# Patient Record
Sex: Female | Born: 1945 | Race: White | Hispanic: Yes | Smoking: Never smoker
Health system: Southern US, Community
[De-identification: ages and names within clinical notes are randomized; demographics above are authoritative.]

## PROBLEM LIST (undated history)

## (undated) DIAGNOSIS — M25559 Pain in unspecified hip: Secondary | ICD-10-CM

## (undated) DIAGNOSIS — G4730 Sleep apnea, unspecified: Secondary | ICD-10-CM

## (undated) DIAGNOSIS — Z972 Presence of dental prosthetic device (complete) (partial): Secondary | ICD-10-CM

## (undated) DIAGNOSIS — Z8489 Family history of other specified conditions: Secondary | ICD-10-CM

## (undated) DIAGNOSIS — G8929 Other chronic pain: Secondary | ICD-10-CM

## (undated) DIAGNOSIS — R002 Palpitations: Secondary | ICD-10-CM

## (undated) DIAGNOSIS — Z973 Presence of spectacles and contact lenses: Secondary | ICD-10-CM

## (undated) DIAGNOSIS — J45909 Unspecified asthma, uncomplicated: Secondary | ICD-10-CM

## (undated) DIAGNOSIS — I480 Paroxysmal atrial fibrillation: Secondary | ICD-10-CM

## (undated) DIAGNOSIS — E785 Hyperlipidemia, unspecified: Secondary | ICD-10-CM

## (undated) DIAGNOSIS — G629 Polyneuropathy, unspecified: Secondary | ICD-10-CM

## (undated) DIAGNOSIS — F32A Depression, unspecified: Secondary | ICD-10-CM

## (undated) DIAGNOSIS — G510 Bell's palsy: Secondary | ICD-10-CM

## (undated) DIAGNOSIS — F419 Anxiety disorder, unspecified: Secondary | ICD-10-CM

## (undated) DIAGNOSIS — M1990 Unspecified osteoarthritis, unspecified site: Secondary | ICD-10-CM

## (undated) DIAGNOSIS — I10 Essential (primary) hypertension: Secondary | ICD-10-CM

## (undated) DIAGNOSIS — T7840XA Allergy, unspecified, initial encounter: Secondary | ICD-10-CM

## (undated) DIAGNOSIS — M549 Dorsalgia, unspecified: Secondary | ICD-10-CM

## (undated) DIAGNOSIS — H269 Unspecified cataract: Secondary | ICD-10-CM

## (undated) HISTORY — DX: Palpitations: R00.2

## (undated) HISTORY — DX: Unspecified asthma, uncomplicated: J45.909

## (undated) HISTORY — DX: Other chronic pain: G89.29

## (undated) HISTORY — DX: Anxiety disorder, unspecified: F41.9

## (undated) HISTORY — PX: TOOTH EXTRACTION: SUR596

## (undated) HISTORY — DX: Essential (primary) hypertension: I10

## (undated) HISTORY — DX: Pain in unspecified hip: M25.559

## (undated) HISTORY — DX: Depression, unspecified: F32.A

## (undated) HISTORY — PX: JOINT REPLACEMENT: SHX530

## (undated) HISTORY — DX: Unspecified cataract: H26.9

## (undated) HISTORY — DX: Allergy, unspecified, initial encounter: T78.40XA

## (undated) HISTORY — DX: Unspecified osteoarthritis, unspecified site: M19.90

## (undated) HISTORY — PX: CHOLECYSTECTOMY: SHX55

## (undated) HISTORY — DX: Dorsalgia, unspecified: M54.9

## (undated) HISTORY — DX: Sleep apnea, unspecified: G47.30

## (undated) HISTORY — DX: Bell's palsy: G51.0

---

## 2008-08-12 ENCOUNTER — Emergency Department: Payer: Self-pay | Admitting: Emergency Medicine

## 2010-04-18 ENCOUNTER — Encounter: Payer: Self-pay | Admitting: Family Medicine

## 2010-04-18 ENCOUNTER — Ambulatory Visit: Payer: BC Managed Care – PPO | Admitting: Family Medicine

## 2010-04-18 DIAGNOSIS — I10 Essential (primary) hypertension: Secondary | ICD-10-CM

## 2010-04-18 DIAGNOSIS — M1990 Unspecified osteoarthritis, unspecified site: Secondary | ICD-10-CM | POA: Insufficient documentation

## 2010-04-18 DIAGNOSIS — M25569 Pain in unspecified knee: Secondary | ICD-10-CM | POA: Insufficient documentation

## 2010-04-18 DIAGNOSIS — M25669 Stiffness of unspecified knee, not elsewhere classified: Secondary | ICD-10-CM

## 2010-04-19 ENCOUNTER — Encounter: Payer: Self-pay | Admitting: Family Medicine

## 2010-04-19 ENCOUNTER — Ambulatory Visit: Payer: BC Managed Care – PPO | Admitting: Family Medicine

## 2010-04-19 ENCOUNTER — Encounter (INDEPENDENT_AMBULATORY_CARE_PROVIDER_SITE_OTHER): Payer: Self-pay | Admitting: *Deleted

## 2010-04-19 DIAGNOSIS — I10 Essential (primary) hypertension: Secondary | ICD-10-CM

## 2010-04-19 DIAGNOSIS — M1990 Unspecified osteoarthritis, unspecified site: Secondary | ICD-10-CM

## 2010-04-21 ENCOUNTER — Encounter: Payer: Self-pay | Admitting: Family Medicine

## 2010-04-21 LAB — CONVERTED CEMR LAB
ALT: 16 U/L
AST: 24 U/L
Albumin: 4.2 g/dL
Alkaline Phosphatase: 80 U/L
BUN: 23 mg/dL
Basophils Absolute: 0 K/uL
Basophils Relative: 1 %
CO2: 29 meq/L
Calcium: 9.6 mg/dL
Chloride: 100 meq/L
Creatinine, Ser: 0.75 mg/dL
Eosinophils Absolute: 0.2 K/uL
Eosinophils Relative: 4 %
Glucose, Bld: 90 mg/dL
HCT: 39.1 %
Hemoglobin: 13.1 g/dL
Lymphocytes Relative: 36 %
Lymphs Abs: 1.6 K/uL
MCHC: 33.5 g/dL
MCV: 88.5 fL
Monocytes Absolute: 0.4 K/uL
Monocytes Relative: 9 %
Neutro Abs: 2.3 K/uL
Neutrophils Relative %: 51 %
Platelets: 189 K/uL
Potassium: 4.9 meq/L
RBC: 4.42 M/uL
RDW: 13.2 %
Sodium: 137 meq/L
Total Bilirubin: 0.9 mg/dL
Total Protein: 7.1 g/dL
Vit D, 25-Hydroxy: 17 ng/mL — ABNORMAL LOW
WBC: 4.4 10*3/microliter

## 2010-04-22 ENCOUNTER — Encounter: Payer: Self-pay | Admitting: Family Medicine

## 2010-04-23 NOTE — Assessment & Plan Note (Signed)
Summary: FOLLOW UP HTN / BLOOD WORK   Vital Signs:  Patient profile:   65 year old female Height:      62 inches Weight:      166 pounds BMI:     30.47 Pulse rate:   64 / minute BP sitting:   136 / 82  (right arm) Cuff size:   regular  Vitals Entered By: Haze Boyden, CMA (April 19, 2010 11:50 AM) History of Present Illness History from: patient Reason for visit: follow up of multiple problems Chief Complaint: Follow Up for BP and to get blood work History of Present Illness: The patient presented today because I had asked her to come back in today to have her blood pressure rechecked and to have her blood work done.  The patient says that she has been taking her BP meds and she says that she has been tolerating them well.  No side effects so far. She would like to have her flu vaccine and she will need to get her TDAP vaccine as well.  She says that she is having lower back pain and neck pain.  She is otherwise not having any other symptoms.  No CP, no SOB, no weakness in extremities, no rash.      Current Medications (verified): 1)  Lisinopril-Hydrochlorothiazide 10-12.5 Mg Tabs (Lisinopril-Hydrochlorothiazide) .... Take 1 By Mouth Daily For Blood Pressure 2)  Daily Multiple Vitamins  Tabs (Multiple Vitamin) .... Take One Tablet By Mouth Once Daily 3)  Vitamin C 500 Mg Tabs (Ascorbic Acid) .... Take One Tablet By Mouth Once Daily  Allergies (verified): 1)  ! * Fish  Past History:  Family History: Last updated: 04/18/2010 Hypertension present in both parents and diabetes mellitus. Her father is deceased. She has a brother dad is deceased from diabetes mellitus and a sister also deceased from diabetes mellitus.  Social History: Last updated: 04/18/2010 The patient reports that she does not use tobacco, alcohol or recreational drugs. She reports that she cares for her daughter and granddaughter in [place].   Past Medical History: Reviewed history from  04/18/2010 and no changes required. Unremarkable per patient  Past Surgical History: Reviewed history from 04/18/2010 and no changes required. Denies surgical history  Family History: Reviewed history from 04/18/2010 and no changes required. Hypertension present in both parents and diabetes mellitus. Her father is deceased. She has a brother dad is deceased from diabetes mellitus and a sister also deceased from diabetes mellitus.  Social History: Reviewed history from 04/18/2010 and no changes required. The patient reports that she does not use tobacco, alcohol or recreational drugs. She reports that she cares for her daughter and granddaughter in [place].   Review of Systems General:  Denies chills, fatigue, fever, loss of appetite, malaise, sleep disorder, sweats, weakness, and weight loss. Eyes:  Denies blurring, discharge, double vision, eye irritation, eye pain, halos, itching, light sensitivity, red eye, vision loss-1 eye, and vision loss-both eyes. ENT:  Denies decreased hearing, difficulty swallowing, ear discharge, earache, hoarseness, nasal congestion, nosebleeds, postnasal drainage, ringing in ears, sinus pressure, and sore throat. CV:  Denies bluish discoloration of lips or nails, chest pain or discomfort, difficulty breathing at night, difficulty breathing while lying down, fainting, fatigue, leg cramps with exertion, lightheadness, near fainting, palpitations, shortness of breath with exertion, swelling of feet, swelling of hands, and weight gain. Resp:  Denies chest discomfort, chest pain with inspiration, cough, coughing up blood, excessive snoring, hypersomnolence, morning headaches, pleuritic, shortness of  breath, sputum productive, and wheezing. GI:  Denies abdominal pain, bloody stools, change in bowel habits, constipation, dark tarry stools, diarrhea, excessive appetite, gas, hemorrhoids, indigestion, loss of appetite, nausea, vomiting, vomiting blood, and  yellowish skin color. GU:  Denies abnormal vaginal bleeding, decreased libido, discharge, dysuria, genital sores, hematuria, incontinence, nocturia, urinary frequency, and urinary hesitancy. MS:  Complains of low back pain and stiffness; denies joint pain, joint redness, joint swelling, loss of strength, mid back pain, muscle aches, muscle , cramps, muscle weakness, and thoracic pain. Neuro:  Denies brief paralysis, difficulty with concentration, disturbances in coordination, falling down, headaches, inability to speak, memory loss, numbness, poor balance, seizures, sensation of room spinning, tingling, tremors, visual disturbances, and weakness. Psych:  Denies alternate hallucination ( auditory/visual), anxiety, depression, easily angered, easily tearful, irritability, mental problems, panic attacks, sense of great danger, suicidal thoughts/plans, thoughts of violence, unusual visions or sounds, and thoughts /plans of harming others.  Physical Exam  General:  Well-developed,well-nourished,in no acute distress; alert,appropriate and cooperative throughout examination Head:  Normocephalic and atraumatic without obvious abnormalities. No apparent alopecia or balding. Eyes:  No corneal or conjunctival inflammation noted. EOMI. Perrla. Funduscopic exam benign, without hemorrhages, exudates or papilledema. Vision grossly normal. Nose:  External nasal examination shows no deformity or inflammation. Nasal mucosa are pink and moist without lesions or exudates. Mouth:  Oral mucosa and oropharynx without lesions or exudates.  Teeth in good repair. Neck:  No deformities, masses, or tenderness noted.  Thoracic Kyphosis Lungs:  Normal respiratory effort, chest expands symmetrically. Lungs are clear to auscultation, no crackles or wheezes. Heart:  Grade 1/6 systolic ejection murmur.  normal rate, regular rhythm, no gallop, no rub, no JVD, and Grade   /6 systolic ejection murmur.   Abdomen:  Bowel sounds  positive,abdomen soft and non-tender without masses, organomegaly or hernias noted. Msk:  Thoracic Kyphosis Pulses:  R and L carotid,radial,femoral,dorsalis pedis and posterior tibial pulses are full and equal bilaterally Extremities:  No clubbing, cyanosis, edema, or deformity noted with normal full range of motion of all joints.   Neurologic:  No cranial nerve deficits noted. Station and gait are normal. Plantar reflexes are down-going bilaterally. DTRs are symmetrical throughout. Sensory, motor and coordinative functions appear intact. Psych:  Cognition and judgment appear intact. Alert and cooperative with normal attention span and concentration. No apparent delusions, illusions, hallucinations   Impression & Recommendations:  Problem # 1:  UNSPECIFIED ESSENTIAL HYPERTENSION (ICD-401.9)  Her updated medication list for this problem includes:    Lisinopril-hydrochlorothiazide 10-12.5 Mg Tabs (Lisinopril-hydrochlorothiazide) .Marland Kitchen... Take 1 by mouth daily for blood pressure  BP is improving on medications.  The patient will get labs drawn today including BMP, GFR (est), CBC, Lipid Panel, UA DIP  Problem # 2:  Health Maintenance Will arrange for bone density study Will arrange for cardiology referral TdAP and FLU vaccine given today. Will follow up on the results.    Rodney Langton, MD, CDE, FAAFP   Complete Medication List: 1)  Lisinopril-hydrochlorothiazide 10-12.5 Mg Tabs (Lisinopril-hydrochlorothiazide) .... Take 1 by mouth daily for blood pressure 2)  Daily Multiple Vitamins Tabs (Multiple vitamin) .... Take one tablet by mouth once daily 3)  Vitamin C 500 Mg Tabs (Ascorbic acid) .... Take one tablet by mouth once daily  Other Orders: Tdap => 50yrs IM (04540) Admin 1st Vaccine (98119)  Patient Instructions: 1)  Check your Blood Pressure regularly. If it is above: 140/90 you should make an appointment. 2)  Return in 2 months for a  recheck.  3)  The patient was informed that there  is no on-call provider or services available at this clinic during off-hours (when the clinic is closed).  If the patient developed a problem or concern that required immediate attention, the patient was advised to go the the nearest available urgent care or emergency department for medical care.  The patient verbalized understanding.    4)  The risks, benefits and possible side effects were clearly explained and discussed with the patient.  The patient verbalized clear understanding.  The patient was given instructions to return if symptoms don't improve, worsen or new changes develop.  If it is not during clinic hours and the patient cannot get back to this clinic then the patient was told to seek medical care at an available urgent care or emergency department.  The patient verbalized understanding.   5)  Return or go to the ER if no improvement or symptoms getting worse.     Orders Added: 1)  Tdap => 26yrs IM [90715] 2)  Admin 1st Vaccine [90471]   Immunizations Administered:  Tetanus Vaccine:    Vaccine Type: Tdap    Site: right deltoid    Mfr: Sanofi Pasteur    Dose: 0.5 ml    Route: IM    Given by: Haze Boyden, CMA    Exp. Date: 06/14/2011    Lot #: [id number]  Influenza Vaccine:    Vaccine Type: FLULAVAL    Site: left deltoid    Mfr: GlaxoSmithKline    Dose: 0.5 ml    Route: IM    Given by: Haze Boyden, CMA    Exp. Date: 09/13/2010    Lot #: [id number]   Immunizations Administered:  Tetanus Vaccine:    Vaccine Type: Tdap    Site: right deltoid    Mfr: Sanofi Pasteur    Dose: 0.5 ml    Route: IM    Given by: Haze Boyden, CMA    Exp. Date: 06/14/2011    Lot #: [id number]  Influenza Vaccine:    Vaccine Type: FLULAVAL    Site: left deltoid    Mfr: GlaxoSmithKline    Dose: 0.5 ml    Route: IM    Given by: Haze Boyden, CMA    Exp. Date: 09/13/2010    Lot #: [id number]  Flu Vaccine Consent Questions:    Do you have a history of severe allergic reactions  to this vaccine? no    Any prior history of allergic reactions to egg and/or gelatin? no    Do you have a sensitivity to the preservative Thimersol? no    Do you have a past history of Guillan-Barre Syndrome? no    Do you currently have an acute febrile illness? no    Have you ever had a severe reaction to latex? no    Vaccine information given and explained to patient? yes    Are you currently pregnant? no   See Scanned Results of UA DIP. Rodney Langton, MD, CDE, [employer]

## 2010-04-23 NOTE — Assessment & Plan Note (Signed)
Summary: HIGH BLOOD PRESSURE/EVM   Vital Signs:  Patient profile:   65 year old female Weight:      166 pounds O2 Sat:      98 % on Room air Temp:     98.7 degrees F oral Pulse rate:   67 / minute Pulse rhythm:   regular Resp:     16 per minute BP sitting:   176 / 84  O2 Flow:  Room air   History of Present Illness: The patient presented today because she was told earlier today that her blood pressure was too high for her to have a scheduled dental procedure done.  She was told that her initial BP was 191/86.  She reported that she was told that she needed to see a physician to be evaluated. The patient says that she had no symptoms of HBP.  She denies headache, chest pain, shortness of breath, swelling in the extremities and fatigue. The patient reports that she has been feeling fairly well with the exception of arthritis in the left knee. She reports that she felt like she had some tingling in a left first finger but no other symptoms. The patient reports that she has not seen a physician in over 7 years. She reports that she has been caring for her daughter and granddaughter. The patient reports that she does not see doctors regularly. The patient says she just does not like taking medications.  The Patient Reports That She Has Not Had Any Blood Work Done in Many Years. This  Past History:  Past Medical History: Unremarkable per patient  Past Surgical History: Denies surgical history  Family History: Hypertension present in both parents and diabetes mellitus. Her father is deceased. She has a brother dad is deceased from diabetes mellitus and a sister also deceased from diabetes mellitus.  Social History: The patient reports that she does not use tobacco, alcohol or recreational drugs. She reports that she cares for her daughter and granddaughter in [place].   Review of Systems General:  Denies chills, fatigue, fever, loss of appetite, malaise, sleep disorder,  sweats, weakness, and weight loss. Eyes:  Denies blurring, discharge, double vision, eye irritation, eye pain, halos, itching, light sensitivity, red eye, vision loss-1 eye, and vision loss-both eyes. ENT:  Denies decreased hearing, difficulty swallowing, ear discharge, earache, hoarseness, nasal congestion, nosebleeds, postnasal drainage, ringing in ears, sinus pressure, and sore throat. CV:  Denies bluish discoloration of lips or nails, chest pain or discomfort, difficulty breathing at night, difficulty breathing while lying down, fainting, fatigue, leg cramps with exertion, lightheadness, near fainting, palpitations, shortness of breath with exertion, swelling of feet, swelling of hands, and weight gain. Resp:  Denies chest discomfort, chest pain with inspiration, cough, coughing up blood, excessive snoring, hypersomnolence, morning headaches, pleuritic, shortness of breath, sputum productive, and wheezing. GI:  Denies abdominal pain, bloody stools, change in bowel habits, constipation, dark tarry stools, diarrhea, excessive appetite, gas, hemorrhoids, indigestion, loss of appetite, nausea, vomiting, vomiting blood, and yellowish skin color. GU:  Denies abnormal vaginal bleeding, decreased libido, discharge, dysuria, genital sores, hematuria, incontinence, nocturia, urinary frequency, and urinary hesitancy. MS:  Complains of joint pain, muscle weakness, and stiffness; denies joint redness, joint swelling, loss of strength, low back pain, mid back pain, muscle aches, muscle , cramps, and thoracic pain; arthritis. Neuro:  Complains of numbness and tingling; denies brief paralysis, difficulty with concentration, disturbances in coordination, falling down, headaches, inability to speak, memory loss, poor balance, seizures, sensation  of room spinning, tremors, visual disturbances, and weakness. Psych:  Denies alternate hallucination ( auditory/visual), anxiety, depression, easily angered, easily tearful,  irritability, mental problems, panic attacks, sense of great danger, suicidal thoughts/plans, thoughts of violence, unusual visions or sounds, and thoughts /plans of harming others. Allergy:  Denies hives or rash, itching eyes, persistent infections, seasonal allergies, and sneezing.  Physical Exam  General:  Well-developed,well-nourished,in no acute distress; alert,appropriate and cooperative throughout examination Head:  Normocephalic and atraumatic without obvious abnormalities. No apparent alopecia or balding. Eyes:  No corneal or conjunctival inflammation noted. EOMI. Perrla. Funduscopic exam benign, without hemorrhages, exudates or papilledema. Vision grossly normal. Ears:  External ear exam shows no significant lesions or deformities.  Otoscopic examination reveals clear canals, tympanic membranes are intact bilaterally without bulging, retraction, inflammation or discharge. Hearing is grossly normal bilaterally. Nose:  External nasal examination shows no deformity or inflammation. Nasal mucosa are pink and moist without lesions or exudates. Mouth:  Oral mucosa and oropharynx without lesions or exudates.  Teeth in good repair. Neck:  No deformities, masses, or tenderness noted. Lungs:  Normal respiratory effort, chest expands symmetrically. Lungs are clear to auscultation, no crackles or wheezes. Heart:  Grade 1/6 systolic ejection murmur.  normal rate, regular rhythm, no gallop, no rub, no JVD, and Grade   /6 systolic ejection murmur.   Abdomen:  Bowel sounds positive,abdomen soft and non-tender without masses, organomegaly or hernias noted. Msk:  No deformity or scoliosis noted of thoracic or lumbar spine.  joint swelling and enlarged MCP joints.  crepitus in the left knee and small effusion noted. Pulses:  R and L carotid,radial,femoral,dorsalis pedis and posterior tibial pulses are full and equal bilaterally Extremities:  No clubbing, cyanosis, edema, or deformity noted with normal full  range of motion of all joints.   Neurologic:  No cranial nerve deficits noted. Station and gait are normal. Plantar reflexes are down-going bilaterally. DTRs are symmetrical throughout. Sensory, motor and coordinative functions appear intact. Psych:  Cognition and judgment appear intact. Alert and cooperative with normal attention span and concentration. No apparent delusions, illusions, hallucinations   Impression & Recommendations:  Problem # 1:  UNSPECIFIED ESSENTIAL HYPERTENSION (ICD-401.9)  The patient received clonidine 0.1 mg by mouth in the office today.  The patient was told that she is going to have to try taking a blood pressure medication on a regular basis. The patient will also need to have some blood work done to evaluate her kidney function and also to evaluate her liver function tests. The patient verbalized understanding. In addition, I told the patient that I would like to try a blood pressure medication called Zestoretic at a low dose and over time but will increase the dose as needed to control the blood pressure as tolerated. The patient verbalized understanding. The patient was given a prescription for Zestoretic 10/12.5 mg with instructions to take one p.o. daily for blood pressure. I asked the patient to take one at the same time every day. Also, I recommended that the patient watch out for signs of intolerance including swollen lips and cough and other intolerances. The patient verbalized understanding. I asked the patient to come into the office tomorrow to have her blood work drawn we will have a phlebotomist in the office tomorrow and the patient verbalized understanding. At that time we will order a metabolic profile urinalysis dip and vitamin D and CBC and followup on those results.  Her updated medication list for this problem includes:    Lisinopril-hydrochlorothiazide  10-12.5 Mg Tabs (Lisinopril-hydrochlorothiazide) .Marland Kitchen... Take 1 by mouth daily for blood  pressure  Problem # 2:  KNEE PAIN, LEFT (ICD-719.46) Recommended that the patient take tylenol OTC and glucosamine chondroitin.  Complete Medication List: 1)  Lisinopril-hydrochlorothiazide 10-12.5 Mg Tabs (Lisinopril-hydrochlorothiazide) .... Take 1 by mouth daily for blood pressure  Patient Instructions: 1)  Limit your Sodium (Salt) to less than 2 grams a day(slightly less than 1/2 a teaspoon) to prevent fluid retention, swelling, or worsening of symptoms. 2)  It is important that you exercise regularly at least 20 minutes 5 times a week. If you develop chest pain, have severe difficulty breathing, or feel very tired , stop exercising immediately and seek medical attention. 3)  Check your Blood Pressure regularly. If it is above:140/90 you should make an appointment. 4)  Please Return Tomorrow to have your blood work drawn we are Open from 11 AM to 5 PM. 5)  The risks, benefits and possible side effects were clearly explained and discussed with the patient.  The patient verbalized clear understanding.  The patient was given instructions to return if symptoms don't improve, worsen or new changes develop.  If it is not during clinic hours and the patient cannot get back to this clinic then the patient was told to seek medical care at an available urgent care or emergency department.  The patient verbalized understanding.   6)  Return or go to the ER if no improvement or symptoms getting worse.   7)  The patient was informed that there is no on-call provider or services available at this clinic during off-hours (when the clinic is closed).  If the patient developed a problem or concern that required immediate attention, the patient was advised to go the the nearest available urgent care or emergency department for medical care.  The patient verbalized understanding.    Prescriptions: LISINOPRIL-HYDROCHLOROTHIAZIDE 10-12.5 MG TABS (LISINOPRIL-HYDROCHLOROTHIAZIDE) take 1 by mouth daily for blood  pressure  #30 x 2   Entered and Authorized by:   Standley Dakins MD   Signed by:   Standley Dakins MD on 04/18/2010   Method used:   Handwritten   RxID:   [id number]    Medication Administration  Medication # 1:    Medication: Clonidine 0.1mg  tab    Diagnosis: UNSPECIFIED ESSENTIAL HYPERTENSION (ICD-401.9)    Dose: 1 tablet    Route: po

## 2010-04-23 NOTE — Letter (Signed)
Summary: Generic Letter  [facility]  [address]   [place], [place] [address]   Phone: [phone]  Fax: [phone]    04/19/2010  Gina Frazier [address] [place],, [place]  [address]  [place]  To whom it may Concern,     [name] was seen at our clinic on 04/18/10 with elevated blood pressures.  She was started on blood pressure medications and seen in follow up on 04/19/10.  The patient's blood pressure on 04/19/10 was 132/69.  The patient had blood work done in the office today (04/19/10) and will be referred to a cardiologist for further evaluation and treatment.                Sincerely,   Haze Boyden, CMA  Rodney Langton, MD

## 2010-05-01 ENCOUNTER — Ambulatory Visit (INDEPENDENT_AMBULATORY_CARE_PROVIDER_SITE_OTHER): Payer: BC Managed Care – PPO | Admitting: Cardiovascular Disease

## 2010-05-01 ENCOUNTER — Encounter: Payer: Self-pay | Admitting: Cardiovascular Disease

## 2010-05-01 DIAGNOSIS — I10 Essential (primary) hypertension: Secondary | ICD-10-CM

## 2010-05-01 DIAGNOSIS — R0602 Shortness of breath: Secondary | ICD-10-CM | POA: Insufficient documentation

## 2010-05-01 NOTE — Letter (Signed)
Summary: Generic Letter  [facility]  [address]   [place], [place] [address]   Phone: [phone]  Fax: [phone]    04/22/2010  Ceniya RODRIQUEZ [address] [place],, [place]  [address]  [place]  Dear Ms. RODRIQUEZ, This letter is to inform you of your up coming appt. with [facility] [place] is on Feb. 16th @ 2:45pm. Also we have inclosed a letter for you to take to [facility] on the same day if you wish, To have a Bone Density screening done. You can go into [facility] anytime between 4:40-5:30 M-F for this screening exam. If you have a problem with the Appts? Please call [facility] at [phone].           Sincerely,   Levonne Spiller EMT-P [facility]

## 2010-05-01 NOTE — Letter (Signed)
Summary: Generic Letter  [facility]  [address]   [place], [place] [address]   Phone: [phone]  Fax: [phone]    04/22/2010  Gina Frazier [address] [place],, [place]  [address]  [place]  Dear [name],           Sincerely,   Levonne Spiller EMT-P

## 2010-05-01 NOTE — Letter (Signed)
Summary: [facility] order  [facility] order   Imported By: Dorna Leitz 04/22/2010 18:51:58  _____________________________________________________________________  External Attachment:    Type:   Image     Comment:   External Document

## 2010-05-01 NOTE — Letter (Signed)
Summary: Generic Letter  [facility]  [address]   [place], [place] [address]   Phone: [phone]  Fax: [phone]    04/22/2010  Gina Frazier [address] [place],, [place]  [address]  [place]  Dear [name], You lab results came back with normal results, Except for your vitamin D was low. Dr. Laural Benes wants you to take a Vitamin D Rx and then take Vitamin D over the counter, 1000 International Units daily. The Rx will be at you drug store to be picked up by you. If you have any Questions, Feel free to give [place] a call at the clinic.          Sincerely,   Levonne Spiller EMT-P

## 2010-05-01 NOTE — Letter (Signed)
Summary: Dental letter  Dental letter   Imported By: Dorna Leitz 04/21/2010 16:53:26  _____________________________________________________________________  External Attachment:    Type:   Image     Comment:   External Document

## 2010-05-07 NOTE — Assessment & Plan Note (Signed)
Summary: Elevated BP and Dizziness/AMD   Visit Type:  Initial Consult Primary Provider:  Dr. Laural Benes- [facility].  CC:  c/o high blood pressure. denies chest pain and palpitations and SOB.Marland Kitchen  History of Present Illness: Ms. Gina Frazier is a very pleasant 65 year old woman with history of hypertension, recently seen by Dr. Laural Benes for hypertension at the urgent care. She appears to establish care.  She reports that she was having a dental cleaning and prior to any intervention, was noted to have a blood pressure of 190 systolic. The cleaning was stopped. she was started on lisinopril HCT 10/12.5 mg daily. In followup her blood pressure was significantly improved.  She has been monitoring her blood pressure at home and reports systolic pressures in the 130s over 70s. She denies any chest pain, shortness of breath or edema.  She reports her mother had diabetes, hypertension, hyperlipidemia  EKG shows normal sinus rhythm with rate of 69 beats a minute, no significant ST or T wave changes  Current Medications (verified): 1)  Lisinopril-Hydrochlorothiazide 10-12.5 Mg Tabs (Lisinopril-Hydrochlorothiazide) .... Take 1 By Mouth Daily For Blood Pressure 2)  Daily Multiple Vitamins  Tabs (Multiple Vitamin) .... Take One Tablet By Mouth Once Daily 3)  Vitamin C 500 Mg Tabs (Ascorbic Acid) .... Take One Tablet By Mouth Once Daily  Allergies (verified): 1)  ! * Fish  Past History:  Past Medical History: Last updated: 04/18/2010 Unremarkable per patient  Past Surgical History: Last updated: 04/18/2010 Denies surgical history  Family History: Last updated: 04/18/2010 Hypertension present in both parents and diabetes mellitus. Her father is deceased. She has a brother dad is deceased from diabetes mellitus and a sister also deceased from diabetes mellitus.  Social History: Last updated: 04/18/2010 The patient reports that she does not use tobacco, alcohol or recreational  drugs. She reports that she cares for her daughter and granddaughter in [place].   Review of Systems  The patient denies fever, weight loss, weight gain, vision loss, decreased hearing, hoarseness, chest pain, syncope, dyspnea on exertion, peripheral edema, prolonged cough, abdominal pain, incontinence, muscle weakness, depression, and enlarged lymph nodes.    Vital Signs:  Patient profile:   65 year old female Height:      62 inches Weight:      161 pounds BMI:     29.55 Pulse rate:   69 / minute BP sitting:   155 / 78  (left arm) Cuff size:   regular  Vitals Entered By: Lysbeth Galas CMA (May 01, 2010 2:54 PM)   Physical Exam  General:  Well developed, well nourished, in no acute distress. Head:  normocephalic and atraumatic Neck:  Neck supple, no JVD. No masses, thyromegaly or abnormal cervical nodes. Lungs:  Clear bilaterally to auscultation and percussion. Heart:  Non-displaced PMI, chest non-tender; regular rate and rhythm, S1, S2 without murmurs, rubs or gallops. Carotid upstroke normal, no bruit. Normal abdominal aortic size, no bruits. Pedals normal pulses. No edema, no varicosities. Abdomen:  Bowel sounds positive; abdomen soft and non-tender without masses Msk:  Back normal, normal gait. Muscle strength and tone normal. Pulses:  pulses normal in all 4 extremities Extremities:  No clubbing or cyanosis. Neurologic:  Alert and oriented x 3. Skin:  Intact without lesions or rashes. Psych:  Normal affect.   Impression & Recommendations:  Problem # 1:  UNSPECIFIED ESSENTIAL HYPERTENSION (ICD-401.9) blood pressure today is relatively well-controlled with repeat blood pressure measured at 135/75. We have made no further medication  changes and have encouraged her to start exercising, watching her diet more.  As her blood pressure is currently well controlled on a single medication, we will not proceed with any further testing such as renal  ultrasound.  Her updated medication list for this problem includes:    Lisinopril-hydrochlorothiazide 10-12.5 Mg Tabs (Lisinopril-hydrochlorothiazide) .Marland Kitchen... Take 1 by mouth daily for blood pressure  Problem # 2:  SHORTNESS OF BREATH (ICD-786.05) Today she denies any significant symptoms of chest pain or shortness of breath. No further workup needed. Essentially benign exam with no murmurs on auscultation.  Her updated medication list for this problem includes:    Lisinopril-hydrochlorothiazide 10-12.5 Mg Tabs (Lisinopril-hydrochlorothiazide) .Marland Kitchen... Take 1 by mouth daily for blood pressure  Orders: EKG w/ Interpretation (93000)  Problem # 3:  PREVENTIVE HEALTH CARE (ICD-V70.0) We did mention that on her next lab draw with Dr. Laural Benes, she should have a cholesterol panel for routine screening. She reports her mother had hyperlipidemia.  Her vitamin D is low and she is currently on supplements. Per her report, bone scan has been ordered.

## 2010-06-03 ENCOUNTER — Encounter: Payer: Self-pay | Admitting: Family Medicine

## 2010-06-12 NOTE — Medication Information (Signed)
Summary: Radiologist report  Radiologist report   Imported By: Eugenio Hoes 06/06/2010 13:46:39  _____________________________________________________________________  External Attachment:    Type:   Image     Comment:   External Document

## 2010-07-21 ENCOUNTER — Encounter: Payer: Self-pay | Admitting: Internal Medicine

## 2010-07-21 ENCOUNTER — Ambulatory Visit (INDEPENDENT_AMBULATORY_CARE_PROVIDER_SITE_OTHER): Payer: Medicare Other | Admitting: Internal Medicine

## 2010-07-21 DIAGNOSIS — G8929 Other chronic pain: Secondary | ICD-10-CM

## 2010-07-21 DIAGNOSIS — Z124 Encounter for screening for malignant neoplasm of cervix: Secondary | ICD-10-CM

## 2010-07-21 DIAGNOSIS — Z0000 Encounter for general adult medical examination without abnormal findings: Secondary | ICD-10-CM

## 2010-07-21 DIAGNOSIS — M549 Dorsalgia, unspecified: Secondary | ICD-10-CM

## 2010-07-21 DIAGNOSIS — Z1211 Encounter for screening for malignant neoplasm of colon: Secondary | ICD-10-CM

## 2010-07-21 DIAGNOSIS — Z23 Encounter for immunization: Secondary | ICD-10-CM

## 2010-07-21 DIAGNOSIS — Z79899 Other long term (current) drug therapy: Secondary | ICD-10-CM

## 2010-07-21 DIAGNOSIS — E559 Vitamin D deficiency, unspecified: Secondary | ICD-10-CM

## 2010-07-21 DIAGNOSIS — I10 Essential (primary) hypertension: Secondary | ICD-10-CM

## 2010-07-21 LAB — CBC WITH DIFFERENTIAL/PLATELET
Basophils Absolute: 0 10*3/uL (ref 0.0–0.1)
Eosinophils Relative: 2.8 % (ref 0.0–5.0)
MCV: 91.8 fl (ref 78.0–100.0)
Monocytes Absolute: 0.4 10*3/uL (ref 0.1–1.0)
Monocytes Relative: 7.1 % (ref 3.0–12.0)
Neutrophils Relative %: 62.1 % (ref 43.0–77.0)
Platelets: 195 10*3/uL (ref 150.0–400.0)
RDW: 14.2 % (ref 11.5–14.6)
WBC: 5.9 10*3/uL (ref 4.5–10.5)

## 2010-07-21 LAB — BASIC METABOLIC PANEL
Calcium: 9 mg/dL (ref 8.4–10.5)
GFR: 104 mL/min (ref >60.00)
Glucose, Bld: 84 mg/dL (ref 70–99)
Potassium: 4.5 mEq/L (ref 3.5–5.1)
Sodium: 139 mEq/L (ref 135–145)

## 2010-07-21 LAB — LIPID PANEL
HDL: 62.4 mg/dL (ref >39.00)
Total CHOL/HDL Ratio: 3
Triglycerides: 65 mg/dL (ref 0.0–149.0)
VLDL: 13 mg/dL (ref 0.0–40.0)

## 2010-07-21 MED ORDER — LISINOPRIL-HYDROCHLOROTHIAZIDE 10-12.5 MG PO TABS: 1.0000 | ORAL_TABLET | Freq: Every day | ORAL | Status: DC

## 2010-07-21 MED ORDER — PNEUMOCOCCAL VAC POLYVALENT 25 MCG/0.5ML IJ INJ: 0.5000 mL | INJECTION | Freq: Once | INTRAMUSCULAR | Status: DC

## 2010-07-22 LAB — VITAMIN D 25 HYDROXY (VIT D DEFICIENCY, FRACTURES): Vit D, 25-Hydroxy: 43 ng/mL (ref 30–89)

## 2010-07-25 ENCOUNTER — Telehealth: Payer: Self-pay

## 2010-07-25 ENCOUNTER — Ambulatory Visit (INDEPENDENT_AMBULATORY_CARE_PROVIDER_SITE_OTHER)
Admission: RE | Admit: 2010-07-25 | Discharge: 2010-07-25 | Disposition: A | Discharge status: Home / Self Care | Payer: BC Managed Care – PPO | Source: Ambulatory Visit | Attending: Internal Medicine | Admitting: Internal Medicine

## 2010-07-25 DIAGNOSIS — M549 Dorsalgia, unspecified: Secondary | ICD-10-CM

## 2010-07-25 DIAGNOSIS — G8929 Other chronic pain: Secondary | ICD-10-CM

## 2010-07-25 NOTE — Telephone Encounter (Signed)
Pt aware. Pt's daughter to call back to make 5 month follow up appt

## 2010-07-25 NOTE — Telephone Encounter (Signed)
Pt aware. Pt's daughter to call for 5 month follow up appt

## 2010-07-25 NOTE — Telephone Encounter (Signed)
Message copied by Kyung Rudd on Fri Jul 25, 2010  4:21 PM ------      Message from: Letitia Libra, Maisie Fus      Created: Fri Jul 25, 2010  8:57 AM       Labs nl except minimally anemic. Sugar, chol and vit d nl. Can recheck hgb at future appt

## 2010-07-25 NOTE — Progress Notes (Signed)
Pt aware.

## 2010-07-25 NOTE — Telephone Encounter (Signed)
Left message for pt to call back  °

## 2010-07-25 NOTE — Telephone Encounter (Signed)
Message copied by Kyung Rudd on Fri Jul 25, 2010  3:10 PM ------      Message from: Letitia Libra, Maisie Fus      Created: Fri Jul 25, 2010  8:57 AM       Labs nl except minimally anemic. Sugar, chol and vit d nl. Can recheck hgb at future appt

## 2010-08-03 NOTE — Assessment & Plan Note (Signed)
Obtain x-ray of lumbar sacral spine

## 2010-08-03 NOTE — Assessment & Plan Note (Signed)
Mildly suboptimal but mildly anxious about the visit. Asymptomatic. Continue current regimen. Monitor blood pressure as an outpatient and followup in clinic as scheduled. Obtain CBC Chem-7 TSH and fasting lipid profile

## 2010-08-03 NOTE — Progress Notes (Signed)
  Subjective:    Patient ID: Gina Frazier, female    DOB: 03-08-1946, 65 y.o.   MRN: [id number]  HPI patient presents to clinic to establish primary medical care. Has chronic back pain lumbar area without radicular leg pain numbness tingling or weakness. No injury or trauma.pain is worsened with lifting or change in posture and position. States has history of murmur and underwent echocardiogram approximately 2012. Has not undergone screening colonoscopy and has not had recent mammogram. Tetanus up-to-date 2012. History of vitamin D deficiency status post prescription for vitamin D replacement. No history of bony fracture and believes underwent normal bone density in February 2012. Has not undergone recent cervical cancer screening and is interested in obtaining gynecologist. Does have history of hypertension and blood pressure is mildly elevated. No other complaints  Reviewed past medical history, past surgical history, medications, allergies, social history and family history    Review of Systems  Constitutional: Negative for fever, chills and unexpected weight change.  HENT: Negative for congestion, rhinorrhea and neck pain.   Eyes: Negative for pain and redness.  Respiratory: Negative for cough, shortness of breath and wheezing.   Cardiovascular: Negative for chest pain.  Gastrointestinal: Negative for constipation, blood in stool and abdominal distention.  Musculoskeletal: Positive for back pain. Negative for gait problem.  All other systems reviewed and are negative.       Objective:   Physical Exam    Physical Exam  Vitals reviewed. Constitutional:  appears well-developed and well-nourished. No distress.  HENT:  Head: Normocephalic and atraumatic.  Right Ear: Tympanic membrane, external ear and ear canal normal.  Left Ear: Tympanic membrane, external ear and ear canal normal.  Nose: Nose normal.  Mouth/Throat: Oropharynx is clear and moist. No oropharyngeal exudate.  Eyes:  Conjunctivae and EOM are normal. Pupils are equal, round, and reactive to light. Right eye exhibits no discharge. Left eye exhibits no discharge. No scleral icterus.  Neck: Neck supple. No thyromegaly present.  Cardiovascular: Normal rate, regular rhythm and normal heart sounds.  Exam reveals no gallop and no friction rub.   No murmur heard. Pulmonary/Chest: Effort normal and breath sounds normal. No respiratory distress.  has no wheezes.  has no rales.  Lymphadenopathy:   no cervical adenopathy.  Neurological:  is alert.  Skin: Skin is warm and dry.  not diaphoretic.  Psychiatric: normal mood and affect.  Musculoskeletal: No midline lumbar sacral tenderness or bony abnormality. No obvious paraspinal muscle spasm. Able to weight-bear and ambulate without difficulty.    Assessment & Plan:

## 2010-08-03 NOTE — Assessment & Plan Note (Signed)
Status post replacement therapy. Repeat vitamin D level

## 2010-10-01 ENCOUNTER — Encounter: Payer: Self-pay | Admitting: Gastroenterology

## 2010-10-07 ENCOUNTER — Encounter: Payer: Self-pay | Admitting: Cardiovascular Disease

## 2010-12-30 ENCOUNTER — Encounter: Payer: Self-pay | Admitting: Cardiovascular Disease

## 2010-12-30 ENCOUNTER — Ambulatory Visit (INDEPENDENT_AMBULATORY_CARE_PROVIDER_SITE_OTHER): Payer: BC Managed Care – PPO | Admitting: Cardiovascular Disease

## 2010-12-30 ENCOUNTER — Emergency Department: Payer: Self-pay | Admitting: Emergency Medicine

## 2010-12-30 VITALS — BP 150/80 | HR 66 | Resp 16 | Ht 63.0 in | Wt 153.0 lb

## 2010-12-30 DIAGNOSIS — I10 Essential (primary) hypertension: Secondary | ICD-10-CM

## 2010-12-30 DIAGNOSIS — F410 Panic disorder [episodic paroxysmal anxiety] without agoraphobia: Secondary | ICD-10-CM

## 2010-12-30 DIAGNOSIS — Z0000 Encounter for general adult medical examination without abnormal findings: Secondary | ICD-10-CM

## 2010-12-30 DIAGNOSIS — R9431 Abnormal electrocardiogram [ECG] [EKG]: Secondary | ICD-10-CM

## 2010-12-30 DIAGNOSIS — F419 Anxiety disorder, unspecified: Secondary | ICD-10-CM | POA: Insufficient documentation

## 2010-12-30 DIAGNOSIS — Z01810 Encounter for preprocedural cardiovascular examination: Secondary | ICD-10-CM | POA: Insufficient documentation

## 2010-12-30 MED ORDER — NITROGLYCERIN 0.4 MG SL SUBL: 0.4000 mg | SUBLINGUAL_TABLET | SUBLINGUAL | Status: DC | PRN

## 2010-12-30 NOTE — Patient Instructions (Signed)
You are doing well. We have called in nitroglycerin for high blood pressure  Take nitro under the tongue for blood pressure greater than 170 Please call us if you have new issues that need to be addressed before your next appt.  The office will contact you for a follow up Appt. In 6 months

## 2010-12-30 NOTE — Progress Notes (Signed)
Patient ID: Gina Frazier, female    DOB: [birth year], 65 y.o.   MRN: [id number]  HPI Comments: Gina Frazier is a very pleasant 65 year old woman with history of hypertension,  Last seen in February 2012 4 and episode of severe hypertension while at the dentist.    She reports that she was having a dental cleaning and prior to any intervention, was noted to have a blood pressure of 190 systolic. The cleaning was stopped.  she was started on lisinopril HCT 10/12.5 mg daily. In followup her blood pressure was significantly improved.  She reports that today she was at the dentist again about to have teeth pulled. She had a numbing placed on her gums, IV Novocain. She had her blood pressure checked and it was noted to be very elevated with systolic pressure in the 180 range.  She went to the bathroom and felt lightheaded. She was brought back and lay down. She was taken to the emergency room by her family. In the emergency room, she was very anxious, with shaking, had significant hypertension.  She had EKG done. She was started on nitroglycerin. Our office was called and I suggested she come over to the office for evaluation. In the office, she was calm, blood pressure had improved. In talking to the events, she was very nervous about having her teeth pulled, even nervous the day before. She reports that the dentist told her that she would have to do it under anesthesia next time.    She has been monitoring her blood pressure at home and reports systolic pressures in the 120s over 70s. She denies any chest pain, shortness of breath or edema.   EKG shows normal sinus rhythm with rate of 66 beats a minute, no significant ST or T wave changes Unchanged from previous EKGs. Unchanged from EKG performed in the ER    Outpatient Encounter Prescriptions as of 12/30/2010  Medication Sig Dispense Refill  . Ascorbic Acid (VITAMIN C) 500 MG tablet Take 500 mg by mouth daily.        . Calcium  Carbonate-Vitamin D (CALCIUM 600 + D PO) Take by mouth daily.        Marland Kitchen glucosamine-chondroitin 500-400 MG tablet Take 1 tablet by mouth 2 (two) times daily.        Marland Kitchen lisinopril-hydrochlorothiazide (PRINZIDE,ZESTORETIC) 10-12.5 MG per tablet Take 1 tablet by mouth daily.  30 tablet  6   Facility-Administered Encounter Medications as of 12/30/2010  Medication Dose Route Frequency Provider Last Rate Last Dose  . pneumococcal 23 valent vaccine (PNU-IMMUNE) injection 0.5 mL  0.5 mL Intramuscular Once [employer].         Review of Systems  Constitutional: Negative.   HENT: Negative.   Eyes: Negative.   Respiratory: Negative.   Cardiovascular: Negative.   Gastrointestinal: Negative.   Musculoskeletal: Negative.   Skin: Negative.   Neurological: Positive for dizziness.  Hematological: Negative.   Psychiatric/Behavioral: The patient is nervous/anxious.   All other systems reviewed and are negative.    BP 150/80  Pulse 66  Resp 16  Ht 5\' 3"  (1.6 m)  Wt 153 lb (69.4 kg)  BMI 27.10 kg/m2  Physical Exam  Nursing note and vitals reviewed. Constitutional: She is oriented to person, place, and time. She appears well-developed and well-nourished.  HENT:  Head: Normocephalic.  Nose: Nose normal.  Mouth/Throat: Oropharynx is clear and moist.  Eyes: Conjunctivae are normal. Pupils are equal, round, and reactive to light.  Neck: Normal  range of motion. Neck supple. No JVD present.  Cardiovascular: Normal rate, regular rhythm, S1 normal, S2 normal, normal heart sounds and intact distal pulses.  Exam reveals no gallop and no friction rub.   No murmur heard. Pulmonary/Chest: Effort normal and breath sounds normal. No respiratory distress. She has no wheezes. She has no rales. She exhibits no tenderness.  Abdominal: Soft. Bowel sounds are normal. She exhibits no distension. There is no tenderness.  Musculoskeletal: Normal range of motion. She exhibits no edema and no tenderness.    Lymphadenopathy:    She has no cervical adenopathy.  Neurological: She is alert and oriented to person, place, and time. Coordination normal.  Skin: Skin is warm and dry. No rash noted. No erythema.  Psychiatric: She has a normal mood and affect. Her behavior is normal. Judgment and thought content normal.         Assessment and Plan

## 2010-12-30 NOTE — Assessment & Plan Note (Signed)
Her cholesterol is relatively well controlled. She is not on any cholesterol medication. Her mother is in her 42s. Father was a smoker who had premature coronary disease. Gina Frazier is not a smoker.

## 2010-12-30 NOTE — Assessment & Plan Note (Signed)
In general, blood pressure has been well controlled on her current medication regimen. The severe hypertension today is likely secondary to anxiety in the setting of teeth extraction.

## 2010-12-30 NOTE — Assessment & Plan Note (Signed)
I am concerned that this was a anxiety attack or panic attack. She had clear manifestations of anxiety hypertension, restlessness and shaking. She seemed to calm down on her own in the emergency room. I do not think that any benzodiazepines were given.   We have given her nitroglycerin to take p.r.n. For severe hypertension. I suggested that she contact Dr. Rodena Medin for a small prescription for a benzodiazepine such as Ativan or Xanax that she could take prior to her dental procedure. It would certainly be safe enough for her to retry her dental procedure and teeth extraction with a benzodiazepine and nitroglycerin p.r.n. For hypertension. No further workup is needed from a cardiac perspective.

## 2011-01-01 ENCOUNTER — Encounter: Payer: Self-pay | Admitting: Cardiovascular Disease

## 2011-01-05 ENCOUNTER — Encounter: Payer: Self-pay | Admitting: Internal Medicine

## 2011-01-05 ENCOUNTER — Ambulatory Visit (INDEPENDENT_AMBULATORY_CARE_PROVIDER_SITE_OTHER): Payer: BC Managed Care – PPO | Admitting: Internal Medicine

## 2011-01-05 VITALS — BP 110/72 | Temp 98.5°F | Wt 156.0 lb

## 2011-01-05 DIAGNOSIS — Z0000 Encounter for general adult medical examination without abnormal findings: Secondary | ICD-10-CM

## 2011-01-05 DIAGNOSIS — M549 Dorsalgia, unspecified: Secondary | ICD-10-CM

## 2011-01-05 DIAGNOSIS — G8929 Other chronic pain: Secondary | ICD-10-CM

## 2011-01-05 DIAGNOSIS — F410 Panic disorder [episodic paroxysmal anxiety] without agoraphobia: Secondary | ICD-10-CM

## 2011-01-05 DIAGNOSIS — Z23 Encounter for immunization: Secondary | ICD-10-CM

## 2011-01-05 DIAGNOSIS — M1990 Unspecified osteoarthritis, unspecified site: Secondary | ICD-10-CM

## 2011-01-05 DIAGNOSIS — I10 Essential (primary) hypertension: Secondary | ICD-10-CM

## 2011-01-05 MED ORDER — TRAMADOL HCL 50 MG PO TABS
50.0000 mg | ORAL_TABLET | Freq: Four times a day (QID) | ORAL | Status: DC | PRN
Start: 2011-01-05 — End: 2011-12-23

## 2011-01-05 MED ORDER — MELOXICAM 7.5 MG PO TABS: 7.5000 mg | ORAL_TABLET | Freq: Every day | ORAL | Status: DC

## 2011-01-05 NOTE — Progress Notes (Signed)
  Subjective:    Patient ID: Gina Frazier, female    DOB: 1945-06-23, 65 y.o.   MRN: [id number]  HPI  64 year old patient   He went to her dentist 5 days ago and noted to have a blood pressure reading of 185/100. She was subsequently referred to the ED and then seen by cardiology. She had no chest pain. She is planning to return to a different dentist who will use sedation. She is treated hypertension and blood pressure today is nicely controlled. 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 This has not been helped much by Advil but she did take her daughter's tramadol with nice benefit. X-rays were taken in the spring that revealed mild osteoarthritic changes    Review of Systems  Constitutional: Negative.   HENT: Negative for hearing loss, congestion, sore throat, rhinorrhea, dental problem, sinus pressure and tinnitus.   Eyes: Negative for pain, discharge and visual disturbance.  Respiratory: Negative for cough and shortness of breath.   Cardiovascular: Negative for chest pain, palpitations and leg swelling.  Gastrointestinal: Negative for nausea, vomiting, abdominal pain, diarrhea, constipation, blood in stool and abdominal distention.  Genitourinary: Negative for dysuria, urgency, frequency, hematuria, flank pain, vaginal bleeding, vaginal discharge, difficulty urinating, vaginal pain and pelvic pain.  Musculoskeletal: Positive for back pain. Negative for joint swelling, arthralgias and gait problem.  Skin: Negative for rash.  Neurological: Negative for dizziness, syncope, speech difficulty, weakness, numbness and headaches.  Hematological: Negative for adenopathy.  Psychiatric/Behavioral: Negative for behavioral problems, dysphoric mood and agitation. The patient is not nervous/anxious.        Objective:   Physical Exam  Constitutional: She is oriented to person, place, and time. She appears well-developed and well-nourished.       The pressure 110/70    HENT:  Head: Normocephalic.  Right Ear: External ear normal.  Left Ear: External ear normal.  Mouth/Throat: Oropharynx is clear and moist.  Eyes: Conjunctivae and EOM are normal. Pupils are equal, round, and reactive to light.  Neck: Normal range of motion. Neck supple. No thyromegaly present.  Cardiovascular: Normal rate, regular rhythm, normal heart sounds and intact distal pulses.   Pulmonary/Chest: Effort normal and breath sounds normal.  Abdominal: Soft. Bowel sounds are normal. She exhibits no mass. There is no tenderness.  Musculoskeletal: Normal range of motion.  Lymphadenopathy:    She has no cervical adenopathy.  Neurological: She is alert and oriented to person, place, and time.  Skin: Skin is warm and dry. No rash noted.  Psychiatric: She has a normal mood and affect. Her behavior is normal.          Assessment & Plan:    Hypertension well controlled Chronic low back pain. Osteoarthritis. Will treat with Tylenol when necessary we'll give her a trial of Mobic Anxiety. We'll hold off on anxiolytics since she will be going to a different dentist with the conscious sedation

## 2011-01-05 NOTE — Patient Instructions (Signed)
Limit your sodium (Salt) intake    It is important that you exercise regularly, at least 20 minutes 3 to 4 times per week.  If you develop chest pain or shortness of breath seek  medical attention.  Most patients with low back pain will improve with time over the next two to 6 weeks.  Keep active but avoid any activities that cause pain.  Apply moist heat to the low back area several times daily.  Please check your blood pressure on a regular basis.  If it is consistently greater than 150/90, please make an office appointment.  Return in 3 months for follow-up

## 2011-03-11 ENCOUNTER — Other Ambulatory Visit: Payer: Self-pay

## 2011-03-11 MED ORDER — LISINOPRIL-HYDROCHLOROTHIAZIDE 10-12.5 MG PO TABS: 1.0000 | ORAL_TABLET | Freq: Every day | ORAL | Status: DC

## 2011-09-21 ENCOUNTER — Encounter: Payer: Self-pay | Admitting: Internal Medicine

## 2011-09-21 ENCOUNTER — Ambulatory Visit (INDEPENDENT_AMBULATORY_CARE_PROVIDER_SITE_OTHER): Payer: BC Managed Care – PPO | Admitting: Internal Medicine

## 2011-09-21 VITALS — BP 128/78 | HR 70 | Temp 98.0°F | Wt 158.0 lb

## 2011-09-21 DIAGNOSIS — I10 Essential (primary) hypertension: Secondary | ICD-10-CM

## 2011-09-21 DIAGNOSIS — M549 Dorsalgia, unspecified: Secondary | ICD-10-CM

## 2011-09-21 DIAGNOSIS — G8929 Other chronic pain: Secondary | ICD-10-CM

## 2011-09-21 DIAGNOSIS — M1990 Unspecified osteoarthritis, unspecified site: Secondary | ICD-10-CM

## 2011-09-21 MED ORDER — LISINOPRIL 10 MG PO TABS: 10.0000 mg | ORAL_TABLET | Freq: Every day | ORAL | Status: DC

## 2011-09-21 NOTE — Progress Notes (Signed)
  Subjective:    Patient ID: Gina Frazier, female    DOB: September 12, 1945, 66 y.o.   MRN: [id number]  HPI 66 year old patient who has a history of treated hypertension. Medical regimen includes combination lisinopril hydrochlorothiazide. She presents with a chief complaint of dizziness. This seems to be precipitated by bending over and then standing upright. She has been compliant with her medication. No frank syncope.    Review of Systems  Constitutional: Negative.   HENT: Negative for hearing loss, congestion, sore throat, rhinorrhea, dental problem, sinus pressure and tinnitus.   Eyes: Negative for pain, discharge and visual disturbance.  Respiratory: Negative for cough and shortness of breath.   Cardiovascular: Negative for chest pain, palpitations and leg swelling.  Gastrointestinal: Negative for nausea, vomiting, abdominal pain, diarrhea, constipation, blood in stool and abdominal distention.  Genitourinary: Negative for dysuria, urgency, frequency, hematuria, flank pain, vaginal bleeding, vaginal discharge, difficulty urinating, vaginal pain and pelvic pain.  Musculoskeletal: Negative for joint swelling, arthralgias and gait problem.  Skin: Negative for rash.  Neurological: Positive for light-headedness. Negative for dizziness, syncope, speech difficulty, weakness, numbness and headaches.  Hematological: Negative for adenopathy.  Psychiatric/Behavioral: Negative for behavioral problems, dysphoric mood and agitation. The patient is not nervous/anxious.        Objective:   Physical Exam  Constitutional: She is oriented to person, place, and time. She appears well-developed and well-nourished.       BP  120/72  No orthostatic changes  HENT:  Head: Normocephalic.  Right Ear: External ear normal.  Left Ear: External ear normal.  Mouth/Throat: Oropharynx is clear and moist.  Eyes: Conjunctivae and EOM are normal. Pupils are equal, round, and reactive to light.  Neck: Normal range of  motion. Neck supple. No thyromegaly present.  Cardiovascular: Normal rate, regular rhythm, normal heart sounds and intact distal pulses.   Pulmonary/Chest: Effort normal and breath sounds normal.  Abdominal: Soft. Bowel sounds are normal. She exhibits no mass. There is no tenderness.  Musculoskeletal: Normal range of motion.  Lymphadenopathy:    She has no cervical adenopathy.  Neurological: She is alert and oriented to person, place, and time.  Skin: Skin is warm and dry. No rash noted.  Psychiatric: She has a normal mood and affect. Her behavior is normal.          Assessment & Plan:   HTN- possible mild orthostatic symptoms; will change to lisinopril only and d/c diuretic therapy

## 2011-09-21 NOTE — Patient Instructions (Addendum)
Limit your sodium (Salt) intake  Please check your blood pressure on a regular basis.  If it is consistently greater than 150/90, please make an office appointment.  Return in 3 months for follow-up  

## 2011-12-16 ENCOUNTER — Other Ambulatory Visit: Payer: Medicare Other

## 2011-12-23 ENCOUNTER — Ambulatory Visit (INDEPENDENT_AMBULATORY_CARE_PROVIDER_SITE_OTHER): Payer: BC Managed Care – PPO | Admitting: Internal Medicine

## 2011-12-23 ENCOUNTER — Encounter: Payer: Self-pay | Admitting: Internal Medicine

## 2011-12-23 VITALS — BP 128/80 | HR 70 | Temp 98.0°F | Resp 16 | Ht 62.0 in | Wt 164.0 lb

## 2011-12-23 DIAGNOSIS — I10 Essential (primary) hypertension: Secondary | ICD-10-CM

## 2011-12-23 DIAGNOSIS — Z23 Encounter for immunization: Secondary | ICD-10-CM

## 2011-12-23 DIAGNOSIS — M1990 Unspecified osteoarthritis, unspecified site: Secondary | ICD-10-CM

## 2011-12-23 DIAGNOSIS — Z0000 Encounter for general adult medical examination without abnormal findings: Secondary | ICD-10-CM

## 2011-12-23 LAB — COMPREHENSIVE METABOLIC PANEL
ALT: 20 U/L (ref 0–35)
AST: 24 U/L (ref 0–37)
Albumin: 3.7 g/dL (ref 3.5–5.2)
CO2: 31 mEq/L (ref 19–32)
Calcium: 9.2 mg/dL (ref 8.4–10.5)
Chloride: 103 mEq/L (ref 96–112)
Creatinine, Ser: 0.7 mg/dL (ref 0.4–1.2)
GFR: 92.93 mL/min (ref >60.00)
Potassium: 5.2 mEq/L — ABNORMAL HIGH (ref 3.5–5.1)
Sodium: 139 mEq/L (ref 135–145)
Total Protein: 7.2 g/dL (ref 6.0–8.3)

## 2011-12-23 LAB — CBC WITH DIFFERENTIAL/PLATELET
Basophils Absolute: 0 10*3/uL (ref 0.0–0.1)
Eosinophils Absolute: 0.2 10*3/uL (ref 0.0–0.7)
Lymphocytes Relative: 28 % (ref 12.0–46.0)
MCHC: 32.3 g/dL (ref 30.0–36.0)
Monocytes Relative: 8 % (ref 3.0–12.0)
Neutrophils Relative %: 59.6 % (ref 43.0–77.0)
Platelets: 220 10*3/uL (ref 150.0–400.0)
RDW: 13.8 % (ref 11.5–14.6)

## 2011-12-23 LAB — LIPID PANEL
Total CHOL/HDL Ratio: 2
Triglycerides: 63 mg/dL (ref 0.0–149.0)

## 2011-12-23 MED ORDER — MELOXICAM 7.5 MG PO TABS: 7.5000 mg | ORAL_TABLET | Freq: Every day | ORAL | Status: DC

## 2011-12-23 MED ORDER — LISINOPRIL 10 MG PO TABS: 10.0000 mg | ORAL_TABLET | Freq: Every day | ORAL | Status: DC

## 2011-12-23 MED ORDER — TRAMADOL HCL 50 MG PO TABS: 50.0000 mg | ORAL_TABLET | Freq: Four times a day (QID) | ORAL | Status: DC | PRN

## 2011-12-23 NOTE — Patient Instructions (Addendum)
Limit your sodium (Salt) intake    It is important that you exercise regularly, at least 20 minutes 3 to 4 times per week.  If you develop chest pain or shortness of breath seek  medical attention.  Take a calcium supplement, plus [phone] units of vitamin D  Gynecology followup as discussed  Schedule your colonoscopy to help detect colon cancer.  Return in one year for follow-up  Please check your blood pressure on a regular basis.  If it is consistently greater than 150/90, please make an office appointment.

## 2011-12-23 NOTE — Progress Notes (Signed)
Subjective:    Patient ID: Gina Frazier, female    DOB: [birth year], 66 y.o.   MRN: [id number]  HPI  66 year old patient who is seen today for a health maintenance exam. Rectal problems include treated hypertension. This has done quite well on treatment. She has a history of osteoarthritis and intermittent back pain. This has been fairly stable. She remains quite active and continues to work. Past medical history otherwise fairly unremarkable she has had a remote laparoscopic cholecystectomy. She is a gravida 3 para 2 abortus 1 Family history father died young at age 58 probable cardiac disease mother lives independently and 53 with a history of diabetes. 3 brothers 3 sisters one niece died of diabetic and cardiac complications. Social history lives with the daughter and granddaughter continues to work lifelong nonsmoker  Past Medical History  Diagnosis Date  . Hypertension     History   Social History  . Marital Status: Single    Spouse Name: N/A    Number of Children: N/A  . Years of Education: N/A   Occupational History  . Not on file.   Social History Main Topics  . Smoking status: Never Smoker   . Smokeless tobacco: Not on file  . Alcohol Use: No  . Drug Use: No  . Sexually Active:    Other Topics Concern  . Not on file   Social History Narrative  . No narrative on file    Past Surgical History  Procedure Date  . Cholecystectomy     Family History  Problem Relation Age of Onset  . Heart disease Mother   . Hypertension Mother   . Mental illness Mother   . Diabetes Mother   . Heart disease Father   . Hypertension Sister   . Depression Sister   . Diabetes Sister   . Heart disease Brother   . Hypertension Brother   . Arthritis Daughter   . GER disease Daughter   . Hypothyroidism Daughter     Allergies  Allergen Reactions  . Fish Allergy     REACTION: vomitting, swelling    Current Outpatient Prescriptions on File Prior to Visit  Medication Sig  Dispense Refill  . Ascorbic Acid (VITAMIN C) 500 MG tablet Take 500 mg by mouth daily.        . Calcium Carbonate-Vitamin D (CALCIUM 600 + D PO) Take by mouth daily.        Marland Kitchen glucosamine-chondroitin 500-400 MG tablet Take 1 tablet by mouth 2 (two) times daily.        Marland Kitchen lisinopril (PRINIVIL,ZESTRIL) 10 MG tablet Take 1 tablet (10 mg total) by mouth daily.  90 tablet  3  . meloxicam (MOBIC) 7.5 MG tablet Take 1 tablet (7.5 mg total) by mouth daily.  30 tablet  2  . nitroGLYCERIN (NITROSTAT) 0.4 MG SL tablet Place 1 tablet (0.4 mg total) under the tongue every 5 (five) minutes as needed for chest pain.  25 tablet  11  . traMADol (ULTRAM) 50 MG tablet Take 1 tablet (50 mg total) by mouth every 6 (six) hours as needed for pain.  50 tablet  2    BP 128/80  Pulse 70  Temp 98 F (36.7 C) (Oral)  Resp 16  Ht 5\' 2"  (1.575 m)  Wt 164 lb (74.39 kg)  BMI 30.00 kg/m2       Review of Systems  Constitutional: Negative for fever, appetite change, fatigue and unexpected weight change.  HENT: Negative for hearing loss, ear  pain, nosebleeds, congestion, sore throat, mouth sores, trouble swallowing, neck stiffness, dental problem, voice change, sinus pressure and tinnitus.   Eyes: Negative for photophobia, pain, redness and visual disturbance.  Respiratory: Negative for cough, chest tightness and shortness of breath.   Cardiovascular: Negative for chest pain, palpitations and leg swelling.  Gastrointestinal: Negative for nausea, vomiting, abdominal pain, diarrhea, constipation, blood in stool, abdominal distention and rectal pain.  Genitourinary: Negative for dysuria, urgency, frequency, hematuria, flank pain, vaginal bleeding, vaginal discharge, difficulty urinating, genital sores, vaginal pain, menstrual problem and pelvic pain.  Musculoskeletal: Positive for back pain and arthralgias.  Skin: Negative for rash.  Neurological: Negative for dizziness, syncope, speech difficulty, weakness,  light-headedness, numbness and headaches.  Hematological: Negative for adenopathy. Does not bruise/bleed easily.  Psychiatric/Behavioral: Negative for suicidal ideas, behavioral problems, self-injury, dysphoric mood and agitation. The patient is not nervous/anxious.        Objective:   Physical Exam  Constitutional: She is oriented to person, place, and time. She appears well-developed and well-nourished.  HENT:  Head: Normocephalic and atraumatic.  Right Ear: External ear normal.  Left Ear: External ear normal.  Mouth/Throat: Oropharynx is clear and moist.       Upper dentures in place  Eyes: Conjunctivae normal and EOM are normal.  Neck: Normal range of motion. Neck supple. No JVD present. No thyromegaly present.       Left carotid upstroke diminished  Cardiovascular: Normal rate, regular rhythm, normal heart sounds and intact distal pulses.   No murmur heard.      Dorsalis pedis pulses full posterior tibial pulses faint  Pulmonary/Chest: Effort normal and breath sounds normal. She has no wheezes. She has no rales.  Abdominal: Soft. Bowel sounds are normal. She exhibits no distension and no mass. There is no tenderness. There is no rebound and no guarding.  Genitourinary:       Declines exam. States that she will be seeing her daughter's gynecologist  Musculoskeletal: Normal range of motion. She exhibits no edema and no tenderness.  Neurological: She is alert and oriented to person, place, and time. She has normal reflexes. No cranial nerve deficit. She exhibits normal muscle tone. Coordination normal.  Skin: Skin is warm and dry. No rash noted.  Psychiatric: She has a normal mood and affect. Her behavior is normal.          Assessment & Plan:   Preventive health examination Hypertension well controlled Osteoarthritis stable  Screening colonoscopy will be set up Medications refilled Laboratory update reviewed

## 2012-05-27 ENCOUNTER — Encounter: Payer: Self-pay | Admitting: Internal Medicine

## 2012-05-27 ENCOUNTER — Ambulatory Visit (INDEPENDENT_AMBULATORY_CARE_PROVIDER_SITE_OTHER): Payer: BC Managed Care – PPO | Admitting: Internal Medicine

## 2012-05-27 VITALS — BP 140/80 | HR 68 | Temp 98.0°F | Resp 18 | Wt 164.0 lb

## 2012-05-27 DIAGNOSIS — M1990 Unspecified osteoarthritis, unspecified site: Secondary | ICD-10-CM

## 2012-05-27 DIAGNOSIS — I10 Essential (primary) hypertension: Secondary | ICD-10-CM

## 2012-05-27 DIAGNOSIS — M25569 Pain in unspecified knee: Secondary | ICD-10-CM

## 2012-05-27 MED ORDER — LISINOPRIL 10 MG PO TABS: 10.0000 mg | ORAL_TABLET | Freq: Every day | ORAL | Status: DC

## 2012-05-27 MED ORDER — TRAMADOL HCL 50 MG PO TABS: 50.0000 mg | ORAL_TABLET | Freq: Four times a day (QID) | ORAL | Status: AC | PRN

## 2012-05-27 NOTE — Patient Instructions (Signed)
Call or return to clinic prn if these symptoms worsen or fail to improve as anticipated.  Call for orthopedic referral if unimproved  Continue ibuprofen 600 mg 3 times dailyBursitis Bursitis is a swelling and soreness (inflammation) of a fluid-filled sac (bursa) that overlies and protects a joint. It can be caused by injury, overuse of the joint, arthritis or infection. The joints most likely to be affected are the elbows, shoulders, hips and knees. HOME CARE INSTRUCTIONS   Apply ice to the affected area for 15 to 20 minutes each hour while awake for 2 days. Put the ice in a plastic bag and place a towel between the bag of ice and your skin.  Rest the injured joint as much as possible, but continue to put the joint through a full range of motion, 4 times per day. (The shoulder joint especially becomes rapidly "frozen" if not used.) When the pain lessens, begin normal slow movements and usual activities.  Only take over-the-counter or prescription medicines for pain, discomfort or fever as directed by your caregiver.  Your caregiver may recommend draining the bursa and injecting medicine into the bursa. This may help the healing process.  Follow all instructions for follow-up with your caregiver. This includes any orthopedic referrals, physical therapy and rehabilitation. Any delay in obtaining necessary care could result in a delay or failure of the bursitis to heal and chronic pain. SEEK IMMEDIATE MEDICAL CARE IF:   Your pain increases even during treatment.  You develop an oral temperature above 102 F (38.9 C) and have heat and inflammation over the involved bursa. MAKE SURE YOU:   Understand these instructions.  Will watch your condition.  Will get help right away if you are not doing well or get worse. Document Released: 02/28/2000 Document Revised: 05/25/2011 Document Reviewed: 02/01/2009 [facility] Patient Information 2013 [place], [place].

## 2012-05-27 NOTE — Progress Notes (Signed)
Subjective:    Patient ID: Gina Frazier, female    DOB: Oct 08, 1945, 67 y.o.   MRN: [id number]  HPI  67 year old patient who has a history of treated hypertension and osteoarthritis. She has had some chronic low back pain and also a history of left knee pain in the past. The past week and half she has had right knee pain and swelling. 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  Knee pain and swelling worsens throughout the day. She's has been taking ibuprofen 600 mg twice daily  Past Medical History  Diagnosis Date  . Hypertension     History   Social History  . Marital Status: Single    Spouse Name: N/A    Number of Children: N/A  . Years of Education: N/A   Occupational History  . Not on file.   Social History Main Topics  . Smoking status: Never Smoker   . Smokeless tobacco: Not on file  . Alcohol Use: No  . Drug Use: No  . Sexually Active:    Other Topics Concern  . Not on file   Social History Narrative  . No narrative on file    Past Surgical History  Procedure Laterality Date  . Cholecystectomy      Family History  Problem Relation Age of Onset  . Heart disease Mother   . Hypertension Mother   . Mental illness Mother   . Diabetes Mother   . Heart disease Father   . Hypertension Sister   . Depression Sister   . Diabetes Sister   . Heart disease Brother   . Hypertension Brother   . Arthritis Daughter   . GER disease Daughter   . Hypothyroidism Daughter     Allergies  Allergen Reactions  . Fish Allergy     REACTION: vomitting, swelling    Current Outpatient Prescriptions on File Prior to Visit  Medication Sig Dispense Refill  . Ascorbic Acid (VITAMIN C) 500 MG tablet Take 500 mg by mouth daily.        . Calcium Carbonate-Vitamin D (CALCIUM 600 + D PO) Take by mouth daily.        Marland Kitchen glucosamine-chondroitin 500-400 MG tablet Take 1 tablet by mouth 2 (two) times daily.        Marland Kitchen lisinopril  (PRINIVIL,ZESTRIL) 10 MG tablet Take 1 tablet (10 mg total) by mouth daily.  90 tablet  3  . nitroGLYCERIN (NITROSTAT) 0.4 MG SL tablet Place 1 tablet (0.4 mg total) under the tongue every 5 (five) minutes as needed for chest pain.  25 tablet  11  . traMADol (ULTRAM) 50 MG tablet Take 1 tablet (50 mg total) by mouth every 6 (six) hours as needed for pain.  50 tablet  2   No current facility-administered medications on file prior to visit.    BP 140/80  Pulse 68  Temp(Src) 98 F (36.7 C) (Oral)  Resp 18  Wt 164 lb (74.39 kg)  BMI 29.99 kg/m2  SpO2 95%       Review of Systems  Constitutional: Negative.   HENT: Negative for hearing loss, congestion, sore throat, rhinorrhea, dental problem, sinus pressure and tinnitus.   Eyes: Negative for pain, discharge and visual disturbance.  Respiratory: Negative for cough and shortness of breath.   Cardiovascular: Negative for chest pain, palpitations and leg swelling.  Gastrointestinal: Negative for nausea, vomiting, abdominal pain, diarrhea, constipation, blood in stool and abdominal distention.  Genitourinary: Negative for dysuria, urgency, frequency, hematuria, flank pain, vaginal bleeding, vaginal discharge, difficulty urinating, vaginal pain and pelvic pain.  Musculoskeletal: Positive for back pain, joint swelling and arthralgias. Negative for gait problem.  Skin: Negative for rash.  Neurological: Negative for dizziness, syncope, speech difficulty, weakness, numbness and headaches.  Hematological: Negative for adenopathy.  Psychiatric/Behavioral: Negative for behavioral problems, dysphoric mood and agitation. The patient is not nervous/anxious.        Objective:   Physical Exam  Constitutional: She appears well-developed and well-nourished. No distress.  140/80  Musculoskeletal:  Right knee is slightly warm to touch but no significant effusion noted at this early morning appointment          Assessment & Plan:   Right knee  pain. Possible bursitis. Patient has a history of worsening effusion. May have significant osteoarthritis or meniscal tear. The patient will attempt to minimize her activities. She'll use ibuprofen 600 mg 3 times a day with meals. Proper foot care discussed. If problems continue has been asked to notify the office for orthopedic referral Hypertension stable

## 2012-06-23 ENCOUNTER — Encounter: Payer: Self-pay | Admitting: Family Medicine

## 2012-06-23 ENCOUNTER — Ambulatory Visit (INDEPENDENT_AMBULATORY_CARE_PROVIDER_SITE_OTHER): Payer: BC Managed Care – PPO | Admitting: Family Medicine

## 2012-06-23 VITALS — BP 110/70 | Temp 98.0°F | Wt 165.0 lb

## 2012-06-23 DIAGNOSIS — M25561 Pain in right knee: Secondary | ICD-10-CM

## 2012-06-23 DIAGNOSIS — M25569 Pain in unspecified knee: Secondary | ICD-10-CM

## 2012-06-23 NOTE — Progress Notes (Signed)
Chief Complaint  Patient presents with  . swollen right knee    paniful    HPI:  Knee Pain: -per review of PCP notes, seen for this recently with concern for OA or meniscal tear -ibuprofen and proper foot care advised and was to call if not improving for orthopedic referral -Saw PCP 1 month ago for this, but not getting better despite good shoes and ibuprofen -reports pain and swelling in R knee -worse with lots of activity, rest makes it better -denies clicking or giving away, fevers, chills, malaise  ROS: See pertinent positives and negatives per HPI.  Past Medical History  Diagnosis Date  . Hypertension     Family History  Problem Relation Age of Onset  . Heart disease Mother   . Hypertension Mother   . Mental illness Mother   . Diabetes Mother   . Heart disease Father   . Hypertension Sister   . Depression Sister   . Diabetes Sister   . Heart disease Brother   . Hypertension Brother   . Arthritis Daughter   . GER disease Daughter   . Hypothyroidism Daughter     History   Social History  . Marital Status: Single    Spouse Name: N/A    Number of Children: N/A  . Years of Education: N/A   Social History Main Topics  . Smoking status: Never Smoker   . Smokeless tobacco: None  . Alcohol Use: No  . Drug Use: No  . Sexually Active:    Other Topics Concern  . None   Social History Narrative  . None    Current outpatient prescriptions:Ascorbic Acid (VITAMIN C) 500 MG tablet, Take 500 mg by mouth daily.  , Disp: , Rfl: ;  Calcium Carbonate-Vitamin D (CALCIUM 600 + D PO), Take by mouth daily.  , Disp: , Rfl: ;  glucosamine-chondroitin 500-400 MG tablet, Take 1 tablet by mouth 2 (two) times daily.  , Disp: , Rfl: ;  lisinopril (PRINIVIL,ZESTRIL) 10 MG tablet, Take 1 tablet (10 mg total) by mouth daily., Disp: 90 tablet, Rfl: 3 traMADol (ULTRAM) 50 MG tablet, Take 1 tablet (50 mg total) by mouth every 6 (six) hours as needed for pain., Disp: 50 tablet, Rfl: 2;   nitroGLYCERIN (NITROSTAT) 0.4 MG SL tablet, Place 1 tablet (0.4 mg total) under the tongue every 5 (five) minutes as needed for chest pain., Disp: 25 tablet, Rfl: 11  EXAM:  Filed Vitals:   06/23/12 0800  BP: 110/70  Temp: 98 F (36.7 C)    Body mass index is 30.17 kg/(m^2).  GENERAL: vitals reviewed and listed above, alert, oriented, appears well hydrated and in no acute distress  HEENT: atraumatic, conjunttiva clear, no obvious abnormalities on inspection of external nose and ears  NECK: no obvious masses on inspection  MS: moves all extremities without noticeable abnormality -on inspection, ? small effusion L knee, no erythema or warmth -no patellar crepitus -minimal TTP diffusely along joint line -neg lachman, neg val/var stress, neg ant/post drawer, tenderness medial jt line with mcmurry, no TTP at petellar tendon or pes anserine  PSYCH: pleasant and cooperative, no obvious depression or anxiety  ASSESSMENT AND PLAN:  Discussed the following assessment and plan:  Knee pain, right - Plan: Ambulatory referral to Orthopedic Surgery  -per PCP recs placed referral to ortho - pt had misunderstood and thought this referral was placed last visit -in the meantime advised ice after activity, cont treatment per last visit -Patient advised to return  or notify a doctor immediately if symptoms worsen or persist or new concerns arise.  There are no Patient Instructions on file for this visit.   Colin Benton R.

## 2013-05-22 ENCOUNTER — Other Ambulatory Visit: Payer: Self-pay | Admitting: Internal Medicine

## 2013-08-14 ENCOUNTER — Other Ambulatory Visit: Payer: Self-pay | Admitting: Internal Medicine

## 2013-08-18 ENCOUNTER — Telehealth: Payer: Self-pay | Admitting: Internal Medicine

## 2013-08-18 NOTE — Telephone Encounter (Signed)
error 

## 2013-08-21 ENCOUNTER — Telehealth: Payer: Self-pay | Admitting: Internal Medicine

## 2013-08-21 NOTE — Telephone Encounter (Signed)
Please advise 

## 2013-08-21 NOTE — Telephone Encounter (Signed)
Called Gina Frazier back and told her Dr. Raliegh Ip said it was okay to have a mix drink while on vacation. Gina Frazier verbalized understanding.

## 2013-08-21 NOTE — Telephone Encounter (Signed)
ok 

## 2013-08-21 NOTE — Telephone Encounter (Signed)
Pt's daughter is calling wanting to know if its ok for the pt to have a mix drink while on lisinopril (PRINIVIL,ZESTRIL) 10 MG tablet. Pt will be going on vacation for her birthday. Daughter wants to check first.

## 2013-09-26 ENCOUNTER — Ambulatory Visit (INDEPENDENT_AMBULATORY_CARE_PROVIDER_SITE_OTHER): Payer: BC Managed Care – PPO | Admitting: Internal Medicine

## 2013-09-26 ENCOUNTER — Encounter: Payer: Self-pay | Admitting: Internal Medicine

## 2013-09-26 VITALS — BP 110/74 | HR 70 | Temp 98.2°F | Resp 18 | Ht 61.5 in | Wt 157.0 lb

## 2013-09-26 DIAGNOSIS — R259 Unspecified abnormal involuntary movements: Secondary | ICD-10-CM

## 2013-09-26 DIAGNOSIS — E559 Vitamin D deficiency, unspecified: Secondary | ICD-10-CM

## 2013-09-26 DIAGNOSIS — R251 Tremor, unspecified: Secondary | ICD-10-CM

## 2013-09-26 DIAGNOSIS — M549 Dorsalgia, unspecified: Secondary | ICD-10-CM

## 2013-09-26 DIAGNOSIS — Z0000 Encounter for general adult medical examination without abnormal findings: Secondary | ICD-10-CM

## 2013-09-26 DIAGNOSIS — F410 Panic disorder [episodic paroxysmal anxiety] without agoraphobia: Secondary | ICD-10-CM

## 2013-09-26 DIAGNOSIS — M1990 Unspecified osteoarthritis, unspecified site: Secondary | ICD-10-CM

## 2013-09-26 DIAGNOSIS — I10 Essential (primary) hypertension: Secondary | ICD-10-CM

## 2013-09-26 DIAGNOSIS — G8929 Other chronic pain: Secondary | ICD-10-CM

## 2013-09-26 LAB — COMPREHENSIVE METABOLIC PANEL
ALBUMIN: 4 g/dL (ref 3.5–5.2)
ALK PHOS: 79 U/L (ref 39–117)
ALT: 15 U/L (ref 0–35)
AST: 23 U/L (ref 0–37)
BUN: 22 mg/dL (ref 6–23)
CALCIUM: 9.5 mg/dL (ref 8.4–10.5)
CO2: 29 mEq/L (ref 19–32)
Chloride: 101 mEq/L (ref 96–112)
Creatinine, Ser: 0.7 mg/dL (ref 0.4–1.2)
GFR: 94.07 mL/min (ref >60.00)
GLUCOSE: 86 mg/dL (ref 70–99)
POTASSIUM: 5.4 meq/L — AB (ref 3.5–5.1)
Sodium: 137 mEq/L (ref 135–145)
Total Bilirubin: 1.1 mg/dL (ref 0.2–1.2)
Total Protein: 7.5 g/dL (ref 6.0–8.3)

## 2013-09-26 LAB — CBC WITH DIFFERENTIAL/PLATELET
BASOS PCT: 0.3 % (ref 0.0–3.0)
Basophils Absolute: 0 10*3/uL (ref 0.0–0.1)
EOS PCT: 3.4 % (ref 0.0–5.0)
Eosinophils Absolute: 0.2 10*3/uL (ref 0.0–0.7)
HCT: 40 % (ref 36.0–46.0)
Hemoglobin: 13.2 g/dL (ref 12.0–15.0)
LYMPHS PCT: 28.4 % (ref 12.0–46.0)
Lymphs Abs: 1.7 10*3/uL (ref 0.7–4.0)
MCHC: 32.9 g/dL (ref 30.0–36.0)
MCV: 91.2 fl (ref 78.0–100.0)
MONOS PCT: 9.3 % (ref 3.0–12.0)
Monocytes Absolute: 0.6 10*3/uL (ref 0.1–1.0)
NEUTROS PCT: 58.6 % (ref 43.0–77.0)
Neutro Abs: 3.5 10*3/uL (ref 1.4–7.7)
Platelets: 216 10*3/uL (ref 150.0–400.0)
RBC: 4.39 Mil/uL (ref 3.87–5.11)
RDW: 14 % (ref 11.5–15.5)
WBC: 6 10*3/uL (ref 4.0–10.5)

## 2013-09-26 LAB — LIPID PANEL
CHOLESTEROL: 201 mg/dL — AB (ref 0–200)
HDL: 85.1 mg/dL (ref >39.00)
LDL CALC: 102 mg/dL — AB (ref 0–99)
NonHDL: 115.9
TRIGLYCERIDES: 69 mg/dL (ref 0.0–149.0)
Total CHOL/HDL Ratio: 2
VLDL: 13.8 mg/dL (ref 0.0–40.0)

## 2013-09-26 LAB — TSH: TSH: 1.81 u[IU]/mL (ref 0.35–4.50)

## 2013-09-26 MED ORDER — PROPRANOLOL HCL ER 80 MG PO CP24: 80.0000 mg | ORAL_CAPSULE | Freq: Every day | ORAL | Status: DC

## 2013-09-26 NOTE — Patient Instructions (Addendum)
Schedule your colonoscopy to help detect colon cancer.  Schedule your mammogram.  Limit your sodium (Salt) intake    It is important that you exercise regularly, at least 20 minutes 3 to 4 times per week.  If you develop chest pain or shortness of breath seek  medical attention.  Take a calcium supplement, plus [phone] units of vitamin DHealth Maintenance, Female A healthy lifestyle and preventative care can promote health and wellness.  Maintain regular health, dental, and eye exams.  Eat a healthy diet. Foods like vegetables, fruits, whole grains, low-fat dairy products, and lean protein foods contain the nutrients you need without too many calories. Decrease your intake of foods high in solid fats, added sugars, and salt. Get information about a proper diet from your caregiver, if necessary.  Regular physical exercise is one of the most important things you can do for your health. Most adults should get at least 150 minutes of moderate-intensity exercise (any activity that increases your heart rate and causes you to sweat) each week. In addition, most adults need muscle-strengthening exercises on 2 or more days a week.   Maintain a healthy weight. The body mass index (BMI) is a screening tool to identify possible weight problems. It provides an estimate of body fat based on height and weight. Your caregiver can help determine your BMI, and can help you achieve or maintain a healthy weight. For adults 20 years and older:  A BMI below 18.5 is considered underweight.  A BMI of 18.5 to 24.9 is normal.  A BMI of 25 to 29.9 is considered overweight.  A BMI of 30 and above is considered obese.  Maintain normal blood lipids and cholesterol by exercising and minimizing your intake of saturated fat. Eat a balanced diet with plenty of fruits and vegetables. Blood tests for lipids and cholesterol should begin at age 69 and be repeated every 5 years. If your lipid or cholesterol levels are high,  you are over 50, or you are a high risk for heart disease, you may need your cholesterol levels checked more frequently.Ongoing high lipid and cholesterol levels should be treated with medicines if diet and exercise are not effective.  If you smoke, find out from your caregiver how to quit. If you do not use tobacco, do not start.  Lung cancer screening is recommended for adults aged 69-80 years who are at high risk for developing lung cancer because of a history of smoking. Yearly low-dose computed tomography (CT) is recommended for people who have at least a 30-pack-year history of smoking and are a current smoker or have quit within the past 15 years. A pack year of smoking is smoking an average of 1 pack of cigarettes a day for 1 year (for example: 1 pack a day for 30 years or 2 packs a day for 15 years). Yearly screening should continue until the smoker has stopped smoking for at least 15 years. Yearly screening should also be stopped for people who develop a health problem that would prevent them from having lung cancer treatment.  If you are pregnant, do not drink alcohol. If you are breastfeeding, be very cautious about drinking alcohol. If you are not pregnant and choose to drink alcohol, do not exceed 1 drink per day. One drink is considered to be 12 ounces (355 mL) of beer, 5 ounces (148 mL) of wine, or 1.5 ounces (44 mL) of liquor.  Avoid use of street drugs. Do not share needles with anyone. Ask for  help if you need support or instructions about stopping the use of drugs.  High blood pressure causes heart disease and increases the risk of stroke. Blood pressure should be checked at least every 1 to 2 years. Ongoing high blood pressure should be treated with medicines, if weight loss and exercise are not effective.  If you are 94 to 70 years old, ask your caregiver if you should take aspirin to prevent strokes.  Diabetes screening involves taking a blood sample to check your fasting blood  sugar level. This should be done once every 3 years, after age 56, if you are within normal weight and without risk factors for diabetes. Testing should be considered at a younger age or be carried out more frequently if you are overweight and have at least 1 risk factor for diabetes.  Breast cancer screening is essential preventative care for women. You should practice "breast self-awareness." This means understanding the normal appearance and feel of your breasts and may include breast self-examination. Any changes detected, no matter how small, should be reported to a caregiver. Women in their 40s and 30s should have a clinical breast exam (CBE) by a caregiver as part of a regular health exam every 1 to 3 years. After age 59, women should have a CBE every year. Starting at age 18, women should consider having a mammogram (breast X-ray) every year. Women who have a family history of breast cancer should talk to their caregiver about genetic screening. Women at a high risk of breast cancer should talk to their caregiver about having an MRI and a mammogram every year.  Breast cancer gene (BRCA)-related cancer risk assessment is recommended for women who have family members with BRCA-related cancers. BRCA-related cancers include breast, ovarian, tubal, and peritoneal cancers. Having family members with these cancers may be associated with an increased risk for harmful changes (mutations) in the breast cancer genes BRCA1 and BRCA2. Results of the assessment will determine the need for genetic counseling and BRCA1 and BRCA2 testing.  The Pap test is a screening test for cervical cancer. Women should have a Pap test starting at age 33. Between ages 59 and 29, Pap tests should be repeated every 2 years. Beginning at age 11, you should have a Pap test every 3 years as long as the past 3 Pap tests have been normal. If you had a hysterectomy for a problem that was not cancer or a condition that could lead to cancer,  then you no longer need Pap tests. If you are between ages 67 and 28, and you have had normal Pap tests going back 10 years, you no longer need Pap tests. If you have had past treatment for cervical cancer or a condition that could lead to cancer, you need Pap tests and screening for cancer for at least 20 years after your treatment. If Pap tests have been discontinued, risk factors (such as a new sexual partner) need to be reassessed to determine if screening should be resumed. Some women have medical problems that increase the chance of getting cervical cancer. In these cases, your caregiver may recommend more frequent screening and Pap tests.  The human papillomavirus (HPV) test is an additional test that may be used for cervical cancer screening. The HPV test looks for the virus that can cause the cell changes on the cervix. The cells collected during the Pap test can be tested for HPV. The HPV test could be used to screen women aged 17 years and older,  and should be used in women of any age who have unclear Pap test results. After the age of 61, women should have HPV testing at the same frequency as a Pap test.  Colorectal cancer can be detected and often prevented. Most routine colorectal cancer screening begins at the age of 37 and continues through age 61. However, your caregiver may recommend screening at an earlier age if you have risk factors for colon cancer. On a yearly basis, your caregiver may provide home test kits to check for hidden blood in the stool. Use of a small camera at the end of a tube, to directly examine the colon (sigmoidoscopy or colonoscopy), can detect the earliest forms of colorectal cancer. Talk to your caregiver about this at age 68, when routine screening begins. Direct examination of the colon should be repeated every 5 to 10 years through age 26, unless early forms of pre-cancerous polyps or small growths are found.  Hepatitis C blood testing is recommended for all people  born from [birth year] through 1965 and any individual with known risks for hepatitis C.  Practice safe sex. Use condoms and avoid high-risk sexual practices to reduce the spread of sexually transmitted infections (STIs). Sexually active women aged 64 and younger should be checked for Chlamydia, which is a common sexually transmitted infection. Older women with new or multiple partners should also be tested for Chlamydia. Testing for other STIs is recommended if you are sexually active and at increased risk.  Osteoporosis is a disease in which the bones lose minerals and strength with aging. This can result in serious bone fractures. The risk of osteoporosis can be identified using a bone density scan. Women ages 10 and over and women at risk for fractures or osteoporosis should discuss screening with their caregivers. Ask your caregiver whether you should be taking a calcium supplement or vitamin D to reduce the rate of osteoporosis.  Menopause can be associated with physical symptoms and risks. Hormone replacement therapy is available to decrease symptoms and risks. You should talk to your caregiver about whether hormone replacement therapy is right for you.  Use sunscreen. Apply sunscreen liberally and repeatedly throughout the day. You should seek shade when your shadow is shorter than you. Protect yourself by wearing long sleeves, pants, a wide-brimmed hat, and sunglasses year round, whenever you are outdoors.  Notify your caregiver of new moles or changes in moles, especially if there is a change in shape or color. Also notify your caregiver if a mole is larger than the size of a pencil eraser.  Stay current with your immunizations. Document Released: 09/15/2010 Document Revised: 06/27/2012 Document Reviewed: 02/01/2013 [facility] Patient Information 2015 [place], [place]. This information is not intended to replace advice given to you by your health care provider. Make sure you discuss any questions you  have with your health care provider. Insomnia Insomnia is frequent trouble falling and/or staying asleep. Insomnia can be a long term problem or a short term problem. Both are common. Insomnia can be a short term problem when the wakefulness is related to a certain stress or worry. Long term insomnia is often related to ongoing stress during waking hours and/or poor sleeping habits. Overtime, sleep deprivation itself can make the problem worse. Every little thing feels more severe because you are overtired and your ability to cope is decreased. CAUSES   Stress, anxiety, and depression.  Poor sleeping habits.  Distractions such as TV in the bedroom.  Naps close to bedtime.  Engaging  in emotionally charged conversations before bed.  Technical reading before sleep.  Alcohol and other sedatives. They may make the problem worse. They can hurt normal sleep patterns and normal dream activity.  Stimulants such as caffeine for several hours prior to bedtime.  Pain syndromes and shortness of breath can cause insomnia.  Exercise late at night.  Changing time zones may cause sleeping problems (jet lag). It is sometimes helpful to have someone observe your sleeping patterns. They should look for periods of not breathing during the night (sleep apnea). They should also look to see how long those periods last. If you live alone or observers are uncertain, you can also be observed at a sleep clinic where your sleep patterns will be professionally monitored. Sleep apnea requires a checkup and treatment. Give your caregivers your medical history. Give your caregivers observations your family has made about your sleep.  SYMPTOMS   Not feeling rested in the morning.  Anxiety and restlessness at bedtime.  Difficulty falling and staying asleep. TREATMENT   Your caregiver may prescribe treatment for an underlying medical disorders. Your caregiver can give advice or help if you are using alcohol or other  drugs for self-medication. Treatment of underlying problems will usually eliminate insomnia problems.  Medications can be prescribed for short time use. They are generally not recommended for lengthy use.  Over-the-counter sleep medicines are not recommended for lengthy use. They can be habit forming.  You can promote easier sleeping by making lifestyle changes such as:  Using relaxation techniques that help with breathing and reduce muscle tension.  Exercising earlier in the day.  Changing your diet and the time of your last meal. No night time snacks.  Establish a regular time to go to bed.  Counseling can help with stressful problems and worry.  Soothing music and white noise may be helpful if there are background noises you cannot remove.  Stop tedious detailed work at least one hour before bedtime. HOME CARE INSTRUCTIONS   Keep a diary. Inform your caregiver about your progress. This includes any medication side effects. See your caregiver regularly. Take note of:  Times when you are asleep.  Times when you are awake during the night.  The quality of your sleep.  How you feel the next day. This information will help your caregiver care for you.  Get out of bed if you are still awake after 15 minutes. Read or do some quiet activity. Keep the lights down. Wait until you feel sleepy and go back to bed.  Keep regular sleeping and waking hours. Avoid naps.  Exercise regularly.  Avoid distractions at bedtime. Distractions include watching television or engaging in any intense or detailed activity like attempting to balance the household checkbook.  Develop a bedtime ritual. Keep a familiar routine of bathing, brushing your teeth, climbing into bed at the same time each night, listening to soothing music. Routines increase the success of falling to sleep faster.  Use relaxation techniques. This can be using breathing and muscle tension release routines. It can also include  visualizing peaceful scenes. You can also help control troubling or intruding thoughts by keeping your mind occupied with boring or repetitive thoughts like the old concept of counting sheep. You can make it more creative like imagining planting one beautiful flower after another in your backyard garden.  During your day, work to eliminate stress. When this is not possible use some of the previous suggestions to help reduce the anxiety that accompanies stressful situations.  MAKE SURE YOU:   Understand these instructions.  Will watch your condition.  Will get help right away if you are not doing well or get worse. Document Released: 02/28/2000 Document Revised: 05/25/2011 Document Reviewed: 03/30/2007 [facility] Patient Information 2015 [place], [place]. This information is not intended to replace advice given to you by your health care provider. Make sure you discuss any questions you have with your health care provider.

## 2013-09-26 NOTE — Progress Notes (Signed)
Subjective:    Patient ID: Gina Frazier, female    DOB: 1945/04/11, 68 y.o.   MRN: [id number]  HPI 68 -year-old patient who is seen today for a health maintenance exam.   Medical problems include treated hypertension. This has done quite well on treatment. She has a history of osteoarthritis and intermittent back pain. This has been fairly stable. She remains quite active and continues to work.  Past medical history otherwise fairly unremarkable she has had a remote laparoscopic cholecystectomy. She is a gravida 3 para 2 abortus 1 No screening colonoscopies  Family history father died young at age 48 probable cardiac disease;  mother lives independently  Age  70 with a history of diabetes. 3 brothers 3 sisters one niece died of diabetic and cardiac complications.  And one brother with a history of colon cancer. Social history lives with the daughter and granddaughter continues to work lifelong nonsmoker  Past Medical History  Diagnosis Date  . Hypertension     History   Social History  . Marital Status: Single    Spouse Name: N/A    Number of Children: N/A  . Years of Education: N/A   Occupational History  . Not on file.   Social History Main Topics  . Smoking status: Never Smoker   . Smokeless tobacco: Never Used  . Alcohol Use: No  . Drug Use: No  . Sexual Activity: Not on file   Other Topics Concern  . Not on file   Social History Narrative  . No narrative on file    Past Surgical History  Procedure Laterality Date  . Cholecystectomy      Family History  Problem Relation Age of Onset  . Heart disease Mother   . Hypertension Mother   . Mental illness Mother   . Diabetes Mother   . Heart disease Father   . Hypertension Sister   . Depression Sister   . Diabetes Sister   . Heart disease Brother   . Hypertension Brother   . Arthritis Daughter   . GER disease Daughter   . Hypothyroidism Daughter     Allergies  Allergen Reactions  . Fish Allergy      REACTION: vomitting, swelling    Current Outpatient Prescriptions on File Prior to Visit  Medication Sig Dispense Refill  . Ascorbic Acid (VITAMIN C) 500 MG tablet Take 500 mg by mouth daily.        . Calcium Carbonate-Vitamin D (CALCIUM 600 + D PO) Take by mouth daily.        Marland Kitchen glucosamine-chondroitin 500-400 MG tablet Take 1 tablet by mouth 2 (two) times daily.        Marland Kitchen lisinopril (PRINIVIL,ZESTRIL) 10 MG tablet TAKE ONE TABLET BY MOUTH ONCE DAILY *NEEDS OFFICE VISIT*  90 tablet  1  . nitroGLYCERIN (NITROSTAT) 0.4 MG SL tablet Place 1 tablet (0.4 mg total) under the tongue every 5 (five) minutes as needed for chest pain.  25 tablet  11   No current facility-administered medications on file prior to visit.    BP 110/74  Pulse 70  Temp(Src) 98.2 F (36.8 C) (Oral)  Resp 18  Ht 5' 1.5" (1.562 m)  Wt 157 lb (71.215 kg)  BMI 29.19 kg/m2  SpO2 97%  1. Risk factors, based on past  M,S,F history.  Cardiovascular risk factors include a history of hypertension  2.  Physical activities: No activity restrictions.  Works 3 days weekly at [employer]  3.  Depression/mood: No  history of depression, but does have chronic anxiety.  Complains of some insomnia issues  4.  Hearing: No deficits  5.  ADL's: Independent in all aspects of daily living  6.  Fall risk: Low  7.  Home safety: No problems identified  8.  Height weight, and visual acuity; height and weight stable.  No change in visual acuity  9.  Counseling: The screening colonoscopy as well as mammogram  10. Lab orders based on risk factors: Laboratory update will be reviewed  11. Referral : GI referral for colonoscopy.  Mammogram encouraged  12. Care plan: Preventive health to include mammogram.  Calcium and vitamin D supplements encouraged  13. Cognitive assessment: Alert and oriented with normal affect.  No cognitive dysfunction      Review of Systems  Constitutional: Negative for fever, appetite change, fatigue and  unexpected weight change.  HENT: Negative for congestion, dental problem, ear pain, hearing loss, mouth sores, nosebleeds, sinus pressure, sore throat, tinnitus, trouble swallowing and voice change.   Eyes: Negative for photophobia, pain, redness and visual disturbance.  Respiratory: Negative for cough, chest tightness and shortness of breath.   Cardiovascular: Negative for chest pain, palpitations and leg swelling.  Gastrointestinal: Negative for nausea, vomiting, abdominal pain, diarrhea, constipation, blood in stool, abdominal distention and rectal pain.  Genitourinary: Negative for dysuria, urgency, frequency, hematuria, flank pain, vaginal bleeding, vaginal discharge, difficulty urinating, genital sores, vaginal pain, menstrual problem and pelvic pain.  Musculoskeletal: Positive for arthralgias and back pain. Negative for neck stiffness.  Skin: Negative for rash.  Neurological: Negative for dizziness, syncope, speech difficulty, weakness, light-headedness, numbness and headaches.  Hematological: Negative for adenopathy. Does not bruise/bleed easily.  Psychiatric/Behavioral: Positive for sleep disturbance. Negative for suicidal ideas, behavioral problems, self-injury, dysphoric mood and agitation. The patient is nervous/anxious.        Objective:   Physical Exam  Constitutional: She is oriented to person, place, and time. She appears well-developed and well-nourished.  HENT:  Head: Normocephalic and atraumatic.  Right Ear: External ear normal.  Left Ear: External ear normal.  Mouth/Throat: Oropharynx is clear and moist.  Upper dentures in place  Eyes: Conjunctivae and EOM are normal.  Neck: Normal range of motion. Neck supple. No JVD present. No thyromegaly present.  Left carotid upstroke diminished  Cardiovascular: Normal rate, regular rhythm, normal heart sounds and intact distal pulses.   No murmur heard. Dorsalis pedis pulses full posterior tibial pulses faint  Pulmonary/Chest:  Effort normal and breath sounds normal. She has no wheezes. She has no rales.  Abdominal: Soft. Bowel sounds are normal. She exhibits no distension and no mass. There is no tenderness. There is no rebound and no guarding.  Genitourinary:  Declines exam.  Musculoskeletal: Normal range of motion. She exhibits no edema and no tenderness.  Neurological: She is alert and oriented to person, place, and time. She has normal reflexes. No cranial nerve deficit. She exhibits normal muscle tone. Coordination normal.  Prominent tremor of the hands, as well as a milder head and neck tremor  Skin: Skin is warm and dry. No rash noted.  Psychiatric: She has a normal mood and affect. Her behavior is normal.          Assessment & Plan:   Preventive health examination Hypertension well controlled Osteoarthritis stable  familial tremor.  Will place on low dose beta blocker therapy Family history of colon cancer Insomnia.  Sleep hygiene issues discussed.  We'll give a trial of melatonin   Screening colonoscopy  will be set up Medications refilled Laboratory update reviewed

## 2013-09-26 NOTE — Progress Notes (Signed)
Pre visit review using our clinic review tool, if applicable. No additional management support is needed unless otherwise documented below in the visit note. 

## 2013-09-27 ENCOUNTER — Telehealth: Payer: Self-pay | Admitting: Internal Medicine

## 2013-09-27 NOTE — Telephone Encounter (Signed)
Relevant patient education mailed to patient.  

## 2013-09-28 ENCOUNTER — Telehealth: Payer: Self-pay | Admitting: Internal Medicine

## 2013-09-28 DIAGNOSIS — R9431 Abnormal electrocardiogram [ECG] [EKG]: Secondary | ICD-10-CM

## 2013-09-28 MED ORDER — NITROGLYCERIN 0.4 MG SL SUBL: 0.4000 mg | SUBLINGUAL_TABLET | SUBLINGUAL | Status: DC | PRN

## 2013-09-28 NOTE — Telephone Encounter (Signed)
Pt is needing new rx nitroglycerin (nitrostat) 0.4 mg, pt states dr. Raliegh Ip informed her that he would call the med in for her, however it was not at the pharmacy when she went to pick it up. States he stated she should keep it in her purse at all times. Send to [facility]- [address] in [place].

## 2013-09-28 NOTE — Telephone Encounter (Signed)
Pt's daughter notified Rx sent to pharmacy.

## 2013-11-29 ENCOUNTER — Encounter: Payer: Self-pay | Admitting: Internal Medicine

## 2014-04-28 ENCOUNTER — Observation Stay: Payer: Self-pay | Admitting: Internal Medicine

## 2014-04-28 DIAGNOSIS — I10 Essential (primary) hypertension: Secondary | ICD-10-CM

## 2014-04-28 DIAGNOSIS — I361 Nonrheumatic tricuspid (valve) insufficiency: Secondary | ICD-10-CM

## 2014-04-28 DIAGNOSIS — I4891 Unspecified atrial fibrillation: Secondary | ICD-10-CM

## 2014-04-30 ENCOUNTER — Telehealth: Payer: Self-pay

## 2014-04-30 NOTE — Telephone Encounter (Signed)
Patient contacted regarding discharge from [facility] on 04/29/14.  Patient understands to follow up with Dr. Rockey Situ on 05/01/14 at 2:30 at [facility]. Patient understands discharge instructions? yes Patient understands medications and regiment? yes Patient understands to bring all medications to this visit? yes

## 2014-05-01 ENCOUNTER — Encounter (INDEPENDENT_AMBULATORY_CARE_PROVIDER_SITE_OTHER): Payer: Self-pay

## 2014-05-01 ENCOUNTER — Ambulatory Visit (INDEPENDENT_AMBULATORY_CARE_PROVIDER_SITE_OTHER): Payer: BLUE CROSS/BLUE SHIELD | Admitting: Cardiovascular Disease

## 2014-05-01 VITALS — BP 138/72 | HR 57 | Ht 62.0 in | Wt 162.5 lb

## 2014-05-01 DIAGNOSIS — I159 Secondary hypertension, unspecified: Secondary | ICD-10-CM

## 2014-05-01 DIAGNOSIS — R0683 Snoring: Secondary | ICD-10-CM | POA: Insufficient documentation

## 2014-05-01 DIAGNOSIS — I480 Paroxysmal atrial fibrillation: Secondary | ICD-10-CM

## 2014-05-01 MED ORDER — PROPRANOLOL HCL 20 MG PO TABS: 20.0000 mg | ORAL_TABLET | Freq: Three times a day (TID) | ORAL | Status: DC | PRN

## 2014-05-01 MED ORDER — TRAZODONE HCL 50 MG PO TABS: 50.0000 mg | ORAL_TABLET | Freq: Every day | ORAL | Status: DC

## 2014-05-01 MED ORDER — FLECAINIDE ACETATE 50 MG PO TABS: 50.0000 mg | ORAL_TABLET | Freq: Two times a day (BID) | ORAL | Status: DC

## 2014-05-01 MED ORDER — PROPRANOLOL HCL ER 80 MG PO CP24: 80.0000 mg | ORAL_CAPSULE | Freq: Every day | ORAL | Status: DC

## 2014-05-01 MED ORDER — APIXABAN 5 MG PO TABS: 5.0000 mg | ORAL_TABLET | Freq: Two times a day (BID) | ORAL | Status: DC

## 2014-05-01 NOTE — Progress Notes (Signed)
Patient ID: Gina Frazier, female    DOB: [birth year], 69 y.o.   MRN: [id number]  HPI Comments: Gina Frazier is a 69 year old woman seen several years ago in 2012 for acute hypertension in the dentist chair, recently presenting to the hospital with new onset atrial fibrillation on 04/28/2014, started on Cardizem, Lopressor converting to normal sinus rhythm.. Prior episode 6 months prior. She was started on anticoagulation with eliquis 5 mg by mouth twice a day, also started on flecainide 50 mid grams twice a day, continued on beta blocker.  In follow-up today from the hospital she reports that she is feeling well. She would like to change back to propranolol extended release as this helped her tremor better than metoprolol tartrate. She denies any significant shortness of breath or chest pain. Family who presents with her today reports she has significant snoring at nighttime. This has been a chronic issue  Echocardiogram from the hospital shows ejection fraction 60-65%, otherwise normal study  EKG on today's visit shows normal sinus rhythm with rate 57 bpm, no significant ST or T-wave changes   Allergies  Allergen Reactions  . Fish Allergy     REACTION: vomitting, swelling    Outpatient Encounter Prescriptions as of 05/01/2014  Medication Sig  . acetaminophen (TYLENOL) 500 MG tablet Take 500 mg by mouth every 6 (six) hours as needed.  Marland Kitchen apixaban (ELIQUIS) 5 MG TABS tablet Take 1 tablet (5 mg total) by mouth 2 (two) times daily.  . Calcium Carbonate-Vitamin D (CALCIUM 600 + D PO) Take by mouth daily.    . Cholecalciferol (VITAMIN D) 2000 UNITS tablet Take 4,000 Units by mouth daily.   . flecainide (TAMBOCOR) 50 MG tablet Take 1 tablet (50 mg total) by mouth 2 (two) times daily.  Marland Kitchen glucosamine-chondroitin 500-400 MG tablet Take 1 tablet by mouth 2 (two) times daily.    . nitroGLYCERIN (NITROSTAT) 0.4 MG SL tablet Place 1 tablet (0.4 mg total) under the tongue every 5 (five) minutes as  needed for chest pain.  . Pediatric Multiple Vitamins (CHEWABLE MULTIPLE VITAMINS PO) Take by mouth daily.  . Turmeric Curcumin 500 MG CAPS Take 500 mg by mouth daily.  . [DISCONTINUED] apixaban (ELIQUIS) 5 MG TABS tablet Take 5 mg by mouth 2 (two) times daily.  . [DISCONTINUED] flecainide (TAMBOCOR) 50 MG tablet Take 50 mg by mouth 2 (two) times daily.  . [DISCONTINUED] metoprolol (LOPRESSOR) 50 MG tablet Take 50 mg by mouth 2 (two) times daily.  . propranolol (INDERAL) 20 MG tablet Take 1 tablet (20 mg total) by mouth 3 (three) times daily as needed.  . propranolol ER (INDERAL LA) 80 MG 24 hr capsule Take 1 capsule (80 mg total) by mouth daily.  . traZODone (DESYREL) 50 MG tablet Take 1 tablet (50 mg total) by mouth at bedtime.  . [DISCONTINUED] Ascorbic Acid (VITAMIN C) 500 MG tablet Take 500 mg by mouth daily.    . [DISCONTINUED] propranolol ER (INDERAL LA) 80 MG 24 hr capsule Take 1 capsule (80 mg total) by mouth daily. (Patient not taking: Reported on 05/01/2014)    Past Medical History  Diagnosis Date  . Hypertension   . Palpitations   . Osteoarthritis   . Chronic back pain   . Hip joint pain   . Anxiety     Past Surgical History  Procedure Laterality Date  . Cholecystectomy    . Tooth extraction      Social History  reports that she has never smoked. She has  never used smokeless tobacco. She reports that she does not drink alcohol or use illicit drugs.  Family History family history includes Arthritis in her daughter; Depression in her sister; Diabetes in her mother and sister; GER disease in her daughter; Heart disease in her brother, father, and mother; Hypertension in her brother, mother, and sister; Hypothyroidism in her daughter; Mental illness in her mother.   Review of Systems  Constitutional: Negative.   Respiratory: Negative.   Cardiovascular: Negative.   Gastrointestinal: Negative.   Musculoskeletal: Negative.   Skin: Negative.   Neurological: Negative.    Hematological: Negative.   Psychiatric/Behavioral: Negative.   All other systems reviewed and are negative.  BP 138/72 mmHg  Pulse 57  Ht 5\' 2"  (1.575 m)  Wt 162 lb 8 oz (73.71 kg)  BMI 29.71 kg/m2   Physical Exam  Constitutional: She is oriented to person, place, and time. She appears well-developed and well-nourished.  HENT:  Head: Normocephalic.  Nose: Nose normal.  Mouth/Throat: Oropharynx is clear and moist.  Eyes: Conjunctivae are normal. Pupils are equal, round, and reactive to light.  Neck: Normal range of motion. Neck supple. No JVD present.  Cardiovascular: Normal rate, regular rhythm, S1 normal, S2 normal, normal heart sounds and intact distal pulses.  Exam reveals no gallop and no friction rub.   No murmur heard. Pulmonary/Chest: Effort normal and breath sounds normal. No respiratory distress. She has no wheezes. She has no rales. She exhibits no tenderness.  Abdominal: Soft. Bowel sounds are normal. She exhibits no distension. There is no tenderness.  Musculoskeletal: Normal range of motion. She exhibits no edema or tenderness.  Lymphadenopathy:    She has no cervical adenopathy.  Neurological: She is alert and oriented to person, place, and time. Coordination normal.  Skin: Skin is warm and dry. No rash noted. No erythema.  Psychiatric: She has a normal mood and affect. Her behavior is normal. Judgment and thought content normal.    Assessment and Plan  Nursing note and vitals reviewed.

## 2014-05-01 NOTE — Assessment & Plan Note (Signed)
Recent hospitalization for atrial fibrillation, quickly converting to normal sinus rhythm with medical management. Prior episode 6 months ago per the patient. Uncertain if this is triggered by her heavy snoring. Suggested she talk with her dentist about a mouth guard for her snoring.  Suggested she stay on her propranolol ER 80 mg daily, flecainide 50 mg twice a day and anticoagulation. We have given her propranolol 20 g pills to take as needed for breakthrough atrial fibrillation.

## 2014-05-01 NOTE — Assessment & Plan Note (Signed)
At her request we will change her back to propranolol ER 80 mg daily. Hold the metoprolol tartrate

## 2014-05-01 NOTE — Assessment & Plan Note (Signed)
This is possibly a trigger for her atrial fibrillation. Unclear if she needs a sleep study. Recommended she try a mouth guard through her dentist first to see if this helps. Family denies periods of apnea

## 2014-05-01 NOTE — Patient Instructions (Addendum)
Please take propranolol 80 mg daily Take the eliquis 5 mg twice a day (blood thinner) Take flecainide 50 mg twice a day  For atrial fibrillation epsiode: Take propranolol 20 mg x1 pill Take extra flecainide 1 to 2 pills  Relax/rest  Please call us if you have new issues that need to be addressed before your next appt.  Your physician wants you to follow-up in: 6 months.  You will receive a reminder letter in the mail two months in advance. If you don't receive a letter, please call our office to schedule the follow-up appointment.

## 2014-05-21 ENCOUNTER — Emergency Department: Payer: Self-pay | Admitting: Emergency Medicine

## 2014-06-01 ENCOUNTER — Telehealth: Payer: Self-pay | Admitting: Cardiovascular Disease

## 2014-06-01 NOTE — Telephone Encounter (Signed)
Daughter dropped off Pre op clearance form for Knee Surgery to be scheduled after form is signed and returned.  Please call patient daughter when form is faxed to Surgical office.  Form left in Nurse inbox.

## 2014-06-04 NOTE — Telephone Encounter (Signed)
Received cardiac clearance request for pt to proceed  W/ Left knee arthroscopy. Per [name], PA, pt is cleared to proceed, "hold Eliquis 2 days prior to procedure , restart 24 hrs post procedure or ok by ortho". Faxed to [employer] at [phone].

## 2014-06-12 ENCOUNTER — Ambulatory Visit: Payer: Self-pay | Admitting: Orthopedic Surgery

## 2014-06-19 ENCOUNTER — Ambulatory Visit: Payer: Self-pay | Admitting: Orthopedic Surgery

## 2014-06-19 NOTE — H&P (Signed)
Gina Frazier is an 69 y.o. female.   Chief Complaint: Left knee pain HPI: The patient is a 69 year old female who presents today for follow up of their knee. The patient is being followed for their left knee pain. They are now 6 week(s) out from most recent flare up. Symptoms reported today include: pain, pain after sitting, locking, grinding, instability, difficulty ambulating and difficulty arising from chair. The patient presents today following MRI. The patient has reported improvement of their symptoms with: Cortisone injections.  The patient follows up. She has occasional locking, giving way, and pain in the knee.  Past Medical History  Diagnosis Date  . Hypertension   . Palpitations   . Osteoarthritis   . Chronic back pain   . Hip joint pain   . Anxiety     Past Surgical History  Procedure Laterality Date  . Cholecystectomy    . Tooth extraction      Family History  Problem Relation Age of Onset  . Heart disease Mother   . Hypertension Mother   . Mental illness Mother   . Diabetes Mother   . Heart disease Father   . Hypertension Sister   . Depression Sister   . Diabetes Sister   . Heart disease Brother   . Hypertension Brother   . Arthritis Daughter   . GER disease Daughter   . Hypothyroidism Daughter    Social History:  reports that she has never smoked. She has never used smokeless tobacco. She reports that she does not drink alcohol or use illicit drugs.  Allergies:  Allergies  Allergen Reactions  . Fish Allergy     REACTION: vomitting, swelling     (Not in a hospital admission)  No results found for this or any previous visit (from the past 48 hour(s)). No results found.  Review of Systems  Constitutional: Negative.   HENT: Negative.   Eyes: Negative.   Respiratory: Negative.   Cardiovascular: Negative.   Gastrointestinal: Negative.   Genitourinary: Negative.   Musculoskeletal: Positive for joint pain.  Skin: Negative.   Neurological:  Negative.   Psychiatric/Behavioral: Negative.     There were no vitals taken for this visit. Physical Exam  Constitutional: She is oriented to person, place, and time. She appears well-developed and well-nourished.  HENT:  Head: Normocephalic and atraumatic.  Eyes: Conjunctivae and EOM are normal. Pupils are equal, round, and reactive to light.  Neck: Normal range of motion. Neck supple.  Cardiovascular: Normal rate and regular rhythm.   Respiratory: Effort normal and breath sounds normal.  GI: Soft. Bowel sounds are normal.  Musculoskeletal:  On exam tender along the lateral joint line. Positive McMurray's. Mild effusion.  Neurological: She is alert and oriented to person, place, and time. She has normal reflexes.  Skin: Skin is warm and dry.  Psychiatric: She has a normal mood and affect.    On MRI she has a flap tear anterior horn of the lateral meniscus with fragment displaced superiorly from the anterior root, horizontal tear of the anterior body, chondromalacia lateral tibial plateau.  Assessment/Plan Symptomatic lateral meniscus tear with mechanical symptoms of giving way despite rest, activity modifications, exercises, and injection.  1. Given the mechanical symptoms, presents of displaced tear of the meniscus, we discussed arthroscopy and partial lateral meniscectomy.  I had a long discussion with the patient concerning the risks and benefits of knee arthroscopy including help from the arthroscopic procedure as well as no help from the arthroscopic procedure or  worsening of symptoms. Also discussed infection, DVT, PE, anesthetic complications, etc. Also discussed the possibility of repeat arthroscopic surgery required in the future or total knee replacement. I provided the patient with an illustrated handout and discussed it in detail as well as discussed the postoperative and perioperative courses and return to functional activities including work. Need for postoperative DVT  prophylaxis was discussed as well.  2. Quad strengthening and ice. 3. Aspirin afterwards. 4. She is on Eliquis for atrial fibrillation. She is seen at [facility] She would have to be off her Eliquis, she could take aspirin preoperatively and perioperatively, hopefully that will be satisfactory. She can resume her Eliquis postoperative day one. She needs just to be off of it the day before.  Plan Left knee arthroscopy, partial lateral meniscectomy  Kylon Philbrook M. PA-C for Dr. Tonita Cong 06/19/2014, 8:18 AM

## 2014-06-20 ENCOUNTER — Encounter (HOSPITAL_COMMUNITY): Payer: Self-pay

## 2014-06-20 ENCOUNTER — Encounter (HOSPITAL_COMMUNITY)
Admission: RE | Admit: 2014-06-20 | Discharge: 2014-06-20 | Disposition: A | Discharge status: Home / Self Care | Payer: BLUE CROSS/BLUE SHIELD | Source: Ambulatory Visit | Attending: Specialist | Admitting: Specialist

## 2014-06-20 DIAGNOSIS — Z01812 Encounter for preprocedural laboratory examination: Secondary | ICD-10-CM | POA: Insufficient documentation

## 2014-06-20 HISTORY — DX: Family history of other specified conditions: Z84.89

## 2014-06-20 LAB — APTT: APTT: 35 s (ref 24–37)

## 2014-06-20 LAB — CBC
HCT: 41.1 % (ref 36.0–46.0)
HEMOGLOBIN: 13.3 g/dL (ref 12.0–15.0)
MCH: 29.5 pg (ref 26.0–34.0)
MCHC: 32.4 g/dL (ref 30.0–36.0)
MCV: 91.1 fL (ref 78.0–100.0)
PLATELETS: 244 10*3/uL (ref 150–400)
RBC: 4.51 MIL/uL (ref 3.87–5.11)
RDW: 13.4 % (ref 11.5–15.5)
WBC: 7 10*3/uL (ref 4.0–10.5)

## 2014-06-20 LAB — BASIC METABOLIC PANEL
ANION GAP: 7 (ref 5–15)
BUN: 21 mg/dL (ref 6–23)
CALCIUM: 9.2 mg/dL (ref 8.4–10.5)
CO2: 30 mmol/L (ref 19–32)
CREATININE: 0.75 mg/dL (ref 0.50–1.10)
Chloride: 101 mmol/L (ref 96–112)
GFR, EST NON AFRICAN AMERICAN: 84 mL/min — AB (ref >90)
GLUCOSE: 97 mg/dL (ref 70–99)
POTASSIUM: 4.4 mmol/L (ref 3.5–5.1)
Sodium: 138 mmol/L (ref 135–145)

## 2014-06-20 LAB — PROTIME-INR
INR: 1.08 (ref 0.00–1.49)
Prothrombin Time: 14.1 seconds (ref 11.6–15.2)

## 2014-06-20 NOTE — Patient Instructions (Addendum)
47 Gina Frazier  06/20/2014   Your procedure is scheduled on:     Thursday June 28, 2014   Report to [facility] Main Entrance and follow signs to  [facility] arrive at 8:00 AM.   Call this number if you have problems the morning of surgery [phone] or Presurgical Testing [phone].   Remember:  Do not eat food or drink liquids :After Midnight.  For Living Will and/or Health Care Power Attorney Forms: please provide copy for your medical record, may bring AM of surgery (forms should be already notarized-we do not provide this service).     Take these medicines the morning of surgery with A SIP OF WATER: Flecainide;Propranolol                               You may not have any metal on your body including hair pins and piercings  Do not wear jewelry, make-up, lotions, powders, prefumes or deodorant. No [place]ail [place]   Do not shave body hair  48 hours(2 days) of CHG soap use.              Do not bring valuables to the hospital. [place].  Contacts, dentures or bridgework may not be worn into surgery.  Leave suitcase in the car. After surgery it may be brought to your room.  For patients admitted to the hospital, checkout time is 11:00 AM the day of discharge.   Patients discharged the day of surgery will not be allowed to drive home.  Name and phone number of your driver:Gina Frazier (daughter)   Special Instructions: review fact sheets for Incentive Spirometry. ________________________________________________________________________  [facility] - Preparing for Surgery Before surgery, you can play an important role.  Because skin is not sterile, your skin needs to be as free of germs as possible.  You can reduce the number of germs on your skin by washing with CHG (chlorahexidine gluconate) soap before surgery.  CHG is an antiseptic cleaner which kills germs and bonds with the skin to continue killing germs even after  washing. Please DO NOT use if you have an allergy to CHG or antibacterial soaps.  If your skin becomes reddened/irritated stop using the CHG and inform your nurse when you arrive at Short Stay. Do not shave (including legs and underarms) for at least 48 hours prior to the first CHG shower.  You may shave your face/neck. Please follow these instructions carefully:  1.  Shower with CHG Soap the night before surgery and the  morning of Surgery.  2.  If you choose to wash your hair, wash your hair first as usual with your  normal  shampoo.  3.  After you shampoo, rinse your hair and body thoroughly to remove the  shampoo.                           4.  Use CHG as you would any other liquid soap.  You can apply chg directly  to the skin and wash                       Gently with a scrungie or clean washcloth.  5.  Apply the CHG Soap to your body ONLY FROM THE NECK DOWN.   Do not use on face/ open  Wound or open sores. Avoid contact with eyes, ears mouth and genitals (private parts).                       Wash face,  Genitals (private parts) with your normal soap.             6.  Wash thoroughly, paying special attention to the area where your surgery  will be performed.  7.  Thoroughly rinse your body with warm water from the neck down.  8.  DO NOT shower/wash with your normal soap after using and rinsing off  the CHG Soap.                9.  Pat yourself dry with a clean towel.            10.  Wear clean pajamas.            11.  Place clean sheets on your bed the night of your first shower and do not  sleep with pets. Day of Surgery : Do not apply any lotions/deodorants the morning of surgery.  Please wear clean clothes to the hospital/surgery center.  FAILURE TO FOLLOW THESE INSTRUCTIONS MAY RESULT IN THE CANCELLATION OF YOUR SURGERY PATIENT SIGNATURE_________________________________  NURSE  SIGNATURE__________________________________  ________________________________________________________________________   Adam Phenix  An incentive spirometer is a tool that can help keep your lungs clear and active. This tool measures how well you are filling your lungs with each breath. Taking long deep breaths may help reverse or decrease the chance of developing breathing (pulmonary) problems (especially infection) following:  A long period of time when you are unable to move or be active. BEFORE THE PROCEDURE   If the spirometer includes an indicator to show your best effort, your nurse or respiratory therapist will set it to a desired goal.  If possible, sit up straight or lean slightly forward. Try not to slouch.  Hold the incentive spirometer in an upright position. INSTRUCTIONS FOR USE  1. Sit on the edge of your bed if possible, or sit up as far as you can in bed or on a chair. 2. Hold the incentive spirometer in an upright position. 3. Breathe out normally. 4. Place the mouthpiece in your mouth and seal your lips tightly around it. 5. Breathe in slowly and as deeply as possible, raising the piston or the ball toward the top of the column. 6. Hold your breath for 3-5 seconds or for as long as possible. Allow the piston or ball to fall to the bottom of the column. 7. Remove the mouthpiece from your mouth and breathe out normally. 8. Rest for a few seconds and repeat Steps 1 through 7 at least 10 times every 1-2 hours when you are awake. Take your time and take a few normal breaths between deep breaths. 9. The spirometer may include an indicator to show your best effort. Use the indicator as a goal to work toward during each repetition. 10. After each set of 10 deep breaths, practice coughing to be sure your lungs are clear. If you have an incision (the cut made at the time of surgery), support your incision when coughing by placing a pillow or rolled up towels firmly  against it. Once you are able to get out of bed, walk around indoors and cough well. You may stop using the incentive spirometer when instructed by your caregiver.  RISKS AND COMPLICATIONS  Take your time so you do not  get dizzy or light-headed.  If you are in pain, you may need to take or ask for pain medication before doing incentive spirometry. It is harder to take a deep breath if you are having pain. AFTER USE  Rest and breathe slowly and easily.  It can be helpful to keep track of a log of your progress. Your caregiver can provide you with a simple table to help with this. If you are using the spirometer at home, follow these instructions: [place] IF:   You are having difficultly using the spirometer.  You have trouble using the spirometer as often as instructed.  Your pain medication is not giving enough relief while using the spirometer.  You develop fever of 100.5 F (38.1 C) or higher. SEEK IMMEDIATE MEDICAL CARE IF:   You cough up bloody sputum that had not been present before.  You develop fever of 102 F (38.9 C) or greater.  You develop worsening pain at or near the incision site. MAKE SURE YOU:   Understand these instructions.  Will watch your condition.  Will get help right away if you are not doing well or get worse. Document Released: 07/13/2006 Document Revised: 05/25/2011 Document Reviewed: 09/13/2006 [facility] Patient Information 2014 [place], [place].   ________________________________________________________________________

## 2014-06-20 NOTE — Progress Notes (Signed)
EKG per epic 05/01/2014  Clearance mentioned per telephone note / epic 06/01/2014 LOV note per epic per Dr Rockey Situ 05/01/2014  Mentioned pt having ECHO preformed per hospital / no date but EF noted of 60-65 percent per OV note per Dr Rockey Situ 05/01/2014

## 2014-06-28 ENCOUNTER — Ambulatory Visit (HOSPITAL_COMMUNITY): Payer: BLUE CROSS/BLUE SHIELD | Admitting: Certified Registered Nurse Anesthetist

## 2014-06-28 ENCOUNTER — Encounter (HOSPITAL_COMMUNITY): Payer: Self-pay | Admitting: *Deleted

## 2014-06-28 ENCOUNTER — Encounter (HOSPITAL_COMMUNITY)
Admission: RE | Disposition: A | Discharge status: Home / Self Care | Payer: Self-pay | Source: Ambulatory Visit | Attending: Specialist

## 2014-06-28 ENCOUNTER — Ambulatory Visit (HOSPITAL_COMMUNITY)
Admission: RE | Admit: 2014-06-28 | Discharge: 2014-06-28 | Disposition: A | Discharge status: Home / Self Care | Payer: BLUE CROSS/BLUE SHIELD | Source: Ambulatory Visit | Attending: Specialist | Admitting: Specialist

## 2014-06-28 DIAGNOSIS — Z9049 Acquired absence of other specified parts of digestive tract: Secondary | ICD-10-CM | POA: Insufficient documentation

## 2014-06-28 DIAGNOSIS — F419 Anxiety disorder, unspecified: Secondary | ICD-10-CM | POA: Diagnosis not present

## 2014-06-28 DIAGNOSIS — I10 Essential (primary) hypertension: Secondary | ICD-10-CM | POA: Diagnosis not present

## 2014-06-28 DIAGNOSIS — M2242 Chondromalacia patellae, left knee: Secondary | ICD-10-CM | POA: Diagnosis not present

## 2014-06-28 DIAGNOSIS — Z91013 Allergy to seafood: Secondary | ICD-10-CM | POA: Insufficient documentation

## 2014-06-28 DIAGNOSIS — M23252 Derangement of posterior horn of lateral meniscus due to old tear or injury, left knee: Secondary | ICD-10-CM | POA: Diagnosis not present

## 2014-06-28 DIAGNOSIS — M179 Osteoarthritis of knee, unspecified: Secondary | ICD-10-CM | POA: Diagnosis not present

## 2014-06-28 DIAGNOSIS — I4891 Unspecified atrial fibrillation: Secondary | ICD-10-CM | POA: Diagnosis not present

## 2014-06-28 DIAGNOSIS — E559 Vitamin D deficiency, unspecified: Secondary | ICD-10-CM | POA: Insufficient documentation

## 2014-06-28 DIAGNOSIS — S83282A Other tear of lateral meniscus, current injury, left knee, initial encounter: Secondary | ICD-10-CM

## 2014-06-28 HISTORY — PX: KNEE ARTHROSCOPY: SHX127

## 2014-06-28 SURGERY — ARTHROSCOPY, KNEE
Anesthesia: General | Site: Neck | Laterality: Left

## 2014-06-28 MED ORDER — OXYCODONE HCL 5 MG/5ML PO SOLN
5.0000 mg | Freq: Once | ORAL | Status: DC | PRN
  Filled 2014-06-28: qty 5

## 2014-06-28 MED ORDER — PROMETHAZINE HCL 25 MG/ML IJ SOLN: 6.2500 mg | INTRAMUSCULAR | Status: DC | PRN

## 2014-06-28 MED ORDER — HYDROMORPHONE HCL 1 MG/ML IJ SOLN: 0.2500 mg | INTRAMUSCULAR | Status: DC | PRN

## 2014-06-28 MED ORDER — HYDROCODONE-ACETAMINOPHEN 5-325 MG PO TABS: 1.0000 | ORAL_TABLET | ORAL | Status: DC | PRN

## 2014-06-28 MED ORDER — PROPOFOL 10 MG/ML IV BOLUS
INTRAVENOUS | Status: DC | PRN
  Administered 2014-06-28: 150 mg via INTRAVENOUS

## 2014-06-28 MED ORDER — EPHEDRINE SULFATE 50 MG/ML IJ SOLN
INTRAMUSCULAR | Status: DC | PRN
  Administered 2014-06-28: 5 mg via INTRAVENOUS

## 2014-06-28 MED ORDER — CEFAZOLIN SODIUM-DEXTROSE 2-3 GM-% IV SOLR
INTRAVENOUS | Status: AC
  Filled 2014-06-28: qty 50

## 2014-06-28 MED ORDER — BUPIVACAINE-EPINEPHRINE 0.5% -1:200000 IJ SOLN
INTRAMUSCULAR | Status: DC | PRN
  Administered 2014-06-28: 17 mL

## 2014-06-28 MED ORDER — LACTATED RINGERS IV SOLN
INTRAVENOUS | Status: DC
  Administered 2014-06-28: 1000 mL via INTRAVENOUS

## 2014-06-28 MED ORDER — CEFAZOLIN SODIUM-DEXTROSE 2-3 GM-% IV SOLR
2.0000 g | INTRAVENOUS | Status: AC
  Administered 2014-06-28: 2 g via INTRAVENOUS

## 2014-06-28 MED ORDER — EPINEPHRINE HCL 1 MG/ML IJ SOLN
INTRAMUSCULAR | Status: DC | PRN
  Administered 2014-06-28: 1 mg

## 2014-06-28 MED ORDER — FENTANYL CITRATE 0.05 MG/ML IJ SOLN
INTRAMUSCULAR | Status: AC
  Filled 2014-06-28: qty 2

## 2014-06-28 MED ORDER — MIDAZOLAM HCL 5 MG/5ML IJ SOLN
INTRAMUSCULAR | Status: DC | PRN
  Administered 2014-06-28: 2 mg via INTRAVENOUS

## 2014-06-28 MED ORDER — MIDAZOLAM HCL 2 MG/2ML IJ SOLN
INTRAMUSCULAR | Status: AC
  Filled 2014-06-28: qty 2

## 2014-06-28 MED ORDER — SODIUM CHLORIDE 0.9 % IJ SOLN
INTRAMUSCULAR | Status: AC
  Filled 2014-06-28: qty 10

## 2014-06-28 MED ORDER — ONDANSETRON HCL 4 MG/2ML IJ SOLN
INTRAMUSCULAR | Status: AC
  Filled 2014-06-28: qty 2

## 2014-06-28 MED ORDER — OXYCODONE HCL 5 MG PO TABS: 5.0000 mg | ORAL_TABLET | Freq: Once | ORAL | Status: DC | PRN

## 2014-06-28 MED ORDER — BUPIVACAINE-EPINEPHRINE 0.5% -1:200000 IJ SOLN
INTRAMUSCULAR | Status: AC
  Filled 2014-06-28: qty 1

## 2014-06-28 MED ORDER — PROPOFOL 10 MG/ML IV BOLUS
INTRAVENOUS | Status: AC
  Filled 2014-06-28: qty 20

## 2014-06-28 MED ORDER — FENTANYL CITRATE 0.05 MG/ML IJ SOLN
INTRAMUSCULAR | Status: DC | PRN
  Administered 2014-06-28 (×2): 50 ug via INTRAVENOUS

## 2014-06-28 MED ORDER — EPINEPHRINE HCL 1 MG/ML IJ SOLN
INTRAMUSCULAR | Status: AC
  Filled 2014-06-28: qty 2

## 2014-06-28 MED ORDER — EPHEDRINE SULFATE 50 MG/ML IJ SOLN
INTRAMUSCULAR | Status: AC
  Filled 2014-06-28: qty 1

## 2014-06-28 MED ORDER — LIDOCAINE HCL (CARDIAC) 20 MG/ML IV SOLN
INTRAVENOUS | Status: AC
  Filled 2014-06-28: qty 5

## 2014-06-28 MED ORDER — LIDOCAINE HCL (CARDIAC) 20 MG/ML IV SOLN
INTRAVENOUS | Status: DC | PRN
  Administered 2014-06-28: 100 mg via INTRAVENOUS

## 2014-06-28 SURGICAL SUPPLY — 23 items
BANDAGE ELASTIC 6 VELCRO ST LF (GAUZE/BANDAGES/DRESSINGS) ×3 IMPLANT
BLADE 4.2CUDA (BLADE) IMPLANT
BLADE CUDA SHAVER 3.5 (BLADE) ×3 IMPLANT
BLADE SURG SZ11 CARB STEEL (BLADE) IMPLANT
CLOTH 2% CHLOROHEXIDINE 3PK (PERSONAL CARE ITEMS) ×3 IMPLANT
DRSG EMULSION OIL 3X3 NADH (GAUZE/BANDAGES/DRESSINGS) ×3 IMPLANT
DRSG PAD ABDOMINAL 8X10 ST (GAUZE/BANDAGES/DRESSINGS) IMPLANT
DURAPREP 26ML APPLICATOR (WOUND CARE) ×3 IMPLANT
GAUZE SPONGE 4X4 12PLY STRL (GAUZE/BANDAGES/DRESSINGS) ×3 IMPLANT
GLOVE BIOGEL PI IND STRL 7.5 (GLOVE) ×1 IMPLANT
GLOVE BIOGEL PI INDICATOR 7.5 (GLOVE) ×2
GLOVE SURG SS PI 7.5 STRL IVOR (GLOVE) IMPLANT
GLOVE SURG SS PI 8.0 STRL IVOR (GLOVE) ×6 IMPLANT
GOWN STRL REUS W/TWL XL LVL3 (GOWN DISPOSABLE) ×3 IMPLANT
KIT BASIN OR (CUSTOM PROCEDURE TRAY) ×3 IMPLANT
MANIFOLD NEPTUNE II (INSTRUMENTS) ×3 IMPLANT
PACK ARTHROSCOPY WL (CUSTOM PROCEDURE TRAY) ×3 IMPLANT
PADDING CAST COTTON 6X4 STRL (CAST SUPPLIES) ×3 IMPLANT
SET ARTHROSCOPY TUBING (MISCELLANEOUS) ×2
SET ARTHROSCOPY TUBING LN (MISCELLANEOUS) ×1 IMPLANT
SUT ETHILON 4 0 PS 2 18 (SUTURE) ×3 IMPLANT
WAND 90 DEG TURBOVAC W/CORD (SURGICAL WAND) IMPLANT
WRAP KNEE MAXI GEL POST OP (GAUZE/BANDAGES/DRESSINGS) ×3 IMPLANT

## 2014-06-28 NOTE — Anesthesia Procedure Notes (Signed)
Procedure Name: LMA Insertion Date/Time: 06/28/2014 10:04 AM Performed by: Maxwell Caul Pre-anesthesia Checklist: Patient identified, Emergency Drugs available, Suction available and Patient being monitored Patient Re-evaluated:Patient Re-evaluated prior to inductionOxygen Delivery Method: Circle system utilized Preoxygenation: Pre-oxygenation with 100% oxygen Intubation Type: IV induction LMA: LMA inserted LMA Size: 4.0 Number of attempts: 1 Tube secured with: Tape Dental Injury: Teeth and Oropharynx as per pre-operative assessment

## 2014-06-28 NOTE — Brief Op Note (Signed)
06/28/2014  10:41 AM  PATIENT:  Gina Frazier  69 y.o. female  PRE-OPERATIVE DIAGNOSIS:  LEFT KNEE LATERAL MENISCUS TEAR  POST-OPERATIVE DIAGNOSIS:  EFT KNEE LATERAL MENISCUS TEAR  PROCEDURE:  Procedure(s): ARTHROSCOPY KNEE WITH PARTIALLATERAL MENISECTOMY AND DEBRIDEMENT (Left)  SURGEON:  Surgeon(s) and Role:    * Susa Day, MD - Primary  PHYSICIAN ASSISTANT:   ASSISTANTS: [place]    ANESTHESIA:   general  EBL:     BLOOD ADMINISTERED:none  DRAINS: none   LOCAL MEDICATIONS USED:  MARCAINE     SPECIMEN:  No Specimen  DISPOSITION OF SPECIMEN:  N/A  COUNTS:  YES  TOURNIQUET:  * No tourniquets in log *  DICTATION: .Other Dictation: Dictation Number [id number]  PLAN OF CARE: Discharge to home after PACU  PATIENT DISPOSITION:  PACU - hemodynamically stable.   Delay start of Pharmacological VTE agent (>24hrs) due to surgical blood loss or risk of bleeding: no

## 2014-06-28 NOTE — Transfer of Care (Signed)
Immediate Anesthesia Transfer of Care Note  Patient: Gina Frazier  Procedure(s) Performed: Procedure(s): ARTHROSCOPY KNEE WITH PARTIALLATERAL MENISECTOMY AND DEBRIDEMENT (Left)  Patient Location: PACU  Anesthesia Type:General  Level of Consciousness:  sedated, patient cooperative and responds to stimulation  Airway & Oxygen Therapy:Patient Spontanous Breathing and Patient connected to face mask oxgen  Post-op Assessment:  Report given to PACU RN and Post -op Vital signs reviewed and stable  Post vital signs:  Reviewed and stable  Last Vitals:  Filed Vitals:   06/28/14 0809  BP: 182/74  Pulse: 63  Temp: 36.5 C  Resp: 20    Complications: No apparent anesthesia complications

## 2014-06-28 NOTE — Op Note (Signed)
NAME:  Gina Frazier, Gina Frazier              ACCOUNT NO.:  [id number]  MEDICAL RECORD NO.:  [id number]  LOCATION:  [facility]                         FACILITY:  [facility]  PHYSICIAN:  Susa Day, M.D.    DATE OF BIRTH:  05-Aug-1945  DATE OF PROCEDURE:  06/28/2014 DATE OF DISCHARGE:  06/28/2014                              OPERATIVE REPORT   PREOPERATIVE DIAGNOSIS:  Lateral meniscus tear, degenerative joint disease, left knee.  POSTOPERATIVE DIAGNOSES:  Grade 3 chondromalacia patella, grade 3 chondromalacia lateral tibial plateau, complex lateral meniscus tear.  PROCEDURES PERFORMED: 1. Left knee arthroscopy. 2. Partial lateral meniscectomy and complex tear. 3. Chondroplasty of patella, lateral tibial plateau, and femoral     condyle.  ANESTHESIA:  General.  ASSISTANT:  Cleophas Dunker, PA, was used due to lateral compartment tear and for holding in the figure-of-four position.  HISTORY:  A 69 year old, locking, popping, and giving way.  MRI indicating flap tear lateral meniscus, indicated for knee arthroscopy, partial lateral meniscectomy and debridement.  Risks and benefits discussed including bleeding, infection, damage to neurovascular structure, DVT, PE, anesthetic complications, etc.  TECHNIQUE:  With the patient in supine position, after induction of adequate general anesthesia, 1 g Kefzol, left lower extremity was prepped and draped in usual sterile fashion.  A lateral parapatellar portal was fashioned with a #11 blade.  Ingress cannula atraumatically placed.  Irrigant was utilized to insufflate the joint.  Under direct visualization, medial parapatellar portal was fashioned with a #11 blade after localization with 18-gauge needle, sparing the medial meniscus. Noted medially was essentially some minor grade 2 changes of femoral condyle, tibial plateau, and meniscus.  This was stable to probe palpation without evidence of tear.  ACL was unremarkable.  There was extensive tearing of  the lateral meniscus, extending from posterior to anterior, and tear off the anterior attachment.  I introduced a basket and resected 50% of the lateral meniscus to a stable base, further contoured with a 3.5 Cuda shaver and anteriorly, the fragment was noted as well.  There was some residual attachment anteriorly, however, this was a complex tear, cleaving multiple planes.  There was extensive grade 3 changes in the lateral tibial plateau, light chondroplasty performed here.  Grade 3 changes of the femur, light chondroplasty performed here. The remnant meniscus was stable to probe palpation.  Suprapatellar pouch was examined.  There was normal patellofemoral tracking.  Gutters were unremarkable.  Grade 3 change of patella, light chondroplasty performed here.  I revisited all compartments, no further pathology amenable to arthroscopic intervention.  I, therefore, removed all instrumentation. Portals were closed with 4-0 nylon simple sutures.  Marcaine 0.25% with epinephrine was infiltrated in the joint.  Wound was dressed sterilely. Awoken without difficulty and transported to the recovery room in satisfactory condition.  The patient tolerated the procedure well.  No complications.  Assistant is Cleophas Dunker, [place].  Minimal blood loss.     Susa Day, M.D.     Geralynn Rile  D:  06/28/2014  T:  06/28/2014  Job:  [id number]

## 2014-06-28 NOTE — Interval H&P Note (Signed)
History and Physical Interval Note:  06/28/2014 7:26 AM  Gina Frazier  has presented today for surgery, with the diagnosis of LEFT KNEE LATERAL MENISCUS TEAR  The various methods of treatment have been discussed with the patient and family. After consideration of risks, benefits and other options for treatment, the patient has consented to  Procedure(s): ARTHROSCOPY KNEE WITH LATERAL MENISECTOMY (Left) as a surgical intervention .  The patient's history has been reviewed, patient examined, no change in status, stable for surgery.  I have reviewed the patient's chart and labs.  Questions were answered to the patient's satisfaction.     Porter Nakama C

## 2014-06-28 NOTE — Anesthesia Postprocedure Evaluation (Signed)
  Anesthesia Post-op Note  Patient: Gina Frazier  Procedure(s) Performed: Procedure(s): ARTHROSCOPY KNEE WITH PARTIALLATERAL MENISECTOMY AND DEBRIDEMENT (Left)  Patient Location: PACU  Anesthesia Type:General  Level of Consciousness: awake and alert   Airway and Oxygen Therapy: Patient Spontanous Breathing  Post-op Pain: mild  Post-op Assessment: Post-op Vital signs reviewed  Post-op Vital Signs: stable  Last Vitals:  Filed Vitals:   06/28/14 1145  BP: 137/54  Pulse: 54  Temp: 36.4 C  Resp: 12    Complications: No apparent anesthesia complications

## 2014-06-28 NOTE — Anesthesia Preprocedure Evaluation (Addendum)
Anesthesia Evaluation  Patient identified by MRN, date of birth, ID band Patient awake    Reviewed: Allergy & Precautions, NPO status , Patient's Chart, lab work & pertinent test results  Airway Mallampati: II   Neck ROM: Full  Mouth opening: Limited Mouth Opening  Dental  (+) Missing, Partial Upper,    Pulmonary shortness of breath,  breath sounds clear to auscultation        Cardiovascular hypertension, + dysrhythmias Atrial Fibrillation Rhythm:Irregular Rate:Normal     Neuro/Psych    GI/Hepatic   Endo/Other    Renal/GU      Musculoskeletal  (+) Arthritis -, Chronic back pain    Abdominal   Peds  Hematology   Anesthesia Other Findings   Reproductive/Obstetrics                            Anesthesia Physical Anesthesia Plan  ASA: III  Anesthesia Plan: General   Post-op Pain Management:    Induction: Intravenous  Airway Management Planned: LMA  Additional Equipment:   Intra-op Plan:   Post-operative Plan: Extubation in OR  Informed Consent: I have reviewed the patients History and Physical, chart, labs and discussed the procedure including the risks, benefits and alternatives for the proposed anesthesia with the patient or authorized representative who has indicated his/her understanding and acceptance.   Dental advisory given  Plan Discussed with: CRNA and Surgeon  Anesthesia Plan Comments:         Anesthesia Quick Evaluation

## 2014-06-28 NOTE — Discharge Instructions (Signed)
Procedimiento artroscpico, rodilla (Arthroscopic Procedure, Knee) Una artroscopa podr encontrar el problema que existe en su rodilla. PROCEDIMIENTO La artroscopa es una tcnica quirrgica. Le ayudar al cirujano ortopedista a diagnosticar y tratar la lesin de la rodilla con precisin. El cirujano observa en el interior de la rodilla a travs de un pequeo dispositivo. Este instrumento es similar a un pequeo telescopio (del tamao de un lpiz). La artroscopia es menos extensa que la [place] abierta de rodilla. Puede esperarse una recuperacin ms rpida. Si sigue las indicaciones del profesional que lo asiste podr recuperarse rpida y completamente. Use las muletas, haga reposo, eleve la pierna, colquese hielo y haga los ejercicios que le han indicado. [place] de recuperacin depende de varios factores. Entre estos factores se incluyen el tipo de lesin, la edad del paciente, el estado fsico, las enfermedades que padece y el cumplimiento de las indicaciones. La rodilla es la articulacin entre los huesos largos de la pierna (el fmur y la tibia). Existen [employer] capas de cartlago que cubren los extremos de [place]. Los extremos de los huesos son suaves y Research officer, trade union. Permiten que la rodilla se doble y se mueva suavemente. Dos meniscos ayudan a Research officer, trade union y a Chief Technology Officer rodilla. Los ligamentos cumplen la funcin de [employer]. Sostienen la articulacin de la rodilla. Los meniscos son almohadillas de Database administrator grueso, de forma semilunar que forman un borde en el interior de la articulacin y actan absorbiendo los impactos. Los msculos mueven la articulacin, ayudan a Magazine features editor rodilla y se tensan en la articulacin misma. Debido a esto, la fisioterapia para rehabilitar o reparar una rodilla lesionada requiere la reconstruccin y el fortalecimiento de los msculos. DESPUS DEL PROCEDIMIENTO  Despus del procedimiento lo llevarn a un rea de recuperacin hasta que  hayan desaparecido los [employer]. El profesional [employer] de la prueba con usted.  Utilice los medicamentos de venta libre o de prescripcin para Conservation officer, historic buildings, Health and safety inspector o la [place], segn se lo indique el profesional que lo asiste. SOLICITE ATENCIN MDICA SI:  Aumenta el sangrado (ms all de una pequea mancha) en el lugar de la incisin (herida).  Presenta enrojecimiento, hinchazn o aumento del Management consultant de la incisin (herida).  Aparece pus en la herida.  La temperatura oral se eleva sin motivo por encima de 38,9 C (46 F) o segn le indique el profesional que lo asiste.  Advierte un olor ftido que proviene de la herida o del vendaje.  Siente dolor intenso al Oceanographer movimiento con la rodilla. SOLICITE ATENCIN MDICA DE INMEDIATO SI:  Presenta una erupcin cutnea.  Siente dificultad para respirar.  Tiene algn problema de alergia. Document Released: 03/02/2005 Document Revised: 05/25/2011 [facility] Patient Information 2015 [facility]. This information is not intended to replace advice given to you by your health care provider. Make sure you discuss any questions you have with your health care provider. ARTHROSCOPIC KNEE SURGERY HOME CARE INSTRUCTIONS   PAIN You will be expected to have a moderate amount of pain in the affected knee for approximately two weeks.  However, the first two to four days will be the most severe in terms of the pain you will experience.  Prescriptions have been provided for you to take as needed for the pain.  The pain can be markedly reduced by using the ice/compressive bandage given.  Exchange the ice packs whenever they thaw.  During the night, keep the bandage on because it will still  provide some compression for the swelling.  Also, keep the leg elevated on pillows above your heart, and this will help alleviate the pain and swelling.  MEDICATION Prescriptions have been provided to take as  needed for pain. To prevent blood clots, take Aspirin 325mg  daily with a meal if not on a blood thinner and if no history of stomach ulcers.  ACTIVITY It is preferred that you stay on bedrest for approximately 24 hours.  However, you may go to the bathroom with help.  After this, you can start to be up and about progressively more.  Remember that the swelling may still increase after three to four days if you are up and doing too much.  You may put as much weight on the affected leg as pain will allow.  Use your crutches for comfort and safety.  However, as soon as you are able, you may discard the crutches and go without them.   DRESSING Keep the current dressing as dry as possible.  Two days after your surgery, you may remove the ice/compressive wrap, and surgical dressing.  You may now take a shower, but do not scrub the sounds directly with soap.  Let water rinse over these and gently wipe with your hand.  Reapply band-aids over the puncture wounds and more gauze if needed.  A slight amount of thin drainage can be normal at this time, and do not let it frighten you.  Reapply the ice/compressive wrap.  You may now repeat this every day each time you shower.  SYMPTOMS TO REPORT TO YOUR DOCTOR  -Extreme pain.  -Extreme swelling.  -Temperature above 101 degrees that does not come down with acetaminophen     (Tylenol).  -Any changes in the feeling, color or movement of your toes.  -Extreme redness, heat, swelling or drainage at your incision  EXERCISE It is preferred that you begin to exercise on the day of your surgery.  Straight leg raises and short arc quads should be begun the afternoon or evening of surgery and continued until you come back for your follow-up appointment.   Attached is an instruction sheet on how to perform these two simple exercises.  Do these at least three times per day if not more.  You may bend your knee as much as is comfortable.  The puncture wounds may occasionally be  slightly uncomfortable with bending of the knee.  Do not let this frighten you.  It is important to keep your knee motion, but do not overdo it.  If you have significant pain, simply do not bend the knee as far.   You will be given more exercises to perform at your first return visit.    RETURN APPOINTMENT Please make an appointment to be seen by your doctor in 14 days from your surgery.  Patient Signature:  ________________________________________________________  Nurse's Signature:  ________________________________________________________

## 2014-06-28 NOTE — Progress Notes (Signed)
Patient instructed to call her physician to know when to start taking Eliquis.

## 2014-06-28 NOTE — H&P (View-Only) (Signed)
Gina Frazier is an 69 y.o. female.   Chief Complaint: Left knee pain HPI: The patient is a 69 year old female who presents today for follow up of their knee. The patient is being followed for their left knee pain. They are now 6 week(s) out from most recent flare up. Symptoms reported today include: pain, pain after sitting, locking, grinding, instability, difficulty ambulating and difficulty arising from chair. The patient presents today following MRI. The patient has reported improvement of their symptoms with: Cortisone injections.  The patient follows up. She has occasional locking, giving way, and pain in the knee.  Past Medical History  Diagnosis Date  . Hypertension   . Palpitations   . Osteoarthritis   . Chronic back pain   . Hip joint pain   . Anxiety     Past Surgical History  Procedure Laterality Date  . Cholecystectomy    . Tooth extraction      Family History  Problem Relation Age of Onset  . Heart disease Mother   . Hypertension Mother   . Mental illness Mother   . Diabetes Mother   . Heart disease Father   . Hypertension Sister   . Depression Sister   . Diabetes Sister   . Heart disease Brother   . Hypertension Brother   . Arthritis Daughter   . GER disease Daughter   . Hypothyroidism Daughter    Social History:  reports that she has never smoked. She has never used smokeless tobacco. She reports that she does not drink alcohol or use illicit drugs.  Allergies:  Allergies  Allergen Reactions  . Fish Allergy     REACTION: vomitting, swelling     (Not in a hospital admission)  No results found for this or any previous visit (from the past 48 hour(s)). No results found.  Review of Systems  Constitutional: Negative.   HENT: Negative.   Eyes: Negative.   Respiratory: Negative.   Cardiovascular: Negative.   Gastrointestinal: Negative.   Genitourinary: Negative.   Musculoskeletal: Positive for joint pain.  Skin: Negative.   Neurological:  Negative.   Psychiatric/Behavioral: Negative.     There were no vitals taken for this visit. Physical Exam  Constitutional: She is oriented to person, place, and time. She appears well-developed and well-nourished.  HENT:  Head: Normocephalic and atraumatic.  Eyes: Conjunctivae and EOM are normal. Pupils are equal, round, and reactive to light.  Neck: Normal range of motion. Neck supple.  Cardiovascular: Normal rate and regular rhythm.   Respiratory: Effort normal and breath sounds normal.  GI: Soft. Bowel sounds are normal.  Musculoskeletal:  On exam tender along the lateral joint line. Positive McMurray's. Mild effusion.  Neurological: She is alert and oriented to person, place, and time. She has normal reflexes.  Skin: Skin is warm and dry.  Psychiatric: She has a normal mood and affect.    On MRI she has a flap tear anterior horn of the lateral meniscus with fragment displaced superiorly from the anterior root, horizontal tear of the anterior body, chondromalacia lateral tibial plateau.  Assessment/Plan Symptomatic lateral meniscus tear with mechanical symptoms of giving way despite rest, activity modifications, exercises, and injection.  1. Given the mechanical symptoms, presents of displaced tear of the meniscus, we discussed arthroscopy and partial lateral meniscectomy.  I had a long discussion with the patient concerning the risks and benefits of knee arthroscopy including help from the arthroscopic procedure as well as no help from the arthroscopic procedure or  worsening of symptoms. Also discussed infection, DVT, PE, anesthetic complications, etc. Also discussed the possibility of repeat arthroscopic surgery required in the future or total knee replacement. I provided the patient with an illustrated handout and discussed it in detail as well as discussed the postoperative and perioperative courses and return to functional activities including work. Need for postoperative DVT  prophylaxis was discussed as well.  2. Quad strengthening and ice. 3. Aspirin afterwards. 4. She is on Eliquis for atrial fibrillation. She is seen at [facility]. She would have to be off her Eliquis, she could take aspirin preoperatively and perioperatively, hopefully that will be satisfactory. She can resume her Eliquis postoperative day one. She needs just to be off of it the day before.  Plan Left knee arthroscopy, partial lateral meniscectomy  BISSELL, JACLYN M. PA-C for Dr. Tonita Cong 06/19/2014, 8:18 AM

## 2014-06-29 ENCOUNTER — Telehealth: Payer: Self-pay | Admitting: *Deleted

## 2014-06-29 NOTE — Telephone Encounter (Signed)
Spoke w/ pt's daughter.  Advised her that per pt's cardiac clearance, pt is to resume Eliquis 24 hrs after procedure unless otherwise indicated by ortho. She verbalizes understanding and will call back w/ any questions or concerns.

## 2014-06-29 NOTE — Telephone Encounter (Signed)
Patient's daughter called wanting to know when her mother can resume her Eloquis.  She had knee surgery yesterday. Please call

## 2014-07-02 ENCOUNTER — Encounter (HOSPITAL_COMMUNITY): Payer: Self-pay | Admitting: Specialist

## 2014-07-15 NOTE — H&P (Signed)
PATIENT NAME:  Gina Frazier, Dobosz Shanica MR#:  [id number] DATE OF BIRTH:  Apr 26, 1945  DATE OF ADMISSION:  04/28/2014  REFERRING PHYSICIAN: Valli Glance. Owens Shark, MD   PRIMARY CARE PHYSICIAN: Enid Derry, MD   ADMISSION DIAGNOSIS: New onset atrial fibrillation with rapid ventricular rate.   HISTORY OF PRESENT ILLNESS: This is a 69 year old Hispanic female who presents to the Emergency Department complaining of palpitations. The patient denies any chest pain or shortness of breath, but admits that she felt slightly lightheaded at times while she had palpitations. She admits to feeling her heart beat in her neck, but denies any nausea, vomiting, or diaphoresis. In the Emergency Department, the patient was found to have a heart rate in the 130s, as well as a systolic blood pressure greater than 200. Following a Cardizem bolus and IV metoprolol, the patient's heart rate was controlled; however, telemetry continued to show telemetry. The patient admits that this is actually the second occasion when she has had palpitations but she did not tell anybody the first time, which was a few days ago. Due to sustained atrial fibrillation, the Emergency Department called for admission.   REVIEW OF SYSTEMS:  CONSTITUTIONAL: The patient denies fever or weakness.  EYES: Denies blurred vision or inflammation.  EARS, NOSE AND THROAT: Denies tinnitus or sore throat.  RESPIRATORY: Denies cough or shortness of breath.  CARDIOVASCULAR: Denies chest pain, orthopnea, or paroxysmal nocturnal dyspnea. The patient does admit to palpitations.  GASTROINTESTINAL: Denies nausea, vomiting, diarrhea, or abdominal pain.  GENITOURINARY: Denies dysuria, increased frequency, or hesitancy of urination.   INTEGUMENTARY: Denies rashes or lesions.  ENDOCRINE: Denies polyuria or polydipsia.  HEMATOLOGIC AND LYMPHATIC: Denies easy bruising or bleeding.  MUSCULOSKELETAL: Denies arthralgias or myalgias.  NEUROLOGIC: Denies numbness in her extremities or  difficulty speaking.  PSYCHIATRIC: Denies depression or suicidal ideation.   PAST MEDICAL HISTORY: Hypertension.   PAST SURGICAL HISTORY: None.   SOCIAL HISTORY: The patient does not smoke, drink, or do any drugs.   FAMILY HISTORY: Significant for hypertension and diabetes.   MEDICATIONS: Metoprolol 50 mg extend-release, multivitamin, as well as vitamin D supplement and apparently tumeric.   ALLERGIES: FISH.   PERTINENT LABORATORY RESULTS AND RADIOGRAPHIC FINDINGS: Serum glucose is 107, BUN 32, creatinine 0.77, serum sodium 145, potassium is 3.7, chloride is 108, bicarbonate 30, calcium 9.3, magnesium 2.2, albumin 3.5, alkaline phosphatase 94, AST 22, as well as ALT is 22. Troponin is negative. White blood cell count is 5.5, hemoglobin is 12.6, hematocrit 38.8, platelet count 213,000, MCV is 90. Urinalysis is negative for infection.   PHYSICAL EXAMINATION:  VITAL SIGNS: Temperature is 98.5, pulse at the time of this dictation is 91, respirations 16, blood pressure 131/79, pulse oximetry is 96% on room air.  GENERAL: The patient is alert and oriented x 3 in no apparent distress.  HEENT: Normocephalic, atraumatic. Pupils equal, round, and reactive to light and accommodation. Extraocular movements are intact. Mucous membranes are moist.  NECK: Trachea is midline. No adenopathy. Thyroid is nonpalpable and nontender.  CHEST: Symmetric and atraumatic.  CARDIOVASCULAR: Rhythm is irregularly irregular. There is normal S1 as well as a normal S2 with no rubs, clicks, or murmurs.  LUNGS: Clear to auscultation bilaterally. Normal effort and excursion.  ABDOMEN: Positive bowel sounds. Soft, nontender, nondistended. No hepatosplenomegaly.  GENITOURINARY: Deferred.  MUSCULOSKELETAL: The patient moves all 4 extremities equally. I have not tested her gait.  SKIN: No rashes or lesions. It is warm and dry.  EXTREMITIES: No clubbing, cyanosis, or edema.  NEUROLOGIC:  Cranial nerves II-XII are grossly  intact.  PSYCHIATRIC: Mood is normal. Affect is congruent. The patient has good judgment and insight into her medical condition.   ASSESSMENT AND PLAN: This is a 69 year old female admitted for new onset atrial fibrillation with rapid ventricular response, which is now resolved.  1.  Atrial fibrillation: Following Cardizem bolus and metoprolol dosing, we have achieved rate control. We will start Eliquis, as the patient admits that she has had these symptoms before. A cardiology consult has been ordered.  2.  Hypertension: The patient had reportedly been on metoprolol 50 mg extended-release at home. She is now rate controlled after a total of 100 mg of metoprolol in less than 24 hours. This will likely be her dose of going forward. We will await cardiology input on this matter.  3.  Osteoarthritis: The patient has significant difficulty ambulating due to severe pain in her knees. She definitely needs to lose weight before she is considered a candidate for total knee replacement.  4.  Overweight: The patient's BMI is 29.4. I have encouraged diet and exercise.  5.  Deep vein thrombosis prophylaxis: The patient will be on oral anticoagulation.  6.  Gastrointestinal prophylaxis: None.   CODE STATUS: The patient is a full code.   TIME SPENT ON ADMISSION ORDERS AND PATIENT CARE: Approximately 35 minutes.    ____________________________ Norva Riffle. Marcille Blanco, MD msd:bm D: 04/29/2014 00:02:25 ET T: 04/29/2014 00:24:08 ET JOB#: [id number]  cc: Norva Riffle. Marcille Blanco, MD, <Dictator> Norva Riffle DIAMOND MD ELECTRONICALLY SIGNED 04/29/2014 21:46

## 2014-07-15 NOTE — Consult Note (Signed)
General Aspect Primary Cardiologist: New to [facility] (previously seen by [facility] in 2012) _________________  69 year old female with history of HTN and palpitations who presented to [facility] overnight with sudden onset of palpitations and was found to be in new onset a-fib with RVR with heart rates into the 130s. Currently back into NSR after receiving IV diltiazem 5 mg x 2 and po Lopressor 50 mg x 1 in the ED. She was placed on Lopressor 50 mg po q 12 hours and Eliquis 5 mg bid.   _______________  PMH: 1. HTN 2. Palpitations 3. OA 4. Chronic knee pain _________________   Present Illness 69 year old female with the above problem list who presented to [facility] overnight with sudden onset of palpitations and was found to be in new onset a-fib with RVR with heart rates into the 130s. She was previously seen by [facility] cardiology in 2012 for accelerated HTN while attempting to under go some dental work. Office visit at that time showed blood pressures systolic of 494 at her dentist's office. She started on lisinopril HCT 10/21.5 mg at that time. Blood pressure did not spike until the precedure began with Novacaine. She was nervious about having her teeth pulled. She was advised to see cardiology for evaluation of her BP. At our office in 2012 for evaluation of her BP her BP was found to be 150/80. EKG showed normal sinus rhythm with rate of 66 beats a minute, no significant ST or T wave changes. Unchanged from previous EKGs. It was felt she was having some issues with anxiety and panic attacks. She was given nitro for prn usage for severe HTN. It was also suggested she contact Dr. Elizebeth Koller for a small prescription for a benzodiazepine such as Ativan or Xanax that she could take prior to her dental procedure.   She denies any prior cardiac caths. She does report having had a stress test done years ago but cannot recall what this was done for.   She has been seeing her PCP regularly for routine care. She notes chronic knee  pain, this has been flaring up lately. Her blood pressure is under much better control lately. Her PCP currently has her taking Inderal LA 80 daily. Her lisinopril was discontinued at her last appointment on 09/26/2013.   She notes having had a very similar episode of palpitations 6 months prior one evening when she was trying to go to sleep. She did not want to wake her daughter. She took some vinegar, however this did not help. Upon waking her palpitations were gone.   She presented to [facility] overnight with sudden onset of palpitations (starting at 7 pm) very similar to the palpitations she had 6 months prior. She denies any chest pain, SOB, nausea, vomiting, diaphoresis, presyncope, or syncope. She has not had any recent colds or illnesses. Her knee has been bothering her more lately. She could not take the palpitations this time and they concerned her so she called for her daughter to bring her to [facility] for evaluation. Her weight is up 10 pounds to 160 from 150 since July (slow gain). No orthopnea. Trace lower extremity edema of the left leg.   Upon her arrival to [facility] she was found to be in new onset a-fib with RVR with HR in the 130s. She received IV diltiazem 5 mg x 2 and po Lopressor 50 mg x 1 in the ED with conversion to NSR. She has remained in NSR since. She was placed  on Lopressor 50 mg q 12 hours. K+ 3.7, Mg 2.2, troponin <0.02, hgb 12.6, tsh pending, EKG a-fib with RVR, 130 bpm, nonspecific st/t changes II, aVF, V5-V6. Echo has been ordered and is pending. She is currently resting comfortably in her room.   Physical Exam:  GEN well developed, no acute distress   HEENT hearing intact to voice   NECK supple  no JVD   RESP normal resp effort  clear BS   CARD Regular rate and rhythm  No murmur   ABD denies tenderness  soft   EXTR trace no pitting ede left lower extremity   SKIN normal to palpation   NEURO cranial nerves intact   PSYCH alert, A+O to time, place, person, good insight    Review of Systems:  Subjective/Chief Complaint SOB, palpitations   General: Fatigue   Skin: No Complaints   ENT: No Complaints   Eyes: No Complaints   Neck: No Complaints   Respiratory: No Complaints   Cardiovascular: Palpitations  Edema  trace LLE (left leg)   Gastrointestinal: No Complaints   Genitourinary: No Complaints   Vascular: No Complaints   Musculoskeletal: No Complaints   Neurologic: No Complaints   Hematologic: No Complaints   Endocrine: No Complaints   Psychiatric: No Complaints   Review of Systems: All other systems were reviewed and found to be negative   Family & Social History:  Family and Social History:  Family History Coronary Artery Disease  father: cad s/p mi (61); mother: htn   Social History negative tobacco, negative ETOH, negative Illicit drugs   Place of Living Home     asthma:    htn:    Cholecystectomy:          Admit Diagnosis:   AFIB: Onset Date: 28-Apr-2014, Status: Active, Description: AFIB  Home Medications: Medication Status  vit d Active  calcium Active  multi vitamin Active  tumeric Active   Lab Results:  Thyroid:  13-Feb-16 22:14   Thyroid Stimulating Hormone  7.79 (0.45-4.50 (IU = International Unit)  ----------------------- Pregnant patients have  different reference  ranges for TSH:  - - - - - - - - - -  Pregnant, first trimetser:  0.36 - 2.50 uIU/mL)  Hepatic:  12-Feb-16 22:14   Bilirubin, Total 0.6  Alkaline Phosphatase 94  SGPT (ALT) 22  SGOT (AST) 22  Total Protein, Serum 7.3  Albumin, Serum 3.5  Routine Chem:  12-Feb-16 22:14   Magnesium, Serum 2.2 (1.8-2.4 THERAPEUTIC RANGE: 4-7 mg/dL TOXIC: > 10 mg/dL  -----------------------)  Glucose, Serum  107  BUN  32  Creatinine (comp) 0.77  Sodium, Serum 145  Potassium, Serum 3.7  Chloride, Serum  108  CO2, Serum 30  Calcium (Total), Serum 9.3  Osmolality (calc) 296  eGFR (African American) >60  eGFR (Non-African American) >60  (eGFR values <7m/min/1.73 m2 may be an indication of chronic kidney disease (CKD). Calculated eGFR, using the MRDR Study equation, is useful in  patients with stable renal function. The eGFR calculation will not be reliable in acutely ill patients when serum creatinine is changing rapidly. It is not useful in patients on dialysis. The eGFR calculation may not be applicable to patients at the low and high extremes of body sizes, pregnant women, and vegetarians.)  Anion Gap 7  Cardiac:  12-Feb-16 22:14   Troponin I < 0.02 (0.00-0.05 0.05 ng/mL or less: NEGATIVE  Repeat testing in 3-6 hrs  if clinically indicated. >0.05 ng/mL: POTENTIAL  MYOCARDIAL INJURY. Repeat  testing in 3-6 hrs if  clinically indicated. NOTE: An increase or decrease  of 30% or more on serial  testing suggests a  clinically important change)  Routine UA:  12-Feb-16 22:14   Color (UA) Colorless  Clarity (UA) Clear  Glucose (UA) Negative  Bilirubin (UA) Negative  Ketones (UA) Negative  Specific Gravity (UA) 1.008  Blood (UA) Negative  pH (UA) 7.0  Protein (UA) 30 mg/dL  Nitrite (UA) Negative  Leukocyte Esterase (UA) Trace (Result(s) reported on 27 Apr 2014 at 11:09PM.)  RBC (UA) 2 /HPF  WBC (UA) 4 /HPF  Bacteria (UA) NONE SEEN  Epithelial Cells (UA) <1 /HPF (Result(s) reported on 27 Apr 2014 at 11:09PM.)  Routine Hem:  12-Feb-16 22:14   WBC (CBC) 5.5  RBC (CBC) 4.31  Hemoglobin (CBC) 12.6  Hematocrit (CBC) 38.8  Platelet Count (CBC) 213 (Result(s) reported on 27 Apr 2014 at 10:33PM.)  MCV 90  MCH 29.1  MCHC 32.4  RDW 13.4   EKG:  EKG Interp. by me   Interpretation EKG shows a-fib with RVR, 130 bpm, nonspecific st/t changes II, aVF, V5-V6   Radiology Results:  Cardiology:    13-Feb-16 10:05, Echo Doppler  Echo Doppler   REASON FOR EXAM:      COMMENTS:       PROCEDURE: [facility] - ECHO DOPPLER COMPLETE(TRANSTHOR)  - Apr 28 2014 10:05AM     RESULT: Echocardiogram Report    Patient Name:    Gina Frazier Date of Exam: 04/28/2014  Medical Rec #:  [id number]         Custom1:  Date of Birth:  [birth year]      Height:       62.0 in  Patient Age:    63 years       Weight:       158.0 lb  Patient Gender: F              BSA:          1.73 m??    Indications: Atrial Fib  Sonographer:    Arville Go RDCS  Referring Phys: DIAMOND, MICHAEL, S    Sonographer Comments: Suboptimal subcostal window.    Summary:   1. Left ventricular ejection fraction, by visual estimation, is 60 to   65%.   2. Normal global left ventricular systolic function.   3. Normal right ventricular size and systolic function.   4. Mild to moderate tricuspid regurgitation.   5. Normal RVSP  2D AND M-MODE MEASUREMENTS (normal ranges within parentheses):  Left Ventricle:          Normal  IVSd (2D):      1.06 cm (0.7-1.1)  LVPWd (2D):     1.02 cm (0.7-1.1) Aorta/LA:                  Normal  LVIDd (2D):     4.48 cm (3.4-5.7) Aortic Root (2D): 2.80 cm (2.4-3.7)  LVIDs (2D):     2.98 cm           Left Atrium (2D): 3.40 cm (1.9-4.0)  LV FS (2D):     33.5 %   (>25%)  LV EF (2D):     62.4 %   (>50%)                      Right Ventricle:  RVd (2D):  LV DIASTOLIC FUNCTION:  MV Peak E: 0.97 m/s E/e' Ratio: 9.00  MV Peak A: 0.87 m/s Decel Time: 343 msec  E/A Ratio: 1.11  SPECTRAL DOPPLER ANALYSIS (where applicable):  Mitral Valve:  MV P1/2 Time: 99.47 msec  MV Area, PHT: 2.21 cm??  Aortic Valve: AoV Max Vel: 1.26 m/s AoV Peak PG: 6.4 mmHg AoV Mean PG:  LVOT Vmax: 0.98 m/s LVOT VTI:  LVOT Diameter: 2.10 cm  AoV Area, Vmax: 2.69 cm?? AoV Area, VTI:  AoV Area, Vmn:  Tricuspid Valve and PA/RV Systolic Pressure: TR Max Velocity: 2.25 m/s RA   Pressure: 5 mmHg RVSP/PASP: 25.2 mmHg  Pulmonic Valve:  PV Max Velocity: 0.95 m/s PV Max PG: 3.6 mmHg PV Mean PG:    PHYSICIAN INTERPRETATION:  Left Ventricle: The left ventricular internal cavity size was normal. LV   posterior wall thickness  was normal. No left ventricular hypertrophy.   Global LV systolic function was normal. Left ventricular ejection   fraction, by visual estimation, is 60 to 65%. Spectral Doppler shows  normal pattern of LV diastolic filling.  Right Ventricle: Normal right ventricular size, wall thickness, and   systolic function. The right ventricular size is normal. Global RV     systolic function is normal.  Left Atrium: The left atrium is normal in size.  Right Atrium: The right atrium is normal in size.  Pericardium: There is no evidence of pericardial effusion.  Mitral Valve: The mitral valve is normal in structure. Trace mitral valve   regurgitation is seen.  Tricuspid Valve: The tricuspid valve is normal. Mild to moderate   tricuspid regurgitation is visualized. The tricuspid regurgitant velocity   is 2.25 m/s, and with an assumed right atrial pressure of 5 mmHg, the   estimated right ventricular systolic pressure is normal at 25.14mHg.  Aortic Valve: The aortic valve is normal. The aortic valve is   structurally normal, with no evidence of sclerosis or stenosis. No   evidence of aortic valve regurgitation is seen.  Pulmonic Valve: The pulmonic valve is normal. Trace pulmonic valve   regurgitation.  Aorta: The aortic root and ascending aorta are structurally normal, with   no evidence of dilitation.    [id number] RogueMD  Electronically signed by [id number] RogueMD  Signature Date/Time: 04/28/2014/11:28:10 AM    *** Final ***    IMPRESSION: .        Verified By: TMinna Merritts M.D., MD    Fish: Hives, Swelling  Vital Signs/Nurse's Notes: **Vital Signs.:   13-Feb-16 05:00  Vital Signs Type Routine  Temperature Temperature (F) 97.7  Celsius 36.5  Temperature Source oral  Pulse Pulse 62  Respirations Respirations 19  Systolic BP Systolic BP 1324 Diastolic BP (mmHg) Diastolic BP (mmHg) 60  Mean BP 75  Pulse Ox % Pulse Ox % 96  Pulse Ox Activity Level  At rest   Oxygen Delivery Room Air/ 21 %    Impression 69year old female with history of HTN and palpitations [id number] and was found to be in new onset a-fib with RVR with heart rates into the 130s. Currently back into NSR after receiving IV diltiazem 5 mg x 2 and po Lopressor 50 mg x 1 in the ED.   1. New onset a-fib: started last night at 7pm, no stressors causing arrhythmia Possibly 2nd episode of a-fib with RVR in 6 months, in terms of her symptomatic  episodes -Currently back in NSR with HR in the 60 echo normal with mild to moderate TR -CHADSVASc at least 3 (HTN, age, and female) giving her an annual risk of stroke at 3.2%  OK to D/c today on:  Eliquis 5 mg bid (no history of falls, no BRBPR, no melena, no hematemesis)(needs script. She has coupon) Metoprolol succinate 50 mg daily Will start flecainide 50 mg po BID (needs script)  I will arrange outpt follow up in clinic  TSH mildly elevated. Outpt workup, repeat with PMD  2. HTN: -Controlled -Inderal LA (on at home), change to metoprolol succinate 50 daily  3. Osteoarthritis: -Severe knee pain -Follow up as outpatient   Electronic Signatures: Rise Mu (PA-C)  (Signed 13-Feb-16 10:33)  Authored: General Aspect/Present Illness, History and Physical Exam, Review of System, Family & Social History, Past Medical History, Home Medications, Labs, EKG , Allergies, Vital Signs/Nurse's Notes, Impression/Plan [name] (MD)  (Signed 13-Feb-16 11:39)  Authored: General Aspect/Present Illness, History and Physical Exam, Review of System, Family & Social History, Health Issues, Home Medications, Labs, EKG , Radiology, Vital Signs/Nurse's Notes, Impression/Plan  Co-Signer: General Aspect/Present Illness, History and Physical Exam, Review of System, Family & Social History, Past Medical History, Home Medications, Labs, EKG , Allergies, Vital Signs/Nurse's Notes,  Impression/Plan   Last Updated: 13-Feb-16 11:39 by [name] (MD)

## 2014-07-15 NOTE — Discharge Summary (Signed)
PATIENT NAME:  Gina Frazier, Gina Frazier MR#:  [id number] DATE OF BIRTH:  10-30-1945  DATE OF ADMISSION:  04/28/2014 DATE OF DISCHARGE:  04/29/2014  PRESENTING COMPLAINT: New-onset atrial fibrillation with RVR.   DISCHARGE DIAGNOSES:  1.  Atrial fibrillation with rapid ventricular response, resolved.  2.  Hypertension.  3.  Osteoarthritis.  CODE STATUS: Full code.   MEDICATIONS:  1.  Vitamin D p.o. daily.  2.  Calcium and multivitamin as before.  3.  Continue turmeric capsules as before.  4.  Flecainide 50 mg b.i.d.  5.  Eliquis 5 mg b.i.d.  6.  Metoprolol 50 mg b.i.d.   DIET: Low-sodium diet.  FOLLOWUP: Follow up with Dr. Rockey Situ in 1 week.  CONSULTATION: Cardiology consultation by Dr. Rockey Situ.  STUDIES: Echo Doppler: LV function ejection fraction of 60%  to 65%. Normal global left ventricular systolic function. Normal right ventricular and systolic function. Mild to moderate tricuspid regurgitation. Normal RVSP. CBC within normal limits. Magnesium 2.2. UA negative for UTI.  BRIEF SUMMARY OF HOSPITAL COURSE: [name] is a pleasant 69 year old African American female with a history of hypertension, comes in with palpitations, not feeling well. Was found to be:  1.  In atrial fibrillation with rapid ventricular response: Cardizem bolus was given in the ER. She received metoprolol. Rate was controlled and the patient was started on flecainide and Eliquis by cardiology. She started the medications well. She will follow up with Dr. Rockey Situ as outpatient. 2.  Hypertension: On metoprolol.  3.  Osteoarthritis: Continue p.r.n. Tylenol.   Hospital stay otherwise remained stable. The patient remained a full code.   TIME SPENT: 40 minutes.  _____ Hart Rochester. Posey Pronto, MD sap:ST D: 05/04/2014 00:53:40 ET T: 05/04/2014 01:39:20 ET JOB#: [id number]  cc: Javae Braaten A. Posey Pronto, MD, <Dictator> Ilda Basset MD ELECTRONICALLY SIGNED 05/15/2014 17:31

## 2014-07-19 ENCOUNTER — Encounter: Payer: Self-pay | Admitting: Physical Therapy

## 2014-07-19 ENCOUNTER — Ambulatory Visit: Payer: BLUE CROSS/BLUE SHIELD | Attending: Orthopedic Surgery | Admitting: Physical Therapy

## 2014-07-19 DIAGNOSIS — M5432 Sciatica, left side: Secondary | ICD-10-CM | POA: Insufficient documentation

## 2014-07-19 DIAGNOSIS — Z9889 Other specified postprocedural states: Secondary | ICD-10-CM | POA: Diagnosis not present

## 2014-07-19 DIAGNOSIS — Z4789 Encounter for other orthopedic aftercare: Secondary | ICD-10-CM | POA: Insufficient documentation

## 2014-07-19 DIAGNOSIS — M6281 Muscle weakness (generalized): Secondary | ICD-10-CM

## 2014-07-19 DIAGNOSIS — R262 Difficulty in walking, not elsewhere classified: Secondary | ICD-10-CM

## 2014-07-19 NOTE — Patient Instructions (Signed)
ABDUCTION: Side-Lying (Active)   Lie on right side, top leg straight. Raise top leg as far as possible. Don't use ankle weights. Complete __2_ sets of _10__ repetitions. Perform _2__ sessions per day.  http://gtsc.exer.us/95   Copyright  VHI. All rights reserved.  Quad Set   With other leg bent, foot flat, slowly tighten muscles on thigh of straight leg while counting out loud to __5__. Repeat __10__ times. Do __2__ sessions per day.  http://gt2.exer.us/276   Copyright  VHI. All rights reserved.  [employer] a large can or rolled towel under leg. Straighten knee and leg. Hold __5__ seconds. Repeat with other leg. Repeat __2x10__ times. Do __2__ sessions per day.  http://gt2.exer.us/366   Copyright  VHI. All rights reserved.  HIP: Flexion / KNEE: Extension, Straight Leg Raise   Raise leg, keeping knee straight. Perform slowly. __2 sets of 10 reps, __2_ sets per day.   Copyright  VHI. All rights reserved.

## 2014-07-19 NOTE — Therapy (Signed)
[facility] [address] [place], [address] Phone: [phone]   Fax:  [phone]  Physical Therapy Evaluation  Patient Details  Name: Gina Frazier MRN: [id number] Date of Birth: 03-20-1945 Referring Provider:  Cecilie Kicks, PA-C  Encounter Date: 07/19/2014      PT End of Session - 07/19/14 1410    Visit Number 1   Number of Visits 8   Date for PT Re-Evaluation 08/16/14   Authorization Type PT Certification   PT Start Time 1257   PT Stop Time 1342   PT Time Calculation (min) 45 min   Activity Tolerance Patient tolerated treatment well   Behavior During Therapy [facility] for tasks assessed/performed      Past Medical History  Diagnosis Date  . Hypertension   . Palpitations   . Osteoarthritis   . Chronic back pain   . Hip joint pain     left  . Anxiety   . Family history of adverse reaction to anesthesia     pts daughter had severe vomiting 2014   . Dysrhythmia     A Fib     Past Surgical History  Procedure Laterality Date  . Cholecystectomy    . Tooth extraction    . Knee arthroscopy Left 06/28/2014    Procedure: ARTHROSCOPY KNEE WITH PARTIALLATERAL MENISECTOMY AND DEBRIDEMENT;  Surgeon: Susa Day, MD;  Location: [facility];  Service: Orthopedics;  Laterality: Left;    There were no vitals filed for this visit.  Visit Diagnosis:  S/P left knee arthroscopy  Difficulty walking  Muscle weakness      Subjective Assessment - 07/19/14 1338    Subjective Pt is a 69 y.o. female who had a L knee lateral menisectomy and debridement on 06/28/14 by Dr. Tonita Cong.  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  She reports a history of low back pain since her 30's. She states that her L knee pain and weakness causes her difficulty with vaccuming, gardening, driving, and ambulating longer community distances.  She also reports  difficulty descending stairs, and states that she feels like she might fall when going downstairs.  She denies any falls at this time.   Patient is accompained by: Family member  Accompanied by Daughter and Granddaughter   Pertinent History Long history of LBP and L knee pain   Limitations Sitting;Standing;Walking;House hold activities   How long can you sit comfortably? 15 minutes.  Pt's daughter reports she can only sit for about 15 minutes and then "cries out in pain because her L knee hurts".   How long can you stand comfortably? 30 minutes   How long can you walk comfortably? 5-10 minutes   Patient Stated Goals Be able to vacuum, garden, drive, and go downstairs with less pain and weakness.   Currently in Pain? No/denies   Pain Score 0-No pain   Pain Location Knee  currently 0/10 pain, but pt states it can be as high as a 9/10 at worst on 0-10 pain scale   Pain Orientation Left   Pain Descriptors / Indicators Aching;Stabbing   Pain Radiating Towards pain is throughout all aspects of L knee, but at times radiates into L lateral thigh   Pain Onset More than a month ago   Pain Frequency Intermittent   Aggravating Factors  prolonged sitting; prolonged standing; prolonged ambulation   Pain Relieving  Factors rest   Multiple Pain Sites Yes   Pain Location Back            [facility] PT Assessment - 07/19/14 0001    Assessment   Medical Diagnosis s/p L knee arthroscopy with lateral menisectomy and debridement   Onset Date 06/28/14   Next MD Visit 08/09/14   Balance Screen   Has the patient fallen in the past 6 months No   Has the patient had a decrease in activity level because of a fear of falling?  Yes   Is the patient reluctant to leave their home because of a fear of falling?  No   Home Environment   Living Enviornment Private residence   Home Access Stairs to enter  16 stairs with rails to enter/exit home   Additional Comments 16 stairs inside the home with rails   Prior Function    Leisure gardening   Cognition   Overall Cognitive Status Within Functional Limits for tasks assessed   Observation/Other Assessments   Observations Well healing incision sites at med and lat L knee   Lower Extremity Functional Scale  15 out of 80  indicates severe disability    AROM   Overall AROM  Deficits   AROM Assessment Site Knee   Right/Left Knee Left   Left Knee Extension -15   Left Knee Flexion 92   PROM   Overall PROM  Deficits   PROM Assessment Site Knee   Right/Left Knee Left   Left Knee Extension -10   Left Knee Flexion 97   Strength   Overall Strength Deficits   Overall Strength Comments 2+/5 throughout L knee; 4/5 grossly throughout L hip and ankle   Flexibility   Soft Tissue Assessment /Muscle Length yes   Hamstrings moderately decreased length   Quadriceps moderately decreased length   ITB moderately decreased length   Palpation   Palpation Mod-severe tenderness to palp at lateral aspect of L patella, superior aspect of L knee, and at distal ITB   Special Tests    Special Tests Lumbar   Straight Leg Raise   Findings --  SLR on L LE negative for increased pain   Comment tested due to hx of lumbar problems   Ambulation/Gait   Gait Comments Pt ambulates with moderately antalgic gait pattern and decreased stance time on the left.   Balance   Balance Assessed Yes   Dynamic Standing Balance   Dynamic Standing - Comments Pt has fair dynamic standing balance and fair balance with ambulation.   Functional Gait  Assessment   Gait assessed  Yes   Gait Level Surface Walks 20 ft, slow speed, abnormal gait pattern, evidence for imbalance or deviates 10-15 in outside of the 12 in walkway width. Requires more than 7 sec to ambulate 20 ft.                           PT Education - 07/19/14 1409    Education provided Yes   Education Details new HEP   Person(s) Educated Patient   Methods Explanation;Demonstration;Handout   Comprehension Verbalized  understanding    HEP: SLR, SL'ing hip abduction, Quad sets, short arc quads- working up to 2 sets of 10 reps, twice a day.         PT Long Term Goals - 07/19/14 1419    PT LONG TERM GOAL #1   Title Pt will have full active and passive ROM, and improved  muscle strength by at least 1 muscle grade at the L knee, allowing her to descend stairs with less difficulty.   Time 4   Period Weeks   Status New   PT LONG TERM GOAL #2   Title Pt will have an improved Lower Extremity Functional Scale score to at least 40/80 or better indicating much less disability due to L LE pain and weakness.    Baseline 15 out of 80 currently   Time 4   Period Weeks   Status New   PT LONG TERM GOAL #3   Title Pt will be edcuated in and independent with a home exercise program to improve L LE strength, ROM, and flexibility.   Time 4   Period Weeks   Status New   PT LONG TERM GOAL #4   Title Pt will report being able to ambulate and stand for longer periods, and walk community distances with less pain and difficulty.   Time 4   Period Weeks   Status New               Plan - 07/19/14 1413    Clinical Impression Statement Gina Frazier has decreased strength, ROM, and flexibility grossly throughout the L LE, contributing to her L knee pain and instability.  She should respond well to skilled PT intervention to promote L LE strength, ROM, and flexibilty.  She should also respond well to gait training to be able to descend stairs and ambulate longer distances.   Pt will benefit from skilled therapeutic intervention in order to improve on the following deficits Abnormal gait;Decreased activity tolerance;Decreased balance;Impaired flexibility;Difficulty walking;Decreased range of motion;Pain;Decreased strength   Rehab Potential Good   Clinical Impairments Affecting Rehab Potential long history or lumbar problems   PT Frequency 2x / week   PT Duration 4 weeks   PT Treatment/Interventions Therapeutic  exercise;Moist Heat;Cryotherapy;Electrical Stimulation;Stair training;Gait training;Balance training;Manual techniques;Passive range of motion;Patient/family education   PT Next Visit Plan review home exercises; PROM to L knee; advance LE strengthening as tolerated; modalities PRN for pain control   PT Home Exercise Plan See Pt instructions section   Consulted and Agree with Plan of Care Patient         Problem List Patient Active Problem List   Diagnosis Date Noted  . Atrial fibrillation 05/01/2014  . Snoring 05/01/2014  . Anxiety attack 12/30/2010  . Visit for preventive health examination 12/30/2010  . Hypertension 07/21/2010  . Chronic back pain 07/21/2010  . Vitamin D deficiency 07/21/2010  . SHORTNESS OF BREATH 05/01/2010  . UNSPECIFIED ESSENTIAL HYPERTENSION 04/18/2010  . OSTEOARTHROS UNSPEC GEN/LOC OTH SPEC SITES 04/18/2010  . KNEE PAIN, LEFT 04/18/2010    Jovanni Rash, MPT 07/19/2014, 2:36 PM  [facility] [address] [place], [address] Phone: [phone]   Fax:  [phone]

## 2014-07-23 ENCOUNTER — Ambulatory Visit: Payer: BLUE CROSS/BLUE SHIELD | Admitting: Physical Therapy

## 2014-07-23 DIAGNOSIS — R262 Difficulty in walking, not elsewhere classified: Secondary | ICD-10-CM

## 2014-07-23 DIAGNOSIS — M5432 Sciatica, left side: Secondary | ICD-10-CM | POA: Diagnosis not present

## 2014-07-23 DIAGNOSIS — Z9889 Other specified postprocedural states: Secondary | ICD-10-CM

## 2014-07-24 NOTE — Addendum Note (Signed)
Addended by: Bettye Boeck C on: 07/24/2014 11:01 AM   Modules accepted: Orders

## 2014-07-24 NOTE — Therapy (Signed)
[facility] [address] [place], [address] Phone: [phone]   Fax:  [phone]  Physical Therapy Treatment  Patient Details  Name: Gina Frazier MRN: [id number] Date of Birth: 07/16/1945 Referring Provider:  Arnetha Courser, MD  Encounter Date: 07/23/2014      PT End of Session - 07/23/14 1448    Visit Number 2   Number of Visits 8   Date for PT Re-Evaluation 08/16/14   Authorization Type PT certification   PT Start Time 1400   PT Stop Time 1455   PT Time Calculation (min) 55 min   Activity Tolerance Patient limited by pain   Behavior During Therapy Anxious      Past Medical History  Diagnosis Date  . Hypertension   . Palpitations   . Osteoarthritis   . Chronic back pain   . Hip joint pain     left  . Anxiety   . Family history of adverse reaction to anesthesia     pts daughter had severe vomiting 2014   . Dysrhythmia     A Fib     Past Surgical History  Procedure Laterality Date  . Cholecystectomy    . Tooth extraction    . Knee arthroscopy Left 06/28/2014    Procedure: ARTHROSCOPY KNEE WITH PARTIALLATERAL MENISECTOMY AND DEBRIDEMENT;  Surgeon: Susa Day, MD;  Location: [facility];  Service: Orthopedics;  Laterality: Left;    There were no vitals filed for this visit.  Visit Diagnosis:  S/P lateral meniscal repair  Difficulty walking      Subjective Assessment - 07/23/14 1438    Subjective Pt reports she has had a lot of K knee soreness over the last few days.  She states she has been compliant with her HEP and has no problems or concerns with the HEP.   Patient is accompained by: Family member   Limitations Sitting;Walking   Currently in Pain? Yes   Pain Score 8    Pain Location Knee   Pain Orientation Left   Pain Descriptors / Indicators Sharp;Stabbing   Pain Onset More than a month ago   Pain Frequency Constant   Multiple Pain Sites No                                  PT Education - 07/23/14 1445    Education provided Yes   Education Details Educated pt in using heating pad prior to HEP, and end with CP to knee after ex's.  Educated pt in how to perform trigger point release and STM with tennis ball to quads at home.   Person(s) Educated Patient;Child(ren)   Methods Explanation;Demonstration   Comprehension Verbalized understanding             PT Long Term Goals - 07/19/14 1419    PT LONG TERM GOAL #1   Title Pt will have full active and passive ROM, and improved muscle strength by at least 1 muscle grade at the L knee, allowing her to descend stairs with less difficulty.   Time 4   Period Weeks   Status New   PT LONG TERM GOAL #2   Title Pt will have an improved Lower Extremity Functional Scale score to at least 40/80 or better indicating much less disability due to L LE pain and weakness.    Baseline 15 out of 80 currently   Time  4   Period Weeks   Status New   PT LONG TERM GOAL #3   Title Pt will be edcuated in and independent with a home exercise program to improve L LE strength, ROM, and flexibility.   Time 4   Period Weeks   Status New   PT LONG TERM GOAL #4   Title Pt will report being able to ambulate and stand for longer periods, and walk community distances with less pain and difficulty.   Time 4   Period Weeks   Status New               Plan - 07/23/14 1449    Clinical Impression Statement Pt is somewhat limited by anxiety and muscle guarding.  High level of pain today (8/10).  Slow progression of LE ther ex's due to high pain level.   Pt will benefit from skilled therapeutic intervention in order to improve on the following deficits Abnormal gait;Difficulty walking;Impaired flexibility;Increased muscle spasms;Decreased strength;Pain;Decreased range of motion   Rehab Potential Good   PT Frequency 2x / week   PT Duration 4 weeks   PT Next Visit Plan continue to  work on L knee ROM into flexion and extension and progress strengthening ex's as tolerated        Problem List Patient Active Problem List   Diagnosis Date Noted  . Atrial fibrillation 05/01/2014  . Snoring 05/01/2014  . Anxiety attack 12/30/2010  . Visit for preventive health examination 12/30/2010  . Hypertension 07/21/2010  . Chronic back pain 07/21/2010  . Vitamin D deficiency 07/21/2010  . SHORTNESS OF BREATH 05/01/2010  . UNSPECIFIED ESSENTIAL HYPERTENSION 04/18/2010  . OSTEOARTHROS UNSPEC GEN/LOC OTH SPEC SITES 04/18/2010  . KNEE PAIN, LEFT 04/18/2010    Veora Fonte, MPT 07/24/2014, 1:28 PM  [facility] [address], [place], [address] Phone: [phone]   Fax:  [phone]

## 2014-07-25 ENCOUNTER — Ambulatory Visit: Payer: BLUE CROSS/BLUE SHIELD | Admitting: Physical Therapy

## 2014-07-25 DIAGNOSIS — R262 Difficulty in walking, not elsewhere classified: Secondary | ICD-10-CM

## 2014-07-25 DIAGNOSIS — Z9889 Other specified postprocedural states: Secondary | ICD-10-CM

## 2014-07-25 DIAGNOSIS — M5432 Sciatica, left side: Secondary | ICD-10-CM | POA: Diagnosis not present

## 2014-07-25 NOTE — Therapy (Signed)
[facility] [address] [place], [address] Phone: [phone]   Fax:  [phone]  Physical Therapy Treatment  Patient Details  Name: Gina Frazier MRN: [id number] Date of Birth: 05-May-1945 Referring Provider:  Arnetha Courser, MD  Encounter Date: 07/25/2014      PT End of Session - 07/25/14 1438    Visit Number 3   Number of Visits 8   Date for PT Re-Evaluation 08/16/14   Authorization Type PT certification   PT Start Time 1347   PT Stop Time 1446   PT Time Calculation (min) 59 min   Activity Tolerance Patient limited by pain   Behavior During Therapy Anxious  Pt in high level of pain w/ all manual therapy and ther ex's      Past Medical History  Diagnosis Date  . Hypertension   . Palpitations   . Osteoarthritis   . Chronic back pain   . Hip joint pain     left  . Anxiety   . Family history of adverse reaction to anesthesia     pts daughter had severe vomiting 2014   . Dysrhythmia     A Fib     Past Surgical History  Procedure Laterality Date  . Cholecystectomy    . Tooth extraction    . Knee arthroscopy Left 06/28/2014    Procedure: ARTHROSCOPY KNEE WITH PARTIALLATERAL MENISECTOMY AND DEBRIDEMENT;  Surgeon: Gina Day, MD;  Location: [facility];  Service: Orthopedics;  Laterality: Left;    There were no vitals filed for this visit.  Visit Diagnosis:  Status post lateral meniscal repair  Difficulty walking      Subjective Assessment - 07/25/14 1427    Subjective Pt continues to have severely antalgic gait pattern and very high levels of pain at rest and with all activities.  She states she has been icing at home, but is still having a lot of pain and swelling.  "It feels like something isn't right."   Patient is accompained by: Family member  daughter and granddaughter   Limitations Sitting;Standing;Walking   Currently in Pain? Yes   Pain Score 8    Pain Location Knee   Pain  Orientation Left   Pain Descriptors / Indicators Sharp;Stabbing;Shooting  shooting into distal LE   Pain Type Acute pain;Intractable pain   Pain Onset More than a month ago   Pain Frequency Constant   Aggravating Factors  Pt states that her pain is 10/10 upon rising in the morning and upon standing after prolonged sitting; "I have to stand there and wait for it to adjust and unlock. It's very painful."   Pain Location Back                         [facility] Adult PT Treatment/Exercise - 07/25/14 0001    Ambulation/Gait   Gait Comments Pt ambulated into clinic with severly antalgic gait pattern today with no A device.  Pt has bene advised to try straight cane to decrease strain at L knee.   Knee/Hip Exercises: Supine   Quad Sets Left;2 sets;10 reps   Short Arc Quad Sets Left;2 sets;10 reps   Heel Slides 2 sets;10 reps;AAROM   Straight Leg Raises AAROM;2 sets;10 reps   Patellar Mobs in all directions by PT; instructed in how to do this at home   Modalities   Modalities Electrical Stimulation  IFC with CP at L knee x 17  min   Manual Therapy   Manual Therapy Passive ROM   Massage Petrissage and trigger point release to L quads; palpable trigger points in these muscles   Passive ROM PROM into L knee flex and extension in supine and stretches to L hip and knee in sidelying                     PT Long Term Goals - 07/19/14 1419    PT LONG TERM GOAL #1   Title Pt will have full active and passive ROM, and improved muscle strength by at least 1 muscle grade at the L knee, allowing her to descend stairs with less difficulty.   Time 4   Period Weeks   Status New   PT LONG TERM GOAL #2   Title Pt will have an improved Lower Extremity Functional Scale score to at least 40/80 or better indicating much less disability due to L LE pain and weakness.    Baseline 15 out of 80 currently   Time 4   Period Weeks   Status New   PT LONG TERM GOAL #3   Title Pt will be  edcuated in and independent with a home exercise program to improve L LE strength, ROM, and flexibility.   Time 4   Period Weeks   Status New   PT LONG TERM GOAL #4   Title Pt will report being able to ambulate and stand for longer periods, and walk community distances with less pain and difficulty.   Time 4   Period Weeks   Status New               Plan - 07/25/14 1440    Clinical Impression Statement [name] continues to have a very high level of pain varying from 8/10 to 10/10 on 0-10 pain scale.  She is ambulating with an antalgic gait pattern with no A device.  She has been advised to buy a straight cane to help decrease strain at the L knee with standing and ambulation.  She reports that prolonged sitting and prolonged standing make her pain level increase.  She has considerable swelling at the L knee. In standing and with ambulation pt has severely increased valgus at L knee vs. R knee.  Pt has been advised  to be very consistent with HEP ,gait with cane, and icing between now and next visit.  If no improvement, will re-assess at next visit and call MD if no improvement.   Pt will benefit from skilled therapeutic intervention in order to improve on the following deficits Abnormal gait;Pain;Increased muscle spasms;Impaired flexibility;Decreased strength;Decreased range of motion;Difficulty walking   Rehab Potential Fair   PT Treatment/Interventions Cryotherapy;Software engineer;Therapeutic exercise;Therapeutic activities;Patient/family education;Manual techniques;Passive range of motion   PT Next Visit Plan re-assess status and contact MD if no improvement   Consulted and Agree with Plan of Care Patient;Family member/caregiver        Problem List Patient Active Problem List   Diagnosis Date Noted  . Atrial fibrillation 05/01/2014  . Snoring 05/01/2014  . Anxiety attack 12/30/2010  . Visit for preventive health examination 12/30/2010  .  Hypertension 07/21/2010  . Chronic back pain 07/21/2010  . Vitamin D deficiency 07/21/2010  . SHORTNESS OF BREATH 05/01/2010  . UNSPECIFIED ESSENTIAL HYPERTENSION 04/18/2010  . OSTEOARTHROS UNSPEC GEN/LOC OTH SPEC SITES 04/18/2010  . KNEE PAIN, LEFT 04/18/2010    Gina Frazier, MPT 07/25/2014, 2:51 PM  [facility] [address], [place], [address] Phone: [phone]   Fax:  [phone]

## 2014-07-30 ENCOUNTER — Ambulatory Visit: Payer: BLUE CROSS/BLUE SHIELD | Admitting: Physical Therapy

## 2014-07-30 DIAGNOSIS — R262 Difficulty in walking, not elsewhere classified: Secondary | ICD-10-CM

## 2014-07-30 DIAGNOSIS — M6281 Muscle weakness (generalized): Secondary | ICD-10-CM

## 2014-07-30 DIAGNOSIS — M5432 Sciatica, left side: Secondary | ICD-10-CM | POA: Diagnosis not present

## 2014-07-30 DIAGNOSIS — Z9889 Other specified postprocedural states: Secondary | ICD-10-CM

## 2014-07-30 NOTE — Therapy (Signed)
[place] [facility] [address], [place], [address] Phone: [phone]   Fax:  [phone]  Physical Therapy Treatment  Patient Details  Name: Gina Frazier MRN: [id number] Date of Birth: [birth year] Referring Provider:  Arnetha Courser, MD  Encounter Date: 07/30/2014      PT End of Session - 07/30/14 1616    Visit Number 4   Number of Visits 8   Date for PT Re-Evaluation 08/16/14   PT Start Time 4174   PT Stop Time 1503   PT Time Calculation (min) 46 min   Activity Tolerance Patient tolerated treatment well;No increased pain   Behavior During Therapy [facility] for tasks assessed/performed      Past Medical History  Diagnosis Date  . Hypertension   . Palpitations   . Osteoarthritis   . Chronic back pain   . Hip joint pain     left  . Anxiety   . Family history of adverse reaction to anesthesia     pts daughter had severe vomiting 2014   . Dysrhythmia     A Fib     Past Surgical History  Procedure Laterality Date  . Cholecystectomy    . Tooth extraction    . Knee arthroscopy Left 06/28/2014    Procedure: ARTHROSCOPY KNEE WITH PARTIALLATERAL MENISECTOMY AND DEBRIDEMENT;  Surgeon: Susa Day, MD;  Location: [facility];  Service: Orthopedics;  Laterality: Left;    There were no vitals filed for this visit.  Visit Diagnosis:  S/P lateral meniscal repair  Difficulty walking  Muscle weakness      Subjective Assessment - 07/30/14 1608    Subjective Ms. Uhls has made very good improvements in S/S since her last PT visit.  She reports she is having much less knee pain today and has been very complaint with her HEP.  She has been using an A device for ambulating around the house as advised (she states she has been using one axillary crutch instead of a straight cane as advised.)  She has been icing a lot as well.  She is in much better spirits than at previous visits due to having less pain and difficulty  ambulating today.   Currently in Pain? No/denies   Pain Score 0-No pain                         [facility] Adult PT Treatment/Exercise - 07/30/14 0001    Ambulation/Gait   Gait Comments Pt ambulated in clinic with much less antalgic gait pattern today.  She did not arrive with A device.  She demonstrated less valgus at L knee with ambulation today than at previous visits.  She still has a mildly antalgic gait pattern and decreased cadence, as well as a lack of full L knee extension with ambulation.   Knee/Hip Exercises: Aerobic   Stationary Bike Nustep L2 x 6.5 mins focusing on full L knee AROM flexion and extension   Knee/Hip Exercises: Supine   Quad Sets Left;2 sets;10 reps   Short Arc Quad Sets Left;2 sets;10 reps   Heel Slides AROM;2 sets;10 reps   Straight Leg Raises AROM;2 sets;10 reps   Patellar Mobs in all directions by PT; instructed in how to do this at home   Knee/Hip Exercises: Sidelying   Clams 2x10 on L   Other Sidelying Knee Exercises Hip / with manual A from PT 2x10   Modalities   Modalities Cryotherapy  at  end of session in sitting   Cryotherapy   Number Minutes Cryotherapy 10 Minutes   Cryotherapy Location Knee   Type of Cryotherapy Ice pack   Manual Therapy   Manual Therapy Passive ROM   Soft tissue mobilization Petrissage and trigger point release to L quads; palpable trigger points in these muscles   Passive ROM PROM into L knee flex and extension in supine and stretches to L hip and knee in sidelying                     PT Long Term Goals - 07/19/14 1419    PT LONG TERM GOAL #1   Title Pt will have full active and passive ROM, and improved muscle strength by at least 1 muscle grade at the L knee, allowing her to descend stairs with less difficulty.   Time 4   Period Weeks   Status New   PT LONG TERM GOAL #2   Title Pt will have an improved Lower Extremity Functional Scale score to at least 40/80 or better indicating much less  disability due to L LE pain and weakness.    Baseline 15 out of 80 currently   Time 4   Period Weeks   Status New   PT LONG TERM GOAL #3   Title Pt will be edcuated in and independent with a home exercise program to improve L LE strength, ROM, and flexibility.   Time 4   Period Weeks   Status New   PT LONG TERM GOAL #4   Title Pt will report being able to ambulate and stand for longer periods, and walk community distances with less pain and difficulty.   Time 4   Period Weeks   Status New               Plan - 07/30/14 1619    Clinical Impression Statement Pt made good progress since last week.  She still has some swelling at superior aspect of L knee and lacks full active and passive L knee extension; however, the swelling has decreased and she denies pain today.  She has been advised to continue using A device with gait for now to decrease strain at L knee.   PT Next Visit Plan work on Nustep again for AROM; continue with manual PROM; progress strengthening ex's as tolerated; gait trg on steps next visit        Problem List Patient Active Problem List   Diagnosis Date Noted  . Atrial fibrillation 05/01/2014  . Snoring 05/01/2014  . Anxiety attack 12/30/2010  . Visit for preventive health examination 12/30/2010  . Hypertension 07/21/2010  . Chronic back pain 07/21/2010  . Vitamin D deficiency 07/21/2010  . SHORTNESS OF BREATH 05/01/2010  . UNSPECIFIED ESSENTIAL HYPERTENSION 04/18/2010  . OSTEOARTHROS UNSPEC GEN/LOC OTH SPEC SITES 04/18/2010  . KNEE PAIN, LEFT 04/18/2010    Gina Frazier, MPT 07/30/2014, 4:25 PM  [place] [facility] [address] [place], [address] Phone: [phone]   Fax:  [phone]

## 2014-08-01 ENCOUNTER — Encounter: Payer: BLUE CROSS/BLUE SHIELD | Admitting: Physical Therapy

## 2014-08-06 ENCOUNTER — Ambulatory Visit: Payer: BLUE CROSS/BLUE SHIELD | Admitting: Physical Therapy

## 2014-08-06 DIAGNOSIS — M5432 Sciatica, left side: Secondary | ICD-10-CM | POA: Diagnosis not present

## 2014-08-06 DIAGNOSIS — M6281 Muscle weakness (generalized): Secondary | ICD-10-CM

## 2014-08-06 DIAGNOSIS — R262 Difficulty in walking, not elsewhere classified: Secondary | ICD-10-CM

## 2014-08-06 NOTE — Therapy (Signed)
[place] [facility] [address] [place], [address] Phone: [phone]   Fax:  [phone]  Physical Therapy Treatment  Patient Details  Name: Gina Frazier MRN: [id number] Date of Birth: Nov 23, 1945 Referring Provider:  Cecilie Kicks, PA-C  Encounter Date: 08/06/2014      PT End of Session - 08/06/14 1130    Visit Number 5   Number of Visits 8   Date for PT Re-Evaluation 08/16/14   PT Start Time 1022   PT Stop Time 1122   PT Time Calculation (min) 60 min   Activity Tolerance Patient tolerated treatment well  Pt tolerated addition of more advanced LE strengthening ex's without problems today; added to HEP   Behavior During Therapy [facility] for tasks assessed/performed      Past Medical History  Diagnosis Date  . Hypertension   . Palpitations   . Osteoarthritis   . Chronic back pain   . Hip joint pain     left  . Anxiety   . Family history of adverse reaction to anesthesia     pts daughter had severe vomiting 2014   . Dysrhythmia     A Fib     Past Surgical History  Procedure Laterality Date  . Cholecystectomy    . Tooth extraction    . Knee arthroscopy Left 06/28/2014    Procedure: ARTHROSCOPY KNEE WITH PARTIALLATERAL MENISECTOMY AND DEBRIDEMENT;  Surgeon: Susa Day, MD;  Location: [facility];  Service: Orthopedics;  Laterality: Left;    There were no vitals filed for this visit.  Visit Diagnosis:  Muscle weakness  Difficulty walking      Subjective Assessment - 08/06/14 1117    Subjective Gina Frazier arrives stating, "I feel great.  It's so much better."  She reports no knee pain currently.  She states she continues to do her HEP everyday and has stopped using the crutch at home with ambulation.   Currently in Pain? No/denies   Pain Score 0-No pain                         [facility] Adult PT Treatment/Exercise - 08/06/14 0001    Ambulation/Gait   Gait Comments Pt's gait  speed has improved and is less antalgic than last week.  She continues to stand and ambulate with excessive genu valgus, L>R.   Knee/Hip Exercises: Aerobic   Stationary Bike Nustep L2 x 8 mins focusing on full L knee AROM flexion and extension   Knee/Hip Exercises: Standing   Wall Squat 2 sets;10 reps   Other Standing Knee Exercises SLR hip 4-ways with 2# ankle wt 2x10 each   Knee/Hip Exercises: Seated   Long Arc Quad 2 sets;10 reps  2# ankle wt   Other Seated Knee Exercises HS curls with RTB 2x10   Knee/Hip Exercises: Supine   Short Arc Quad Sets Left;2 sets;10 reps  with 2# ankle wt   Straight Leg Raises 2 sets;10 reps;Strengthening   Patellar Mobs in all directions by PT; instructed in how to do this at home   Other Supine Knee Exercises B hip abd/ER with black TB around thighs in hooklying; 2x10   Knee/Hip Exercises: Sidelying   Clams 2x10 on L  with manual resistance   Cryotherapy   Number Minutes Cryotherapy 10 Minutes   Cryotherapy Location Knee   Type of Cryotherapy Ice pack   Manual Therapy   Manual Therapy Passive ROM   Passive  ROM PROM into L knee flex and extension in supine and stretches to L hip and knee in sidelying  overpressure into L knee extension at distal thigh                 PT Education - 08/06/14 1128    Education provided Yes   Education Details Added LAQ's, stdg HS curls, stdg hip SLR 4 ways, wall squats to HEP 1x/day; to continue with other initial home ex's 1x/day   Person(s) Educated Patient   Methods Explanation;Demonstration;Verbal cues   Comprehension Returned demonstration;Verbal cues required             PT Long Term Goals - 07/19/14 1419    PT LONG TERM GOAL #1   Title Pt will have full active and passive ROM, and improved muscle strength by at least 1 muscle grade at the L knee, allowing her to descend stairs with less difficulty.   Time 4   Period Weeks   Status New   PT LONG TERM GOAL #2   Title Pt will have an improved  Lower Extremity Functional Scale score to at least 40/80 or better indicating much less disability due to L LE pain and weakness.    Baseline 15 out of 80 currently   Time 4   Period Weeks   Status New   PT LONG TERM GOAL #3   Title Pt will be edcuated in and independent with a home exercise program to improve L LE strength, ROM, and flexibility.   Time 4   Period Weeks   Status New   PT LONG TERM GOAL #4   Title Pt will report being able to ambulate and stand for longer periods, and walk community distances with less pain and difficulty.   Time 4   Period Weeks   Status New               Plan - 08/06/14 1133    Clinical Impression Statement Gina Frazier continues to make progress with gait and decreased subjective complaints.  She still has very weak quad musculature and overall weakness in L LE.  She tolerated addition of more advanced LE strengthening ex's with no problems today.   PT Next Visit Plan continue to work on LE strengthening, and knee flexion/extension ROM at next visit        Problem List Patient Active Problem List   Diagnosis Date Noted  . Atrial fibrillation 05/01/2014  . Snoring 05/01/2014  . Anxiety attack 12/30/2010  . Visit for preventive health examination 12/30/2010  . Hypertension 07/21/2010  . Chronic back pain 07/21/2010  . Vitamin D deficiency 07/21/2010  . SHORTNESS OF BREATH 05/01/2010  . UNSPECIFIED ESSENTIAL HYPERTENSION 04/18/2010  . OSTEOARTHROS UNSPEC GEN/LOC OTH SPEC SITES 04/18/2010  . KNEE PAIN, LEFT 04/18/2010    Tysheem Accardo, MPT 08/06/2014, 11:39 AM  [place] [facility] [address], [place], [address] Phone: [phone]   Fax:  [phone]

## 2014-08-06 NOTE — Patient Instructions (Signed)
Hamstring Curl: Standing (Single Leg)   Do not use band. Just stand at kitchen counter and bend left knee, foot toward buttock. Repeat 20__ times per set. Repeat with other leg. Do _1_ sets per session. Do _1_ sessions per week.  http://tub.exer.us/198   Copyright  VHI. All rights reserved.  KNEE: Extension, Long Arc Quads - Sitting   Raise leg until knee is straight. __20_ reps per set, _1__ sets per day.  Copyright  VHI. All rights reserved.  EXTENSION: Standing (Active)   Stand, both feet flat. Draw right leg behind body as far as possible.  Complete __1_ sets of __20_ repetitions. Perform __1_ sessions per day.  http://gtsc.exer.us/77   Copyright  VHI. All rights reserved.  Hip Flexors / Quadriceps   Do not use tubing. Pull leg forward with straight knee. Keep head and back straight. Do not allow pelvis to tilt or rotate.  Hold _3___ seconds. Repeat _20___ times. Do __1__ sessions per day. CAUTION: Move slowly. May lightly hold chair for stability.  Copyright  VHI. All rights reserved.  ABDUCTION: Standing (Active)   Stand, feet flat. Lift right leg out to side.  Complete __1_ sets of _20__ repetitions. Perform _1__ sessions per day.  http://gtsc.exer.us/111   Copyright  VHI. All rights reserved.  Back Wall Slide   With feet __6-8__ inches from wall, lean as much of back against the wall as possible. Gently squat down.  Do NOT go too deep.  It should not increase your knee pain. Keep your back against wall. Hold _3-4___ seconds. Repeat __2x10__ times. Do __1_ sessions per day.  http://gt2.exer.us/564   Copyright  VHI. All rights reserved.

## 2014-08-08 ENCOUNTER — Ambulatory Visit: Payer: BLUE CROSS/BLUE SHIELD | Admitting: Physical Therapy

## 2014-08-08 DIAGNOSIS — R262 Difficulty in walking, not elsewhere classified: Secondary | ICD-10-CM

## 2014-08-08 DIAGNOSIS — M6281 Muscle weakness (generalized): Secondary | ICD-10-CM

## 2014-08-08 DIAGNOSIS — M5432 Sciatica, left side: Secondary | ICD-10-CM | POA: Diagnosis not present

## 2014-08-08 NOTE — Therapy (Signed)
[place] [facility] [address], [place], [address] Phone: [phone]   Fax:  [phone]  Physical Therapy Treatment and Progress Note  Patient Details  Name: Gina Frazier MRN: [id number] Date of Birth: 1946/03/02 Referring Provider:  Cecilie Kicks, PA-C  Encounter Date: 08/08/2014      PT End of Session - 08/08/14 1137    Visit Number 6   Number of Visits 8   Date for PT Re-Evaluation 08/16/14   PT Start Time 9211   PT Stop Time 1112   PT Time Calculation (min) 49 min   Activity Tolerance Patient tolerated treatment well;Other (comment)  Pt improved with practice on step ups and had no knee pain with this activity.   Behavior During Therapy [facility] for tasks assessed/performed      Past Medical History  Diagnosis Date  . Hypertension   . Palpitations   . Osteoarthritis   . Chronic back pain   . Hip joint pain     left  . Anxiety   . Family history of adverse reaction to anesthesia     pts daughter had severe vomiting 2014   . Dysrhythmia     A Fib     Past Surgical History  Procedure Laterality Date  . Cholecystectomy    . Tooth extraction    . Knee arthroscopy Left 06/28/2014    Procedure: ARTHROSCOPY KNEE WITH PARTIALLATERAL MENISECTOMY AND DEBRIDEMENT;  Surgeon: Susa Day, MD;  Location: [facility];  Service: Orthopedics;  Laterality: Left;    There were no vitals filed for this visit.  Visit Diagnosis:  Muscle weakness  Difficulty walking      Subjective Assessment - 08/08/14 1116    Subjective Pt states she has no knee pain upon arrival, but is very discouraged today because she went up and down her stairs (16 in total) 2-3 times yesterday at home and that her knee became painful and swollen after doing so.  She iced it, and woke up with less swelling and the pain has subsided this morning.  She states that she is having to go up and down the stairs sideways because  the L LE is still  weak.  She states, "I'm used to just going and going.  I want to get going again and get back to work."   Pertinent History Long history of LBP and L knee pain   Currently in Pain? No/denies   Pain Score 0-No pain            [facility] PT Assessment - 08/08/14 0001    Assessment   Medical Diagnosis s/p L knee meniscal repair   Next MD Visit 08/09/14   Observation/Other Assessments   Observations Well healing incision sites at med and lat L knee; swelling at medial/sup aspect of L knee   Lower Extremity Functional Scale  27 out of 80   Observation/Other Assessments-Edema    Edema Circumferential   Circumferential Edema   Circumferential - Left  43 cm L knee vs. 39.5 cm R knee circumference   AROM   Overall AROM  Deficits   AROM Assessment Site Knee   Right/Left Knee Left   Left Knee Extension -8   Left Knee Flexion 110   PROM   Overall PROM  Deficits   PROM Assessment Site Knee   Right/Left Knee Left   Left Knee Extension -5   Left Knee Flexion 114   Strength   Overall Strength Deficits  Overall Strength Comments Pt is making slow gains in strength; improved on average 1/2-1 muscle grade throughout since initial eval   Palpation   Palpation comment No longer tender to palp at lateral aspect of L knee, but still has moderate tend to palp at superior/medial aspect of L knee and superior aspect of patella   Ambulation/Gait   Gait Comments Pt's gait speed has improved and is less antalgic than last week.  She continues to stand and ambulate with excessive genu valgus, L>R.                     [facility] Adult PT Treatment/Exercise - 08/08/14 0001    Knee/Hip Exercises: Aerobic   Stationary Bike Nustep L2 x 8 mins focusing on full L knee AROM flexion and extension   Knee/Hip Exercises: Standing   Wall Squat 2 sets;10 reps;Other (comment)  stdg mini squats with hand on plinth   Stairs step ups on 4" step with L LE leading and no use of UE's; 3x10   Other Standing Knee  Exercises SLR hip 4-ways with 2# ankle wt 2x10 each   Knee/Hip Exercises: Seated   Other Seated Knee Exercises HS curls with RTB 2x10   Knee/Hip Exercises: Supine   Short Arc Quad Sets Left;2 sets;10 reps;Other (comment)  2#   Straight Leg Raises 2 sets;10 reps;Strengthening   Patellar Mobs in all directions by PT   Knee/Hip Exercises: Sidelying   Clams 2x10 on L  added 2# weight to L thigh   Modalities   Modalities Cryotherapy   Cryotherapy   Number Minutes Cryotherapy 10 Minutes   Cryotherapy Location Knee   Type of Cryotherapy Ice pack   Manual Therapy   Manual Therapy Passive ROM   Passive ROM PROM into L knee flex and extension in supine and stretches to L hip and knee in sidelying                PT Education - 08/08/14 1136    Education provided Yes   Education Details Instructed pt to add step ups at home on 4" step leading with L LE    Person(s) Educated Patient   Methods Explanation;Demonstration;Verbal cues   Comprehension Returned demonstration;Verbalized understanding             PT Long Term Goals - 08/08/14 1150    PT LONG TERM GOAL #1   Title Pt will have full active and passive ROM, and improved muscle strength by at least 1 muscle grade at the L knee, allowing her to descend stairs with less difficulty.   Time 4   Period Weeks   Status On-going  Pt has made gains in L knee flexion and extension  active and passive ROM, but still has deficits.   PT LONG TERM GOAL #2   Title Pt will have an improved Lower Extremity Functional Scale score to at least 40/80 or better indicating much less disability due to L LE pain and weakness.    Baseline 15 out of 80 currently   Time 4   Period Weeks   Status On-going  Pt has improved to 27 out of 80   PT LONG TERM GOAL #3   Title Pt will be edcuated in and independent with a home exercise program to improve L LE strength, ROM, and flexibility.   Time 4   Period Weeks   Status Achieved   PT LONG TERM  GOAL #4   Title Pt will report being  able to ambulate and stand for longer periods, and walk community distances with less pain and difficulty.   Time 4   Period Weeks   Status Achieved   PT LONG TERM GOAL #5   Title Pt will be able to ascend and descend stairs in her home with a normal, reciprocal gait pattern.   Time 3   Period Weeks   Status New               Plan - 08/08/14 1140    Clinical Impression Statement Ms. Colborn has made the following improvements since beginning PT: less antalgic gait pattern and increased gait speed; decreased pain and subjective complaints with ambulation, and in sitting; she has an improved  Lower Extremity Functional Scale score of 27 out of 80 (was 15 out of 80 initially); improved active and passive ROM at the L knee; small improvements in strength throughout the L LE.  She had a difficult start to PT due to a very high level of pain with all ther ex's and PT activities.  The first 3 sessions she could not tolerate more than quad and hamstring sets and gentle ROM.  She is now able to tolerate more advanced ther ex's.  She continues to have swelling at the medial/sup aspect of the L knee.  She was advised early on in PT to use an cane to decrease strain on the L knee with ambulation.  She did not do this over the first 1-2 weeks.  After further encouragment to use and A device, she began using 1 axillary crutch at home and this made a large difference in her compalints.  She returned to clinic being able to ambulate with no pain and improved cadence.  She has since stopped using any A device.  She continues to need PT for ROM and strengthening and gait training on stairs.  She has just gotten to the point where she can tolerated gait trg on stairs due to a decrease in pain.  Recommend continuing skilled PT to continue strengthening the L LE, work on gait training with stairs, and to improve ROM and decrease swelling at the L knee.   Clinical Impairments  Affecting Rehab Potential Long hx of LBP and L knee pain   PT Next Visit Plan gait training on stairs; strengthening and ROM activities for L LE   PT Home Exercise Plan continue with initial HEP and added stdg SLR 4 ways and step ups on 4" step to HEP   Consulted and Agree with Plan of Care Patient        Problem List Patient Active Problem List   Diagnosis Date Noted  . Atrial fibrillation 05/01/2014  . Snoring 05/01/2014  . Anxiety attack 12/30/2010  . Visit for preventive health examination 12/30/2010  . Hypertension 07/21/2010  . Chronic back pain 07/21/2010  . Vitamin D deficiency 07/21/2010  . SHORTNESS OF BREATH 05/01/2010  . UNSPECIFIED ESSENTIAL HYPERTENSION 04/18/2010  . OSTEOARTHROS UNSPEC GEN/LOC OTH SPEC SITES 04/18/2010  . KNEE PAIN, LEFT 04/18/2010    Ahlia Lemanski, MPT 08/08/2014, 11:54 AM  [place] [facility] [address] [place], [address] Phone: [phone]   Fax:  [phone]

## 2014-08-29 ENCOUNTER — Telehealth: Payer: Self-pay | Admitting: *Deleted

## 2014-08-29 NOTE — Telephone Encounter (Signed)
Spoke w/ pt.  She reports a tingling in her face for a couple of weeks and she had an episode of elevated dizziness. Pt does not check her BP regularly and does not have any other readings to report. She states that she feels find now.  Advised her to monitor her BP and call if readings remain elevated. Advised her that she was given instructions at her last ov and to follow these if she feels that her HR is out of rhythm. She verbalizes understanding and will call back w/ BP readings.

## 2014-08-29 NOTE — Telephone Encounter (Signed)
Patient's daughter called and her mother experienced: dizziness and tingling of the face and her bp is 144/111. Patient is worried can you please call her.

## 2014-09-24 ENCOUNTER — Ambulatory Visit (INDEPENDENT_AMBULATORY_CARE_PROVIDER_SITE_OTHER): Payer: BLUE CROSS/BLUE SHIELD | Admitting: Cardiovascular Disease

## 2014-09-24 ENCOUNTER — Encounter: Payer: Self-pay | Admitting: Cardiovascular Disease

## 2014-09-24 VITALS — BP 140/78 | HR 56 | Ht 62.0 in | Wt 166.2 lb

## 2014-09-24 DIAGNOSIS — I10 Essential (primary) hypertension: Secondary | ICD-10-CM

## 2014-09-24 DIAGNOSIS — R0602 Shortness of breath: Secondary | ICD-10-CM | POA: Diagnosis not present

## 2014-09-24 DIAGNOSIS — I480 Paroxysmal atrial fibrillation: Secondary | ICD-10-CM | POA: Diagnosis not present

## 2014-09-24 MED ORDER — TRAZODONE HCL 100 MG PO TABS
100.0000 mg | ORAL_TABLET | Freq: Every evening | ORAL | Status: DC | PRN
Start: 2014-09-24 — End: 2015-06-12

## 2014-09-24 NOTE — Progress Notes (Signed)
Patient ID: Gina Frazier, female    DOB: [birth year], 69 y.o.   MRN: [id number]  HPI Comments: Ms. Friar is a 69 year old woman seen several years ago in 2012 for acute hypertension in the dentist chair, recently presenting to the hospital with new onset atrial fibrillation on 04/28/2014, started on Cardizem, Lopressor converting to normal sinus rhythm.. Prior episode 6 months prior. She was started on anticoagulation with eliquis 5 mg by mouth twice a day, also started on flecainide 50 mid grams twice a day, continued on beta blocker. She presents today for follow-up of her atrial fibrillation  In follow-up today, she reports that the swelling of her house in March 2016 fell through. The woman did not want to by her house. At that time she had significant hypertension felt secondary to stress she reports systolic pressure of 767. Everyone in the family was crying at the time. They had been preparing to move several days later. In follow-up today she feels better, denies any tachycardia concerning for atrial fibrillation She has done well on long-acting propranolol which she was taking previously (metoprolol held for fatigue) significant snoring at nighttime. This has been a chronic issue  EKG on today's visit shows normal sinus rhythm with rate 56 bpm, nonspecific T wave abnormality  Other past medical history Echocardiogram from the hospital shows ejection fraction 60-65%, otherwise normal study    Allergies  Allergen Reactions  . Fish Allergy     REACTION: vomitting, swelling    Outpatient Encounter Prescriptions as of 09/24/2014  Medication Sig  . apixaban (ELIQUIS) 5 MG TABS tablet Take 1 tablet (5 mg total) by mouth 2 (two) times daily.  . Calcium Carbonate-Vitamin D (CALCIUM 600 + D PO) Take 1 tablet by mouth 2 (two) times daily.   . Cholecalciferol (VITAMIN D) 2000 UNITS tablet Take 2,000 Units by mouth every morning.   . Diclofenac Sodium (PENNSAID TD) Place 1 application  onto the skin 2 (two) times daily as needed (knee pain.).  Marland Kitchen flecainide (TAMBOCOR) 50 MG tablet Take 1 tablet (50 mg total) by mouth 2 (two) times daily.  Marland Kitchen glucosamine-chondroitin 500-400 MG tablet Take 2 tablets by mouth every morning.   Marland Kitchen HYDROcodone-acetaminophen (NORCO/VICODIN) 5-325 MG per tablet Take 1 tablet by mouth every 4 (four) hours as needed.  . Multiple Vitamins-Minerals (ALIVE WOMENS 50+ PO) Take 1 tablet by mouth every morning.  . nitroGLYCERIN (NITROSTAT) 0.4 MG SL tablet Place 1 tablet (0.4 mg total) under the tongue every 5 (five) minutes as needed for chest pain.  Marland Kitchen OVER THE COUNTER MEDICATION Take 2 each by mouth every morning. Womens energy metabolism support chewable.  . propranolol (INDERAL) 20 MG tablet Take 1 tablet (20 mg total) by mouth 3 (three) times daily as needed. (Patient taking differently: Take 20 mg by mouth daily as needed (As an emergency dose when she goes to the dentist). )  . propranolol ER (INDERAL LA) 80 MG 24 hr capsule Take 1 capsule (80 mg total) by mouth daily.  . traZODone (DESYREL) 100 MG tablet Take 1 tablet (100 mg total) by mouth at bedtime as needed for sleep.  . [DISCONTINUED] traZODone (DESYREL) 50 MG tablet Take 1 tablet (50 mg total) by mouth at bedtime. (Patient taking differently: Take 50 mg by mouth at bedtime as needed for sleep. )   No facility-administered encounter medications on file as of 09/24/2014.    Past Medical History  Diagnosis Date  . Hypertension   . Palpitations   .  Osteoarthritis   . Chronic back pain   . Hip joint pain     left  . Anxiety   . Family history of adverse reaction to anesthesia     pts daughter had severe vomiting 2014   . Dysrhythmia     A Fib     Past Surgical History  Procedure Laterality Date  . Cholecystectomy    . Tooth extraction    . Knee arthroscopy Left 06/28/2014    Procedure: ARTHROSCOPY KNEE WITH PARTIALLATERAL MENISECTOMY AND DEBRIDEMENT;  Surgeon: Susa Day, MD;  Location:  [facility];  Service: Orthopedics;  Laterality: Left;    Social History  reports that she has never smoked. She has never used smokeless tobacco. She reports that she does not drink alcohol or use illicit drugs.  Family History family history includes Arthritis in her daughter; Depression in her sister; Diabetes in her mother and sister; GER disease in her daughter; Heart disease in her brother, father, and mother; Hypertension in her brother, mother, and sister; Hypothyroidism in her daughter; Mental illness in her mother.   Review of Systems  Constitutional: Negative.   Respiratory: Negative.   Cardiovascular: Negative.   Gastrointestinal: Negative.   Musculoskeletal: Negative.   Skin: Negative.   Neurological: Negative.   Hematological: Negative.   Psychiatric/Behavioral: Negative.   All other systems reviewed and are negative.  BP 140/78 mmHg  Pulse 56  Ht 5\' 2"  (1.575 m)  Wt 166 lb 4 oz (75.411 kg)  BMI 30.40 kg/m2   Physical Exam  Constitutional: She is oriented to person, place, and time. She appears well-developed and well-nourished.  HENT:  Head: Normocephalic.  Nose: Nose normal.  Mouth/Throat: Oropharynx is clear and moist.  Eyes: Conjunctivae are normal. Pupils are equal, round, and reactive to light.  Neck: Normal range of motion. Neck supple. No JVD present.  Cardiovascular: Normal rate, regular rhythm, S1 normal, S2 normal, normal heart sounds and intact distal pulses.  Exam reveals no gallop and no friction rub.   No murmur heard. Pulmonary/Chest: Effort normal and breath sounds normal. No respiratory distress. She has no wheezes. She has no rales. She exhibits no tenderness.  Abdominal: Soft. Bowel sounds are normal. She exhibits no distension. There is no tenderness.  Musculoskeletal: Normal range of motion. She exhibits no edema or tenderness.  Lymphadenopathy:    She has no cervical adenopathy.  Neurological: She is alert and oriented to person, place, and  time. Coordination normal.  Skin: Skin is warm and dry. No rash noted. No erythema.  Psychiatric: She has a normal mood and affect. Her behavior is normal. Judgment and thought content normal.    Assessment and Plan  Nursing note and vitals reviewed.

## 2014-09-24 NOTE — Patient Instructions (Addendum)
You are doing well.  If your blood pressure runs high, Lay down, Take nitro  Increase the trazodone up to 100 mg as needed for sleep  Please call us if you have new issues that need to be addressed before your next appt.  Your physician wants you to follow-up in: 12 months.  You will receive a reminder letter in the mail two months in advance. If you don't receive a letter, please call our office to schedule the follow-up appointment.

## 2014-09-24 NOTE — Assessment & Plan Note (Signed)
Shortness of breath relatively well controlled on today's visit Encouraged her to continue her exercise program.

## 2014-09-24 NOTE — Assessment & Plan Note (Addendum)
Blood pressure is well controlled on today's visit. No changes made to the medications. Recent high blood pressure likely secondary to stress

## 2014-09-24 NOTE — Assessment & Plan Note (Signed)
Recommended that she stay on her current medications. She denies recent episodes of tachycardia concerning for recurrent atrial fibrillation

## 2014-09-25 ENCOUNTER — Telehealth: Payer: Self-pay

## 2014-09-25 NOTE — Telephone Encounter (Signed)
Call from patient stating that Dr. Rockey Situ was going to refer her for Colonoscopy. Cannot see referral entered at this time. Patient will need office appointment as she is currently taking Eliquis.   Please schedule patient for appointment when referral arrives.

## 2014-09-26 ENCOUNTER — Encounter: Payer: Self-pay | Admitting: Gastroenterology

## 2014-09-26 NOTE — Telephone Encounter (Signed)
Appointment made per Angie. Referral has not been received.

## 2014-10-15 ENCOUNTER — Telehealth: Payer: Self-pay | Admitting: *Deleted

## 2014-10-15 NOTE — Telephone Encounter (Signed)
Spoke w/ pt's daughter.  She reports that pt is c/o b/l foot & ankle edema. Reports that this has never happened to pt before.  Pt usually follows a low sodium diet, but has eaten spaghetti & lasagna recently and thinks the sauce may have been high in sodium. Denies chest pain or SOB. Pt tries to keep feet elevated when sitting, but this has not helped sx. Pt does not have compression hose or fluid pill. Advised her that I will make Dr. Rockey Situ aware and call her back w/ his recommendation.

## 2014-10-15 NOTE — Telephone Encounter (Signed)
Pt daughter calling stating that pt is having swelling in feet Where she is having problems walking Pt states it is very pain full Pt c/o swelling: STAT is pt has developed SOB within 24 hours  1. How long have you been experiencing swelling? A week but getting bad  2. Where is the swelling located? feet  3.  Are you currently taking a "fluid pill"? No, currently is not on any  4.  Are you currently SOB? No   5.  Have you traveled recently? No

## 2014-10-16 NOTE — Telephone Encounter (Signed)
Would take Lasix 20 mg with potassium 10 mEq every morning for 3 days  Than would take as needed for leg edema

## 2014-10-17 MED ORDER — POTASSIUM CHLORIDE ER 10 MEQ PO TBCR
10.0000 meq | EXTENDED_RELEASE_TABLET | Freq: Every day | ORAL | Status: DC
Start: 2014-10-17 — End: 2015-01-28

## 2014-10-17 MED ORDER — FUROSEMIDE 20 MG PO TABS: 20.0000 mg | ORAL_TABLET | Freq: Every day | ORAL | Status: DC

## 2014-10-17 NOTE — Telephone Encounter (Signed)
Spoke w/ pt's daughter. Advised her of Dr. Donivan Scull recommendation.  She verbalizes understanding and will call back if pt's sx do not improve.

## 2014-10-22 ENCOUNTER — Ambulatory Visit (INDEPENDENT_AMBULATORY_CARE_PROVIDER_SITE_OTHER): Payer: BLUE CROSS/BLUE SHIELD | Admitting: Gastroenterology

## 2014-10-22 ENCOUNTER — Encounter: Payer: Self-pay | Admitting: Gastroenterology

## 2014-10-22 VITALS — BP 148/74 | HR 57 | Temp 98.3°F | Ht 62.0 in | Wt 168.0 lb

## 2014-10-22 DIAGNOSIS — Z1211 Encounter for screening for malignant neoplasm of colon: Secondary | ICD-10-CM

## 2014-10-22 NOTE — Progress Notes (Signed)
Gastroenterology Consultation  Referring Provider:     Arnetha Courser, MD Primary Care Physician:  Enid Derry, MD Primary Gastroenterologist:  Dr. Allen Norris     Reason for Consultation:     Need for colonoscopy        HPI:   Gina Frazier is a 69 y.o. y/o female referred for consultation & management of need for colonoscopy by Dr. Enid Derry, MD.  This patient comes in today with a report of never having a colonoscopy in the past. The patient comes with her daughter. The patient has not had a colonoscopy because she has been too nervous. The patient is presently on a blood thinnerfor atrial fibrillation. He has no other complaints.   Past Medical History  Diagnosis Date  . Hypertension   . Palpitations   . Osteoarthritis   . Chronic back pain   . Hip joint pain     left  . Anxiety   . Family history of adverse reaction to anesthesia     pts daughter had severe vomiting 2014   . Dysrhythmia     A Fib     Past Surgical History  Procedure Laterality Date  . Cholecystectomy    . Tooth extraction    . Knee arthroscopy Left 06/28/2014    Procedure: ARTHROSCOPY KNEE WITH PARTIALLATERAL MENISECTOMY AND DEBRIDEMENT;  Surgeon: Susa Day, MD;  Location: [facility];  Service: Orthopedics;  Laterality: Left;    Prior to Admission medications   Medication Sig Start Date End Date Taking? Authorizing Provider  apixaban (ELIQUIS) 5 MG TABS tablet Take 1 tablet (5 mg total) by mouth 2 (two) times daily. 05/01/14  Yes Minna Merritts, MD  Calcium Carbonate-Vitamin D (CALCIUM 600 + D PO) Take 1 tablet by mouth 2 (two) times daily.    Yes Historical Provider, MD  Cholecalciferol (VITAMIN D) 2000 UNITS tablet Take 2,000 Units by mouth every morning.    Yes Historical Provider, MD  Diclofenac Sodium (PENNSAID TD) Place 1 application onto the skin 2 (two) times daily as needed (knee pain.).   Yes Historical Provider, MD  flecainide (TAMBOCOR) 50 MG tablet Take 1 tablet (50 mg total) by mouth 2  (two) times daily. 05/01/14  Yes Minna Merritts, MD  furosemide (LASIX) 20 MG tablet Take 1 tablet (20 mg total) by mouth daily. 10/17/14  Yes Minna Merritts, MD  glucosamine-chondroitin 500-400 MG tablet Take 2 tablets by mouth every morning.    Yes Historical Provider, MD  Multiple Vitamins-Minerals (ALIVE WOMENS 50+ PO) Take 1 tablet by mouth every morning.   Yes Historical Provider, MD  OVER THE COUNTER MEDICATION Take 2 each by mouth every morning. Womens energy metabolism support chewable.   Yes Historical Provider, MD  potassium chloride (K-DUR) 10 MEQ tablet Take 1 tablet (10 mEq total) by mouth daily. 10/17/14  Yes Minna Merritts, MD  propranolol (INDERAL) 20 MG tablet Take 1 tablet (20 mg total) by mouth 3 (three) times daily as needed. Patient taking differently: Take 20 mg by mouth daily as needed (As an emergency dose when she goes to the dentist).  05/01/14  Yes Minna Merritts, MD  propranolol ER (INDERAL LA) 80 MG 24 hr capsule Take 1 capsule (80 mg total) by mouth daily. 05/01/14  Yes Minna Merritts, MD  traZODone (DESYREL) 100 MG tablet Take 1 tablet (100 mg total) by mouth at bedtime as needed for sleep. 09/24/14  Yes Minna Merritts, MD  HYDROcodone-acetaminophen (NORCO/VICODIN)  5-325 MG per tablet Take 1 tablet by mouth every 4 (four) hours as needed. Patient not taking: Reported on 10/22/2014 06/28/14   Susa Day, MD  nitroGLYCERIN (NITROSTAT) 0.4 MG SL tablet Place 1 tablet (0.4 mg total) under the tongue every 5 (five) minutes as needed for chest pain. 09/28/13 09/28/14  Marletta Lor, MD    Family History  Problem Relation Age of Onset  . Heart disease Mother   . Hypertension Mother   . Mental illness Mother   . Diabetes Mother   . Heart disease Father   . Hypertension Sister   . Depression Sister   . Diabetes Sister   . Heart disease Brother   . Hypertension Brother   . Arthritis Daughter   . GER disease Daughter   . Hypothyroidism Daughter       History  Substance Use Topics  . Smoking status: Never Smoker   . Smokeless tobacco: Never Used  . Alcohol Use: No    Allergies as of 10/22/2014 - Review Complete 10/22/2014  Allergen Reaction Noted  . Fish allergy  04/19/2010    Review of Systems:    All systems reviewed and negative except where noted in HPI.   Physical Exam:  BP 148/74 mmHg  Pulse 57  Temp(Src) 98.3 F (36.8 C) (Oral)  Ht 5\' 2"  (1.575 m)  Wt 168 lb (76.204 kg)  BMI 30.72 kg/m2 No LMP recorded. Patient is postmenopausal. Psych:  Alert and cooperative. Normal mood and affect. General:   Alert,  Well-developed, well-nourished, pleasant and cooperative in NAD Head:  Normocephalic and atraumatic. Eyes:  Sclera clear, no icterus.   Conjunctiva pink. Ears:  Normal auditory acuity. Nose:  No deformity, discharge, or lesions. Mouth:  No deformity or lesions,oropharynx pink & moist. Neck:  Supple; no masses or thyromegaly. Lungs:  Respirations even and unlabored.  Clear throughout to auscultation.   No wheezes, crackles, or rhonchi. No acute distress. Heart:  Regular rate and rhythm; no murmurs, clicks, rubs, or gallops. Abdomen:  Normal bowel sounds.  No bruits.  Soft, non-tender and non-distended without masses, hepatosplenomegaly or hernias noted.  No guarding or rebound tenderness.  Negative Carnett sign.   Rectal:  Deferred.  Msk:  Symmetrical without gross deformities.  Good, equal movement & strength bilaterally. Pulses:  Normal pulses noted. Extremities:  No clubbing or edema.  No cyanosis. Neurologic:  Alert and oriented x3;  grossly normal neurologically. Skin:  Intact without significant lesions or rashes.  No jaundice. Lymph Nodes:  No significant cervical adenopathy. Psych:  Alert and cooperative. Normal mood and affect.  Imaging Studies: No results found.  Assessment and Plan:   Gina Frazier is a 68 y.o. y/o female who is in need of screening colonoscopy. The patient is presently on a  blood thinner for age of fibrillation. The patient's cardiologist will be contacted to see when and for how long the blood thinner can be stopped. The patient has been explained the plan and agrees with it.I have discussed risks & benefits which include, but are not limited to, bleeding, infection, perforation & drug reaction.  The patient agrees with this plan & written consent will be obtained.      Note: This dictation was prepared with Dragon dictation along with smaller phrase technology. Any transcriptional errors that result from this process are unintentional.

## 2014-10-22 NOTE — Progress Notes (Signed)
See Dr. Donivan Scull note.

## 2014-11-02 ENCOUNTER — Telehealth: Payer: Self-pay | Admitting: Gastroenterology

## 2014-11-02 NOTE — Telephone Encounter (Signed)
Please call patient  - she said she was supposed to call today to find out when to stop taking Eloquis because her procedure is Monday.

## 2014-11-05 ENCOUNTER — Ambulatory Visit: Payer: BLUE CROSS/BLUE SHIELD | Admitting: Anesthesiology

## 2014-11-05 ENCOUNTER — Encounter
Admission: RE | Disposition: A | Discharge status: Home / Self Care | Payer: Self-pay | Source: Ambulatory Visit | Attending: Gastroenterology

## 2014-11-05 ENCOUNTER — Encounter: Payer: Self-pay | Admitting: *Deleted

## 2014-11-05 ENCOUNTER — Other Ambulatory Visit: Payer: Self-pay

## 2014-11-05 ENCOUNTER — Ambulatory Visit
Admission: RE | Admit: 2014-11-05 | Discharge: 2014-11-05 | Disposition: A | Discharge status: Home / Self Care | Payer: BLUE CROSS/BLUE SHIELD | Source: Ambulatory Visit | Attending: Gastroenterology | Admitting: Gastroenterology

## 2014-11-05 DIAGNOSIS — Z8379 Family history of other diseases of the digestive system: Secondary | ICD-10-CM | POA: Diagnosis not present

## 2014-11-05 DIAGNOSIS — Z79899 Other long term (current) drug therapy: Secondary | ICD-10-CM | POA: Insufficient documentation

## 2014-11-05 DIAGNOSIS — R002 Palpitations: Secondary | ICD-10-CM | POA: Diagnosis not present

## 2014-11-05 DIAGNOSIS — Z8249 Family history of ischemic heart disease and other diseases of the circulatory system: Secondary | ICD-10-CM | POA: Diagnosis not present

## 2014-11-05 DIAGNOSIS — M25552 Pain in left hip: Secondary | ICD-10-CM | POA: Insufficient documentation

## 2014-11-05 DIAGNOSIS — M549 Dorsalgia, unspecified: Secondary | ICD-10-CM | POA: Diagnosis not present

## 2014-11-05 DIAGNOSIS — Z1211 Encounter for screening for malignant neoplasm of colon: Secondary | ICD-10-CM | POA: Insufficient documentation

## 2014-11-05 DIAGNOSIS — Z8261 Family history of arthritis: Secondary | ICD-10-CM | POA: Insufficient documentation

## 2014-11-05 DIAGNOSIS — K640 First degree hemorrhoids: Secondary | ICD-10-CM | POA: Diagnosis not present

## 2014-11-05 DIAGNOSIS — G8929 Other chronic pain: Secondary | ICD-10-CM | POA: Insufficient documentation

## 2014-11-05 DIAGNOSIS — F419 Anxiety disorder, unspecified: Secondary | ICD-10-CM | POA: Insufficient documentation

## 2014-11-05 DIAGNOSIS — Z9049 Acquired absence of other specified parts of digestive tract: Secondary | ICD-10-CM | POA: Insufficient documentation

## 2014-11-05 DIAGNOSIS — Z833 Family history of diabetes mellitus: Secondary | ICD-10-CM | POA: Insufficient documentation

## 2014-11-05 DIAGNOSIS — K5730 Diverticulosis of large intestine without perforation or abscess without bleeding: Secondary | ICD-10-CM | POA: Diagnosis not present

## 2014-11-05 DIAGNOSIS — I10 Essential (primary) hypertension: Secondary | ICD-10-CM | POA: Diagnosis not present

## 2014-11-05 DIAGNOSIS — M1990 Unspecified osteoarthritis, unspecified site: Secondary | ICD-10-CM | POA: Insufficient documentation

## 2014-11-05 DIAGNOSIS — I4891 Unspecified atrial fibrillation: Secondary | ICD-10-CM | POA: Insufficient documentation

## 2014-11-05 DIAGNOSIS — Z8349 Family history of other endocrine, nutritional and metabolic diseases: Secondary | ICD-10-CM | POA: Diagnosis not present

## 2014-11-05 HISTORY — DX: Presence of dental prosthetic device (complete) (partial): Z97.2

## 2014-11-05 HISTORY — DX: Presence of spectacles and contact lenses: Z97.3

## 2014-11-05 HISTORY — PX: COLONOSCOPY WITH PROPOFOL: SHX5780

## 2014-11-05 SURGERY — COLONOSCOPY WITH PROPOFOL
Anesthesia: Monitor Anesthesia Care | Wound class: Contaminated

## 2014-11-05 MED ORDER — SODIUM CHLORIDE 0.9 % IV SOLN: INTRAVENOUS | Status: DC

## 2014-11-05 MED ORDER — LIDOCAINE HCL (CARDIAC) 20 MG/ML IV SOLN
INTRAVENOUS | Status: DC | PRN
  Administered 2014-11-05: 40 mg via INTRAVENOUS

## 2014-11-05 MED ORDER — LACTATED RINGERS IV SOLN
INTRAVENOUS | Status: DC
  Administered 2014-11-05: 11:00:00 via INTRAVENOUS

## 2014-11-05 MED ORDER — ACETAMINOPHEN 325 MG PO TABS: 325.0000 mg | ORAL_TABLET | ORAL | Status: DC | PRN

## 2014-11-05 MED ORDER — LACTATED RINGERS IV SOLN: INTRAVENOUS | Status: DC

## 2014-11-05 MED ORDER — PROPOFOL 10 MG/ML IV BOLUS
INTRAVENOUS | Status: DC | PRN
  Administered 2014-11-05 (×3): 50 mg via INTRAVENOUS
  Administered 2014-11-05: 100 mg via INTRAVENOUS
  Administered 2014-11-05: 50 mg via INTRAVENOUS

## 2014-11-05 MED ORDER — ACETAMINOPHEN 160 MG/5ML PO SOLN: 325.0000 mg | ORAL | Status: DC | PRN

## 2014-11-05 SURGICAL SUPPLY — 28 items

## 2014-11-05 NOTE — Anesthesia Preprocedure Evaluation (Signed)
Anesthesia Evaluation  Patient identified by MRN, date of birth, ID band  Reviewed: Allergy & Precautions, H&P , NPO status , Patient's Chart, lab work & pertinent test results  Airway Mallampati: II  TM Distance: >3 FB Neck ROM: full    Dental no notable dental hx. (+) Partial Upper   Pulmonary    Pulmonary exam normal       Cardiovascular hypertension, + dysrhythmias Atrial Fibrillation Rhythm:regular Rate:Normal     Neuro/Psych    GI/Hepatic   Endo/Other    Renal/GU      Musculoskeletal   Abdominal   Peds  Hematology   Anesthesia Other Findings   Reproductive/Obstetrics                             Anesthesia Physical Anesthesia Plan  ASA: II  Anesthesia Plan: MAC   Post-op Pain Management:    Induction:   Airway Management Planned:   Additional Equipment:   Intra-op Plan:   Post-operative Plan:   Informed Consent: I have reviewed the patients History and Physical, chart, labs and discussed the procedure including the risks, benefits and alternatives for the proposed anesthesia with the patient or authorized representative who has indicated his/her understanding and acceptance.     Plan Discussed with: CRNA  Anesthesia Plan Comments:         Anesthesia Quick Evaluation

## 2014-11-05 NOTE — Anesthesia Postprocedure Evaluation (Signed)
  Anesthesia Post-op Note  Patient: Gina Frazier  Procedure(s) Performed: Procedure(s): COLONOSCOPY WITH PROPOFOL (N/A)  Anesthesia type:MAC  Patient location: PACU  Post pain: Pain level controlled  Post assessment: Post-op Vital signs reviewed, Patient's Cardiovascular Status Stable, Respiratory Function Stable, Patent Airway and No signs of Nausea or vomiting  Post vital signs: Reviewed and stable  Last Vitals:  Filed Vitals:   11/05/14 1227  BP: 117/56  Pulse: 68  Temp:   Resp: 16    Level of consciousness: awake, alert  and patient cooperative  Complications: No apparent anesthesia complications

## 2014-11-05 NOTE — Anesthesia Procedure Notes (Signed)
Procedure Name: MAC Performed by: Lezly Rumpf Pre-anesthesia Checklist: Patient identified, Emergency Drugs available, Suction available, Timeout performed and Patient being monitored Patient Re-evaluated:Patient Re-evaluated prior to inductionOxygen Delivery Method: Nasal cannula Placement Confirmation: positive ETCO2     

## 2014-11-05 NOTE — Op Note (Signed)
[facility] Gastroenterology Patient Name: Gina Frazier Procedure Date: 11/05/2014 11:56 AM MRN: [id number] Account #: [id number] Date of Birth: 10-08-1945 Admit Type: Outpatient Age: 69 Room: [facility] OR ROOM 01 Gender: Female Note Status: Finalized Procedure:         Colonoscopy Indications:       Screening for colorectal malignant neoplasm Providers:         Lucilla Lame, MD Referring MD:      Arnetha Courser (Referring MD) Medicines:         Propofol per Anesthesia Complications:     No immediate complications. Procedure:         Pre-Anesthesia Assessment:                    - Prior to the procedure, a History and Physical was                     performed, and patient medications and allergies were                     reviewed. The patient's tolerance of previous anesthesia                     was also reviewed. The risks and benefits of the procedure                     and the sedation options and risks were discussed with the                     patient. All questions were answered, and informed consent                     was obtained. Prior Anticoagulants: The patient has taken                     no previous anticoagulant or antiplatelet agents. ASA                     Grade Assessment: II - A patient with mild systemic                     disease. After reviewing the risks and benefits, the                     patient was deemed in satisfactory condition to undergo                     the procedure.                    After obtaining informed consent, the colonoscope was                     passed under direct vision. Throughout the procedure, the                     patient's blood pressure, pulse, and oxygen saturations                     were monitored continuously. The was introduced through                     the anus and advanced to the the cecum, identified by  appendiceal orifice and ileocecal valve. The colonoscopy             was performed without difficulty. The patient tolerated                     the procedure well. The quality of the bowel preparation                     was excellent. Findings:      The perianal and digital rectal examinations were normal.      A few small-mouthed diverticula were found in the entire colon.      Non-bleeding internal hemorrhoids were found during retroflexion. The       hemorrhoids were Grade I (internal hemorrhoids that do not prolapse). Impression:        - Diverticulosis in the entire examined colon.                    - Non-bleeding internal hemorrhoids.                    - No specimens collected. Recommendation:    - Repeat colonoscopy in 10 years for screening unless any                     change in family history or lower GI problems. Procedure Code(s): --- Professional ---                    [phone], Colonoscopy, flexible; diagnostic, including                     collection of specimen(s) by brushing or washing, when                     performed (separate procedure) Diagnosis Code(s): --- Professional ---                    Z12.11, Encounter for screening for malignant neoplasm of                     colon CPT copyright 2014 [employer]. All rights reserved. The codes documented in this report are preliminary and upon coder review may  be revised to meet current compliance requirements. Lucilla Lame, MD 11/05/2014 12:14:07 PM This report has been signed electronically. Number of Addenda: 0 Note Initiated On: 11/05/2014 11:56 AM Scope Withdrawal Time: 0 hours 6 minutes 25 seconds  Total Procedure Duration: 0 hours 10 minutes 58 seconds       [facility]

## 2014-11-05 NOTE — H&P (Signed)
[facility]  [address] [place], [place] [address] Phone: [phone] Fax : [phone]  Primary Care Physician:  Enid Derry, MD Primary Gastroenterologist:  Dr. Allen Norris  Pre-Procedure History & Physical: HPI:  Gina Frazier is a 69 y.o. female is here for a screening colonoscopy.   Past Medical History  Diagnosis Date  . Hypertension   . Palpitations   . Osteoarthritis   . Chronic back pain   . Hip joint pain     left  . Anxiety   . Family history of adverse reaction to anesthesia     pts daughter had severe vomiting 2014   . Dysrhythmia     A Fib   . Wears dentures     partial upper  . Wears contact lenses     Past Surgical History  Procedure Laterality Date  . Cholecystectomy    . Tooth extraction    . Knee arthroscopy Left 06/28/2014    Procedure: ARTHROSCOPY KNEE WITH PARTIALLATERAL MENISECTOMY AND DEBRIDEMENT;  Surgeon: Susa Day, MD;  Location: [facility];  Service: Orthopedics;  Laterality: Left;    Prior to Admission medications   Medication Sig Start Date End Date Taking? Authorizing Provider  apixaban (ELIQUIS) 5 MG TABS tablet Take 1 tablet (5 mg total) by mouth 2 (two) times daily. 05/01/14  Yes Minna Merritts, MD  Calcium Carbonate-Vitamin D (CALCIUM 600 + D PO) Take 1 tablet by mouth 2 (two) times daily.    Yes Historical Provider, MD  Cholecalciferol (VITAMIN D) 2000 UNITS tablet Take 2,000 Units by mouth every morning.    Yes Historical Provider, MD  Diclofenac Sodium (PENNSAID TD) Place 1 application onto the skin 2 (two) times daily as needed (knee pain.).   Yes Historical Provider, MD  flecainide (TAMBOCOR) 50 MG tablet Take 1 tablet (50 mg total) by mouth 2 (two) times daily. 05/01/14  Yes Minna Merritts, MD  furosemide (LASIX) 20 MG tablet Take 1 tablet (20 mg total) by mouth daily. 10/17/14  Yes Minna Merritts, MD  glucosamine-chondroitin 500-400 MG tablet Take 2 tablets by mouth every morning.    Yes Historical  Provider, MD  HYDROcodone-acetaminophen (NORCO/VICODIN) 5-325 MG per tablet Take 1 tablet by mouth every 4 (four) hours as needed. 06/28/14  Yes Susa Day, MD  Multiple Vitamins-Minerals (ALIVE WOMENS 50+ PO) Take 1 tablet by mouth every morning.   Yes Historical Provider, MD  OVER THE COUNTER MEDICATION Take 2 each by mouth every morning. Womens energy metabolism support chewable.   Yes Historical Provider, MD  potassium chloride (K-DUR) 10 MEQ tablet Take 1 tablet (10 mEq total) by mouth daily. 10/17/14  Yes Minna Merritts, MD  propranolol ER (INDERAL LA) 80 MG 24 hr capsule Take 1 capsule (80 mg total) by mouth daily. 05/01/14  Yes Minna Merritts, MD  traZODone (DESYREL) 100 MG tablet Take 1 tablet (100 mg total) by mouth at bedtime as needed for sleep. 09/24/14  Yes Minna Merritts, MD  nitroGLYCERIN (NITROSTAT) 0.4 MG SL tablet Place 1 tablet (0.4 mg total) under the tongue every 5 (five) minutes as needed for chest pain. 09/28/13 09/28/14  Marletta Lor, MD  propranolol (INDERAL) 20 MG tablet Take 1 tablet (20 mg total) by mouth 3 (three) times daily as needed. Patient not taking: Reported on 11/05/2014 05/01/14   Minna Merritts, MD    Allergies as of 11/05/2014 - Review Complete 11/05/2014  Allergen Reaction Noted  . Fish allergy  04/19/2010  Family History  Problem Relation Age of Onset  . Heart disease Mother   . Hypertension Mother   . Mental illness Mother   . Diabetes Mother   . Heart disease Father   . Hypertension Sister   . Depression Sister   . Diabetes Sister   . Heart disease Brother   . Hypertension Brother   . Arthritis Daughter   . GER disease Daughter   . Hypothyroidism Daughter     Social History   Social History  . Marital Status: Single    Spouse Name: N/A  . Number of Children: N/A  . Years of Education: N/A   Occupational History  . Not on file.   Social History Main Topics  . Smoking status: Never Smoker   . Smokeless tobacco: Never  Used  . Alcohol Use: No  . Drug Use: No  . Sexual Activity: Not on file   Other Topics Concern  . Not on file   Social History Narrative    Review of Systems: See HPI, otherwise negative ROS  Physical Exam: BP 144/66 mmHg  Pulse 63  Temp(Src) 98.1 F (36.7 C) (Temporal)  Resp 16  Ht 5\' 2"  (1.575 m)  Wt 157 lb (71.215 kg)  BMI 28.71 kg/m2  SpO2 99% General:   Alert,  pleasant and cooperative in NAD Head:  Normocephalic and atraumatic. Neck:  Supple; no masses or thyromegaly. Lungs:  Clear throughout to auscultation.    Heart:  Regular rate and rhythm. Abdomen:  Soft, nontender and nondistended. Normal bowel sounds, without guarding, and without rebound.   Neurologic:  Alert and  oriented x4;  grossly normal neurologically.  Impression/Plan: Gina Frazier is now here to undergo a screening colonoscopy.  Risks, benefits, and alternatives regarding colonoscopy have been reviewed with the patient.  Questions have been answered.  All parties agreeable.

## 2014-11-05 NOTE — Transfer of Care (Signed)
Immediate Anesthesia Transfer of Care Note  Patient: Gina Frazier  Procedure(s) Performed: Procedure(s): COLONOSCOPY WITH PROPOFOL (N/A)  Patient Location: PACU  Anesthesia Type: MAC  Level of Consciousness: awake, alert  and patient cooperative  Airway and Oxygen Therapy: Patient Spontanous Breathing and Patient connected to supplemental oxygen  Post-op Assessment: Post-op Vital signs reviewed, Patient's Cardiovascular Status Stable, Respiratory Function Stable, Patent Airway and No signs of Nausea or vomiting  Post-op Vital Signs: Reviewed and stable  Complications: No apparent anesthesia complications

## 2014-11-06 ENCOUNTER — Encounter: Payer: Self-pay | Admitting: Gastroenterology

## 2015-01-21 ENCOUNTER — Emergency Department: Payer: BLUE CROSS/BLUE SHIELD

## 2015-01-21 ENCOUNTER — Telehealth: Payer: Self-pay | Admitting: *Deleted

## 2015-01-21 ENCOUNTER — Emergency Department
Admission: EM | Admit: 2015-01-21 | Discharge: 2015-01-21 | Disposition: A | Discharge status: Home / Self Care | Payer: BLUE CROSS/BLUE SHIELD | Attending: Student | Admitting: Student

## 2015-01-21 DIAGNOSIS — I10 Essential (primary) hypertension: Secondary | ICD-10-CM | POA: Diagnosis not present

## 2015-01-21 DIAGNOSIS — Z79899 Other long term (current) drug therapy: Secondary | ICD-10-CM | POA: Diagnosis not present

## 2015-01-21 DIAGNOSIS — Z7902 Long term (current) use of antithrombotics/antiplatelets: Secondary | ICD-10-CM | POA: Insufficient documentation

## 2015-01-21 DIAGNOSIS — R079 Chest pain, unspecified: Secondary | ICD-10-CM | POA: Diagnosis not present

## 2015-01-21 LAB — BASIC METABOLIC PANEL
ANION GAP: 5 (ref 5–15)
BUN: 16 mg/dL (ref 6–20)
CALCIUM: 8.7 mg/dL — AB (ref 8.9–10.3)
CO2: 29 mmol/L (ref 22–32)
Chloride: 104 mmol/L (ref 101–111)
Creatinine, Ser: 0.67 mg/dL (ref 0.44–1.00)
GFR calc Af Amer: >60 mL/min (ref >60)
Glucose, Bld: 96 mg/dL (ref 65–99)
POTASSIUM: 3.8 mmol/L (ref 3.5–5.1)
SODIUM: 138 mmol/L (ref 135–145)

## 2015-01-21 LAB — CBC
HEMATOCRIT: 39.6 % (ref 35.0–47.0)
HEMOGLOBIN: 12.9 g/dL (ref 12.0–16.0)
MCH: 29.7 pg (ref 26.0–34.0)
MCHC: 32.6 g/dL (ref 32.0–36.0)
MCV: 91 fL (ref 80.0–100.0)
Platelets: 247 10*3/uL (ref 150–440)
RBC: 4.35 MIL/uL (ref 3.80–5.20)
RDW: 13.8 % (ref 11.5–14.5)
WBC: 6.3 10*3/uL (ref 3.6–11.0)

## 2015-01-21 LAB — TROPONIN I

## 2015-01-21 MED ORDER — ACETAMINOPHEN 500 MG PO TABS
1000.0000 mg | ORAL_TABLET | Freq: Once | ORAL | Status: AC
  Administered 2015-01-21: 1000 mg via ORAL
  Filled 2015-01-21: qty 2

## 2015-01-21 MED ORDER — ACETAMINOPHEN 500 MG PO TABS: 500.0000 mg | ORAL_TABLET | Freq: Four times a day (QID) | ORAL | Status: DC | PRN

## 2015-01-21 NOTE — ED Provider Notes (Signed)
[facility] Emergency Department Provider Note  ____________________________________________  Time seen: Approximately 4:33 PM  I have reviewed the triage vital signs and the nursing notes.   HISTORY  Chief Complaint Chest Pain    HPI Laniesha Gibby is a 69 y.o. female with history of atrial fibrillation on Elliquist, flecainide, propranolol, hypertension, anxiety who presents for evaluation of 2 weeks gradual onset constant chest pain at the sternal border and into the right breast near the nipple. Currently her symptoms are mild. She feels her symptoms more when she is climbing a ladder at work. She denies any shortness of breath. She feels that if the pain is a "biting pain". No nausea, diaphoresis, vomiting. She reports she feels well but she told her daughter about this and her daughter was worried that she might be having a heart attack because of her history of atrial fibrillation so her daughter made her come to the ER today. No other modifying factors she is otherwise been in her usual state of health without illness. No vomiting, diarrhea, fevers, chills.   Past Medical History  Diagnosis Date  . Hypertension   . Palpitations   . Osteoarthritis   . Chronic back pain   . Hip joint pain     left  . Anxiety   . Family history of adverse reaction to anesthesia     pts daughter had severe vomiting 2014   . Dysrhythmia     A Fib   . Wears dentures     partial upper  . Wears contact lenses     Patient Active Problem List   Diagnosis Date Noted  . Special screening for malignant neoplasms, colon   . Atrial fibrillation ([place]) 05/01/2014  . Snoring 05/01/2014  . Anxiety attack 12/30/2010  . Visit for preventive health examination 12/30/2010  . Hypertension 07/21/2010  . Chronic back pain 07/21/2010  . Vitamin D deficiency 07/21/2010  . SHORTNESS OF BREATH 05/01/2010  . UNSPECIFIED ESSENTIAL HYPERTENSION 04/18/2010  . OSTEOARTHROS UNSPEC GEN/LOC  OTH SPEC SITES 04/18/2010  . KNEE PAIN, LEFT 04/18/2010    Past Surgical History  Procedure Laterality Date  . Cholecystectomy    . Tooth extraction    . Knee arthroscopy Left 06/28/2014    Procedure: ARTHROSCOPY KNEE WITH PARTIALLATERAL MENISECTOMY AND DEBRIDEMENT;  Surgeon: Susa Day, MD;  Location: [facility];  Service: Orthopedics;  Laterality: Left;  . Colonoscopy with propofol N/A 11/05/2014    Procedure: COLONOSCOPY WITH PROPOFOL;  Surgeon: Lucilla Lame, MD;  Location: [facility];  Service: Endoscopy;  Laterality: N/A;    Current Outpatient Rx  Name  Route  Sig  Dispense  Refill  . apixaban (ELIQUIS) 5 MG TABS tablet   Oral   Take 1 tablet (5 mg total) by mouth 2 (two) times daily.   60 tablet   6   . Calcium Carbonate-Vitamin D (CALCIUM 600 + D PO)   Oral   Take 1 tablet by mouth 2 (two) times daily.          . Cholecalciferol (VITAMIN D) 2000 UNITS tablet   Oral   Take 2,000 Units by mouth every morning.          . Diclofenac Sodium (PENNSAID TD)   Transdermal   Place 1 application onto the skin 2 (two) times daily as needed (knee pain.).         Marland Kitchen flecainide (TAMBOCOR) 50 MG tablet   Oral   Take 1 tablet (50 mg total) by  mouth 2 (two) times daily.   180 tablet   3   . furosemide (LASIX) 20 MG tablet   Oral   Take 1 tablet (20 mg total) by mouth daily.   30 tablet   6   . glucosamine-chondroitin 500-400 MG tablet   Oral   Take 2 tablets by mouth every morning.          Marland Kitchen HYDROcodone-acetaminophen (NORCO/VICODIN) 5-325 MG per tablet   Oral   Take 1 tablet by mouth every 4 (four) hours as needed.   40 tablet   0   . Multiple Vitamins-Minerals (ALIVE WOMENS 50+ PO)   Oral   Take 1 tablet by mouth every morning.         Marland Kitchen EXPIRED: nitroGLYCERIN (NITROSTAT) 0.4 MG SL tablet   Sublingual   Place 1 tablet (0.4 mg total) under the tongue every 5 (five) minutes as needed for chest pain.   15 tablet   1   . OVER THE COUNTER MEDICATION    Oral   Take 2 each by mouth every morning. Womens energy metabolism support chewable.         . potassium chloride (K-DUR) 10 MEQ tablet   Oral   Take 1 tablet (10 mEq total) by mouth daily.   30 tablet   6   . propranolol (INDERAL) 20 MG tablet   Oral   Take 1 tablet (20 mg total) by mouth 3 (three) times daily as needed. Patient not taking: Reported on 11/05/2014   90 tablet   3   . propranolol ER (INDERAL LA) 80 MG 24 hr capsule   Oral   Take 1 capsule (80 mg total) by mouth daily.   90 capsule   3   . traZODone (DESYREL) 100 MG tablet   Oral   Take 1 tablet (100 mg total) by mouth at bedtime as needed for sleep.   30 tablet   6     Allergies Fish allergy  Family History  Problem Relation Age of Onset  . Heart disease Mother   . Hypertension Mother   . Mental illness Mother   . Diabetes Mother   . Heart disease Father   . Hypertension Sister   . Depression Sister   . Diabetes Sister   . Heart disease Brother   . Hypertension Brother   . Arthritis Daughter   . GER disease Daughter   . Hypothyroidism Daughter     Social History Social History  Substance Use Topics  . Smoking status: Never Smoker   . Smokeless tobacco: Never Used  . Alcohol Use: No    Review of Systems Constitutional: No fever/chills Eyes: No visual changes. ENT: No sore throat. Cardiovascular: + chest pain. Respiratory: Denies shortness of breath. Gastrointestinal: No abdominal pain.  No nausea, no vomiting.  No diarrhea.  No constipation. Genitourinary: Negative for dysuria. Musculoskeletal: Negative for back pain. Skin: Negative for rash. Neurological: Negative for headaches, focal weakness or numbness.  10-point ROS otherwise negative.  ____________________________________________   PHYSICAL EXAM:  VITAL SIGNS: ED Triage Vitals  Enc Vitals Group     BP 01/21/15 1615 200/86 mmHg     Pulse Rate 01/21/15 1615 75     Resp 01/21/15 1615 18     Temp 01/21/15 1615 98.1  F (36.7 C)     Temp Source 01/21/15 1615 Oral     SpO2 01/21/15 1615 100 %     Weight 01/21/15 1615 168 lb (76.204 kg)  Height 01/21/15 1615 5\' 2"  (1.575 m)     Head Cir --      Peak Flow --      Pain Score --      Pain Loc --      Pain Edu? --      Excl. in [place]? --     Constitutional: Alert and oriented. Well appearing and in no acute distress. Eyes: Conjunctivae are normal. PERRL. EOMI. Head: Atraumatic. Nose: No congestion/rhinnorhea. Mouth/Throat: Mucous membranes are moist.  Oropharynx non-erythematous. Neck: No stridor.   Cardiovascular: Normal rate, regular rhythm. Grossly normal heart sounds.  Good peripheral circulation. Respiratory: Normal respiratory effort.  No retractions. Lungs CTAB. Gastrointestinal: Soft and nontender. No distention. No abdominal bruits. No CVA tenderness. Genitourinary: deferred Musculoskeletal: No lower extremity tenderness nor edema.  No joint effusions. Mild to moderate tenderness to palpation to the right of the sternal border and in the right upper chest wall, palpation reproduces pain. Neurologic:  Normal speech and language. No gross focal neurologic deficits are appreciated. No gait instability. Skin:  Skin is warm, dry and intact. No rash noted. Psychiatric: Mood and affect are normal. Speech and behavior are normal.  ____________________________________________   LABS (all labs ordered are listed, but only abnormal results are displayed)  Labs Reviewed  BASIC METABOLIC PANEL - Abnormal; Notable for the following:    Calcium 8.7 (*)    All other components within normal limits  CBC  TROPONIN I   ____________________________________________  EKG  ED ECG REPORT I, Joanne Gavel, the attending physician, personally viewed and interpreted this ECG.   Date: 01/21/2015  EKG Time: 16:15  Rate: 65  Rhythm: normal sinus rhythm  Axis: normal  Intervals:none  ST&T Change: No acute ST elevation. LVH. EKG unchanged from  09/24/2014  ____________________________________________  RADIOLOGY  CXR  IMPRESSION: No acute cardiopulmonary findings. Mild chronic lung changes.  ____________________________________________   PROCEDURES  Procedure(s) performed: None  Critical Care performed: No  ____________________________________________   INITIAL IMPRESSION / ASSESSMENT AND PLAN / ED COURSE  Pertinent labs & imaging results that were available during my care of the patient were reviewed by me and considered in my medical decision making (see chart for details).  Kember Stillman is a 69 y.o. female with history of atrial fibrillation on Elliquist, flecainide, propranolol, hypertension, anxiety who presents for evaluation of 2 weeks gradual onset constant chest pain at the sternal border and into the right breast near the nipple. On exam, she is very well-appearing and in no acute distress. Hypertensive on arrival, will recheck. The remainder of her vital signs are stable, she is afebrile. She does has reproducible chest wall tenderness to the right of the sternal border and into the superior/anterior chest wall and I suspect her pain is muscular skeletal in nature. EKG is unchanged from prior. Pain has been constant/ongoing for 2 weeks, troponin negative, not consistent with ACS, heart score 3. No shortness of breath, no tachypnea, no hypoxia, doubt PE. Pain not ripping or tearing in nature, does not radiate to the back or to the feet, doubt acute aortic dissection. We'll treat her pain, recheck blood pressure and anticipate discharge home with close cardiology follow-up as well as referral for PCP follow-up. ----------------------------------------- 6:05 PM on 01/21/2015 -----------------------------------------  She reports she feels well after Tylenol. Blood pressure has improved to 133/62 at this time. We discussed return precautions, follow-up as above in the patient and her daughter at bedside are  comfortable with the discharge plan. DC  home. ____________________________________________   FINAL CLINICAL IMPRESSION(S) / ED DIAGNOSES  Final diagnoses:  Chest pain, unspecified chest pain type      Joanne Gavel, MD 01/21/15 1805

## 2015-01-21 NOTE — Telephone Encounter (Signed)
Pt c/o of Chest Pain: STAT if CP now or developed within 24 hours - Patient's daughter called please call her back.  1. Are you having CP right now? yes  2. Are you experiencing any other symptoms (ex. SOB, nausea, vomiting, sweating)? no  3. How long have you been experiencing CP? 5 fsyd  4. Is your CP continuous or coming and going? Continuous.  A disturbing annoying pain (0-10 is 7 pain scale)  5. Have you taken Nitroglycerin?no  ?

## 2015-01-21 NOTE — Telephone Encounter (Signed)
Spoke w/ pt's daughter.  She reports that pt has had right sided chest pain for the past 5 days. Report pain as burning and annoying, is now 7/10 on pain scale.  Denies SOB, n/v or diaphoresis. BP 195/87 on first check, 205/81 while on the phone w/ me. Pt has not taken nitro.  Advise pt's daughter that Dr. Rockey Situ is out of the office today and that if pt is having active chest pain, she needs immediate attention.  Advised her to give pt a nitro and call 911; have EMS perform an EKG. She is agreeable, though she is arguing w/ pt while I am on the phone, as pt does not want to go, she is concerned that her BP machine is not functioning properly.  After some discussion, pt is agreeable to seeing attention now, though daughter prefers to take her to the ED. Asked her to call back if we can be of further assistance.

## 2015-01-21 NOTE — ED Notes (Signed)
Patient transported to X-ray 

## 2015-01-21 NOTE — ED Notes (Signed)
Pt states she has had constant CP to right side of chest, feels like "biting" and "pins and needles" X 2 weeks. Denies nausea, vomiting, or SOB. Pt alert and oriented X4, active, cooperative, pt in NAD. RR even and unlabored, color WNL.

## 2015-01-28 ENCOUNTER — Encounter: Payer: Self-pay | Admitting: Cardiovascular Disease

## 2015-01-28 ENCOUNTER — Ambulatory Visit (INDEPENDENT_AMBULATORY_CARE_PROVIDER_SITE_OTHER): Payer: BLUE CROSS/BLUE SHIELD | Admitting: Cardiovascular Disease

## 2015-01-28 VITALS — BP 150/80 | HR 59 | Ht 62.0 in | Wt 166.8 lb

## 2015-01-28 DIAGNOSIS — R9431 Abnormal electrocardiogram [ECG] [EKG]: Secondary | ICD-10-CM

## 2015-01-28 DIAGNOSIS — R0602 Shortness of breath: Secondary | ICD-10-CM

## 2015-01-28 DIAGNOSIS — I480 Paroxysmal atrial fibrillation: Secondary | ICD-10-CM

## 2015-01-28 DIAGNOSIS — R079 Chest pain, unspecified: Secondary | ICD-10-CM

## 2015-01-28 DIAGNOSIS — I10 Essential (primary) hypertension: Secondary | ICD-10-CM | POA: Diagnosis not present

## 2015-01-28 MED ORDER — PROPRANOLOL HCL ER 80 MG PO CP24: 80.0000 mg | ORAL_CAPSULE | Freq: Every day | ORAL | Status: DC

## 2015-01-28 MED ORDER — FUROSEMIDE 20 MG PO TABS: 20.0000 mg | ORAL_TABLET | Freq: Every day | ORAL | Status: DC

## 2015-01-28 MED ORDER — NITROGLYCERIN 0.4 MG SL SUBL: 0.4000 mg | SUBLINGUAL_TABLET | SUBLINGUAL | Status: DC | PRN

## 2015-01-28 MED ORDER — LOSARTAN POTASSIUM 50 MG PO TABS: 50.0000 mg | ORAL_TABLET | Freq: Every day | ORAL | Status: DC

## 2015-01-28 MED ORDER — POTASSIUM CHLORIDE ER 10 MEQ PO TBCR: 10.0000 meq | EXTENDED_RELEASE_TABLET | Freq: Every day | ORAL | Status: DC

## 2015-01-28 MED ORDER — APIXABAN 5 MG PO TABS: 5.0000 mg | ORAL_TABLET | Freq: Two times a day (BID) | ORAL | Status: DC

## 2015-01-28 MED ORDER — FLECAINIDE ACETATE 50 MG PO TABS: 50.0000 mg | ORAL_TABLET | Freq: Two times a day (BID) | ORAL | Status: DC

## 2015-01-28 NOTE — Assessment & Plan Note (Signed)
Denies any shortness of breath episodes. In general reports that she is doing well

## 2015-01-28 NOTE — Assessment & Plan Note (Addendum)
Atypical chest pain 2 weeks ago in the center of her chest radiating to the right reproducible with palpation Hospital records reviewed with her in detail Likely musculoskeletal. No further workup at this time

## 2015-01-28 NOTE — Assessment & Plan Note (Signed)
Maintaining normal sinus rhythm. Tolerating anticoagulation. No medication changes made at this time

## 2015-01-28 NOTE — Progress Notes (Signed)
Patient ID: Gina Frazier, female    DOB: 1945/12/22, 69 y.o.   MRN: [id number]  HPI Comments: Gina Frazier is a 69 year old woman seen several years ago in 2012 for acute hypertension in the dentist chair, recently presenting to the hospital with new onset atrial fibrillation on 04/28/2014, started on Cardizem, Lopressor converting to normal sinus rhythm.. Prior episode 6 months prior. She was started on anticoagulation with eliquis 5 mg by mouth twice a day, also started on flecainide 50 mid grams twice a day, continued on beta blocker. She presents today for follow-up of her atrial fibrillation  In follow-up, she reports that she had right side chest pain radiating from her sternum underneath the right breast about 2 weeks ago She went to the emergency room at the suggestion of her daughter Workup was essentially normal, no ischemia suggested Since then she has not had any further episodes. She feels her symptoms may have been musculoskeletal, she was climbing a ladder repeatedly There was tenderness on palpation  Reports blood pressure typically runs high, 140s up to 123456 systolic on a regular basis Denies any tachycardia concerning for atrial fibrillation EKG on today's visit shows normal sinus rhythm with rate 61 bpm, Q waves in 1 and aVL  Other past medical history Echocardiogram from the hospital shows ejection fraction 60-65%, otherwise normal study    Allergies  Allergen Reactions  . Fish Allergy     REACTION: vomitting, swelling    Outpatient Encounter Prescriptions as of 01/28/2015  Medication Sig  . acetaminophen (TYLENOL) 500 MG tablet Take 1 tablet (500 mg total) by mouth every 6 (six) hours as needed for mild pain or moderate pain.  Marland Kitchen apixaban (ELIQUIS) 5 MG TABS tablet Take 1 tablet (5 mg total) by mouth 2 (two) times daily.  . Calcium Carbonate-Vitamin D (CALCIUM 600 + D PO) Take 1 tablet by mouth 2 (two) times daily.   . Cholecalciferol (VITAMIN D) 2000 UNITS  tablet Take 2,000 Units by mouth every morning.   . Diclofenac Sodium (PENNSAID TD) Place 1 application onto the skin 2 (two) times daily as needed (knee pain.).  Marland Kitchen flecainide (TAMBOCOR) 50 MG tablet Take 1 tablet (50 mg total) by mouth 2 (two) times daily.  . furosemide (LASIX) 20 MG tablet Take 1 tablet (20 mg total) by mouth daily.  Marland Kitchen glucosamine-chondroitin 500-400 MG tablet Take 2 tablets by mouth every morning.   Marland Kitchen HYDROcodone-acetaminophen (NORCO/VICODIN) 5-325 MG per tablet Take 1 tablet by mouth every 4 (four) hours as needed.  . Multiple Vitamins-Minerals (ALIVE WOMENS 50+ PO) Take 1 tablet by mouth every morning.  . nitroGLYCERIN (NITROSTAT) 0.4 MG SL tablet Place 1 tablet (0.4 mg total) under the tongue every 5 (five) minutes as needed for chest pain. Call 911 if pain not resolved after 3 pills  . OVER THE COUNTER MEDICATION Take 2 each by mouth every morning. Womens energy metabolism support chewable.  . potassium chloride (K-DUR) 10 MEQ tablet Take 1 tablet (10 mEq total) by mouth daily.  . propranolol (INDERAL) 20 MG tablet Take 1 tablet (20 mg total) by mouth 3 (three) times daily as needed.  . propranolol ER (INDERAL LA) 80 MG 24 hr capsule Take 1 capsule (80 mg total) by mouth daily.  . traZODone (DESYREL) 100 MG tablet Take 1 tablet (100 mg total) by mouth at bedtime as needed for sleep.  . [DISCONTINUED] apixaban (ELIQUIS) 5 MG TABS tablet Take 1 tablet (5 mg total) by mouth 2 (two) times daily.  . [  DISCONTINUED] flecainide (TAMBOCOR) 50 MG tablet Take 1 tablet (50 mg total) by mouth 2 (two) times daily.  . [DISCONTINUED] furosemide (LASIX) 20 MG tablet Take 1 tablet (20 mg total) by mouth daily.  . [DISCONTINUED] nitroGLYCERIN (NITROSTAT) 0.4 MG SL tablet Place 1 tablet (0.4 mg total) under the tongue every 5 (five) minutes as needed for chest pain.  . [DISCONTINUED] potassium chloride (K-DUR) 10 MEQ tablet Take 1 tablet (10 mEq total) by mouth daily.  . [DISCONTINUED]  propranolol ER (INDERAL LA) 80 MG 24 hr capsule Take 1 capsule (80 mg total) by mouth daily.  Marland Kitchen losartan (COZAAR) 50 MG tablet Take 1 tablet (50 mg total) by mouth daily.   No facility-administered encounter medications on file as of 01/28/2015.    Past Medical History  Diagnosis Date  . Hypertension   . Palpitations   . Osteoarthritis   . Chronic back pain   . Hip joint pain     left  . Anxiety   . Family history of adverse reaction to anesthesia     pts daughter had severe vomiting 2014   . Dysrhythmia     A Fib   . Wears dentures     partial upper  . Wears contact lenses     Past Surgical History  Procedure Laterality Date  . Cholecystectomy    . Tooth extraction    . Knee arthroscopy Left 06/28/2014    Procedure: ARTHROSCOPY KNEE WITH PARTIALLATERAL MENISECTOMY AND DEBRIDEMENT;  Surgeon: Susa Day, MD;  Location: [facility];  Service: Orthopedics;  Laterality: Left;  . Colonoscopy with propofol N/A 11/05/2014    Procedure: COLONOSCOPY WITH PROPOFOL;  Surgeon: Lucilla Lame, MD;  Location: [facility];  Service: Endoscopy;  Laterality: N/A;    Social History  reports that she has never smoked. She has never used smokeless tobacco. She reports that she does not drink alcohol or use illicit drugs.  Family History family history includes Arthritis in her daughter; Depression in her sister; Diabetes in her mother and sister; GER disease in her daughter; Heart disease in her brother, father, and mother; Hypertension in her brother, mother, and sister; Hypothyroidism in her daughter; Mental illness in her mother.   Review of Systems  Constitutional: Negative.   Respiratory: Negative.   Cardiovascular: Negative.   Gastrointestinal: Negative.   Musculoskeletal: Negative.   Skin: Negative.   Neurological: Negative.   Hematological: Negative.   Psychiatric/Behavioral: Negative.   All other systems reviewed and are negative.  BP 150/80 mmHg  Pulse 59  Ht 5\' 2"   (1.575 m)  Wt 166 lb 12 oz (75.637 kg)  BMI 30.49 kg/m2   Physical Exam  Constitutional: She is oriented to person, place, and time. She appears well-developed and well-nourished.  HENT:  Head: Normocephalic.  Nose: Nose normal.  Mouth/Throat: Oropharynx is clear and moist.  Eyes: Conjunctivae are normal. Pupils are equal, round, and reactive to light.  Neck: Normal range of motion. Neck supple. No JVD present.  Cardiovascular: Normal rate, regular rhythm, S1 normal, S2 normal, normal heart sounds and intact distal pulses.  Exam reveals no gallop and no friction rub.   No murmur heard. Pulmonary/Chest: Effort normal and breath sounds normal. No respiratory distress. She has no wheezes. She has no rales. She exhibits no tenderness.  Abdominal: Soft. Bowel sounds are normal. She exhibits no distension. There is no tenderness.  Musculoskeletal: Normal range of motion. She exhibits no edema or tenderness.  Lymphadenopathy:    She has no  cervical adenopathy.  Neurological: She is alert and oriented to person, place, and time. Coordination normal.  Skin: Skin is warm and dry. No rash noted. No erythema.  Psychiatric: She has a normal mood and affect. Her behavior is normal. Judgment and thought content normal.    Assessment and Plan  Nursing note and vitals reviewed.

## 2015-01-28 NOTE — Assessment & Plan Note (Signed)
Blood pressure continues to run high. Recommended she start losartan 50 mg daily Prescription sent in

## 2015-01-28 NOTE — Patient Instructions (Addendum)
You are doing well.  Please start losartan one a day for blood pressure  Take propranolol 20 mg as needed for palpitations  Please call us if you have new issues that need to be addressed before your next appt.  Your physician wants you to follow-up in: 6 months.  You will receive a reminder letter in the mail two months in advance. If you don't receive a letter, please call our office to schedule the follow-up appointment.   Therapist, music at [facility] [address], [place] Phone: [phone] Fax: [phone]   Providers Jayce G. Lacinda Axon, D.O.  Lorane Gell, AGNP-C  Einar Pheasant, MD  Tommi Rumps, MD  Deborra Medina, MD  Ronette Deter, MD  Palpitations A palpitation is the feeling that your heartbeat is irregular or is faster than normal. It may feel like your heart is fluttering or skipping a beat. Palpitations are usually not a serious problem. However, in some cases, you may need further medical evaluation. CAUSES  Palpitations can be caused by:  Smoking.  Caffeine or other stimulants, such as diet pills or energy drinks.  Alcohol.  Stress and anxiety.  Strenuous physical activity.  Fatigue.  Certain medicines.  Heart disease, especially if you have a history of irregular heart rhythms (arrhythmias), such as atrial fibrillation, atrial flutter, or supraventricular tachycardia.  An improperly working pacemaker or defibrillator. DIAGNOSIS  To find the cause of your palpitations, your health care provider will take your medical history and perform a physical exam. Your health care provider may also have you take a test called an ambulatory electrocardiogram (ECG). An ECG records your heartbeat patterns over a 24-hour period. You may also have other tests, such as:  Transthoracic echocardiogram (TTE). During echocardiography, sound waves are used to evaluate how blood flows through your  heart.  Transesophageal echocardiogram (TEE).  Cardiac monitoring. This allows your health care provider to monitor your heart rate and rhythm in real time.  Holter monitor. This is a portable device that records your heartbeat and can help diagnose heart arrhythmias. It allows your health care provider to track your heart activity for several days, if needed.  Stress tests by exercise or by giving medicine that makes the heart beat faster. TREATMENT  Treatment of palpitations depends on the cause of your symptoms and can vary greatly. Most cases of palpitations do not require any treatment other than time, relaxation, and monitoring your symptoms. Other causes, such as atrial fibrillation, atrial flutter, or supraventricular tachycardia, usually require further treatment. HOME CARE INSTRUCTIONS   Avoid:  Caffeinated coffee, tea, soft drinks, diet pills, and energy drinks.  Chocolate.  Alcohol.  Stop smoking if you smoke.  Reduce your stress and anxiety. Things that can help you relax include:  A method of controlling things in your body, such as your heartbeats, with your mind (biofeedback).  Yoga.  Meditation.  Physical activity such as swimming, jogging, or walking.  Get plenty of rest and sleep. SEEK MEDICAL CARE IF:   You continue to have a fast or irregular heartbeat beyond 24 hours.  Your palpitations occur more often. SEEK IMMEDIATE MEDICAL CARE IF:  You have chest pain or shortness of breath.  You have a severe headache.  You feel dizzy or you faint. MAKE SURE YOU:  Understand these instructions.  Will watch your condition.  Will get help right away if you are not doing well or get worse.   This information is not intended to replace  advice given to you by your health care provider. Make sure you discuss any questions you have with your health care provider.   Document Released: 02/28/2000 Document Revised: 03/07/2013 Document Reviewed:  05/01/2011 Elsevier Interactive Patient Education [employer].

## 2015-01-29 ENCOUNTER — Telehealth: Payer: Self-pay | Admitting: *Deleted

## 2015-01-29 NOTE — Telephone Encounter (Signed)
Patient was calling about her Mammogram appointment. Dr. Rockey Situ is referring her due to no pcp. Please call patient.

## 2015-01-29 NOTE — Telephone Encounter (Signed)
Pt was given #s for area PCPs to call and set up herself, as she was unsure of who she wanted to see.  Advised her that we cannot schedule her mammogram. She states that she called the fax # instead of the phone #. She will reattempt to schedule an appt w/ [facility].

## 2015-02-19 ENCOUNTER — Other Ambulatory Visit: Payer: Self-pay | Admitting: Family Medicine

## 2015-02-19 DIAGNOSIS — Z1231 Encounter for screening mammogram for malignant neoplasm of breast: Secondary | ICD-10-CM

## 2015-03-28 ENCOUNTER — Other Ambulatory Visit: Payer: Self-pay | Admitting: Orthopedic Surgery

## 2015-03-28 DIAGNOSIS — M1712 Unilateral primary osteoarthritis, left knee: Secondary | ICD-10-CM

## 2015-04-03 ENCOUNTER — Ambulatory Visit
Admission: RE | Admit: 2015-04-03 | Discharge: 2015-04-03 | Disposition: A | Discharge status: Home / Self Care | Payer: BLUE CROSS/BLUE SHIELD | Source: Ambulatory Visit | Attending: Orthopedic Surgery | Admitting: Orthopedic Surgery

## 2015-04-03 DIAGNOSIS — M1712 Unilateral primary osteoarthritis, left knee: Secondary | ICD-10-CM | POA: Diagnosis not present

## 2015-04-03 DIAGNOSIS — M21062 Valgus deformity, not elsewhere classified, left knee: Secondary | ICD-10-CM | POA: Insufficient documentation

## 2015-04-15 ENCOUNTER — Telehealth: Payer: Self-pay | Admitting: Cardiovascular Disease

## 2015-04-15 ENCOUNTER — Other Ambulatory Visit: Payer: Self-pay | Admitting: *Deleted

## 2015-04-15 MED ORDER — PROPRANOLOL HCL ER 80 MG PO CP24: 80.0000 mg | ORAL_CAPSULE | Freq: Every day | ORAL | Status: DC

## 2015-04-15 NOTE — Telephone Encounter (Signed)
Patient needs cardiac Clearance for L Knee Replacement.

## 2015-04-15 NOTE — Telephone Encounter (Signed)
°*  STAT* If patient is at the pharmacy, call can be transferred to refill team.   1. Which medications need to be refilled? (please list name of each medication and dose if known)    Propranolol  ER 80 mg po once daily   2. Which pharmacy/location (including street and city if local pharmacy) is medication to be sent to? [facility]   3. Do they need a 30 day or 90 day supply?  [place]

## 2015-04-15 NOTE — Telephone Encounter (Signed)
Pt sched for Left TKR on 05/02/15 w/ Dr. Rudene Christians.

## 2015-04-15 NOTE — Telephone Encounter (Signed)
Requested Prescriptions   Signed Prescriptions Disp Refills  . propranolol ER (INDERAL LA) 80 MG 24 hr capsule 90 capsule 3    Sig: Take 1 capsule (80 mg total) by mouth daily.    Authorizing Provider: Minna Merritts    Ordering User: Britt Bottom

## 2015-04-17 ENCOUNTER — Encounter
Admission: RE | Admit: 2015-04-17 | Discharge: 2015-04-17 | Disposition: A | Discharge status: Home / Self Care | Payer: BLUE CROSS/BLUE SHIELD | Source: Ambulatory Visit | Attending: Orthopedic Surgery | Admitting: Orthopedic Surgery

## 2015-04-17 DIAGNOSIS — Z01812 Encounter for preprocedural laboratory examination: Secondary | ICD-10-CM | POA: Diagnosis present

## 2015-04-17 LAB — BASIC METABOLIC PANEL
Anion gap: 7 (ref 5–15)
BUN: 26 mg/dL — AB (ref 6–20)
CHLORIDE: 101 mmol/L (ref 101–111)
CO2: 30 mmol/L (ref 22–32)
CREATININE: 0.71 mg/dL (ref 0.44–1.00)
Calcium: 9.4 mg/dL (ref 8.9–10.3)
GFR calc Af Amer: >60 mL/min (ref >60)
GFR calc non Af Amer: >60 mL/min (ref >60)
GLUCOSE: 89 mg/dL (ref 65–99)
POTASSIUM: 4 mmol/L (ref 3.5–5.1)
SODIUM: 138 mmol/L (ref 135–145)

## 2015-04-17 LAB — URINALYSIS COMPLETE WITH MICROSCOPIC (ARMC ONLY)
Bilirubin Urine: NEGATIVE
Glucose, UA: NEGATIVE mg/dL
KETONES UR: NEGATIVE mg/dL
Leukocytes, UA: NEGATIVE
NITRITE: NEGATIVE
PH: 5 (ref 5.0–8.0)
PROTEIN: NEGATIVE mg/dL
SPECIFIC GRAVITY, URINE: 1.014 (ref 1.005–1.030)

## 2015-04-17 LAB — CBC
HEMATOCRIT: 39.9 % (ref 35.0–47.0)
Hemoglobin: 13.4 g/dL (ref 12.0–16.0)
MCH: 29.9 pg (ref 26.0–34.0)
MCHC: 33.6 g/dL (ref 32.0–36.0)
MCV: 89 fL (ref 80.0–100.0)
Platelets: 224 10*3/uL (ref 150–440)
RBC: 4.49 MIL/uL (ref 3.80–5.20)
RDW: 13.3 % (ref 11.5–14.5)
WBC: 4.5 10*3/uL (ref 3.6–11.0)

## 2015-04-17 LAB — SEDIMENTATION RATE: SED RATE: 17 mm/h (ref 0–30)

## 2015-04-17 LAB — APTT: aPTT: 31 seconds (ref 24–36)

## 2015-04-17 LAB — TYPE AND SCREEN
ABO/RH(D): A POS
Antibody Screen: NEGATIVE

## 2015-04-17 LAB — SURGICAL PCR SCREEN
MRSA, PCR: NEGATIVE
Staphylococcus aureus: NEGATIVE

## 2015-04-17 LAB — ABO/RH: ABO/RH(D): A POS

## 2015-04-17 LAB — PROTIME-INR
INR: 1.2
PROTHROMBIN TIME: 15.4 s — AB (ref 11.4–15.0)

## 2015-04-17 NOTE — Telephone Encounter (Signed)
Acceptable risk for total knee replacement on the left Would stop anticoagulation, eliquis 3 days prior to the procedure Would restart several days after the procedure per the surgical team No further testing needed prior to surgery

## 2015-04-17 NOTE — Patient Instructions (Signed)
  Your procedure is scheduled on: 05/02/15 Thur Report to Day Surgery. To find out your arrival time please call [phone] between 1PM - 3PM on 05/01/15 Wed.  Remember: Instructions that are not followed completely may result in serious medical risk, up to and including death, or upon the discretion of your surgeon and anesthesiologist your surgery may need to be rescheduled.    _x___ 1. Do not eat food or drink liquids after midnight. No gum chewing or hard candies.     ____ 2. No Alcohol for 24 hours before or after surgery.   ____ 3. Bring all medications with you on the day of surgery if instructed.    _x___ 4. Notify your doctor if there is any change in your medical condition     (cold, fever, infections).     Do not wear jewelry, make-up, hairpins, clips or nail polish.  Do not wear lotions, powders, or perfumes. You may wear deodorant.  Do not shave 48 hours prior to surgery. Men may shave face and neck.  Do not bring valuables to the hospital.    [facility] is not responsible for any belongings or valuables.               Contacts, dentures or bridgework may not be worn into surgery.  Leave your suitcase in the car. After surgery it may be brought to your room.  For patients admitted to the hospital, discharge time is determined by your                treatment team.   Patients discharged the day of surgery will not be allowed to drive home.   Please read over the following fact sheets that you were given:   MRSA Information   _x___ Take these medicines the morning of surgery with A SIP OF WATER:    1. flecainide (TAMBOCOR) 50 MG tablet  2. losartan (COZAAR) 50 MG tablet  3. propranolol ER (INDERAL LA) 80 MG 24 hr capsule  4.  5.  6.  ____ Fleet Enema (as directed)   _x___ Use CHG Soap as directed  ____ Use inhalers on the day of surgery  ____ Stop metformin 2 days prior to surgery    ____ Take 1/2 of usual insulin dose the night before surgery and none on  the morning of surgery.   _x___ Stop Coumadin/Plavix/aspirin on Check with Dr Rockey Situ regarding stoppingEliquist 2 days before surgery  _x___ Stop Anti-inflammatories on No aspirin or ibuprofen 1 week before surgery   ____ Stop supplements until after surgery.    ____ Bring C-Pap to the hospital.

## 2015-04-18 LAB — URINE CULTURE: Culture: NO GROWTH

## 2015-04-18 NOTE — Telephone Encounter (Signed)
Routed to Dr. Rudene Christians.

## 2015-04-26 ENCOUNTER — Telehealth: Payer: Self-pay

## 2015-04-26 NOTE — Telephone Encounter (Signed)
Pt is having total knee replacement on 2/16, when does she need to stop her Eliquis. Please call and advise.

## 2015-04-30 NOTE — Telephone Encounter (Signed)
Patient stopped her eliquis yesterday,04/29/2015, in preparation for surgery

## 2015-05-02 ENCOUNTER — Encounter: Payer: Self-pay | Admitting: *Deleted

## 2015-05-02 ENCOUNTER — Inpatient Hospital Stay: Payer: BLUE CROSS/BLUE SHIELD | Admitting: Anesthesiology

## 2015-05-02 ENCOUNTER — Inpatient Hospital Stay: Payer: BLUE CROSS/BLUE SHIELD

## 2015-05-02 ENCOUNTER — Inpatient Hospital Stay
Admission: RE | Admit: 2015-05-02 | Discharge: 2015-05-05 | DRG: 470 | Disposition: A | Discharge status: Home / Self Care | Payer: BLUE CROSS/BLUE SHIELD | Source: Ambulatory Visit | Attending: Orthopedic Surgery | Admitting: Orthopedic Surgery

## 2015-05-02 ENCOUNTER — Encounter
Admission: RE | Disposition: A | Discharge status: Home / Self Care | Payer: Self-pay | Source: Ambulatory Visit | Attending: Orthopedic Surgery

## 2015-05-02 DIAGNOSIS — I4891 Unspecified atrial fibrillation: Secondary | ICD-10-CM | POA: Diagnosis present

## 2015-05-02 DIAGNOSIS — Z833 Family history of diabetes mellitus: Secondary | ICD-10-CM | POA: Diagnosis not present

## 2015-05-02 DIAGNOSIS — M1710 Unilateral primary osteoarthritis, unspecified knee: Secondary | ICD-10-CM | POA: Diagnosis present

## 2015-05-02 DIAGNOSIS — M1712 Unilateral primary osteoarthritis, left knee: Secondary | ICD-10-CM | POA: Diagnosis present

## 2015-05-02 DIAGNOSIS — I10 Essential (primary) hypertension: Secondary | ICD-10-CM | POA: Diagnosis present

## 2015-05-02 DIAGNOSIS — Z8249 Family history of ischemic heart disease and other diseases of the circulatory system: Secondary | ICD-10-CM | POA: Diagnosis not present

## 2015-05-02 DIAGNOSIS — G8918 Other acute postprocedural pain: Secondary | ICD-10-CM

## 2015-05-02 DIAGNOSIS — Z79899 Other long term (current) drug therapy: Secondary | ICD-10-CM | POA: Diagnosis not present

## 2015-05-02 HISTORY — PX: TOTAL KNEE ARTHROPLASTY: SHX125

## 2015-05-02 LAB — TYPE AND SCREEN
ABO/RH(D): A POS
Antibody Screen: NEGATIVE

## 2015-05-02 SURGERY — ARTHROPLASTY, KNEE, TOTAL
Anesthesia: Spinal | Laterality: Left | Wound class: Clean

## 2015-05-02 MED ORDER — PROPRANOLOL HCL ER 80 MG PO CP24
80.0000 mg | ORAL_CAPSULE | Freq: Every day | ORAL | Status: DC
  Administered 2015-05-03 – 2015-05-05 (×3): 80 mg via ORAL
  Filled 2015-05-02 (×4): qty 1

## 2015-05-02 MED ORDER — APIXABAN 5 MG PO TABS
5.0000 mg | ORAL_TABLET | Freq: Two times a day (BID) | ORAL | Status: DC
  Administered 2015-05-02 – 2015-05-05 (×6): 5 mg via ORAL
  Filled 2015-05-02 (×6): qty 1

## 2015-05-02 MED ORDER — SODIUM CHLORIDE 0.9 % IV SOLN
INTRAVENOUS | Status: DC
Start: 2015-05-02 — End: 2015-05-05
  Administered 2015-05-02 – 2015-05-03 (×2): via INTRAVENOUS

## 2015-05-02 MED ORDER — ONDANSETRON HCL 4 MG/2ML IJ SOLN: 4.0000 mg | Freq: Four times a day (QID) | INTRAMUSCULAR | Status: DC | PRN

## 2015-05-02 MED ORDER — SODIUM CHLORIDE 0.9 % IV SOLN
10000.0000 ug | INTRAVENOUS | Status: DC | PRN
  Administered 2015-05-02: 25 ug/min via INTRAVENOUS

## 2015-05-02 MED ORDER — TRAZODONE HCL 100 MG PO TABS
100.0000 mg | ORAL_TABLET | Freq: Every evening | ORAL | Status: DC | PRN
  Administered 2015-05-03: 100 mg via ORAL
  Filled 2015-05-02: qty 1

## 2015-05-02 MED ORDER — CEFAZOLIN SODIUM-DEXTROSE 2-3 GM-% IV SOLR
2.0000 g | Freq: Four times a day (QID) | INTRAVENOUS | Status: AC
  Administered 2015-05-02 – 2015-05-03 (×2): 2 g via INTRAVENOUS
  Filled 2015-05-02 (×3): qty 50

## 2015-05-02 MED ORDER — MAGNESIUM HYDROXIDE 400 MG/5ML PO SUSP
30.0000 mL | Freq: Every day | ORAL | Status: DC | PRN
  Administered 2015-05-03 – 2015-05-04 (×2): 30 mL via ORAL
  Filled 2015-05-02 (×2): qty 30

## 2015-05-02 MED ORDER — VITAMIN D (ERGOCALCIFEROL) 1.25 MG (50000 UNIT) PO CAPS
50000.0000 [IU] | ORAL_CAPSULE | ORAL | Status: DC
  Administered 2015-05-02: 50000 [IU] via ORAL
  Filled 2015-05-02: qty 1

## 2015-05-02 MED ORDER — FENTANYL CITRATE (PF) 100 MCG/2ML IJ SOLN
INTRAMUSCULAR | Status: DC | PRN
  Administered 2015-05-02: 25 ug via INTRAVENOUS
  Administered 2015-05-02: 50 ug via INTRAVENOUS
  Administered 2015-05-02: 25 ug via INTRAVENOUS

## 2015-05-02 MED ORDER — METOCLOPRAMIDE HCL 5 MG PO TABS: 5.0000 mg | ORAL_TABLET | Freq: Three times a day (TID) | ORAL | Status: DC | PRN

## 2015-05-02 MED ORDER — BUPIVACAINE LIPOSOME 1.3 % IJ SUSP
INTRAMUSCULAR | Status: AC
  Filled 2015-05-02: qty 20

## 2015-05-02 MED ORDER — FENTANYL CITRATE (PF) 100 MCG/2ML IJ SOLN: 25.0000 ug | INTRAMUSCULAR | Status: DC | PRN

## 2015-05-02 MED ORDER — GLYCOPYRROLATE 0.2 MG/ML IJ SOLN
INTRAMUSCULAR | Status: DC | PRN
  Administered 2015-05-02: 0.2 mg via INTRAVENOUS

## 2015-05-02 MED ORDER — ONDANSETRON HCL 4 MG PO TABS
4.0000 mg | ORAL_TABLET | Freq: Four times a day (QID) | ORAL | Status: DC | PRN
  Administered 2015-05-04: 4 mg via ORAL
  Filled 2015-05-02: qty 1

## 2015-05-02 MED ORDER — MENTHOL 3 MG MT LOZG: 1.0000 | LOZENGE | OROMUCOSAL | Status: DC | PRN

## 2015-05-02 MED ORDER — MORPHINE SULFATE (PF) 10 MG/ML IV SOLN
INTRAVENOUS | Status: AC
  Filled 2015-05-02: qty 1

## 2015-05-02 MED ORDER — NEOMYCIN-POLYMYXIN B GU 40-200000 IR SOLN
Status: AC
  Filled 2015-05-02: qty 20

## 2015-05-02 MED ORDER — FAMOTIDINE 20 MG PO TABS
20.0000 mg | ORAL_TABLET | Freq: Once | ORAL | Status: AC
  Administered 2015-05-02: 20 mg via ORAL

## 2015-05-02 MED ORDER — ZOLPIDEM TARTRATE 5 MG PO TABS: 5.0000 mg | ORAL_TABLET | Freq: Every evening | ORAL | Status: DC | PRN

## 2015-05-02 MED ORDER — SODIUM CHLORIDE 0.9 % IJ SOLN
INTRAMUSCULAR | Status: AC
  Filled 2015-05-02: qty 50

## 2015-05-02 MED ORDER — ACETAMINOPHEN 325 MG PO TABS
650.0000 mg | ORAL_TABLET | Freq: Four times a day (QID) | ORAL | Status: DC | PRN
  Administered 2015-05-05: 650 mg via ORAL
  Filled 2015-05-02: qty 2

## 2015-05-02 MED ORDER — EPHEDRINE SULFATE 50 MG/ML IJ SOLN
INTRAMUSCULAR | Status: DC | PRN
  Administered 2015-05-02: 5 mg via INTRAVENOUS
  Administered 2015-05-02: 10 mg via INTRAVENOUS
  Administered 2015-05-02: 5 mg via INTRAVENOUS

## 2015-05-02 MED ORDER — MORPHINE SULFATE (PF) 2 MG/ML IV SOLN
2.0000 mg | INTRAVENOUS | Status: DC | PRN
  Administered 2015-05-02 – 2015-05-03 (×7): 2 mg via INTRAVENOUS
  Filled 2015-05-02 (×7): qty 1

## 2015-05-02 MED ORDER — MAGNESIUM CITRATE PO SOLN: 1.0000 | Freq: Once | ORAL | Status: DC | PRN

## 2015-05-02 MED ORDER — MIDAZOLAM HCL 2 MG/2ML IJ SOLN
INTRAMUSCULAR | Status: AC
  Administered 2015-05-02: 1 mg via INTRAVENOUS
  Filled 2015-05-02: qty 2

## 2015-05-02 MED ORDER — SODIUM CHLORIDE 0.9 % IJ SOLN: INTRAMUSCULAR | Status: DC | PRN

## 2015-05-02 MED ORDER — BUPIVACAINE HCL (PF) 0.5 % IJ SOLN
INTRAMUSCULAR | Status: DC | PRN
  Administered 2015-05-02: 2.8 mL via PERINEURAL

## 2015-05-02 MED ORDER — ACETAMINOPHEN 650 MG RE SUPP: 650.0000 mg | Freq: Four times a day (QID) | RECTAL | Status: DC | PRN

## 2015-05-02 MED ORDER — POTASSIUM CHLORIDE ER 10 MEQ PO TBCR
10.0000 meq | EXTENDED_RELEASE_TABLET | Freq: Every day | ORAL | Status: DC
  Administered 2015-05-02 – 2015-05-05 (×4): 10 meq via ORAL
  Filled 2015-05-02 (×8): qty 1

## 2015-05-02 MED ORDER — METHOCARBAMOL 1000 MG/10ML IJ SOLN
500.0000 mg | Freq: Four times a day (QID) | INTRAVENOUS | Status: DC | PRN
  Filled 2015-05-02: qty 5

## 2015-05-02 MED ORDER — MENTHOL 3 MG MT LOZG
1.0000 | LOZENGE | OROMUCOSAL | Status: DC | PRN
  Administered 2015-05-02: 3 mg via ORAL
  Filled 2015-05-02: qty 9

## 2015-05-02 MED ORDER — BISACODYL 5 MG PO TBEC: 5.0000 mg | DELAYED_RELEASE_TABLET | Freq: Every day | ORAL | Status: DC | PRN

## 2015-05-02 MED ORDER — LACTATED RINGERS IV SOLN
INTRAVENOUS | Status: DC
  Administered 2015-05-02: 1 mL via INTRAVENOUS
  Administered 2015-05-02: 09:00:00 via INTRAVENOUS

## 2015-05-02 MED ORDER — ONDANSETRON HCL 4 MG/2ML IJ SOLN
4.0000 mg | Freq: Once | INTRAMUSCULAR | Status: AC | PRN
  Administered 2015-05-02: 4 mg via INTRAVENOUS

## 2015-05-02 MED ORDER — NITROGLYCERIN 0.4 MG SL SUBL: 0.4000 mg | SUBLINGUAL_TABLET | SUBLINGUAL | Status: DC | PRN

## 2015-05-02 MED ORDER — LOSARTAN POTASSIUM 50 MG PO TABS
50.0000 mg | ORAL_TABLET | Freq: Every day | ORAL | Status: DC
  Administered 2015-05-03 – 2015-05-05 (×3): 50 mg via ORAL
  Filled 2015-05-02 (×3): qty 1

## 2015-05-02 MED ORDER — CEFAZOLIN SODIUM-DEXTROSE 2-3 GM-% IV SOLR
2.0000 g | Freq: Once | INTRAVENOUS | Status: AC
  Administered 2015-05-02: 2 g via INTRAVENOUS

## 2015-05-02 MED ORDER — NEOMYCIN-POLYMYXIN B GU 40-200000 IR SOLN
Status: DC | PRN
  Administered 2015-05-02: 16 mL

## 2015-05-02 MED ORDER — SODIUM CHLORIDE 0.9 % IV SOLN
INTRAVENOUS | Status: DC | PRN
  Administered 2015-05-02: 60 mL

## 2015-05-02 MED ORDER — MORPHINE SULFATE 10 MG/ML IJ SOLN
INTRAMUSCULAR | Status: DC | PRN
  Administered 2015-05-02: 10 mg via INTRAMUSCULAR

## 2015-05-02 MED ORDER — KETAMINE HCL 10 MG/ML IJ SOLN
INTRAMUSCULAR | Status: DC | PRN
  Administered 2015-05-02: 25 mg via INTRAVENOUS

## 2015-05-02 MED ORDER — OXYCODONE HCL 5 MG PO TABS
5.0000 mg | ORAL_TABLET | ORAL | Status: DC | PRN
  Administered 2015-05-02: 10 mg via ORAL
  Administered 2015-05-02 (×2): 5 mg via ORAL
  Administered 2015-05-02 – 2015-05-03 (×3): 10 mg via ORAL
  Administered 2015-05-04 (×3): 5 mg via ORAL
  Filled 2015-05-02: qty 1
  Filled 2015-05-02: qty 2
  Filled 2015-05-02: qty 1
  Filled 2015-05-02: qty 2
  Filled 2015-05-02: qty 10
  Filled 2015-05-02: qty 2
  Filled 2015-05-02: qty 1
  Filled 2015-05-02: qty 2
  Filled 2015-05-02 (×2): qty 1

## 2015-05-02 MED ORDER — BUPIVACAINE-EPINEPHRINE (PF) 0.25% -1:200000 IJ SOLN
INTRAMUSCULAR | Status: AC
  Filled 2015-05-02: qty 30

## 2015-05-02 MED ORDER — METOCLOPRAMIDE HCL 5 MG/ML IJ SOLN: 5.0000 mg | Freq: Three times a day (TID) | INTRAMUSCULAR | Status: DC | PRN

## 2015-05-02 MED ORDER — BUPIVACAINE-EPINEPHRINE (PF) 0.25% -1:200000 IJ SOLN
INTRAMUSCULAR | Status: DC | PRN
  Administered 2015-05-02: 30 mL via PERINEURAL

## 2015-05-02 MED ORDER — DOCUSATE SODIUM 100 MG PO CAPS
100.0000 mg | ORAL_CAPSULE | Freq: Two times a day (BID) | ORAL | Status: DC
  Administered 2015-05-02 – 2015-05-05 (×6): 100 mg via ORAL
  Filled 2015-05-02 (×6): qty 1

## 2015-05-02 MED ORDER — PHENOL 1.4 % MT LIQD
1.0000 | OROMUCOSAL | Status: DC | PRN
  Filled 2015-05-02: qty 177

## 2015-05-02 MED ORDER — METHOCARBAMOL 500 MG PO TABS: 500.0000 mg | ORAL_TABLET | Freq: Four times a day (QID) | ORAL | Status: DC | PRN

## 2015-05-02 MED ORDER — FLECAINIDE ACETATE 50 MG PO TABS
50.0000 mg | ORAL_TABLET | Freq: Two times a day (BID) | ORAL | Status: DC
  Administered 2015-05-02 – 2015-05-05 (×5): 50 mg via ORAL
  Filled 2015-05-02 (×8): qty 1

## 2015-05-02 MED ORDER — ONDANSETRON HCL 4 MG/2ML IJ SOLN
INTRAMUSCULAR | Status: AC
  Administered 2015-05-02: 4 mg via INTRAVENOUS
  Filled 2015-05-02: qty 2

## 2015-05-02 MED ORDER — PROPOFOL 500 MG/50ML IV EMUL
INTRAVENOUS | Status: DC | PRN
  Administered 2015-05-02: 50 ug/kg/min via INTRAVENOUS

## 2015-05-02 SURGICAL SUPPLY — 55 items
ADAPTER IRRIG TUBE 2 SPIKE SOL (ADAPTER) ×3 IMPLANT
BANDAGE ACE 6X5 VEL STRL LF (GAUZE/BANDAGES/DRESSINGS) ×3 IMPLANT
BLADE SAW 1 (BLADE) ×3 IMPLANT
BLOCK CUTTING FEMUR 3 LT MED (MISCELLANEOUS) IMPLANT
BLOCK CUTTING FEMUR 4 LT MED (MISCELLANEOUS) IMPLANT
CANISTER SUCT 1200ML W/VALVE (MISCELLANEOUS) ×3 IMPLANT
CANISTER SUCT 3000ML (MISCELLANEOUS) ×6 IMPLANT
CAPT KNEE TOTAL 3 ×3 IMPLANT
CATH FOL LEG HOLDER (MISCELLANEOUS) ×3 IMPLANT
CATH TRAY METER 16FR LF (MISCELLANEOUS) ×3 IMPLANT
CEMENT HV SMART SET (Cement) ×6 IMPLANT
CHLORAPREP W/TINT 26ML (MISCELLANEOUS) ×6 IMPLANT
COOLER POLAR GLACIER W/PUMP (MISCELLANEOUS) ×3 IMPLANT
DRAPE INCISE IOBAN 66X45 STRL (DRAPES) ×3 IMPLANT
DRAPE SHEET LG 3/4 BI-LAMINATE (DRAPES) ×6 IMPLANT
ELECT CAUTERY BLADE 6.4 (BLADE) ×3 IMPLANT
ELECT REM PT RETURN 9FT ADLT (ELECTROSURGICAL) ×3
ELECTRODE REM PT RTRN 9FT ADLT (ELECTROSURGICAL) ×1 IMPLANT
GAUZE PETRO XEROFOAM 1X8 (MISCELLANEOUS) ×3 IMPLANT
GAUZE SPONGE 4X4 12PLY STRL (GAUZE/BANDAGES/DRESSINGS) ×3 IMPLANT
GLOVE BIOGEL PI IND STRL 9 (GLOVE) ×1 IMPLANT
GLOVE BIOGEL PI INDICATOR 9 (GLOVE) ×2
GLOVE SURG ORTHO 9.0 STRL STRW (GLOVE) ×3 IMPLANT
GOWN SPECIALTY ULTRA XL (MISCELLANEOUS) IMPLANT
GOWN STRL REUS W/ TWL LRG LVL3 (GOWN DISPOSABLE) ×2 IMPLANT
GOWN STRL REUS W/TWL LRG LVL3 (GOWN DISPOSABLE) ×4
HANDPIECE SUCTION TUBG SURGILV (MISCELLANEOUS) ×3 IMPLANT
HOOD PEEL AWAY FLYTE STAYCOOL (MISCELLANEOUS) ×6 IMPLANT
IMMBOLIZER KNEE 19 BLUE UNIV (SOFTGOODS) IMPLANT
IV SET EXTENSION 6 LL TADAPT (SET/KITS/TRAYS/PACK) IMPLANT
KNEE MEDACTA TIBIAL/FEMORAL BL (Knees) ×3 IMPLANT
KNIFE SCULPS 14X20 (INSTRUMENTS) ×3 IMPLANT
MEDACTA IMPLANT
NDL SAFETY 18GX1.5 (NEEDLE) ×3 IMPLANT
NEEDLE SPNL 18GX3.5 QUINCKE PK (NEEDLE) ×3 IMPLANT
NEEDLE SPNL 20GX3.5 QUINCKE YW (NEEDLE) ×3 IMPLANT
NS IRRIG 1000ML POUR BTL (IV SOLUTION) ×3 IMPLANT
PACK TOTAL KNEE (MISCELLANEOUS) ×3 IMPLANT
PAD WRAPON POLAR KNEE (MISCELLANEOUS) ×1 IMPLANT
SOL .9 NS 3000ML IRR  AL (IV SOLUTION)
SOL .9 NS 3000ML IRR UROMATIC (IV SOLUTION) IMPLANT
STAPLER SKIN PROX 35W (STAPLE) ×3 IMPLANT
STRAP SAFETY BODY (MISCELLANEOUS) ×3 IMPLANT
SUCTION FRAZIER HANDLE 10FR (MISCELLANEOUS) ×2
SUCTION TUBE FRAZIER 10FR DISP (MISCELLANEOUS) ×1 IMPLANT
SUT DVC 2 QUILL PDO  T11 36X36 (SUTURE) ×2
SUT DVC 2 QUILL PDO T11 36X36 (SUTURE) ×1 IMPLANT
SUT DVC QUILL MONODERM 30X30 (SUTURE) ×3 IMPLANT
SUT ETHIBOND NAB CT1 #1 30IN (SUTURE) IMPLANT
SYR 20CC LL (SYRINGE) ×3 IMPLANT
SYR 50ML LL SCALE MARK (SYRINGE) ×3 IMPLANT
TIBIAL BONE MODEL LEFT (MISCELLANEOUS) IMPLANT
TOWER CARTRIDGE SMART MIX (DISPOSABLE) ×3 IMPLANT
WATER STERILE IRR 1000ML POUR (IV SOLUTION) IMPLANT
WRAPON POLAR PAD KNEE (MISCELLANEOUS) ×3

## 2015-05-02 NOTE — Anesthesia Postprocedure Evaluation (Signed)
Anesthesia Post Note  Patient: Shervon Segers  Procedure(s) Performed: Procedure(s) (LRB): TOTAL KNEE ARTHROPLASTY (Left)  Patient location during evaluation: PACU Anesthesia Type: Regional and Spinal Level of consciousness: awake Pain management: pain level controlled Respiratory status: respiratory function stable Cardiovascular status: stable Anesthetic complications: no    Last Vitals:  Filed Vitals:   05/02/15 1034 05/02/15 1057  BP: 116/68 138/54  Pulse: 60 65  Temp: 36.8 C 36.4 C  Resp: 15 18    Last Pain:  Filed Vitals:   05/02/15 1107  PainSc: 6                  VAN STAVEREN,Nevaeh Korte

## 2015-05-02 NOTE — Op Note (Signed)
05/02/2015  9:48 AM  PATIENT:  Gina Frazier  70 y.o. female  PRE-OPERATIVE DIAGNOSIS:  primary osteoarthritis of left knee  POST-OPERATIVE DIAGNOSIS:  primary osteoarthritis of left knee  PROCEDURE:  Procedure(s): TOTAL KNEE ARTHROPLASTY (Left)  SURGEON: Laurene Footman, MD  ASSISTANTS: Rachelle Hora [facility]  ANESTHESIA:   spinal  EBL:  Total I/O In: 1300 [I.V.:1300] Out: 300 [Urine:250; Blood:50]  BLOOD ADMINISTERED:none  DRAINS: none   LOCAL MEDICATIONS USED:  MARCAINE    and OTHER morphine and Exparel  SPECIMEN:  Source of Specimen:  Cut ends of bone  DISPOSITION OF SPECIMEN:  PATHOLOGY  COUNTS:  YES  TOURNIQUET:   Total Tourniquet Time Documented: Thigh (Left) - 91 minutes Total: Thigh (Left) - 91 minutes   IMPLANTS: Medacta GMK primary total knee system left for and PS cemented femoral component, 3 left  tibial component with 12 mm PS insert and size 2 patella  DICTATION: .Dragon Dictation patient brought the operating room and after adequate anesthesia was obtained the left leg was prepped and draped in sterile fashion. After patient identification and timeout procedures were completed tourniquet was raised to 3 mmHg. A midline skin incision was made followed by the lateral parapatellar approach. Inspection of the joint revealed eburnated bone in the lateral compartment and lateral facet of the patella. Anterior cruciate ligament and fat pad were excised and the patella retracted medially the IT band injuries tubercle was elevated with use of an osteotome to crack the August fixed deformity. Proximal tibia cut was carried out using the [place] cutting guide and was subsequently recut to allow for better soft tissue balance. The femoral cutting guide was applied distal femoral cut made and then the 4 narrow cutting block applied anterior posterior and chamfer cuts made with the PCL excised. The trochlear groove cut and PCL groove cut were then carried out as well with good  visualization of the tibia the proximal tibia preparation was carried out with a size 3 gave good coverage and proximal drill hole made keel punch placed with a 12 mm insert there was full extension and stability in flexion and extension mid flexion. Patella was cut using the patellar cutting guide drill holes made and it sized to size 2. This point the level above noted local anesthetic was infiltrated in the para-articular tissues. The bony surfaces were thoroughly irrigated and dried. Tibial component was cemented in place first 5 by the femoral component a 12 mm insert knee is held in extension as the cement set off clamping the patella button in place with cement. After the cement set excess cement was removed and the knee again irrigated the 12 mm PS insert was snapped into place and locking screw tightened with torque screwdriver. The patella tracked well with no touch technique and full extension flexion  To 110 obtained. Thrombin was repaired using a heavy Quill repairing the capsule next to the tendon and bone to the synovium laterally since lateral release was required because the severe valgus. To a Quill subcutaneously and skin staples. Xeroform 4 x 4's ABDs and web roll Polar Care and Ace wrap applied,  no knee immobilizer to prevent peroneal nerve injury postop.  PLAN OF CARE: Admit to inpatient   PATIENT DISPOSITION:  PACU - hemodynamically stable.

## 2015-05-02 NOTE — NC FL2 (Signed)
[place] LEVEL OF CARE SCREENING TOOL     IDENTIFICATION  Patient Name: Gina Frazier Birthdate: 24-Dec-1945 Sex: female Admission Date (Current Location): 05/02/2015  [place] and [place] Number:  Engineering geologist and Address:  [facility], [address] [place], [place] [address]      Provider Number: [id number]  Attending Physician Name and Address:  Hessie Knows, MD  Relative Name and Phone Number:       Current Level of Care: Hospital Recommended Level of Care: [place] Prior Approval Number:    Date Approved/Denied:   PASRR Number:  ([id number])  Discharge Plan: SNF    Current Diagnoses: Patient Active Problem List   Diagnosis Date Noted  . Primary osteoarthritis of knee 05/02/2015  . Chest pain 01/28/2015  . Special screening for malignant neoplasms, colon   . Atrial fibrillation ([place]) 05/01/2014  . Snoring 05/01/2014  . Anxiety attack 12/30/2010  . Visit for preventive health examination 12/30/2010  . Hypertension 07/21/2010  . Chronic back pain 07/21/2010  . Vitamin D deficiency 07/21/2010  . SHORTNESS OF BREATH 05/01/2010  . Essential hypertension 04/18/2010  . OSTEOARTHROS UNSPEC GEN/LOC OTH SPEC SITES 04/18/2010  . KNEE PAIN, LEFT 04/18/2010      Orientation RESPIRATION BLADDER Height & Weight     Self, Time, Situation, Place  Normal Continent Weight:   Height:     BEHAVIORAL SYMPTOMS/MOOD NEUROLOGICAL BOWEL NUTRITION STATUS   (none )  (none) Continent Diet (Diet: Clear Liquid )  AMBULATORY STATUS COMMUNICATION OF NEEDS Skin   Extensive Assist Verbally Surgical wounds (Incision: Left Knee )                       Personal Care Assistance Level of Assistance  Bathing, Feeding, Dressing Bathing Assistance: Limited assistance Feeding assistance: Independent Dressing Assistance: Limited assistance     Functional Limitations Info  Sight, Hearing, Speech Sight Info:  Adequate Hearing Info: Adequate Speech Info: Adequate    SPECIAL CARE FACTORS FREQUENCY  PT (By licensed PT), OT (By licensed OT)     PT Frequency:  (5) OT Frequency:  (5)            Contractures      Additional Factors Info  Code Status, Allergies Code Status Info:  (Full Code. ) Allergies Info:  (Fish Allergy)           Current Medications (05/02/2015):  This is the current hospital active medication list Current Facility-Administered Medications  Medication Dose Route Frequency Provider Last Rate Last Dose  . 0.9 %  sodium chloride infusion   Intravenous Continuous Hessie Knows, MD 75 mL/hr at 05/02/15 1130    . acetaminophen (TYLENOL) tablet 650 mg  650 mg Oral Q6H PRN Hessie Knows, MD       Or  . acetaminophen (TYLENOL) suppository 650 mg  650 mg Rectal Q6H PRN Hessie Knows, MD      . apixaban Arne Cleveland) tablet 5 mg  5 mg Oral BID Hessie Knows, MD   5 mg at 05/02/15 1115  . bisacodyl (DULCOLAX) EC tablet 5 mg  5 mg Oral Daily PRN Hessie Knows, MD      . ceFAZolin (ANCEF) IVPB 2 g/50 mL premix  2 g Intravenous Q6H Hessie Knows, MD      . docusate sodium (COLACE) capsule 100 mg  100 mg Oral BID Hessie Knows, MD   100 mg at 05/02/15 1139  . flecainide (TAMBOCOR) tablet 50 mg  50 mg Oral BID Hessie Knows, MD   50 mg at 05/02/15 1139  . losartan (COZAAR) tablet 50 mg  50 mg Oral Daily Hessie Knows, MD   50 mg at 05/02/15 1139  . magnesium citrate solution 1 Bottle  1 Bottle Oral Once PRN Hessie Knows, MD      . magnesium hydroxide (MILK OF MAGNESIA) suspension 30 mL  30 mL Oral Daily PRN Hessie Knows, MD      . menthol-cetylpyridinium (CEPACOL) lozenge 3 mg  1 lozenge Oral PRN Hessie Knows, MD   3 mg at 05/02/15 1156   Or  . phenol (CHLORASEPTIC) mouth spray 1 spray  1 spray Mouth/Throat PRN Hessie Knows, MD      . methocarbamol (ROBAXIN) tablet 500 mg  500 mg Oral Q6H PRN Hessie Knows, MD       Or  . methocarbamol (ROBAXIN) 500 mg in dextrose 5 % 50 mL IVPB  500 mg  Intravenous Q6H PRN Hessie Knows, MD      . metoCLOPramide (REGLAN) tablet 5-10 mg  5-10 mg Oral Q8H PRN Hessie Knows, MD       Or  . metoCLOPramide (REGLAN) injection 5-10 mg  5-10 mg Intravenous Q8H PRN Hessie Knows, MD      . morphine 2 MG/ML injection 2 mg  2 mg Intravenous Q1H PRN Hessie Knows, MD   2 mg at 05/02/15 1303  . nitroGLYCERIN (NITROSTAT) SL tablet 0.4 mg  0.4 mg Sublingual Q5 min PRN Hessie Knows, MD      . ondansetron [facility]) tablet 4 mg  4 mg Oral Q6H PRN Hessie Knows, MD       Or  . ondansetron [facility]) injection 4 mg  4 mg Intravenous Q6H PRN Hessie Knows, MD      . oxyCODONE (Oxy IR/ROXICODONE) immediate release tablet 5-10 mg  5-10 mg Oral Q3H PRN Hessie Knows, MD   5 mg at 05/02/15 1337  . potassium chloride (K-DUR) CR tablet 10 mEq  10 mEq Oral Daily Hessie Knows, MD      . propranolol ER (INDERAL LA) 24 hr capsule 80 mg  80 mg Oral Daily Hessie Knows, MD   80 mg at 05/02/15 1140  . traZODone (DESYREL) tablet 100 mg  100 mg Oral QHS PRN Hessie Knows, MD      . Vitamin D (Ergocalciferol) (DRISDOL) capsule 50,000 Units  50,000 Units Oral Weekly Hessie Knows, MD      . zolpidem [facility]) tablet 5 mg  5 mg Oral QHS PRN Hessie Knows, MD         Discharge Medications: Please see discharge summary for a list of discharge medications.  Relevant Imaging Results:  Relevant Lab Results:   Additional Information  (SSN: [id number])  Loralyn Freshwater, LCSW

## 2015-05-02 NOTE — H&P (Signed)
Reviewed paper H+P, will be scanned into chart. No changes noted.  

## 2015-05-02 NOTE — Anesthesia Preprocedure Evaluation (Signed)
Anesthesia Evaluation  Patient identified by MRN, date of birth, ID band Patient awake    Reviewed: Allergy & Precautions, NPO status , Patient's Chart, lab work & pertinent test results  History of Anesthesia Complications Negative for: history of anesthetic complications  Airway Mallampati: II       Dental  (+) Partial Upper   Pulmonary neg pulmonary ROS,    breath sounds clear to auscultation       Cardiovascular Exercise Tolerance: Good hypertension, Pt. on medications + dysrhythmias Atrial Fibrillation  Rhythm:Regular Rate:Normal     Neuro/Psych negative neurological ROS  negative psych ROS   GI/Hepatic negative GI ROS, Neg liver ROS,   Endo/Other  negative endocrine ROS  Renal/GU negative Renal ROS     Musculoskeletal negative musculoskeletal ROS (+)   Abdominal Normal abdominal exam  (+)   Peds  Hematology negative hematology ROS (+)   Anesthesia Other Findings   Reproductive/Obstetrics                             Anesthesia Physical Anesthesia Plan  ASA: II  Anesthesia Plan: Spinal   Post-op Pain Management: MAC Combined w/ Regional for Post-op pain   Induction:   Airway Management Planned: Natural Airway and Nasal Cannula  Additional Equipment:   Intra-op Plan:   Post-operative Plan:   Informed Consent: I have reviewed the patients History and Physical, chart, labs and discussed the procedure including the risks, benefits and alternatives for the proposed anesthesia with the patient or authorized representative who has indicated his/her understanding and acceptance.     Plan Discussed with: CRNA  Anesthesia Plan Comments:         Anesthesia Quick Evaluation

## 2015-05-02 NOTE — Anesthesia Procedure Notes (Addendum)
Spinal Patient location during procedure: OR Start time: 05/02/2015 7:20 AM End time: 05/02/2015 7:31 AM Reason for block: at surgeon's request Staffing Anesthesiologist: Marline Backbone F Performed by: anesthesiologist  Preanesthetic Checklist Completed: patient identified, site marked, surgical consent, pre-op evaluation, timeout performed, IV checked, risks and benefits discussed, monitors and equipment checked and at surgeon's request Spinal Block Patient position: sitting Prep: Betadine Patient monitoring: heart rate and blood pressure Approach: midline Location: L3-4 Injection technique: single-shot Needle Needle type: Quincke  Needle gauge: 22 G Needle length: 9 cm Needle insertion depth: 6 cm Assessment Events: Para Median Right  Procedure Name: MAC Performed by: Demetrius Charity Pre-anesthesia Checklist: Emergency Drugs available, Patient identified, Patient being monitored, Suction available and Timeout performed Oxygen Delivery Method: Simple face mask

## 2015-05-02 NOTE — Transfer of Care (Signed)
Immediate Anesthesia Transfer of Care Note  Patient: Gina Frazier  Procedure(s) Performed: Procedure(s): TOTAL KNEE ARTHROPLASTY (Left)  Patient Location: PACU  Anesthesia Type:Spinal  Level of Consciousness: awake, alert  and oriented  Airway & Oxygen Therapy: Patient Spontanous Breathing and Patient connected to face mask oxygen  Post-op Assessment: Report given to RN and Post -op Vital signs reviewed and stable  Post vital signs: Reviewed and stable  Last Vitals:  Filed Vitals:   05/02/15 0611 05/02/15 0949  BP: 156/78 140/65  Pulse: 71 63  Temp: 36.7 C 36.7 C  Resp: 16 16    Complications: No apparent anesthesia complications

## 2015-05-03 ENCOUNTER — Encounter: Payer: Self-pay | Admitting: Orthopedic Surgery

## 2015-05-03 LAB — BASIC METABOLIC PANEL
Anion gap: 5 (ref 5–15)
BUN: 16 mg/dL (ref 6–20)
CALCIUM: 8.4 mg/dL — AB (ref 8.9–10.3)
CHLORIDE: 103 mmol/L (ref 101–111)
CO2: 30 mmol/L (ref 22–32)
CREATININE: 0.68 mg/dL (ref 0.44–1.00)
GFR calc non Af Amer: >60 mL/min (ref >60)
GLUCOSE: 119 mg/dL — AB (ref 65–99)
Potassium: 4.3 mmol/L (ref 3.5–5.1)
Sodium: 138 mmol/L (ref 135–145)

## 2015-05-03 LAB — CBC
HEMATOCRIT: 32.3 % — AB (ref 35.0–47.0)
HEMOGLOBIN: 10.7 g/dL — AB (ref 12.0–16.0)
MCH: 30.2 pg (ref 26.0–34.0)
MCHC: 33 g/dL (ref 32.0–36.0)
MCV: 91.3 fL (ref 80.0–100.0)
Platelets: 167 10*3/uL (ref 150–440)
RBC: 3.54 MIL/uL — ABNORMAL LOW (ref 3.80–5.20)
RDW: 13.5 % (ref 11.5–14.5)
WBC: 7.3 10*3/uL (ref 3.6–11.0)

## 2015-05-03 MED ORDER — TRAMADOL HCL 50 MG PO TABS
50.0000 mg | ORAL_TABLET | Freq: Four times a day (QID) | ORAL | Status: DC | PRN
  Administered 2015-05-03: 50 mg via ORAL
  Filled 2015-05-03: qty 1

## 2015-05-03 NOTE — Progress Notes (Signed)
Physical Therapy Treatment Patient Details Name: Gina Frazier MRN: [id number] DOB: [birth year] Today's Date: 05/03/2015    History of Present Illness This patient is a 70 year old female who came to [facility] for a L TKR.    PT Comments    Pt shows good effort with all activities but continues to have significant pain, even at rest.  She is able to increase mobility and ambulation needing little to no direct assist but is slow and guarded with them all.  Her biggest issue at this point remains her lack of terminal knee extension both with exercises and during ambulation.  She is able to increase ambulation distance but is still heavily reliant on the walker while standing on L knee secondary to ROM limitations.   Follow Up Recommendations  Home health PT (per continued progress)     Equipment Recommendations  None recommended by PT    Recommendations for Other Services       Precautions / Restrictions Precautions Precautions: Knee;Fall Restrictions LLE Weight Bearing: Weight bearing as tolerated    Mobility  Bed Mobility Overal bed mobility: Needs Assistance Bed Mobility: Supine to Sit;Sit to Supine     Supine to sit: Min guard Sit to supine: Min guard;Min assist   General bed mobility comments: Pt again slow and guarded with getting in/out of bed, but was able to do both with extra time and some cuing.  Very minimal assist with L LE getting back into bed from sitting.   Transfers Overall transfer level: Modified independent Equipment used: Rolling walker (2 wheeled) Transfers: Sit to/from Stand Sit to Stand: Min guard         General transfer comment: Pt able to rise to standing with more confidence and less reliance on UEs.   Ambulation/Gait Ambulation/Gait assistance: Min guard Ambulation Distance (Feet): 35 Feet Assistive device: Rolling walker (2 wheeled)   Gait velocity: Decreased   General Gait Details: Pt continues to be slow with inconsistent cadence, but  shows better ability to advance each foot and though she continues to lack TKE she ultimatley does not need direct physical assist but clearly fatigues and has considerable L knee pain with the effort.   Stairs            Wheelchair Mobility    Modified Rankin (Stroke Patients Only)       Balance                                    Cognition Arousal/Alertness: Awake/alert Behavior During Therapy: WFL for tasks assessed/performed Overall Cognitive Status: Within Functional Limits for tasks assessed                      Exercises Total Joint Exercises Ankle Circles/Pumps: Strengthening;Both;10 reps;Supine Quad Sets: Strengthening;Both;10 reps;Supine Gluteal Sets: Strengthening;Both;10 reps;Supine Short Arc Quad: AAROM;10 reps;Left Heel Slides: AROM;AAROM;10 reps;Left Hip ABduction/ADduction: Strengthening;Left;10 reps;Supine Straight Leg Raises: AAROM;10 reps;Left Knee Flexion: PROM;5 reps;Left Goniometric ROM: 3 X 10 pulsed extension overpressure per pt tolerance    General Comments        Pertinent Vitals/Pain Pain Score: 9  Pain Location: continues to c/o signficant pain with all acts    Home Living                      Prior Function            PT Goals (current  goals can now be found in the care plan section) Progress towards PT goals: Progressing toward goals    Frequency  BID    PT Plan Current plan remains appropriate    Co-evaluation             End of Session Equipment Utilized During Treatment: Gait belt Activity Tolerance: Patient limited by pain Patient left: with bed alarm set;with call bell/phone within reach;with family/visitor present     Time: 1335-1403 PT Time Calculation (min) (ACUTE ONLY): 28 min  Charges:  $Gait Training: 8-22 mins $Therapeutic Exercise: 8-22 mins                    G Codes:     [name], PT, DPT [phone]  [name] 05/03/2015, 3:18 PM

## 2015-05-03 NOTE — Progress Notes (Signed)
Assumed care, received report from nurse.

## 2015-05-03 NOTE — Progress Notes (Signed)
Physical Therapy Treatment Patient Details Name: Gina Frazier MRN: [id number] DOB: Mar 19, 1945 Today's Date: 05/03/2015    History of Present Illness This patient is a 70 year old female who came to [facility] for a L TKR.    PT Comments    Pt shows good effort with PT but is pain limited.  Her biggest issue at this time seems to be knee extension - even with significant focus on active and passive ROM activities she is still only able to achieve ~16 degree shy of terminal knee extension (she does have >90 degrees of flexion).  She ambulated with limp secondary to lack of knee extension and was heavily reliant on the walker.  Pt shows good effort but ROM and pain are significant limiters.  Follow Up Recommendations  Home health PT     Equipment Recommendations  None recommended by PT    Recommendations for Other Services       Precautions / Restrictions Precautions Precautions: Knee;Fall Precaution Booklet Issued: Yes (comment) (per PT) Restrictions LLE Weight Bearing: Weight bearing as tolerated    Mobility  Bed Mobility Overal bed mobility: Needs Assistance Bed Mobility: Supine to Sit     Supine to sit: Min assist;Mod assist     General bed mobility comments: Pt slow and cautious with getting to EOB.  She shows good effort, but does require some min assist.  Transfers Overall transfer level: Modified independent Equipment used: Rolling walker (2 wheeled) Transfers: Sit to/from Stand           General transfer comment: Pt is able to get to standing with considerable effort and much cuing for set up and sequencing.  Pt's knee remains flexed in standing and she is reliant on the walker.   Ambulation/Gait Ambulation/Gait assistance: Min assist Ambulation Distance (Feet): 25 Feet Assistive device: Rolling walker (2 wheeled)       General Gait Details: Pt is slow and hesitant with ambulation and c/o L knee pain but is able to ambulate w/o needing direct physical  assist.  Her knee remains flexed and due to this she is heavily reliant on the walker. Pt with no LOBs, but is slow and cautious.   Stairs            Wheelchair Mobility    Modified Rankin (Stroke Patients Only)       Balance                                    Cognition Arousal/Alertness: Awake/alert Behavior During Therapy: WFL for tasks assessed/performed Overall Cognitive Status: Within Functional Limits for tasks assessed                      Exercises Total Joint Exercises Ankle Circles/Pumps: Strengthening;Both;10 reps;Supine Quad Sets: Strengthening;Both;10 reps;Supine Gluteal Sets: Strengthening;Both;10 reps;Supine Short Arc Quad: AAROM;10 reps;Left Heel Slides: AROM;AAROM;10 reps;Left Hip ABduction/ADduction: Strengthening;Left;10 reps;Supine Knee Flexion: PROM;5 reps;Left Goniometric ROM: 16-91    General Comments        Pertinent Vitals/Pain Pain Score: 10-Worst pain ever Pain Location: Pt reports severe pain, but does not have hyper-reaction with most ROM/mobiltiy though she is obviously in some pain    Home Living Family/patient expects to be discharged to:: Private residence Living Arrangements: Children Available Help at Discharge: Family Type of Home: House Home Access: Stairs to enter Entrance Stairs-Rails: Can reach both Home Layout: Two level;Able to live on main level  with bedroom/bathroom Home Equipment: Toilet riser;Walker - 2 wheels;Bedside commode;Shower seat;Grab bars - tub/shower      Prior Function Level of Independence: Independent          PT Goals (current goals can now be found in the care plan section) Acute Rehab PT Goals Patient Stated Goal: to go home Progress towards PT goals: Progressing toward goals    Frequency  BID    PT Plan Current plan remains appropriate    Co-evaluation             End of Session Equipment Utilized During Treatment: Gait belt Activity Tolerance: Patient  limited by pain Patient left: in chair;with call bell/phone within reach;with chair alarm set;with SCD's reapplied     Time: [id number] PT Time Calculation (min) (ACUTE ONLY): 26 min  Charges:  $Gait Training: 8-22 mins $Therapeutic Exercise: 8-22 mins                    G Codes:     [name], PT, DPT [phone]  [name] 05/03/2015, 1:27 PM

## 2015-05-03 NOTE — Evaluation (Signed)
Occupational Therapy Evaluation Patient Details Name: Gina Frazier MRN: [id number] DOB: 07-Sep-1945 Today's Date: 05/03/2015    History of Present Illness This patient is a 70 year old female who came to [facility] for a L TKR.   Clinical Impression   This patient is a 70 year old female who came to [facility] for a L total knee replacement.  Patient lives in a 2 story home with her 2 daughters and grandchildren.  She can stay on first floor.  She had been independent with ADL and functional mobility. She now shows deficits with pain, mobility, and activities of daily living. She would benefit from Occupational Therapy for ADL/functional mobility training.      Follow Up Recommendations       Equipment Recommendations       Recommendations for Other Services       Precautions / Restrictions Precautions Precautions: Knee;Fall Precaution Booklet Issued: Yes (comment) (per PT) Restrictions LLE Weight Bearing: Weight bearing as tolerated      Mobility Bed Mobility                  Transfers                      Balance                                            ADL                                         General ADL Comments: Patient had been independent with ADL with out assistive devices.  Today practiced techniques for lower body dressing using hip kit as she cannot reach her feet. Using hand over hand assist, verbal cues and physical cues for technique and safety using hip kit. Given written list of vendors that carry hip kit.      Vision     Perception     Praxis      Pertinent Vitals/Pain Pain Score: 10-Worst pain ever Pain Location: L knee (Reports she has been keeping up with her meds.)     Hand Dominance Right   Extremity/Trunk Assessment Upper Extremity Assessment Upper Extremity Assessment: Overall WFL for tasks assessed   Lower Extremity Assessment Lower Extremity  Assessment: Defer to PT evaluation       Communication Communication Communication: No difficulties   Cognition Arousal/Alertness: Awake/alert Behavior During Therapy: WFL for tasks assessed/performed Overall Cognitive Status: Within Functional Limits for tasks assessed                     General Comments       Exercises       Shoulder Instructions      Home Living Family/patient expects to be discharged to:: Private residence Living Arrangements: Children Available Help at Discharge: Family Type of Home: House Home Access: Stairs to enter Technical brewer of Steps: 5 Entrance Stairs-Rails: Can reach both Home Layout: Two level;Able to live on main level with bedroom/bathroom Alternate Level Stairs-Number of Steps: 6 Alternate Level Stairs-Rails: Can reach both Bathroom Shower/Tub: Tub/shower unit         Home Equipment: Toilet riser;Walker - 2 wheels;Bedside commode;Shower seat;Grab bars - tub/shower  Prior Functioning/Environment Level of Independence: Independent             OT Diagnosis: Acute pain   OT Problem List: Decreased range of motion;Decreased activity tolerance;Impaired balance (sitting and/or standing);Decreased knowledge of use of DME or AE;Pain.   OT Treatment/Interventions: Self-care/ADL training    OT Goals(Current goals can be found in the care plan section) Acute Rehab OT Goals Patient Stated Goal: to go home OT Goal Formulation: With patient Time For Goal Achievement: 05/17/15 Potential to Achieve Goals: Good  OT Frequency: Min 1X/week   Barriers to D/C:            Co-evaluation              End of Session Equipment Utilized During Treatment:  (hip kit)  Activity Tolerance: No increased pain Patient left: in chair;with call bell/phone within reach;with chair alarm set   Time: 1023-1040 OT Time Calculation (min): 17 min Charges:  OT General Charges $OT Visit: 1 Procedure OT Evaluation $OT  Eval Low Complexity: 1 Procedure G-Codes:    Myrene Galas, MS/OTR/L  05/03/2015, 10:49 AM

## 2015-05-03 NOTE — Care Management Note (Signed)
Case Management Note  Patient Details  Name: Darya Bigler MRN: [id number] Date of Birth: 09/04/1945  Subjective/Objective:                  Met with patient to discuss discharge planning. She plans to return home with her 2 daughters whom she states is very supportive. She would like to use [place] home health for HHPT. She is on Eliquis. She has a rolling walker for use in the home.  Action/Plan: List of home health agencies left with patient. Referral called to [facility] for [place]. I have notified Butch Penny with [facility] Case mgr with [employer] [phone]. [phone] by voicemail. RNCM will continue to follow.   Expected Discharge Date:                  Expected Discharge Plan:     In-House Referral:     Discharge planning Services  CM Consult  Post Acute Care Choice:  Home Health Choice offered to:  Patient  DME Arranged:    DME Agency:     HH Arranged:    HH Agency:  [place]  Status of Service:  In process, will continue to follow  Medicare Important Message Given:    Date Medicare IM Given:    Medicare IM give by:    Date Additional Medicare IM Given:    Additional Medicare Important Message give by:     If discussed at [place] of Stay Meetings, dates discussed:    Additional Comments:  Marshell Garfinkel, RN 05/03/2015, 10:24 AM

## 2015-05-03 NOTE — Progress Notes (Signed)
Clinical Social Worker (CSW) received SNF consult. PT is recommending home health. RN Case Manager is aware of above. Please reconsult if future social work needs arise. CSW signing off.   Azell Bill Morgan, LCSW [phone] 

## 2015-05-03 NOTE — Progress Notes (Signed)
   Subjective: 1 Day Post-Op Procedure(s) (LRB): TOTAL KNEE ARTHROPLASTY (Left) Patient reports pain as 10 on 0-10 scale.   Patient is having problems with pain in the distal quads, requiring pain medications Denies any CP, SOB, ABD pain. We will continue therapy today.  Plan is to go Home after hospital stay.  Objective: Vital signs in last 24 hours: Temp:  [97.3 F (36.3 C)-98.9 F (37.2 C)] 98.9 F (37.2 C) (02/17 0725) Pulse Rate:  [60-77] 72 (02/17 0725) Resp:  [13-19] 18 (02/17 0725) BP: (116-177)/(44-70) 133/49 mmHg (02/17 0725) SpO2:  [92 %-100 %] 93 % (02/17 0725) Weight:  [72.576 kg (160 lb)] 72.576 kg (160 lb) (02/16 1956)  Intake/Output from previous day: 02/16 0701 - 02/17 0700 In: 4077.5 [P.O.:1240; I.V.:2737.5; IV Piggyback:100] Out: [id number] [Urine:1255; Blood:50] Intake/Output this shift:     Recent Labs  05/03/15 0424  HGB 10.7*    Recent Labs  05/03/15 0424  WBC 7.3  RBC 3.54*  HCT 32.3*  PLT 167    Recent Labs  05/03/15 0424  NA 138  K 4.3  CL 103  CO2 30  BUN 16  CREATININE 0.68  GLUCOSE 119*  CALCIUM 8.4*   No results for input(s): LABPT, INR in the last 72 hours.  EXAM General - Patient is Alert, Appropriate and Oriented Extremity - Neurovascular intact Sensation intact distally Intact pulses distally Dorsiflexion/Plantar flexion intact Dressing - dressing C/D/I and no drainage Motor Function - intact, moving foot and toes well on exam.   Past Medical History  Diagnosis Date  . Hypertension   . Palpitations   . Osteoarthritis   . Chronic back pain   . Hip joint pain     left  . Anxiety   . Family history of adverse reaction to anesthesia     pts daughter had severe vomiting 2014   . Dysrhythmia     A Fib   . Wears dentures     partial upper  . Wears contact lenses     Assessment/Plan:   1 Day Post-Op Procedure(s) (LRB): TOTAL KNEE ARTHROPLASTY (Left) Active Problems:   Primary osteoarthritis of  knee  Estimated body mass index is 29.26 kg/(m^2) as calculated from the following:   Height as of this encounter: 5\' 2"  (1.575 m).   Weight as of this encounter: 72.576 kg (160 lb). Advance diet Up with therapy  Needs BM Recheck labs in the am Add ultram to pain regimen  DVT Prophylaxis - eliquis Weight-Bearing as tolerated to left leg D/C O2 and Pulse OX and try on Room Air  T. Rachelle Hora, PA-C [place] 05/03/2015, 7:38 AM

## 2015-05-03 NOTE — Progress Notes (Signed)
PHYSICAL THERAPY EVALUATION  Late entry note for 05/02/15.   Clinical Impression: Pt reports high levels of pain but agrees to PT evaluation. She demonstrates good sequencing with transfers and very limited ambulation from bed to recliner. Pt with considerable L knee flexion at rest which pt reports is chronic prior to knee replacement. Encouraged proper positioning to prevent worsening knee flexion. She may need bone foam to prevent external rotation at rest and encouraged improved knee extension. Pt will be safe to return home with family support and HH PT. Pt will benefit from skilled PT services to address deficits in strength, balance, and mobility in order to return to full function at home.    05/02/15 1515  PT Visit Information  Last PT Received On 05/02/15  Assistance Needed +1  History of Present Illness Pt underwent L TKR without reported post-op complications. Pt reporting high levels of pain (10/10) at time of evaluation. Evaluation performed on POD#0.  Precautions  Precautions Knee;Fall  Precaution Booklet Issued Yes (comment)  Restrictions  Weight Bearing Restrictions Yes  LLE Weight Bearing WBAT  Home Living  Family/patient expects to be discharged to: Private residence  Living Arrangements Children  Available Help at Discharge Family  Type of [place] to enter  Entrance Stairs-Number of Steps 5  Entrance Stairs-Rails Can reach both  [place] Two level;Able to live on main level with bedroom/bathroom  Alternate Level Stairs-Number of Steps 6  Alternate Level Stairs-Rails Can reach both  Bathroom Shower/Tub Tub/shower unit  [employer] Handicapped height  Bathroom Accessibility Yes  Home Equipment Toilet riser;Walker - 2 wheels;BSC;Shower seat;Grab bars - tub/shower (no wheelchair, no hospital bed)  Additional Comments .  Prior Function  Level of Independence Independent  Comments Independent with ADLs/IADLs  Communication   Communication Other (comment) (Prefers [place], native language is Spanish)  Pain Assessment  Pain Assessment 0-10  Pain Score 10  Pain Location L knee  Pain Intervention(s) Premedicated before session;Monitored during session;Limited activity within patient's tolerance  Cognition  Arousal/Alertness Awake/alert  Behavior During Therapy WFL for tasks assessed/performed  Overall Cognitive Status Within Functional Limits for tasks assessed  Upper Extremity Assessment  Upper Extremity Assessment Overall WFL for tasks assessed  Lower Extremity Assessment  Lower Extremity Assessment LLE deficits/detail  LLE Deficits / Details Pt reports full LLE sensation. DF/PF intact. Full SLR and SAQ without assist. RLE appears to be grossly [facility]  Bed Mobility  Overal bed mobility Needs Assistance  Bed Mobility Supine to Sit  Supine to sit Min assist  General bed mobility comments MinA+1 for LLE to prevent rapid flexion when sitting at EOB.  Transfers  Overall transfer level Needs assistance  Equipment used Rolling walker (2 wheeled)  Transfers Sit to/from Stand  Sit to Stand Min guard  General transfer comment Pt requires cues for proper hand placement and sequencing. Poor weight shifting to LLE in standing with residual L knee flexion  Ambulation/Gait  Ambulation/Gait assistance Min guard  Ambulation Distance (Feet) 3 Feet  Assistive device Rolling walker (2 wheeled)  Gait Pattern/deviations Decreased step length - right;Decreased stance time - left;Decreased weight shift to left  General Gait Details Pt requires cues for proper sequencing with walker. Tactile input for proper stepping and encouragement to allow increased WB through LLE. Pt ransfers from bed to recliner  Gait velocity Decreased  Gait velocity interpretation <1.8 ft/sec, indicative of risk for recurrent falls  Balance  Overall balance assessment Needs assistance  Sitting-balance support No upper  extremity supported  Sitting  balance-Leahy Scale Good  Standing balance support No upper extremity supported  Standing balance-Leahy Scale Fair  Standing balance comment Pt able to remove hands and maintain balance with CGA only. Primarily bearing weight through RLE  Exercises  Exercises Total Joint  Total Joint Exercises  Ankle Circles/Pumps Strengthening;Both;10 reps;Supine  Quad Sets Strengthening;Both;10 reps;Supine  Gluteal Sets Strengthening;Both;10 reps;Supine  Towel Squeeze Strengthening;Both;10 reps;Supine  Short Arc Quad Strengthening;Left;10 reps;Supine  Heel Slides Strengthening;Left;10 reps;Supine  Hip ABduction/ADduction Strengthening;Left;10 reps;Supine  Straight Leg Raises Strengthening;Left;10 reps;Supine  Goniometric ROM -26 to 94 degrees AAROM, pain limited. Pt appears to have residual knee flexion from prior to surgery as end feel appears to be muscle length issue. Confirms with questioning  PT - End of Session  Equipment Utilized During Treatment Gait belt  Activity Tolerance Patient limited by pain  Patient left in chair;with call bell/phone within reach;with chair alarm set;with SCD's reapplied (polar care in place, towel roll under heel)  Nurse Communication Mobility status  PT Assessment  PT Therapy Diagnosis  Abnormality of gait;Difficulty walking;Generalized weakness;Acute pain  PT Recommendation/Assessment Patient needs continued PT services  PT Problem List Decreased range of motion;Decreased strength;Decreased activity tolerance;Decreased balance;Decreased mobility;Decreased knowledge of use of DME;Pain  PT Plan  PT Frequency (ACUTE ONLY) BID  PT Treatment/Interventions (ACUTE ONLY) DME instruction;Gait training;Stair training;Therapeutic exercise;Therapeutic activities;Balance training;Patient/family education;Neuromuscular re-education;Manual techniques  PT Recommendation  Follow Up Recommendations Home health PT  PT equipment None recommended by PT  Individuals Consulted   Consulted and Agree with Results and Recommendations Patient  Acute Rehab PT Goals  Patient Stated Goal Return home with improved function  PT Goal Formulation With patient  Time For Goal Achievement 05/17/15  Potential to Achieve Goals Good  PT Time Calculation  PT Start Time (ACUTE ONLY) 1433  PT Stop Time (ACUTE ONLY) 1500  PT Time Calculation (min) (ACUTE ONLY) 27 min  PT General Charges  $$ ACUTE PT VISIT 1 Procedure  PT Evaluation  $PT Eval Low Complexity 1 Procedure  PT Treatments  $Therapeutic Exercise 8-22 mins  Written Expression  Dominant Hand Right    Lyndel Safe Huprich PT, DPT   8:21 AM 05/03/2015

## 2015-05-04 MED ORDER — TRAMADOL HCL 50 MG PO TABS: 50.0000 mg | ORAL_TABLET | Freq: Four times a day (QID) | ORAL | Status: DC | PRN

## 2015-05-04 MED ORDER — OXYCODONE HCL 5 MG PO TABS: 5.0000 mg | ORAL_TABLET | ORAL | Status: DC | PRN

## 2015-05-04 NOTE — Progress Notes (Signed)
Physical Therapy Treatment Patient Details Name: Gina Frazier MRN: [id number] DOB: 03/08/1946 Today's Date: 05/04/2015    History of Present Illness This patient is a 70 year old female who came to [facility] for a L TKR.    PT Comments    Pt is able to ambulate fully around the nurses' station this afternoon and showed relatively consistent and safe ambulation t/o.  She still lacks TKE, but has no buckling and is able to trust her knee more than any previous attempt.  She is making gains with her knee extension and has been working on [employer] acts and using the "bone foam" to do this.   Follow Up Recommendations  Home health PT     Equipment Recommendations       Recommendations for Other Services       Precautions / Restrictions Precautions Precautions: Knee;Fall Restrictions Weight Bearing Restrictions: No LLE Weight Bearing: Weight bearing as tolerated    Mobility  Bed Mobility Overal bed mobility: Independent Bed Mobility: Supine to Sit;Sit to Supine     Supine to sit: Supervision Sit to supine: Supervision   General bed mobility comments: Pt is able to get to sitting w/o assist and then after long ambulation is able to get back into bed without direct assist, though she does need UEs to lift L LE into bed  Transfers Overall transfer level: Modified independent Equipment used: Rolling walker (2 wheeled) Transfers: Sit to/from Stand Sit to Stand: Min guard         General transfer comment: Pt needing less VCs to get to standing and back to sitting safely.  She did struggle intially on getting up until she placed her hands better.    Ambulation/Gait Ambulation/Gait assistance: Min guard Ambulation Distance (Feet): 200 Feet Assistive device: Rolling walker (2 wheeled)       General Gait Details: Pt continues to have issues with TKE but was able to actively try to put weight through her L heel and did show increased ability to take weight on L with no buckling even  with PT actively advancing walker to lessen UE load.  Pt fatigued after walking fully around the nurses' station, but ultimately she showed great motivation and ability to push herself with ambulation.    Stairs            Wheelchair Mobility    Modified Rankin (Stroke Patients Only)       Balance                                    Cognition Arousal/Alertness: Awake/alert Behavior During Therapy: WFL for tasks assessed/performed Overall Cognitive Status: Within Functional Limits for tasks assessed                      Exercises Total Joint Exercises Ankle Circles/Pumps: Strengthening;Frazier;10 reps;Supine Quad Sets: Strengthening;Frazier;10 reps;Supine Gluteal Sets: Strengthening;Frazier;10 reps;Supine Short Arc Quad: AAROM;10 reps;Left Heel Slides: AROM;AAROM;10 reps;Left Hip ABduction/ADduction: Strengthening;Left;10 reps;Supine Straight Leg Raises: AAROM;10 reps;Left Knee Flexion: PROM;5 reps;Left Goniometric ROM: pulsed knee extension overpessure 5 X 10    General Comments        Pertinent Vitals/Pain Pain Score: 6     Home Living                      Prior Function            PT Goals (current  goals can now be found in the care plan section) Acute Rehab PT Goals Patient Stated Goal: to go home Progress towards PT goals: Progressing toward goals    Frequency  BID    PT Plan Current plan remains appropriate    Co-evaluation             End of Session Equipment Utilized During Treatment: Gait belt Activity Tolerance: Patient tolerated treatment well Patient left: with bed alarm set;with call bell/phone within reach;with family/visitor present     Time: [id number] PT Time Calculation (min) (ACUTE ONLY): 27 min  Charges:  $Gait Training: 8-22 mins $Therapeutic Exercise: 8-22 mins                    G Codes:     Gina Frazier, PT, DPT [phone]  Gina Frazier 05/04/2015, 3:50 PM

## 2015-05-04 NOTE — Progress Notes (Signed)
Physical Therapy Treatment Patient Details Name: Gina Frazier MRN: [id number] DOB: Jul 20, 1945 Today's Date: 05/04/2015    History of Present Illness This patient is a 70 year old female who came to [facility] for a L TKR.    PT Comments    Pt is able to ambulate increased distance with increased confidence and speed and is able to negotiate up/down 4 steps with b/l rails.  Pt with significantly more knee extension today (still lacking ~10 degrees) and better quality of motion and increased quad control.  Pt works hard t/o session but is still pain limited and shows some baseline hesitancy though overall she has made consistent and appropriate gains.   Follow Up Recommendations  Home health PT     Equipment Recommendations       Recommendations for Other Services       Precautions / Restrictions Precautions Precautions: Knee;Fall Restrictions Weight Bearing Restrictions: No LLE Weight Bearing: Weight bearing as tolerated    Mobility  Bed Mobility Overal bed mobility: Modified Independent Bed Mobility: Supine to Sit     Supine to sit: Min guard     General bed mobility comments: Pt able to get sitting EOB with increased speed and confidence today needing only minimal cuing.   Transfers Overall transfer level: Modified independent Equipment used: Rolling walker (2 wheeled) Transfers: Sit to/from Stand Sit to Stand: Min guard         General transfer comment: Pt continues to need cues for hand/foot placement/set up, but is able to rise to standing w/o direct assist.  Ambulation/Gait Ambulation/Gait assistance: Min guard Ambulation Distance (Feet): 60 Feet Assistive device: Rolling walker (2 wheeled)       General Gait Details: Pt with increased confidence and speed but does become fatigued with the effort today.  She continues to lack L TKE making her cadence more step-to stop-go but she does not have any safety issues.  Pt making consistent slow gains with  increasing quality of gait, distance and speed.   Stairs Stairs: Yes Stairs assistance: Min guard   Number of Stairs: 4 General stair comments: Pt shows good effort with steps, she is heavily reliant on the rails but is able to do them w/o direct assist though she needs close guarding secondary to her inability to fully extend L knee.  Wheelchair Mobility    Modified Rankin (Stroke Patients Only)       Balance                                    Cognition Arousal/Alertness: Awake/alert Behavior During Therapy: WFL for tasks assessed/performed Overall Cognitive Status: Within Functional Limits for tasks assessed                      Exercises Total Joint Exercises Ankle Circles/Pumps: Strengthening;Frazier;10 reps;Supine Quad Sets: Strengthening;Frazier;10 reps;Supine Gluteal Sets: Strengthening;Frazier;10 reps;Supine Short Arc Quad: AAROM;10 reps;Left Heel Slides: AROM;AAROM;10 reps;Left Hip ABduction/ADduction: Strengthening;Left;10 reps;Supine Straight Leg Raises: AAROM;10 reps;Left Knee Flexion: PROM;5 reps;Left Goniometric ROM: 10-95    General Comments        Pertinent Vitals/Pain Pain Score: 7     Home Living                      Prior Function            PT Goals (current goals can now be found in the care plan  section) Progress towards PT goals: Progressing toward goals    Frequency  BID    PT Plan Current plan remains appropriate    Co-evaluation             End of Session Equipment Utilized During Treatment: Gait belt Activity Tolerance: Patient tolerated treatment well Patient left: with chair alarm set;with call bell/phone within reach     Time: [id number] PT Time Calculation (min) (ACUTE ONLY): 40 min  Charges:  $Gait Training: 8-22 mins $Therapeutic Exercise: 23-37 mins                    G Codes:     Gina Frazier, PT, DPT [phone]  Gina Frazier 05/04/2015, 10:44 AM

## 2015-05-04 NOTE — Progress Notes (Signed)
Occupational Therapy Treatment Patient Details Name: Roann Danger MRN: [id number] DOB: [birth year] Today's Date: 05/04/2015    History of present illness This patient is a 70 year old female who came to [facility] for a L TKR.   OT comments  Pt show improvement in ADL's and using of AE - needed Min A - pt report she has reacher but do not know where her sockaid is - did go the BR earlier and showed no LOB per pt - decline to do BR transfer in BR -pt report she has help at home - hope she can go back to work - she work from [employer] to 1 pm at [employer]  Follow Up Recommendations  Supervision/Assistance - 24 hour    Equipment Recommendations  3 in 1 bedside comode    Recommendations for Other Services      Precautions / Restrictions Precautions Precautions: Knee;Fall Restrictions Weight Bearing Restrictions: No LLE Weight Bearing: Weight bearing as tolerated       Mobility Bed Mobility Transfers   Balance                                   ADL Overall ADL's : Needs assistance/impaired                 Upper Body Dressing : Set up;Independent   Lower Body Dressing: Minimal assistance;With adaptive equipment Lower Body Dressing Details (indicate cue type and reason): Pt has reacher at home - doffing of socks using reacher , donning pants used reacher , doffing ; donning socks needed min A and demo of using sockaid                       Vision                     Perception     Praxis      Cognition   Behavior During Therapy: [facility] for tasks assessed/performed Overall Cognitive Status: Within Functional Limits for tasks assessed                       Extremity/Trunk Assessment               Exercises   Shoulder Instructions       General Comments      Pertinent Vitals/ Pain       Pain Score: 4   Home Living                                          Prior Functioning/Environment               Frequency Min 1X/week     Progress Toward Goals  OT Goals(current goals can now be found in the care plan section)     Acute Rehab OT Goals Patient Stated Goal: to go home OT Goal Formulation: With patient Time For Goal Achievement: 05/17/15 Potential to Achieve Goals: Good  Plan      Co-evaluation                 End of Session     Activity Tolerance Patient tolerated treatment well;No increased pain   Patient Left in chair;with call bell/phone within reach;with chair alarm set   Nurse Communication  Time: [id number] OT Time Calculation (min): 24 min  Charges: OT Treatments $Self Care/Home Management : 23-37 mins  Journey Ratterman OTR/L,CLT 05/04/2015, 12:21 PM

## 2015-05-04 NOTE — Progress Notes (Addendum)
  Subjective: 2 Days Post-Op Procedure(s) (LRB): TOTAL KNEE ARTHROPLASTY (Left) Patient reports pain as mild.   Patient seen in rounds with Dr. Rudene Christians. Patient is well, and has had no acute complaints or problems Plan is to go Home after hospital stay. Negative for chest pain and shortness of breath Fever: no Gastrointestinal: Negative for nausea and vomiting  Objective: Vital signs in last 24 hours: Temp:  [98.7 F (37.1 C)-100.8 F (38.2 C)] 99.2 F (37.3 C) (02/18 0416) Pulse Rate:  [72-78] 77 (02/18 0416) Resp:  [18-20] 18 (02/18 0416) BP: (133-151)/(49-53) 135/53 mmHg (02/18 0416) SpO2:  [93 %-98 %] 93 % (02/18 0416)  Intake/Output from previous day:  Intake/Output Summary (Last 24 hours) at 05/04/15 0636 Last data filed at 05/04/15 0134  Gross per 24 hour  Intake    360 ml  Output    225 ml  Net    135 ml    Intake/Output this shift:    Labs:  Recent Labs  05/03/15 0424  HGB 10.7*    Recent Labs  05/03/15 0424  WBC 7.3  RBC 3.54*  HCT 32.3*  PLT 167    Recent Labs  05/03/15 0424  NA 138  K 4.3  CL 103  CO2 30  BUN 16  CREATININE 0.68  GLUCOSE 119*  CALCIUM 8.4*   No results for input(s): LABPT, INR in the last 72 hours.   EXAM General - Patient is Alert and Oriented Extremity - Neurovascular intact Sensation intact distally No cellulitis present Compartment soft Dressing/Incision - clean, dry, no drainage, new honeycomb dressing applied. Motor Function - intact, moving foot and toes well on exam. The patient ambulated 35 feet with physical therapy.  Past Medical History  Diagnosis Date  . Hypertension   . Palpitations   . Osteoarthritis   . Chronic back pain   . Hip joint pain     left  . Anxiety   . Family history of adverse reaction to anesthesia     pts daughter had severe vomiting 2014   . Dysrhythmia     A Fib   . Wears dentures     partial upper  . Wears contact lenses     Assessment/Plan: 2 Days Post-Op  Procedure(s) (LRB): TOTAL KNEE ARTHROPLASTY (Left) Active Problems:   Primary osteoarthritis of knee  Estimated body mass index is 29.26 kg/(m^2) as calculated from the following:   Height as of this encounter: 5\' 2"  (1.575 m).   Weight as of this encounter: 72.576 kg (160 lb). Up with therapy Discharge home with home health  The patient will need to ambulate with physical therapy as well as doing stairs. The patient will need to have a bowel movement before discharge. The patient will possibly go home today. The patient will follow-up in 2 weeks with Dr. Rudene Christians.  DVT Prophylaxis - Lovenox, Foot Pumps and TED hose Weight-Bearing as tolerated to left leg  Reche Dixon, PA-C Orthopaedic Surgery 05/04/2015, 6:36 AM   Needs BM and work with PT, plan DC tomorrow.

## 2015-05-04 NOTE — Discharge Instructions (Signed)

## 2015-05-04 NOTE — Patient Instructions (Signed)
Ed on using AE for LB dressing - pt report having reacher but need to find her sockaid - daughter needed it years ago

## 2015-05-04 NOTE — Discharge Summary (Signed)
Physician Discharge Summary  Subjective: 2 Days Post-Op Procedure(s) (LRB): TOTAL KNEE ARTHROPLASTY (Left) Patient reports pain as mild.   Patient seen in rounds with Dr. Rudene Christians. Patient is well, and has had no acute complaints or problems Patient is ready to go home with home health physical therapy.  Physician Discharge Summary  Patient ID: Gina Frazier MRN: [id number] DOB/AGE: 07-19-1945 70 y.o.  Admit date: 05/02/2015 Discharge date: 05/04/2015  Admission Diagnoses:  Discharge Diagnoses:  Active Problems:   Primary osteoarthritis of knee   Discharged Condition: fair  Hospital Course: The patient is postop day 2 from a left total knee replacement. She did well on postop day 1 and ambulated 35 feet. The patient had a hemoglobin that had dropped down to 10.7 with no symptoms. The patient had a dressing change and the wound appears to be healing well. She is ready to go home with home health physical therapy.  Treatments: surgery:  PROCEDURE: Procedure(s): TOTAL KNEE ARTHROPLASTY (Left)  SURGEON: Laurene Footman, MD  ASSISTANTS: Rachelle Hora [facility]  ANESTHESIA: spinal  EBL: Total I/O In: 1300 [I.V.:1300] Out: 300 [Urine:250; Blood:50]  BLOOD ADMINISTERED:none  DRAINS: none   LOCAL MEDICATIONS USED: MARCAINE and OTHER morphine and Exparel  SPECIMEN: Source of Specimen: Cut ends of bone  DISPOSITION OF SPECIMEN: PATHOLOGY  COUNTS: YES  TOURNIQUET:  Total Tourniquet Time Documented: Thigh (Left) - 91 minutes Total: Thigh (Left) - 91 minutes   IMPLANTS: Medacta GMK primary total knee system left for and PS cemented femoral component, 3 left tibial component with 12 mm PS insert and size 2 pat  Discharge Exam: Blood pressure 135/53, pulse 77, temperature 99.2 F (37.3 C), temperature source Oral, resp. rate 18, height 5\' 2"  (1.575 m), weight 72.576 kg (160 lb), SpO2 93 %.   Disposition: 01-Home or Self Care     Medication List    TAKE these  medications        acetaminophen 500 MG tablet  Commonly known as:  TYLENOL  Take 1 tablet (500 mg total) by mouth every 6 (six) hours as needed for mild pain or moderate pain.     apixaban 5 MG Tabs tablet  Commonly known as:  ELIQUIS  Take 1 tablet (5 mg total) by mouth 2 (two) times daily.     ergocalciferol 50000 units capsule  Commonly known as:  VITAMIN D2  Take 50,000 Units by mouth once a week.     flecainide 50 MG tablet  Commonly known as:  TAMBOCOR  Take 1 tablet (50 mg total) by mouth 2 (two) times daily.     losartan 50 MG tablet  Commonly known as:  COZAAR  Take 1 tablet (50 mg total) by mouth daily.     nitroGLYCERIN 0.4 MG SL tablet  Commonly known as:  NITROSTAT  Place 1 tablet (0.4 mg total) under the tongue every 5 (five) minutes as needed for chest pain. Call 911 if pain not resolved after 3 pills     oxyCODONE 5 MG immediate release tablet  Commonly known as:  Oxy IR/ROXICODONE  Take 1-2 tablets (5-10 mg total) by mouth every 4 (four) hours as needed for breakthrough pain.     potassium chloride 10 MEQ tablet  Commonly known as:  K-DUR  Take 1 tablet (10 mEq total) by mouth daily.     propranolol ER 80 MG 24 hr capsule  Commonly known as:  INDERAL LA  Take 1 capsule (80 mg total) by mouth daily.  traMADol 50 MG tablet  Commonly known as:  ULTRAM  Take 1-2 tablets (50-100 mg total) by mouth every 6 (six) hours as needed for moderate pain.     traZODone 100 MG tablet  Commonly known as:  DESYREL  Take 1 tablet (100 mg total) by mouth at bedtime as needed for sleep.     Vitamin D 2000 units tablet  Take 2,000 Units by mouth every morning.           Follow-up Information    Follow up with Carmina Walle, MD In 2 weeks.   Specialty:  Orthopedic Surgery   Why:  For staple removal   Contact information:   [address] [place] [address] [phone]       Signed: Prescott Parma, TODD 05/04/2015, 6:40  AM   Objective: Vital signs in last 24 hours: Temp:  [98.7 F (37.1 C)-100.8 F (38.2 C)] 99.2 F (37.3 C) (02/18 0416) Pulse Rate:  [72-78] 77 (02/18 0416) Resp:  [18-20] 18 (02/18 0416) BP: (133-151)/(49-53) 135/53 mmHg (02/18 0416) SpO2:  [93 %-98 %] 93 % (02/18 0416)  Intake/Output from previous day:  Intake/Output Summary (Last 24 hours) at 05/04/15 0640 Last data filed at 05/04/15 0134  Gross per 24 hour  Intake    360 ml  Output    225 ml  Net    135 ml    Intake/Output this shift:    Labs:  Recent Labs  05/03/15 0424  HGB 10.7*    Recent Labs  05/03/15 0424  WBC 7.3  RBC 3.54*  HCT 32.3*  PLT 167    Recent Labs  05/03/15 0424  NA 138  K 4.3  CL 103  CO2 30  BUN 16  CREATININE 0.68  GLUCOSE 119*  CALCIUM 8.4*   No results for input(s): LABPT, INR in the last 72 hours.  EXAM: General - Patient is Alert and Oriented Extremity - Sensation intact distally Dorsiflexion/Plantar flexion intact No cellulitis present Compartment soft Incision - clean, dry, no drainage Motor Function -  the patient ambulated 35 feet and was able to do a straight leg raise with minimal assistance.  Assessment/Plan: 2 Days Post-Op Procedure(s) (LRB): TOTAL KNEE ARTHROPLASTY (Left) Procedure(s) (LRB): TOTAL KNEE ARTHROPLASTY (Left) Past Medical History  Diagnosis Date  . Hypertension   . Palpitations   . Osteoarthritis   . Chronic back pain   . Hip joint pain     left  . Anxiety   . Family history of adverse reaction to anesthesia     pts daughter had severe vomiting 2014   . Dysrhythmia     A Fib   . Wears dentures     partial upper  . Wears contact lenses    Active Problems:   Primary osteoarthritis of knee  Estimated body mass index is 29.26 kg/(m^2) as calculated from the following:   Height as of this encounter: 5\' 2"  (1.575 m).   Weight as of this encounter: 72.576 kg (160 lb). Discharge home with home healthPT and [facility] aide. Diet - Regular  diet Follow up - in 2 weeks Activity - WBAT Disposition - Home Condition Upon Discharge - Stable DVT Prophylaxis - TED hose and Eliquis  Reche Dixon, PA-C Orthopaedic Surgery 05/04/2015, 6:40 AM

## 2015-05-05 NOTE — Progress Notes (Signed)
Physical Therapy Treatment Patient Details Name: Bernard Killoran MRN: [id number] DOB: Dec 27, 1945 Today's Date: 05/05/2015    History of Present Illness This patient is a 70 year old female who came to [facility] for a L TKR.    PT Comments    Pt shows increase knee extension, stair negotiation, and bed mobility.  She still has some limp with ambulation secondary to lacking TKE, but she is safe and consistent with ambulation using a walker.  Encouraged her to really work on quad control and knee extension acts at home.  Follow Up Recommendations  Home health PT     Equipment Recommendations       Recommendations for Other Services       Precautions / Restrictions Restrictions Weight Bearing Restrictions: No LLE Weight Bearing: Weight bearing as tolerated    Mobility  Bed Mobility Overal bed mobility: Independent Bed Mobility: Supine to Sit;Sit to Supine     Supine to sit: Supervision Sit to supine: Supervision   General bed mobility comments: Pt with increased confidence and speed with getting in/out of bed, very minimal cuing needed  Transfers Overall transfer level: Modified independent Equipment used: Rolling walker (2 wheeled) Transfers: Sit to/from Stand Sit to Stand: Supervision         General transfer comment: Again minimal cuing for foot and hand placement/set up, but ultimately she is able to rise w/o assist   Ambulation/Gait Ambulation/Gait assistance: Min guard Ambulation Distance (Feet): 60 Feet Assistive device: Rolling walker (2 wheeled)       General Gait Details: Pt shows increased self selected speed though she does still have L knee in minimally bent posture during WBing though she has no buckling or stability issues.   Stairs Stairs: Yes Stairs assistance: Supervision Stair Management: Two rails Number of Stairs: 4 General stair comments: Pt shows increased confidence and efficiency with steps today.  Does need cuing for good up/bad down  reminders  Wheelchair Mobility    Modified Rankin (Stroke Patients Only)       Balance                                    Cognition Arousal/Alertness: Awake/alert Behavior During Therapy: WFL for tasks assessed/performed Overall Cognitive Status: Within Functional Limits for tasks assessed                      Exercises Total Joint Exercises Ankle Circles/Pumps: Strengthening;Both;10 reps;Supine Quad Sets: Strengthening;Both;10 reps;Supine Gluteal Sets: Strengthening;Both;10 reps;Supine Short Arc Quad: AAROM;10 reps;Left Heel Slides: AROM;AAROM;10 reps;Left Hip ABduction/ADduction: Strengthening;Left;10 reps;Supine Knee Flexion: PROM;5 reps;Left Goniometric ROM: pt lacking less than 10 degrees of extension    General Comments        Pertinent Vitals/Pain Pain Score:  (no pain at rest, moderate during activity)    Home Living                      Prior Function            PT Goals (current goals can now be found in the care plan section) Progress towards PT goals: Progressing toward goals    Frequency  BID    PT Plan Current plan remains appropriate    Co-evaluation             End of Session Equipment Utilized During Treatment: Gait belt Activity Tolerance: Patient tolerated treatment well Patient left: with  bed alarm set;with call bell/phone within reach;with family/visitor present     Time: [id number] PT Time Calculation (min) (ACUTE ONLY): 25 min  Charges:  $Gait Training: 8-22 mins $Therapeutic Exercise: 8-22 mins                    G Codes:     [name], PT, DPT [phone]  [name] 05/05/2015, 11:44 AM

## 2015-05-05 NOTE — Progress Notes (Signed)
Family is very concerned about assisting patient at home when discharged. Daughters at bedside, and wants to speak with the doctor. Daughter states she has lupus and cannot take care of the patient.Reche Dixon paged.

## 2015-05-05 NOTE — Care Management Note (Signed)
Case Management Note  Patient Details  Name: Lisamarie Debois MRN: [id number] Date of Birth: 05-10-1945  Subjective/Objective:    Referral for home health PT was called to [facility]. Ms Zukauskas has a rolling walker at home.                 Action/Plan:   Expected Discharge Date:                  Expected Discharge Plan:     In-House Referral:     Discharge planning Services  CM Consult  Post Acute Care Choice:  Home Health Choice offered to:  Patient  DME Arranged:    DME Agency:     HH Arranged:    HH Agency:  [place]  Status of Service:  In process, will continue to follow  Medicare Important Message Given:    Date Medicare IM Given:    Medicare IM give by:    Date Additional Medicare IM Given:    Additional Medicare Important Message give by:     If discussed at [place] of Stay Meetings, dates discussed:    Additional Comments:  Liviah Cake A, RN 05/05/2015, 8:18 AM

## 2015-05-05 NOTE — Progress Notes (Signed)
  Subjective: 3 Days Post-Op Procedure(s) (LRB): TOTAL KNEE ARTHROPLASTY (Left) Patient reports pain as mild.   Patient seen in rounds with Dr. Rudene Christians. Patient is well, and has had no acute complaints or problems Plan is to go Home after hospital stay. Negative for chest pain and shortness of breath Fever: no Gastrointestinal: Negative for nausea and vomiting  Objective: Vital signs in last 24 hours: Temp:  [98 F (36.7 C)-99.1 F (37.3 C)] 98.4 F (36.9 C) (02/18 1955) Pulse Rate:  [71-83] 79 (02/18 1955) Resp:  [18-19] 19 (02/18 1955) BP: (130-156)/(40-57) 130/40 mmHg (02/18 1955) SpO2:  [94 %-97 %] 97 % (02/18 1955)  Intake/Output from previous day:  Intake/Output Summary (Last 24 hours) at 05/05/15 0614 Last data filed at 05/04/15 1200  Gross per 24 hour  Intake    240 ml  Output    300 ml  Net    -60 ml    Intake/Output this shift:    Labs:  Recent Labs  05/03/15 0424  HGB 10.7*    Recent Labs  05/03/15 0424  WBC 7.3  RBC 3.54*  HCT 32.3*  PLT 167    Recent Labs  05/03/15 0424  NA 138  K 4.3  CL 103  CO2 30  BUN 16  CREATININE 0.68  GLUCOSE 119*  CALCIUM 8.4*   No results for input(s): LABPT, INR in the last 72 hours.   EXAM General - Patient is Alert and Oriented Extremity - Neurovascular intact Sensation intact distally No cellulitis present Compartment soft Dressing/Incision - clean, dry, minimal bloody discharge with drainage, new honeycomb dressing will be applied before discharge. Motor Function - intact, moving foot and toes well on exam. The patient ambulated 200 feet with physical therapy.  Past Medical History  Diagnosis Date  . Hypertension   . Palpitations   . Osteoarthritis   . Chronic back pain   . Hip joint pain     left  . Anxiety   . Family history of adverse reaction to anesthesia     pts daughter had severe vomiting 2014   . Dysrhythmia     A Fib   . Wears dentures     partial upper  . Wears contact lenses      Assessment/Plan: 3 Days Post-Op Procedure(s) (LRB): TOTAL KNEE ARTHROPLASTY (Left) Active Problems:   Primary osteoarthritis of knee  Estimated body mass index is 29.26 kg/(m^2) as calculated from the following:   Height as of this encounter: 5\' 2"  (1.575 m).   Weight as of this encounter: 72.576 kg (160 lb). Up with therapy Discharge home with home health  The patient will do physical therapy this morning. The patient will go home today. The patient will follow-up in 2 weeks with Dr. Rudene Christians.  DVT Prophylaxis - Lovenox, Foot Pumps and TED hose Weight-Bearing as tolerated to left leg  Reche Dixon, PA-C Orthopaedic Surgery 05/05/2015, 6:14 AM

## 2015-05-05 NOTE — Care Management Note (Addendum)
Case Management Note  Patient Details  Name: Cesia Schemenauer MRN: [id number] Date of Birth: Mar 05, 1946  Subjective/Objective:       Referral called to Ardeen Fillers at [place] requesting hh-PT and HH-Aid. Mrs Colgan has a rolling walker at home.            Action/Plan:   Expected Discharge Date:                  Expected Discharge Plan:     In-House Referral:     Discharge planning Services  CM Consult  Post Acute Care Choice:  Home Health Choice offered to:  Patient  DME Arranged:    DME Agency:     HH Arranged:    HH Agency:  [place]  Status of Service:  In process, will continue to follow  Medicare Important Message Given:    Date Medicare IM Given:    Medicare IM give by:    Date Additional Medicare IM Given:    Additional Medicare Important Message give by:     If discussed at [place] of Stay Meetings, dates discussed:    Additional Comments:  Ames Hoban A, RN 05/05/2015, 10:42 AM

## 2015-05-05 NOTE — Progress Notes (Signed)
Dr Rudene Christians called back, family concerned about taking care of patient at home. States he will be here around 1030 to speak with family about concerns.

## 2015-05-06 LAB — SURGICAL PATHOLOGY

## 2015-06-11 ENCOUNTER — Observation Stay: Payer: BLUE CROSS/BLUE SHIELD

## 2015-06-11 ENCOUNTER — Encounter: Payer: Self-pay | Admitting: Emergency Medicine

## 2015-06-11 ENCOUNTER — Observation Stay
Admission: EM | Admit: 2015-06-11 | Discharge: 2015-06-12 | Disposition: A | Discharge status: Home / Self Care | Payer: BLUE CROSS/BLUE SHIELD | Attending: Internal Medicine | Admitting: Internal Medicine

## 2015-06-11 DIAGNOSIS — Z8249 Family history of ischemic heart disease and other diseases of the circulatory system: Secondary | ICD-10-CM | POA: Diagnosis not present

## 2015-06-11 DIAGNOSIS — Z8261 Family history of arthritis: Secondary | ICD-10-CM | POA: Diagnosis not present

## 2015-06-11 DIAGNOSIS — R002 Palpitations: Secondary | ICD-10-CM | POA: Insufficient documentation

## 2015-06-11 DIAGNOSIS — E46 Unspecified protein-calorie malnutrition: Secondary | ICD-10-CM | POA: Insufficient documentation

## 2015-06-11 DIAGNOSIS — N390 Urinary tract infection, site not specified: Secondary | ICD-10-CM

## 2015-06-11 DIAGNOSIS — Z8349 Family history of other endocrine, nutritional and metabolic diseases: Secondary | ICD-10-CM | POA: Diagnosis not present

## 2015-06-11 DIAGNOSIS — M25552 Pain in left hip: Secondary | ICD-10-CM | POA: Insufficient documentation

## 2015-06-11 DIAGNOSIS — I071 Rheumatic tricuspid insufficiency: Secondary | ICD-10-CM | POA: Diagnosis not present

## 2015-06-11 DIAGNOSIS — I10 Essential (primary) hypertension: Secondary | ICD-10-CM | POA: Diagnosis not present

## 2015-06-11 DIAGNOSIS — I480 Paroxysmal atrial fibrillation: Secondary | ICD-10-CM | POA: Insufficient documentation

## 2015-06-11 DIAGNOSIS — Z9049 Acquired absence of other specified parts of digestive tract: Secondary | ICD-10-CM | POA: Diagnosis not present

## 2015-06-11 DIAGNOSIS — M7989 Other specified soft tissue disorders: Secondary | ICD-10-CM | POA: Insufficient documentation

## 2015-06-11 DIAGNOSIS — R700 Elevated erythrocyte sedimentation rate: Secondary | ICD-10-CM | POA: Insufficient documentation

## 2015-06-11 DIAGNOSIS — Z96652 Presence of left artificial knee joint: Secondary | ICD-10-CM | POA: Diagnosis not present

## 2015-06-11 DIAGNOSIS — F411 Generalized anxiety disorder: Secondary | ICD-10-CM | POA: Diagnosis not present

## 2015-06-11 DIAGNOSIS — R0602 Shortness of breath: Secondary | ICD-10-CM | POA: Diagnosis not present

## 2015-06-11 DIAGNOSIS — R531 Weakness: Principal | ICD-10-CM | POA: Insufficient documentation

## 2015-06-11 DIAGNOSIS — Z818 Family history of other mental and behavioral disorders: Secondary | ICD-10-CM | POA: Diagnosis not present

## 2015-06-11 DIAGNOSIS — Z833 Family history of diabetes mellitus: Secondary | ICD-10-CM | POA: Diagnosis not present

## 2015-06-11 DIAGNOSIS — R6883 Chills (without fever): Secondary | ICD-10-CM | POA: Diagnosis not present

## 2015-06-11 DIAGNOSIS — I517 Cardiomegaly: Secondary | ICD-10-CM | POA: Diagnosis not present

## 2015-06-11 DIAGNOSIS — R634 Abnormal weight loss: Secondary | ICD-10-CM | POA: Diagnosis not present

## 2015-06-11 DIAGNOSIS — R0683 Snoring: Secondary | ICD-10-CM | POA: Diagnosis not present

## 2015-06-11 DIAGNOSIS — M1710 Unilateral primary osteoarthritis, unspecified knee: Secondary | ICD-10-CM | POA: Diagnosis not present

## 2015-06-11 DIAGNOSIS — G8929 Other chronic pain: Secondary | ICD-10-CM | POA: Insufficient documentation

## 2015-06-11 DIAGNOSIS — F410 Panic disorder [episodic paroxysmal anxiety] without agoraphobia: Secondary | ICD-10-CM | POA: Diagnosis not present

## 2015-06-11 DIAGNOSIS — M25562 Pain in left knee: Secondary | ICD-10-CM | POA: Diagnosis not present

## 2015-06-11 DIAGNOSIS — I472 Ventricular tachycardia, unspecified: Secondary | ICD-10-CM

## 2015-06-11 DIAGNOSIS — M549 Dorsalgia, unspecified: Secondary | ICD-10-CM | POA: Insufficient documentation

## 2015-06-11 DIAGNOSIS — E559 Vitamin D deficiency, unspecified: Secondary | ICD-10-CM | POA: Diagnosis not present

## 2015-06-11 HISTORY — DX: Paroxysmal atrial fibrillation: I48.0

## 2015-06-11 LAB — URINALYSIS COMPLETE WITH MICROSCOPIC (ARMC ONLY)
BACTERIA UA: NONE SEEN
Glucose, UA: NEGATIVE mg/dL
HGB URINE DIPSTICK: NEGATIVE
Leukocytes, UA: NEGATIVE
Nitrite: NEGATIVE
PH: 5 (ref 5.0–8.0)
PROTEIN: 30 mg/dL — AB
RBC / HPF: NONE SEEN RBC/hpf (ref 0–5)
Specific Gravity, Urine: 1.025 (ref 1.005–1.030)

## 2015-06-11 LAB — BASIC METABOLIC PANEL
ANION GAP: 8 (ref 5–15)
BUN: 15 mg/dL (ref 6–20)
CALCIUM: 9.2 mg/dL (ref 8.9–10.3)
CO2: 24 mmol/L (ref 22–32)
Chloride: 101 mmol/L (ref 101–111)
Creatinine, Ser: 0.9 mg/dL (ref 0.44–1.00)
GFR calc Af Amer: >60 mL/min (ref >60)
GFR calc non Af Amer: >60 mL/min (ref >60)
GLUCOSE: 98 mg/dL (ref 65–99)
Potassium: 4.1 mmol/L (ref 3.5–5.1)
SODIUM: 133 mmol/L — AB (ref 135–145)

## 2015-06-11 LAB — CBC
HEMATOCRIT: 35.9 % (ref 35.0–47.0)
HEMOGLOBIN: 11.8 g/dL — AB (ref 12.0–16.0)
MCH: 29 pg (ref 26.0–34.0)
MCHC: 32.9 g/dL (ref 32.0–36.0)
MCV: 88.1 fL (ref 80.0–100.0)
Platelets: 242 10*3/uL (ref 150–440)
RBC: 4.07 MIL/uL (ref 3.80–5.20)
RDW: 14.4 % (ref 11.5–14.5)
WBC: 5.5 10*3/uL (ref 3.6–11.0)

## 2015-06-11 LAB — MAGNESIUM: MAGNESIUM: 2.3 mg/dL (ref 1.7–2.4)

## 2015-06-11 LAB — TROPONIN I

## 2015-06-11 LAB — TSH: TSH: 3.154 u[IU]/mL (ref 0.350–4.500)

## 2015-06-11 LAB — SEDIMENTATION RATE: SED RATE: 48 mm/h — AB (ref 0–30)

## 2015-06-11 MED ORDER — NITROGLYCERIN 0.4 MG SL SUBL
0.4000 mg | SUBLINGUAL_TABLET | SUBLINGUAL | Status: DC | PRN
Start: 2015-06-11 — End: 2015-06-12

## 2015-06-11 MED ORDER — DOCUSATE SODIUM 100 MG PO CAPS
100.0000 mg | ORAL_CAPSULE | Freq: Two times a day (BID) | ORAL | Status: DC
  Administered 2015-06-11 – 2015-06-12 (×2): 100 mg via ORAL
  Filled 2015-06-11 (×2): qty 1

## 2015-06-11 MED ORDER — MAGNESIUM SULFATE IN D5W 10-5 MG/ML-% IV SOLN
1.0000 g | Freq: Once | INTRAVENOUS | Status: AC
  Administered 2015-06-11: 1 g via INTRAVENOUS
  Filled 2015-06-11: qty 100

## 2015-06-11 MED ORDER — FLECAINIDE ACETATE 50 MG PO TABS
50.0000 mg | ORAL_TABLET | Freq: Two times a day (BID) | ORAL | Status: DC
  Administered 2015-06-11 – 2015-06-12 (×2): 50 mg via ORAL
  Filled 2015-06-11 (×2): qty 1

## 2015-06-11 MED ORDER — ACETAMINOPHEN 325 MG PO TABS
650.0000 mg | ORAL_TABLET | Freq: Four times a day (QID) | ORAL | Status: DC | PRN
Start: 2015-06-11 — End: 2015-06-12

## 2015-06-11 MED ORDER — TRAMADOL HCL 50 MG PO TABS: 50.0000 mg | ORAL_TABLET | Freq: Four times a day (QID) | ORAL | Status: DC | PRN

## 2015-06-11 MED ORDER — POLYETHYLENE GLYCOL 3350 17 G PO PACK: 17.0000 g | PACK | Freq: Every day | ORAL | Status: DC | PRN

## 2015-06-11 MED ORDER — ONDANSETRON HCL 4 MG PO TABS: 4.0000 mg | ORAL_TABLET | Freq: Four times a day (QID) | ORAL | Status: DC | PRN

## 2015-06-11 MED ORDER — DEXTROSE 5 % IV SOLN
1.0000 g | Freq: Once | INTRAVENOUS | Status: AC
  Administered 2015-06-11: 1 g via INTRAVENOUS
  Filled 2015-06-11: qty 10

## 2015-06-11 MED ORDER — PROPRANOLOL HCL ER 80 MG PO CP24
80.0000 mg | ORAL_CAPSULE | Freq: Every day | ORAL | Status: DC
  Administered 2015-06-12: 80 mg via ORAL
  Filled 2015-06-11 (×2): qty 1

## 2015-06-11 MED ORDER — ONDANSETRON HCL 4 MG/2ML IJ SOLN
4.0000 mg | Freq: Four times a day (QID) | INTRAMUSCULAR | Status: DC | PRN
Start: 2015-06-11 — End: 2015-06-12

## 2015-06-11 MED ORDER — ACETAMINOPHEN 650 MG RE SUPP: 650.0000 mg | Freq: Four times a day (QID) | RECTAL | Status: DC | PRN

## 2015-06-11 MED ORDER — SODIUM CHLORIDE 0.9% FLUSH
3.0000 mL | Freq: Two times a day (BID) | INTRAVENOUS | Status: DC
Start: 2015-06-11 — End: 2015-06-12
  Administered 2015-06-11 – 2015-06-12 (×2): 3 mL via INTRAVENOUS

## 2015-06-11 MED ORDER — HYDROCODONE-ACETAMINOPHEN 5-325 MG PO TABS
1.0000 | ORAL_TABLET | ORAL | Status: DC | PRN
  Administered 2015-06-12: 1 via ORAL
  Filled 2015-06-11: qty 1

## 2015-06-11 MED ORDER — OXYCODONE HCL 5 MG PO TABS: 5.0000 mg | ORAL_TABLET | ORAL | Status: DC | PRN

## 2015-06-11 MED ORDER — SENNA 8.6 MG PO TABS
1.0000 | ORAL_TABLET | Freq: Two times a day (BID) | ORAL | Status: DC
  Administered 2015-06-11 – 2015-06-12 (×2): 8.6 mg via ORAL
  Filled 2015-06-11 (×2): qty 1

## 2015-06-11 MED ORDER — ERGOCALCIFEROL 1.25 MG (50000 UT) PO CAPS: 50000.0000 [IU] | ORAL_CAPSULE | ORAL | Status: DC

## 2015-06-11 MED ORDER — ENOXAPARIN SODIUM 40 MG/0.4ML ~~LOC~~ SOLN: 40.0000 mg | SUBCUTANEOUS | Status: DC

## 2015-06-11 MED ORDER — SODIUM CHLORIDE 0.9 % IV SOLN
INTRAVENOUS | Status: DC
Start: 2015-06-11 — End: 2015-06-12
  Administered 2015-06-12: 06:00:00 via INTRAVENOUS

## 2015-06-11 MED ORDER — APIXABAN 5 MG PO TABS
5.0000 mg | ORAL_TABLET | Freq: Two times a day (BID) | ORAL | Status: DC
  Administered 2015-06-11 – 2015-06-12 (×2): 5 mg via ORAL
  Filled 2015-06-11 (×2): qty 1

## 2015-06-11 MED ORDER — SODIUM CHLORIDE 0.9 % IV BOLUS (SEPSIS)
500.0000 mL | Freq: Once | INTRAVENOUS | Status: AC
  Administered 2015-06-11: 500 mL via INTRAVENOUS

## 2015-06-11 NOTE — H&P (Signed)
[place] [facility] [place] NAME: Gina Frazier    MR#:  [id number]  DATE OF BIRTH:  01-09-1946  DATE OF ADMISSION:  06/11/2015  PRIMARY CARE PHYSICIAN: Hortencia Conradi, MD   REQUESTING/REFERRING PHYSICIAN: Loura Pardon MD  CHIEF COMPLAINT:   Chief Complaint  Patient presents with  . Weakness    HISTORY OF PRESENT ILLNESS: Ramandeep Khera  is a 70 y.o. female with a known history Of recent left knee replacement in February presents with generalized weakness and chills. Patient reports that since her surgeries she has not been able to eat much because she does not feel like eating. She has also been very weak. Continues to have pain in the left knee. And swelling. She was recently treated for UTI and finished a course of antibiotics yesterday. Her urinalysis here is negative.  However while in the ED patient was noted to have 14 run of V. tach. Therefore the ED physician contacted Dr. Candis Musa and she is being admitted for that. He otherwise denies any chest pain palpitations no syncope  PAST MEDICAL HISTORY:   Past Medical History  Diagnosis Date  . Hypertension   . Palpitations   . Osteoarthritis   . Chronic back pain   . Hip joint pain     left  . Anxiety   . Family history of adverse reaction to anesthesia     pts daughter had severe vomiting 2014   . Dysrhythmia     A Fib   . Wears dentures     partial upper  . Wears contact lenses   . Atrial fibrillation ([place])     PAST SURGICAL HISTORY: Past Surgical History  Procedure Laterality Date  . Cholecystectomy    . Tooth extraction    . Knee arthroscopy Left 06/28/2014    Procedure: ARTHROSCOPY KNEE WITH PARTIALLATERAL MENISECTOMY AND DEBRIDEMENT;  Surgeon: Susa Day, MD;  Location: [facility];  Service: Orthopedics;  Laterality: Left;  . Colonoscopy with propofol N/A 11/05/2014    Procedure: COLONOSCOPY WITH PROPOFOL;  Surgeon: Lucilla Lame, MD;  Location: [facility];   Service: Endoscopy;  Laterality: N/A;  . 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 Joint replacement      SOCIAL HISTORY:  Social History  Substance Use Topics  . Smoking status: Never Smoker   . Smokeless tobacco: Never Used  . Alcohol Use: No    FAMILY HISTORY:  Family History  Problem Relation Age of Onset  . Heart disease Mother   . Hypertension Mother   . Mental illness Mother   . Diabetes Mother   . Heart disease Father   . Hypertension Sister   . Depression Sister   . Diabetes Sister   . Heart disease Brother   . Hypertension Brother   . Arthritis Daughter   . GER disease Daughter   . Hypothyroidism Daughter     DRUG ALLERGIES:  Allergies  Allergen Reactions  . Fish Allergy     REACTION: vomitting, swelling shell fish    REVIEW OF SYSTEMS:   CONSTITUTIONAL: No fever, Positive fatigue and weakness.  EYES: No blurred or double vision.  EARS, NOSE, AND THROAT: No tinnitus or ear pain.  RESPIRATORY: No cough, shortness of breath, wheezing or hemoptysis.  CARDIOVASCULAR: No chest pain, orthopnea, edema.  GASTROINTESTINAL: No nausea, vomiting, diarrhea or abdominal pain.  GENITOURINARY: No dysuria, hematuria.  ENDOCRINE: No polyuria, nocturia,  HEMATOLOGY: No anemia, easy bruising or bleeding SKIN: No rash or lesion. MUSCULOSKELETAL: Left knee pain NEUROLOGIC: No tingling, numbness, weakness.  PSYCHIATRY: No anxiety or depression.   MEDICATIONS AT HOME:  Prior to Admission medications   Medication Sig Start Date End Date Taking? Authorizing Provider  apixaban (ELIQUIS) 5 MG TABS tablet Take 1 tablet (5 mg total) by mouth 2 (two) times daily. 01/28/15  Yes Minna Merritts, MD  ergocalciferol (VITAMIN D2) 50000 units capsule Take 50,000 Units by mouth every Monday.    Yes Historical Provider, MD  flecainide (TAMBOCOR) 50 MG  tablet Take 1 tablet (50 mg total) by mouth 2 (two) times daily. 01/28/15  Yes Minna Merritts, MD  nitroGLYCERIN (NITROSTAT) 0.4 MG SL tablet Place 1 tablet (0.4 mg total) under the tongue every 5 (five) minutes as needed for chest pain. Call 911 if pain not resolved after 3 pills 01/28/15 05/29/16 Yes Minna Merritts, MD  oxyCODONE (OXY IR/ROXICODONE) 5 MG immediate release tablet Take 1-2 tablets (5-10 mg total) by mouth every 4 (four) hours as needed for breakthrough pain. 05/04/15  Yes Reche Dixon, PA-C  propranolol ER (INDERAL LA) 80 MG 24 hr capsule Take 1 capsule (80 mg total) by mouth daily. 04/15/15  Yes Minna Merritts, MD  traMADol (ULTRAM) 50 MG tablet Take 1-2 tablets (50-100 mg total) by mouth every 6 (six) hours as needed for moderate pain. 05/04/15  Yes Reche Dixon, PA-C  acetaminophen (TYLENOL) 500 MG tablet Take 1 tablet (500 mg total) by mouth every 6 (six) hours as needed for mild pain or moderate pain. Patient not taking: Reported on 06/11/2015 01/21/15 01/21/16  Joanne Gavel, MD  losartan (COZAAR) 50 MG tablet Take 1 tablet (50 mg total) by mouth daily. Patient not taking: Reported on 06/11/2015 01/28/15   Minna Merritts, MD  potassium chloride (K-DUR) 10 MEQ tablet Take 1 tablet (10 mEq total) by mouth daily. Patient not taking: Reported on 06/11/2015 01/28/15   Minna Merritts, MD  traZODone (DESYREL) 100 MG tablet Take 1 tablet (100 mg total) by mouth at bedtime as needed for sleep. Patient not taking: Reported on 06/11/2015 09/24/14   Minna Merritts, MD      PHYSICAL EXAMINATION:   VITAL SIGNS: Blood pressure 138/62, pulse 64, temperature 98.6 F (37 C), temperature source Oral, resp. rate 15, height 5\' 2"  (1.575 m), weight 67.132 kg (148 lb), SpO2 98 %.  GENERAL:  70 y.o.-year-old patient lying in the bed with no acute distress.  EYES: Pupils equal, round, reactive to light and accommodation. No scleral icterus. Extraocular muscles intact.  HEENT: Head atraumatic,  normocephalic. Oropharynx and nasopharynx clear.  NECK:  Supple, no jugular venous distention. No thyroid enlargement, no tenderness.  LUNGS: Normal breath sounds bilaterally, no wheezing, rales,rhonchi or crepitation. No use of accessory muscles of respiration.  CARDIOVASCULAR: S1, S2 normal. No murmurs, rubs, or gallops.  ABDOMEN: Soft, nontender, nondistended. Bowel sounds present. No organomegaly or mass.  EXTREMITIES: No pedal edema, cyanosis, or clubbing. Left knee is warm to touch there is no erythema or drainage NEUROLOGIC: Cranial nerves II through XII are intact. Muscle strength 5/5 in all extremities. Sensation intact. Gait not checked.  PSYCHIATRIC: The patient is alert and oriented x 3.  SKIN: No obvious rash, lesion, or ulcer.   LABORATORY PANEL:   CBC  Recent Labs Lab 06/11/15 1520  WBC 5.5  HGB 11.8*  HCT 35.9  PLT 242  MCV 88.1  MCH 29.0  MCHC 32.9  RDW 14.4   ------------------------------------------------------------------------------------------------------------------  Chemistries   Recent Labs Lab 06/11/15 1520  NA 133*  K 4.1  CL 101  CO2 24  GLUCOSE 98  BUN 15  CREATININE 0.90  CALCIUM 9.2   ------------------------------------------------------------------------------------------------------------------ estimated creatinine clearance is 51.5 mL/min (by C-G formula based on Cr of 0.9). ------------------------------------------------------------------------------------------------------------------ No results for input(s): TSH, T4TOTAL, T3FREE, THYROIDAB in the last 72 hours.  Invalid input(s): FREET3   Coagulation profile No results for input(s): INR, PROTIME in the last 168 hours. ------------------------------------------------------------------------------------------------------------------- No results for input(s): DDIMER in the last 72  hours. -------------------------------------------------------------------------------------------------------------------  Cardiac Enzymes No results for input(s): CKMB, TROPONINI, MYOGLOBIN in the last 168 hours.  Invalid input(s): CK ------------------------------------------------------------------------------------------------------------------ Invalid input(s): POCBNP  ---------------------------------------------------------------------------------------------------------------  Urinalysis    Component Value Date/Time   COLORURINE AMBER* 06/11/2015 1520   APPEARANCEUR HAZY* 06/11/2015 1520   LABSPEC 1.025 06/11/2015 1520   PHURINE 5.0 06/11/2015 1520   GLUCOSEU NEGATIVE 06/11/2015 1520   HGBUR NEGATIVE 06/11/2015 1520   BILIRUBINUR 1+* 06/11/2015 1520   KETONESUR TRACE* 06/11/2015 1520   PROTEINUR 30* 06/11/2015 1520   NITRITE NEGATIVE 06/11/2015 1520   LEUKOCYTESUR NEGATIVE 06/11/2015 1520     RADIOLOGY: No results found.  EKG: Orders placed or performed during the hospital encounter of 06/11/15  . ED EKG  . ED EKG    IMPRESSION AND PLAN: Patient is a 70 year old female presents with generalized weakness noted to have V. Tach  1. Ventricular tachycardia we'll check a magnesium level TSH level. The ED physician as artery contacted her primary cardiologist Dr. Candis Musa will see the patient. Will monitor on telemetry for now.  2. Left knee warmth to touch I'll check a sedimentation rate. Will ask her orthopedist to evaluate to see if there is any concern for any infection  3. Lack of appetite will start on Megace patient also has had difficulty with bowel movements will place her on a bowel regimen  4. Atrial fibrillation continue therapy with flecainide and Eliquis.  5. Hypertension we'll continue Inderal  6. Miscellaneous patient already on Eliquis for DVT prophylaxis    All the records are reviewed and case discussed with ED provider. Management plans  discussed with the patient, family and they are in agreement.  CODE STATUS:    Code Status Orders        Start     Ordered   06/11/15 1825  Full code   Continuous     06/11/15 1825    Code Status History    Date Active Date Inactive Code Status Order ID Comments User Context   This patient has a current code status but no historical code status.       TOTAL TIME TAKING CARE OF THIS PATIENT: 38minutes.    Dustin Flock M.D on 06/11/2015 at 6:27 PM  Between 7am to 6pm - Pager - [phone]  After 6pm go to [email or web address] - password [email or web address] [facility]  Office  [phone]  CC: Primary care physician; Hortencia Conradi, MD

## 2015-06-11 NOTE — ED Provider Notes (Signed)
[facility] Emergency Department Provider Note  ____________________________________________  Time seen: Approximately 5:19 PM  I have reviewed the triage vital signs and the nursing notes.   HISTORY  Chief Complaint Weakness    HPI Gina Frazier is a 70 y.o. female with history of atrial fibrillation on flecainide and Eliquis, hypertension, presents for evaluation of 2 weeks of generalized weakness, onset since onset, currently severe, no modifying factors. No chest pain or shortness of breath. No palpitations. No fevers or chills. She was recently treated with Bactrim for urinary tract infection however she continues to feel poorly. She had a total knee replacement on 05/30/2015 reports that her left knee has been healing well however she has had poor by mouth intake since the operation.   Past Medical History  Diagnosis Date  . Hypertension   . Palpitations   . Osteoarthritis   . Chronic back pain   . Hip joint pain     left  . Anxiety   . Family history of adverse reaction to anesthesia     pts daughter had severe vomiting 2014   . Dysrhythmia     A Fib   . Wears dentures     partial upper  . Wears contact lenses   . Atrial fibrillation [facility])     Patient Active Problem List   Diagnosis Date Noted  . V-tach ([place]) 06/11/2015  . Primary osteoarthritis of knee 05/02/2015  . Chest pain 01/28/2015  . Special screening for malignant neoplasms, colon   . Atrial fibrillation ([place]) 05/01/2014  . Snoring 05/01/2014  . Anxiety attack 12/30/2010  . Visit for preventive health examination 12/30/2010  . Hypertension 07/21/2010  . Chronic back pain 07/21/2010  . Vitamin D deficiency 07/21/2010  . SHORTNESS OF BREATH 05/01/2010  . Essential hypertension 04/18/2010  . OSTEOARTHROS UNSPEC GEN/LOC OTH SPEC SITES 04/18/2010  . KNEE PAIN, LEFT 04/18/2010    Past Surgical History  Procedure Laterality Date  . Cholecystectomy    . Tooth extraction    .  Knee arthroscopy Left 06/28/2014    Procedure: ARTHROSCOPY KNEE WITH PARTIALLATERAL MENISECTOMY AND DEBRIDEMENT;  Surgeon: Susa Day, MD;  Location: [facility];  Service: Orthopedics;  Laterality: Left;  . Colonoscopy with propofol N/A 11/05/2014    Procedure: COLONOSCOPY WITH PROPOFOL;  Surgeon: Lucilla Lame, MD;  Location: [place];  Service: Endoscopy;  Laterality: N/A;  . 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 Joint replacement      Current Outpatient Rx  Name  Route  Sig  Dispense  Refill  . apixaban (ELIQUIS) 5 MG TABS tablet   Oral   Take 1 tablet (5 mg total) by mouth 2 (two) times daily.   180 tablet   3   . ergocalciferol (VITAMIN D2) 50000 units capsule   Oral   Take 50,000 Units by mouth every Monday.          . flecainide (TAMBOCOR) 50 MG tablet   Oral   Take 1 tablet (50 mg total) by mouth 2 (two) times daily.   180 tablet   3   . nitroGLYCERIN (NITROSTAT) 0.4 MG SL tablet   Sublingual   Place 1 tablet (0.4 mg total) under the tongue every 5 (five) minutes as needed for chest pain. Call 911 if pain not resolved after 3 pills   15 tablet   1   .  oxyCODONE (OXY IR/ROXICODONE) 5 MG immediate release tablet   Oral   Take 1-2 tablets (5-10 mg total) by mouth every 4 (four) hours as needed for breakthrough pain.   40 tablet   0   . propranolol ER (INDERAL LA) 80 MG 24 hr capsule   Oral   Take 1 capsule (80 mg total) by mouth daily.   90 capsule   3   . traMADol (ULTRAM) 50 MG tablet   Oral   Take 1-2 tablets (50-100 mg total) by mouth every 6 (six) hours as needed for moderate pain.   40 tablet   1   . acetaminophen (TYLENOL) 500 MG tablet   Oral   Take 1 tablet (500 mg total) by mouth every 6 (six) hours as needed for mild pain or moderate pain. Patient not taking: Reported on 06/11/2015   30 tablet   0   .  losartan (COZAAR) 50 MG tablet   Oral   Take 1 tablet (50 mg total) by mouth daily. Patient not taking: Reported on 06/11/2015   90 tablet   3   . potassium chloride (K-DUR) 10 MEQ tablet   Oral   Take 1 tablet (10 mEq total) by mouth daily. Patient not taking: Reported on 06/11/2015   90 tablet   3   . traZODone (DESYREL) 100 MG tablet   Oral   Take 1 tablet (100 mg total) by mouth at bedtime as needed for sleep. Patient not taking: Reported on 06/11/2015   30 tablet   6     Allergies Fish allergy  Family History  Problem Relation Age of Onset  . Heart disease Mother   . Hypertension Mother   . Mental illness Mother   . Diabetes Mother   . Heart disease Father   . Hypertension Sister   . Depression Sister   . Diabetes Sister   . Heart disease Brother   . Hypertension Brother   . Arthritis Daughter   . GER disease Daughter   . Hypothyroidism Daughter     Social History Social History  Substance Use Topics  . Smoking status: Never Smoker   . Smokeless tobacco: Never Used  . Alcohol Use: No    Review of Systems Constitutional: No fever/chills Eyes: No visual changes. ENT: No sore throat. Cardiovascular: Denies chest pain. Respiratory: Denies shortness of breath. Gastrointestinal: No abdominal pain.  No nausea, no vomiting.  No diarrhea.  No constipation. Genitourinary: Negative for dysuria. Musculoskeletal: Negative for back pain. Skin: Negative for rash. Neurological: Negative for headaches, focal weakness or numbness.  10-point ROS otherwise negative.  ____________________________________________   PHYSICAL EXAM:  VITAL SIGNS: ED Triage Vitals  Enc Vitals Group     BP 06/11/15 1509 155/59 mmHg     Pulse Rate 06/11/15 1509 68     Resp 06/11/15 1509 20     Temp 06/11/15 1509 98.8 F (37.1 C)     Temp Source 06/11/15 1509 Oral     SpO2 06/11/15 1509 100 %     Weight 06/11/15 1509 148 lb (67.132 kg)     Height 06/11/15 1509 5\' 2"  (1.575 m)      Head Cir --      Peak Flow --      Pain Score 06/11/15 1509 0     Pain Loc --      Pain Edu? --      Excl. in [place]? --     Constitutional: Alert and oriented. Well appearing  and in no acute distress. Eyes: Conjunctivae are normal. PERRL. EOMI. Head: Atraumatic. Nose: No congestion/rhinnorhea. Mouth/Throat: Mucous membranes are moist.  Oropharynx non-erythematous. Neck: No stridor.  Supple without meningismus. Cardiovascular: Normal rate, regular rhythm. Grossly normal heart sounds.  Good peripheral circulation. Respiratory: Normal respiratory effort.  No retractions. Lungs CTAB. Gastrointestinal: Soft and nontender. No distention. No CVA tenderness. Genitourinary: deferred Musculoskeletal: Left knee appears to be healing well without erythema, warmth, tenderness, excellent flexion and extension is only slightly limited. 2+ left DP pulse. Neurologic:  Normal speech and language. No gross focal neurologic deficits are appreciated.  Skin:  Skin is warm, dry and intact. No rash noted. Psychiatric: Mood and affect are normal. Speech and behavior are normal.  ____________________________________________   LABS (all labs ordered are listed, but only abnormal results are displayed)  Labs Reviewed  BASIC METABOLIC PANEL - Abnormal; Notable for the following:    Sodium 133 (*)    All other components within normal limits  CBC - Abnormal; Notable for the following:    Hemoglobin 11.8 (*)    All other components within normal limits  URINALYSIS COMPLETEWITH MICROSCOPIC ([facility] ONLY) - Abnormal; Notable for the following:    Color, Urine AMBER (*)    APPearance HAZY (*)    Bilirubin Urine 1+ (*)    Ketones, ur TRACE (*)    Protein, ur 30 (*)    Squamous Epithelial / LPF 0-5 (*)    All other components within normal limits  URINE CULTURE  CULTURE, BLOOD (ROUTINE X 2)  CULTURE, BLOOD (ROUTINE X 2)  MAGNESIUM  SEDIMENTATION RATE  CBC  CREATININE, SERUM  CBC  BASIC METABOLIC PANEL   TSH   ____________________________________________  EKG  ED ECG REPORT I, Joanne Gavel, the attending physician, personally viewed and interpreted this ECG.   Date: 06/11/2015  EKG Time: 15:15  Rate: 73  Rhythm: normal sinus rhythm with sinus arrhythmia  Axis: Normal axis.  Intervals:none  ST&T Change: No acute ST elevation. Nonspecific T wave abnormality. LVH.  ____________________________________________  RADIOLOGY  CXR ** ____________________________________________   PROCEDURES  Procedure(s) performed: None  Critical Care performed: No  ____________________________________________   INITIAL IMPRESSION / ASSESSMENT AND PLAN / ED COURSE  Pertinent labs & imaging results that were available during my care of the patient were reviewed by me and considered in my medical decision making (see chart for details).  ris Strosnider is a 70 y.o. female with history of atrial fibrillation on flecainide and Eliquis, hypertension, presents for evaluation of 2 weeks of generalized weakness as well as poor by mouth intake. She feels dehydrated. On exam, she is very well-appearing and in no acute distress. Vital signs are stable, she is afebrile. EKG is generally reassuring. We'll obtain screening labs, urinalysis, give IV fluids and observed briefly in the emergency department. Reassess for disposition.   ----------------------------------------- 5:50 PM on 06/11/2015 -----------------------------------------  At 17:38, the patient appeared to have a 16 beat run of asymptomatic ventricular tachycardia which spontaneously resolved Will check a magnesium level and give magnesium given that the morphology appeared ribbonlike, similar to torsades.  I discussed the case with the patient's cardiologist, Dr. Rockey Situ. I also discussed the case with the hospitalist, Dr. Posey Pronto, for admission at this time. Labs notable for very mild hyponatremia, mild anemia. Urinalysis with 6-30 white blood  cells or leukocytes or nitrites however sample also does not appear to be contaminated so will give IV ceftriaxone for treatment of possibly recurrent urinary tract infection. ____________________________________________  FINAL CLINICAL IMPRESSION(S) / ED DIAGNOSES  Final diagnoses:  V-tach ([place])  Weakness  UTI (lower urinary tract infection)      Joanne Gavel, MD 06/11/15 1843

## 2015-06-11 NOTE — ED Notes (Signed)
Patient presents to the ED with weakness and lack of appetite.  Patient reports feeling weak since she had a total knee replacement on March 16th.  Patient was seen by her doctor recently and diagnosed with a UTI.  Patient reports that her pcp started her on antibiotics and she finished them on Monday but she still feels the same.  Patient states, "I think I'm dehydrated because I don't want to eat or drink water.  Water tastes terrible."  Patient is alert and oriented x 3.  No obvious distress at this time.  Complaining of pelvic pain.

## 2015-06-12 ENCOUNTER — Encounter: Payer: Self-pay | Admitting: Physician Assistant

## 2015-06-12 ENCOUNTER — Observation Stay (HOSPITAL_BASED_OUTPATIENT_CLINIC_OR_DEPARTMENT_OTHER)
Admit: 2015-06-12 | Discharge: 2015-06-12 | Disposition: A | Discharge status: Home / Self Care | Payer: BLUE CROSS/BLUE SHIELD | Attending: Physician Assistant | Admitting: Physician Assistant

## 2015-06-12 DIAGNOSIS — R531 Weakness: Secondary | ICD-10-CM | POA: Diagnosis not present

## 2015-06-12 DIAGNOSIS — R002 Palpitations: Secondary | ICD-10-CM | POA: Diagnosis not present

## 2015-06-12 DIAGNOSIS — I499 Cardiac arrhythmia, unspecified: Secondary | ICD-10-CM

## 2015-06-12 DIAGNOSIS — R634 Abnormal weight loss: Secondary | ICD-10-CM | POA: Diagnosis not present

## 2015-06-12 DIAGNOSIS — I480 Paroxysmal atrial fibrillation: Secondary | ICD-10-CM | POA: Diagnosis not present

## 2015-06-12 LAB — CBC
HCT: 31.7 % — ABNORMAL LOW (ref 35.0–47.0)
Hemoglobin: 10.5 g/dL — ABNORMAL LOW (ref 12.0–16.0)
MCH: 29.5 pg (ref 26.0–34.0)
MCHC: 33.1 g/dL (ref 32.0–36.0)
MCV: 89.4 fL (ref 80.0–100.0)
PLATELETS: 193 10*3/uL (ref 150–440)
RBC: 3.55 MIL/uL — AB (ref 3.80–5.20)
RDW: 14.3 % (ref 11.5–14.5)
WBC: 5.4 10*3/uL (ref 3.6–11.0)

## 2015-06-12 LAB — BASIC METABOLIC PANEL WITH GFR
Anion gap: 6 (ref 5–15)
BUN: 17 mg/dL (ref 6–20)
CO2: 26 mmol/L (ref 22–32)
Calcium: 8.5 mg/dL — ABNORMAL LOW (ref 8.9–10.3)
Chloride: 103 mmol/L (ref 101–111)
Creatinine, Ser: 0.79 mg/dL (ref 0.44–1.00)
GFR calc Af Amer: >60 mL/min
GFR calc non Af Amer: >60 mL/min
Glucose, Bld: 132 mg/dL — ABNORMAL HIGH (ref 65–99)
Potassium: 3.8 mmol/L (ref 3.5–5.1)
Sodium: 135 mmol/L (ref 135–145)

## 2015-06-12 LAB — TROPONIN I
Troponin I: <0.03 ng/mL
Troponin I: <0.03 ng/mL (ref <0.031)

## 2015-06-12 LAB — ECHOCARDIOGRAM COMPLETE
Height: 62 in
Weight: 2368 [oz_av]

## 2015-06-12 MED ORDER — OXYCODONE HCL 5 MG PO TABS: 5.0000 mg | ORAL_TABLET | Freq: Four times a day (QID) | ORAL | Status: DC | PRN

## 2015-06-12 MED ORDER — ACETAMINOPHEN 500 MG PO TABS: 500.0000 mg | ORAL_TABLET | Freq: Four times a day (QID) | ORAL | Status: AC | PRN

## 2015-06-12 MED ORDER — DOCUSATE SODIUM 100 MG PO CAPS: 100.0000 mg | ORAL_CAPSULE | Freq: Two times a day (BID) | ORAL | Status: DC

## 2015-06-12 MED ORDER — TRAMADOL HCL 50 MG PO TABS: 50.0000 mg | ORAL_TABLET | Freq: Four times a day (QID) | ORAL | Status: DC | PRN

## 2015-06-12 NOTE — Consult Note (Signed)
Cardiology Consultation Note  Patient ID: Gina Frazier, MRN: [id number], DOB/AGE: 1946-01-29 70 y.o. Admit date: 06/11/2015   Date of Consult: 06/12/2015 Primary Physician: Hortencia Conradi, MD Primary Cardiologist: Dr. Rockey Situ, MD  Chief Complaint: Weakness Reason for Consult: NSVT  HPI: 70 y.o. female with h/o PAF initially diagnosed 04/28/2014 on Eliquis, HTN, and anxiety who presented to [facility] on 3/28 with  Generalized weakness and chills. In the ED she was found to have 16 beats of WCT on telemetry.  Since she underwent left knee surgery in February she has undergone a significant decline. She rarely eats meals and has gone from a pre-surgery weight of 162 pounds to a current weight of 148 pounds. At times she is too weak to continue with her PT or even get out of bed. She denies any significant lower extremity edema, though continues to note swelling of the left knee. She has stable 2-pillow orthopnea. She was seen by her PCP initially and treated for a UTI, though her symptoms of weakness and generalized fatigue persisted. She denies any chest pain or tachy-palpitations. At times her BP has been too low for her to take her "green pill" for her Afib.  Echo from [facility] admission 04/2014 in the setting of new onset Afib showed EF 60-65%, normal RV size and systolic function, mild to moderate TR, normal RVSP.  Upon the patient's arrival to [facility] they were found to have troponin negative x 2, K+ 4.1-->3.8, Mg++ 2.3, TSH normal, hgb 11.8-->10.5, sed rate 48 mm/hr. ECG showed NSR with sinus arrhythmia, 73 bpm, TWI III, aVF, V3, V4, LVH, poor R wave progression, CXR showed no active disease. Telemetry showed 16 beats of WCT in the ED, none since arriving on 2A.   Past Medical History  Diagnosis Date  . Hypertension   . Palpitations   . Osteoarthritis   . Chronic back pain   . Hip joint pain     left  . Anxiety   . Family history of adverse reaction to anesthesia     pts daughter had severe  vomiting 2014   . Dysrhythmia     A Fib   . Wears dentures     partial upper  . Wears contact lenses   . Atrial fibrillation ([place])       Most Recent Cardiac Studies: Echo 04/2014: EF 60-65%, normal RV size and systolic function, mild to moderate TR, normal RVSP.   Surgical History:  Past Surgical History  Procedure Laterality Date  . Cholecystectomy    . Tooth extraction    . Knee arthroscopy Left 06/28/2014    Procedure: ARTHROSCOPY KNEE WITH PARTIALLATERAL MENISECTOMY AND DEBRIDEMENT;  Surgeon: Susa Day, MD;  Location: [facility];  Service: Orthopedics;  Laterality: Left;  . Colonoscopy with propofol N/A 11/05/2014    Procedure: COLONOSCOPY WITH PROPOFOL;  Surgeon: Lucilla Lame, MD;  Location: [place];  Service: Endoscopy;  Laterality: N/A;  . 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 Joint replacement       Home Meds: Prior to Admission medications   Medication Sig Start Date End Date Taking? Authorizing Provider  apixaban (ELIQUIS) 5 MG TABS tablet Take 1 tablet (5 mg total) by mouth 2 (two) times daily. 01/28/15  Yes Minna Merritts, MD  ergocalciferol (VITAMIN D2) 50000 units capsule Take 50,000 Units by mouth every Monday.    Yes  Historical Provider, MD  flecainide (TAMBOCOR) 50 MG tablet Take 1 tablet (50 mg total) by mouth 2 (two) times daily. 01/28/15  Yes Minna Merritts, MD  nitroGLYCERIN (NITROSTAT) 0.4 MG SL tablet Place 1 tablet (0.4 mg total) under the tongue every 5 (five) minutes as needed for chest pain. Call 911 if pain not resolved after 3 pills 01/28/15 05/29/16 Yes Minna Merritts, MD  oxyCODONE (OXY IR/ROXICODONE) 5 MG immediate release tablet Take 1-2 tablets (5-10 mg total) by mouth every 4 (four) hours as needed for breakthrough pain. 05/04/15  Yes Reche Dixon, PA-C  propranolol ER (INDERAL LA) 80 MG 24 hr capsule Take  1 capsule (80 mg total) by mouth daily. 04/15/15  Yes Minna Merritts, MD  traMADol (ULTRAM) 50 MG tablet Take 1-2 tablets (50-100 mg total) by mouth every 6 (six) hours as needed for moderate pain. 05/04/15  Yes Reche Dixon, PA-C  acetaminophen (TYLENOL) 500 MG tablet Take 1 tablet (500 mg total) by mouth every 6 (six) hours as needed for mild pain or moderate pain. Patient not taking: Reported on 06/11/2015 01/21/15 01/21/16  Joanne Gavel, MD  losartan (COZAAR) 50 MG tablet Take 1 tablet (50 mg total) by mouth daily. Patient not taking: Reported on 06/11/2015 01/28/15   Minna Merritts, MD  potassium chloride (K-DUR) 10 MEQ tablet Take 1 tablet (10 mEq total) by mouth daily. Patient not taking: Reported on 06/11/2015 01/28/15   Minna Merritts, MD  traZODone (DESYREL) 100 MG tablet Take 1 tablet (100 mg total) by mouth at bedtime as needed for sleep. Patient not taking: Reported on 06/11/2015 09/24/14   Minna Merritts, MD    Inpatient Medications:  . apixaban  5 mg Oral BID  . docusate sodium  100 mg Oral BID  . [START ON 06/17/2015] ergocalciferol  50,000 Units Oral Q Mon  . flecainide  50 mg Oral BID  . propranolol ER  80 mg Oral Daily  . senna  1 tablet Oral BID  . sodium chloride flush  3 mL Intravenous Q12H   . sodium chloride 100 mL/hr at 06/12/15 0555    Allergies:  Allergies  Allergen Reactions  . Fish Allergy     REACTION: vomitting, swelling shell fish    Social History   Social History  . Marital Status: Single    Spouse Name: N/A  . Number of Children: N/A  . Years of Education: N/A   Occupational History  . Not on file.   Social History Main Topics  . Smoking status: Never Smoker   . Smokeless tobacco: Never Used  . Alcohol Use: No  . Drug Use: No  . Sexual Activity: Not on file   Other Topics Concern  . Not on file   Social History Narrative     Family History  Problem Relation Age of Onset  . Heart disease Mother   . Hypertension Mother   . Mental  illness Mother   . Diabetes Mother   . Heart disease Father   . Hypertension Sister   . Depression Sister   . Diabetes Sister   . Heart disease Brother   . Hypertension Brother   . Arthritis Daughter   . GER disease Daughter   . Hypothyroidism Daughter      Review of Systems: Review of Systems  Constitutional: Positive for weight loss and malaise/fatigue. Negative for fever, chills and diaphoresis.       Poor appetite  Weight loss from 162-->148 since  her surgery in February   HENT: Negative for congestion.   Eyes: Negative for discharge and redness.  Respiratory: Negative for cough, hemoptysis, sputum production, shortness of breath and wheezing.   Cardiovascular: Positive for orthopnea and leg swelling. Negative for chest pain, palpitations, claudication and PND.       Stable 2-pillow orthopnea Left knee remains swollen s/p surgery in February    Gastrointestinal: Negative for nausea, vomiting and abdominal pain.  Musculoskeletal: Negative for myalgias and falls.  Skin: Negative for rash.  Neurological: Positive for weakness. Negative for dizziness, tingling, tremors, sensory change, speech change, focal weakness and loss of consciousness.  Endo/Heme/Allergies: Does not bruise/bleed easily.  Psychiatric/Behavioral: Negative for substance abuse. The patient is not nervous/anxious.   All other systems reviewed and are negative.   Labs:  Recent Labs  06/11/15 2109 06/12/15 0318  TROPONINI <0.03 <0.03   Lab Results  Component Value Date   WBC 5.4 06/12/2015   HGB 10.5* 06/12/2015   HCT 31.7* 06/12/2015   MCV 89.4 06/12/2015   PLT 193 06/12/2015    Recent Labs Lab 06/12/15 0318  NA 135  K 3.8  CL 103  CO2 26  BUN 17  CREATININE 0.79  CALCIUM 8.5*  GLUCOSE 132*   Lab Results  Component Value Date   CHOL 201* 09/26/2013   HDL 85.10 09/26/2013   LDLCALC 102* 09/26/2013   TRIG 69.0 09/26/2013   No results found for: DDIMER  Radiology/Studies:  Dg Chest  Portable 1 View  06/11/2015  CLINICAL DATA:  Weakness and lack of appetite since March 16 EXAM: PORTABLE CHEST 1 VIEW COMPARISON:  01/21/2015 FINDINGS: The heart size and vascular pattern are normal. Mild aortic arch calcifications stable. Lungs clear. No pleural effusion. IMPRESSION: No active disease. Electronically Signed   By: Skipper Cliche M.D.   On: 06/11/2015 19:18    EKG: NSR with sinus arrhythmia, 73 bpm, TWI III, aVF, V3, V4, LVH, poor R wave progression  Weights: Filed Weights   06/11/15 1509  Weight: 148 lb (67.132 kg)     Physical Exam: Blood pressure 123/49, pulse 66, temperature 98.5 F (36.9 C), temperature source Oral, resp. rate 17, height 5\' 2"  (1.575 m), weight 148 lb (67.132 kg), SpO2 98 %. Body mass index is 27.06 kg/(m^2). General: Well developed, well nourished, in no acute distress. Head: Normocephalic, atraumatic, sclera non-icteric, no xanthomas, nares are without discharge.  Neck: Negative for carotid bruits. JVD not elevated. Lungs: Clear bilaterally to auscultation without wheezes, rales, or rhonchi. Breathing is unlabored. Heart: RRR with S1 S2. No murmurs, rubs, or gallops appreciated. Abdomen: Soft, non-tender, non-distended with normoactive bowel sounds. No hepatomegaly. No rebound/guarding. No obvious abdominal masses. Msk:  Strength and tone appear normal for age. Extremities: No clubbing or cyanosis. Trace swelling along left knee, otherwise no edema.  Distal pedal pulses are 2+ and equal bilaterally. Neuro: Alert and oriented X 3. No facial asymmetry. No focal deficit. Moves all extremities spontaneously. Psych:  Responds to questions appropriately with a normal affect.    Assessment and Plan:   1. WCT: -Last known EF normal in February 2016, normal magnesium, potassium, ans thyroid function  -Check echo to evaluation LV systolic function, wall motion, and right-sided pressure -Titrate up beta blocker as BP allows -Consider amiodarone if  recurs  -On flecainide for Afib, has normal LV systolic function with normal wall motion - schedule nuclear stress test to evaluate for ischemic heart disease. No prior stress testing. If abnormal flecainide would need  to be discontinued.   2. Generalized weakness/malnutrition/weight loss: -Add albumin level -Question if playing a role in the above  3. PAF: -Has remained in sinus rhythm -No evidence of Ashman's phenomenon -On propranolol, could change to Lopressor  -Flecainide as above, may need to discontinue based on the above stress testing results, defer to primary cardiologist  -Schedule echo as above -Eliquis 5 mg bid  -CHADS2VASc at least 3 (HTN, age x 34, female)  4. Malnutrition/weight loss: -Check albumin -Consider nutrition consult -PT  5. Elevated sed rate: -Nonspecific  -Possibly in the setting of the above  6. HTN: -As above  Signed, [name], PA-C Pager: [phone] 06/12/2015, 8:36 AM

## 2015-06-12 NOTE — Discharge Summary (Signed)
Gina Frazier NAME: Gina Frazier    MR#:  [id number]  DATE OF BIRTH:  03-Nov-1945  DATE OF ADMISSION:  06/11/2015 ADMITTING PHYSICIAN: Dustin Flock, MD  DATE OF DISCHARGE: 06/12/15 PRIMARY CARE PHYSICIAN: Hortencia Conradi, MD    ADMISSION DIAGNOSIS:  V-tach ([place]) [I47.2] Weakness [R53.1] UTI (lower urinary tract infection) [N39.0]  DISCHARGE DIAGNOSIS:  Active Problems:   V-tach ([facility])   Arrhythmia   Weakness   Weight loss   PAF (paroxysmal atrial fibrillation) ([place])   SECONDARY DIAGNOSIS:   Past Medical History  Diagnosis Date  . Hypertension   . Palpitations   . Osteoarthritis   . Chronic back pain   . Hip joint pain     left  . Anxiety   . Family history of adverse reaction to anesthesia     pts daughter had severe vomiting 2014   . Wears dentures     partial upper  . Wears contact lenses   . PAF (paroxysmal atrial fibrillation) ([place])     a. initially diagnosed 04/2014; b. on Eliquis; c. CHADS2VASc = 3    HOSPITAL COURSE:  Patient is a 70 year old female presents with generalized weakness noted to have V. Tach  1. Ventricular tachycardia V. tach was thought to be artifact by cardiology Dr. Rockey Situ, recommend outpatient follow-up Not recommending any stress test at this time. Echocardiogram with normal ejection fraction 55% TSH level was normal Magnesium is also normal at 2.3  acute MI ruled out with negative troponins  2.. Left knee warmth to touch Sedimentation rate is at 48. Patient prefers outpatient orthopedic follow-up   3. Lack of appetite with a significant weight loss following total knee replacement This is most likely from pain medications including narcotics intake. Recommended patient to take them as needed basis only if she is in pain. She understands our recommendations Primary care physician is doing failure to thrive workup as reported by the patient, patient wants to continue  with her primary care physician for further evaluation  4. Atrial fibrillation continue therapy with flecainide and Eliquis. Currently rate controlled  5. Hypertension we'll continue Inderal Discontinued Cozaar as blood pressure is soft, Dr. Candis Musa will further follow-up  6. Miscellaneous patient already on Eliquis for DVT prophylaxis    DISCHARGE CONDITIONS:   fair  CONSULTS OBTAINED:  Treatment Team:  Minna Merritts, MD Hessie Knows, MD   PROCEDURES none  DRUG ALLERGIES:   Allergies  Allergen Reactions  . Fish Allergy     REACTION: vomitting, swelling shell fish    DISCHARGE MEDICATIONS:   Discharge Medication List as of 06/12/2015  3:58 PM    START taking these medications   Details  docusate sodium (COLACE) 100 MG capsule Take 1 capsule (100 mg total) by mouth 2 (two) times daily., Starting 06/12/2015, Until Discontinued, Normal      CONTINUE these medications which have CHANGED   Details  acetaminophen (TYLENOL) 500 MG tablet Take 1 tablet (500 mg total) by mouth every 6 (six) hours as needed for mild pain., Starting 06/12/2015, Until Thu 06/11/16, Print    oxyCODONE (OXY IR/ROXICODONE) 5 MG immediate release tablet Take 1 tablet (5 mg total) by mouth every 6 (six) hours as needed for severe pain., Starting 06/12/2015, Until Discontinued, Print    traMADol (ULTRAM) 50 MG tablet Take 1-2 tablets (50-100 mg total) by mouth every 6 (six) hours as needed for moderate pain., Starting 06/12/2015, Until Discontinued, Print  CONTINUE these medications which have NOT CHANGED   Details  apixaban (ELIQUIS) 5 MG TABS tablet Take 1 tablet (5 mg total) by mouth 2 (two) times daily., Starting 01/28/2015, Until Discontinued, Normal    ergocalciferol (VITAMIN D2) 50000 units capsule Take 50,000 Units by mouth every Monday. , Until Discontinued, Historical Med    flecainide (TAMBOCOR) 50 MG tablet Take 1 tablet (50 mg total) by mouth 2 (two) times daily., Starting 01/28/2015,  Until Discontinued, Normal    nitroGLYCERIN (NITROSTAT) 0.4 MG SL tablet Place 1 tablet (0.4 mg total) under the tongue every 5 (five) minutes as needed for chest pain. Call 911 if pain not resolved after 3 pills, Starting 01/28/2015, Until Fri 05/29/16, Normal    propranolol ER (INDERAL LA) 80 MG 24 hr capsule Take 1 capsule (80 mg total) by mouth daily., Starting 04/15/2015, Until Discontinued, Normal      STOP taking these medications     losartan (COZAAR) 50 MG tablet      potassium chloride (K-DUR) 10 MEQ tablet      traZODone (DESYREL) 100 MG tablet          DISCHARGE INSTRUCTIONS:   ACTIVITY AS TOLERATED  Diet- cardiac  F/u with PCP in a week Follow-up with cardiology Dr. Rockey Situ in a week   DIET:  Cardiac diet  DISCHARGE CONDITION:  Fair  ACTIVITY:  Activity as tolerated  OXYGEN:  Home Oxygen: No.   Oxygen Delivery: room air  DISCHARGE LOCATION:  home   If you experience worsening of your admission symptoms, develop shortness of breath, life threatening emergency, suicidal or homicidal thoughts you must seek medical attention immediately by calling 911 or calling your MD immediately  if symptoms less severe.  You Must read complete instructions/literature along with all the possible adverse reactions/side effects for all the Medicines you take and that have been prescribed to you. Take any new Medicines after you have completely understood and accpet all the possible adverse reactions/side effects.   Please note  You were cared for by a hospitalist during your hospital stay. If you have any questions about your discharge medications or the care you received while you were in the hospital after you are discharged, you can call the unit and asked to speak with the hospitalist on call if the hospitalist that took care of you is not available. Once you are discharged, your primary care physician will handle any further medical issues. Please note that NO REFILLS for  any discharge medications will be authorized once you are discharged, as it is imperative that you return to your primary care physician (or establish a relationship with a primary care physician if you do not have one) for your aftercare needs so that they can reassess your need for medications and monitor your lab values.     Today  Chief Complaint  Patient presents with  . Weakness   Patient is feeling fine. Denies any chest pain or shortness of breath. Feels comfortable to go home. Denies any palpitations. She prefers following up with her primary care physician for weight loss evaluation and outpatient orthopedics regarding her knee  ROS:  CONSTITUTIONAL: Denies fevers, chills. Denies any fatigue, weakness.  EYES: Denies blurry vision, double vision, eye pain. EARS, NOSE, THROAT: Denies tinnitus, ear pain, hearing loss. RESPIRATORY: Denies cough, wheeze, shortness of breath.  CARDIOVASCULAR: Denies chest pain, palpitations, edema.  GASTROINTESTINAL: Denies nausea, vomiting, diarrhea, abdominal pain. Denies bright red blood per rectum. GENITOURINARY: Denies dysuria, hematuria. ENDOCRINE: Denies  nocturia or thyroid problems. HEMATOLOGIC AND LYMPHATIC: Denies easy bruising or bleeding. SKIN: Denies rash or lesion. MUSCULOSKELETAL: Total knee replacement scar is healing well .Denies pain in neck, back, shoulder, hips or arthritic symptoms.  NEUROLOGIC: Denies paralysis, paresthesias.  PSYCHIATRIC: Denies anxiety or depressive symptoms.   VITAL SIGNS:  Blood pressure 121/48, pulse 63, temperature 98.6 F (37 C), temperature source Oral, resp. rate 18, height 5\' 2"  (1.575 m), weight 67.132 kg (148 lb), SpO2 96 %.  I/O:  No intake or output data in the 24 hours ending 06/14/15 1107  PHYSICAL EXAMINATION:  GENERAL:  70 y.o.-year-old patient lying in the bed with no acute distress.  EYES: Pupils equal, round, reactive to light and accommodation. No scleral icterus. Extraocular  muscles intact.  HEENT: Head atraumatic, normocephalic. Oropharynx and nasopharynx clear.  NECK:  Supple, no jugular venous distention. No thyroid enlargement, no tenderness.  LUNGS: Normal breath sounds bilaterally, no wheezing, rales,rhonchi or crepitation. No use of accessory muscles of respiration.  CARDIOVASCULAR: S1, S2 normal. No murmurs, rubs, or gallops.  ABDOMEN: Soft, non-tender, non-distended. Bowel sounds present. No organomegaly or mass.  EXTREMITIES: No pedal edema, cyanosis, or clubbing. Total knee replacement scar is healing well, no signs of infection noticed NEUROLOGIC: Cranial nerves II through XII are intact. Muscle strength 5/5 in all extremities. Sensation intact. Gait not checked.  PSYCHIATRIC: The patient is alert and oriented x 3.  SKIN: No obvious rash, lesion, or ulcer.   DATA REVIEW:   CBC  Recent Labs Lab 06/12/15 0318  WBC 5.4  HGB 10.5*  HCT 31.7*  PLT 193    Chemistries   Recent Labs Lab 06/11/15 1520 06/12/15 0318  NA 133* 135  K 4.1 3.8  CL 101 103  CO2 24 26  GLUCOSE 98 132*  BUN 15 17  CREATININE 0.90 0.79  CALCIUM 9.2 8.5*  MG 2.3  --     Cardiac Enzymes  Recent Labs Lab 06/12/15 1100  TROPONINI <0.03    Microbiology Results  Results for orders placed or performed during the hospital encounter of 06/11/15  Urine culture     Status: None   Collection Time: 06/11/15  3:20 PM  Result Value Ref Range Status   Specimen Description URINE, CLEAN CATCH  Final   Special Requests NONE  Final   Culture NO GROWTH 2 DAYS  Final   Report Status 06/14/2015 FINAL  Final  Blood culture (routine x 2)     Status: None (Preliminary result)   Collection Time: 06/11/15  6:21 PM  Result Value Ref Range Status   Specimen Description BLOOD RIGHT ARM  Final   Special Requests BAA,ANA,AER,8ML  Final   Culture NO GROWTH 3 DAYS  Final   Report Status PENDING  Incomplete  Blood culture (routine x 2)     Status: None (Preliminary result)    Collection Time: 06/11/15  6:21 PM  Result Value Ref Range Status   Specimen Description BLOOD LEFT ARM  Final   Special Requests BAA,ANA,AER,8ML  Final   Culture NO GROWTH 3 DAYS  Final   Report Status PENDING  Incomplete    RADIOLOGY:  Dg Chest Portable 1 View  06/11/2015  CLINICAL DATA:  Weakness and lack of appetite since March 16 EXAM: PORTABLE CHEST 1 VIEW COMPARISON:  01/21/2015 FINDINGS: The heart size and vascular pattern are normal. Mild aortic arch calcifications stable. Lungs clear. No pleural effusion. IMPRESSION: No active disease. Electronically Signed   By: Elodia Florence.D.  On: 06/11/2015 19:18    EKG:   Orders placed or performed during the hospital encounter of 06/11/15  . ED EKG  . ED EKG  . EKG      Management plans discussed with the patient, family and they are in agreement.  CODE STATUS:     Code Status Orders        Start     Ordered   06/11/15 1825  Full code   Continuous     06/11/15 1825    Code Status History    Date Active Date Inactive Code Status Order ID Comments User Context   This patient has a current code status but no historical code status.      TOTAL TIME TAKING CARE OF THIS PATIENT: 45  minutes.    @[facility] @  on 06/14/2015 at 11:07 AM  Between 7am to 6pm - Pager - [phone]  After 6pm go to [email or web address] - password [email or web address] [facility]  Office  [phone]  CC: Primary care physician; Hortencia Conradi, MD

## 2015-06-12 NOTE — Care Management (Signed)
Patient placed in observation after presenting with weakness.  While in ED had nonsustained run of Vtach.  She had a recent knee replacement in February and had originally received home health services through [facility] and now has been transitioned to outpatient.  She denies that there are any issues interfering with her ability to continue therapy in the outpatient setting

## 2015-06-12 NOTE — Progress Notes (Signed)
*  PRELIMINARY RESULTS* Echocardiogram 2D Echocardiogram has been performed.  Gina Frazier 06/12/2015, 10:33 AM

## 2015-06-12 NOTE — Discharge Instructions (Signed)
ACTIVITY AS TOLERATED  Diet- cardiac  F/u with PCP in a week Follow-up with cardiology Dr. Rockey Situ in a week

## 2015-06-12 NOTE — Progress Notes (Signed)
Patient discharged via wheelchair and private vehicle. IV removed and catheter intact. All discharge instructions given and patient verbalizes understanding. Tele removed and returned. No prescriptions given to patient No distress noted.   

## 2015-06-12 NOTE — Care Management Obs Status (Signed)
[place] NOTIFICATION   Patient Details  Name: Gina Frazier MRN: [id number] Date of Birth: 01-Oct-1945   Medicare Observation Status Notification Given:  No  BCBS (not a medicare advantage plan) is primary payor.  Patient has medicare A as secondary policy- no part B.   Katrina Stack, RN 06/12/2015, 11:29 AM

## 2015-06-13 ENCOUNTER — Ambulatory Visit: Payer: BLUE CROSS/BLUE SHIELD

## 2015-06-14 LAB — URINE CULTURE: Culture: NO GROWTH

## 2015-06-16 LAB — CULTURE, BLOOD (ROUTINE X 2)
CULTURE: NO GROWTH
Culture: NO GROWTH

## 2015-06-27 ENCOUNTER — Other Ambulatory Visit: Payer: Self-pay | Admitting: Orthopedic Surgery

## 2015-06-27 DIAGNOSIS — M545 Low back pain: Secondary | ICD-10-CM

## 2015-07-05 ENCOUNTER — Ambulatory Visit
Admission: RE | Admit: 2015-07-05 | Discharge: 2015-07-05 | Disposition: A | Discharge status: Home / Self Care | Payer: BLUE CROSS/BLUE SHIELD | Source: Ambulatory Visit | Attending: Orthopedic Surgery | Admitting: Orthopedic Surgery

## 2015-07-05 DIAGNOSIS — M545 Low back pain: Secondary | ICD-10-CM | POA: Insufficient documentation

## 2015-08-06 ENCOUNTER — Telehealth: Payer: Self-pay | Admitting: Cardiovascular Disease

## 2015-08-06 NOTE — Telephone Encounter (Signed)
Patient is has upcoming spinal injection procedure and wants to know if she should stop taking eliquis prior to procedure.  If so When.  Please call.

## 2015-08-06 NOTE — Telephone Encounter (Signed)
Spoke w/ pt.  She reports that she is having spinal injection on Thursday w/ Dr. Rudene Christians. This was scheduled yesterday, so she was not given much notice.  She needs cardiac clearance to proceed, as swell as instructions on holding Eliquis. Advised her that I will make Dr. Rockey Situ aware and call her back w/ his recommendation.

## 2015-08-08 NOTE — Telephone Encounter (Signed)
Very late notice to give her instructions Ideally would hold eliquis for 2, preferably 3 days prior to the procedure Acceptable risk to have the procedure

## 2015-08-08 NOTE — Telephone Encounter (Signed)
Patient had injection today and per Dr. Sharlet Salina they did not hold the eliquis prior to or after the procedure per his recommendations. Let her know I would enter a note so we had this information in her chart and she had no further questions at this time.

## 2015-08-30 ENCOUNTER — Encounter: Payer: Self-pay | Admitting: Cardiovascular Disease

## 2015-08-30 ENCOUNTER — Ambulatory Visit (INDEPENDENT_AMBULATORY_CARE_PROVIDER_SITE_OTHER): Payer: BLUE CROSS/BLUE SHIELD | Admitting: Cardiovascular Disease

## 2015-08-30 VITALS — BP 140/78 | HR 61 | Ht 62.0 in | Wt 159.5 lb

## 2015-08-30 DIAGNOSIS — I10 Essential (primary) hypertension: Secondary | ICD-10-CM | POA: Diagnosis not present

## 2015-08-30 DIAGNOSIS — R079 Chest pain, unspecified: Secondary | ICD-10-CM

## 2015-08-30 DIAGNOSIS — I480 Paroxysmal atrial fibrillation: Secondary | ICD-10-CM

## 2015-08-30 MED ORDER — PROPRANOLOL HCL ER 80 MG PO CP24: 80.0000 mg | ORAL_CAPSULE | Freq: Every day | ORAL | Status: DC

## 2015-08-30 MED ORDER — APIXABAN 5 MG PO TABS: 5.0000 mg | ORAL_TABLET | Freq: Two times a day (BID) | ORAL | Status: DC

## 2015-08-30 MED ORDER — FLECAINIDE ACETATE 50 MG PO TABS: 50.0000 mg | ORAL_TABLET | Freq: Two times a day (BID) | ORAL | Status: DC

## 2015-08-30 NOTE — Patient Instructions (Signed)
You are doing well. No medication changes were made.  Please call us if you have new issues that need to be addressed before your next appt.  Your physician wants you to follow-up in: 6 months.  You will receive a reminder letter in the mail two months in advance. If you don't receive a letter, please call our office to schedule the follow-up appointment.   

## 2015-08-30 NOTE — Progress Notes (Signed)
Patient ID: Gina Frazier, female   DOB: [birth year], 70 y.o.   MRN: [id number] Cardiology Office Note  Date:  08/30/2015   ID:  Gina Frazier, DOB 1945-07-04, MRN [id number]  PCP:  Gina Conradi, MD   Chief Complaint  Patient presents with  . other    1 yr f/u. Meds reviewed verbally.    HPI:  Gina Frazier is a 70 year old woman seen several years ago in 2012 for acute hypertension in the dentist chair, recently presenting to the hospital with new onset atrial fibrillation on 04/28/2014, started on Cardizem, Lopressor converting to normal sinus rhythm.. Prior episode 6 months prior. She was started on anticoagulation with eliquis 5 mg by mouth twice a day, also started on flecainide 50 mid grams twice a day, continued on beta blocker. She presents today for follow-up of her atrial fibrillation  Knee replacement February 2017 Back in the hospital March 2017 with weakness and chills, had been treated for urinary tract infection, Dehydrated In the emergency room had 14 beat run of Vtach (this was in fact artifact on further review) Echocardiogram showed normal ejection fraction 06/12/2015  In follow-up today she denies any significant chest pain. No significant shortness breath, no tachycardia concerning for atrial fibrillation Having trouble with her back and her knees, walking with a cane, scheduled to see orthopedics today discussed her lab work with her, slow climb in her cholesterol over the past 5 years 170s, now up to low 200  She reports blood pressures typically well controlled   EKG on today's visit shows normal sinus rhythm with rate 61 bpm, Q waves in 1 and aVL  Other past medical history Echocardiogram from the hospital shows ejection fraction 60-65%, otherwise normal study  PMH:   has a past medical history of Hypertension; Palpitations; Osteoarthritis; Chronic back pain; Hip joint pain; Anxiety; Family history of adverse reaction to anesthesia; Wears dentures;  Wears contact lenses; and PAF (paroxysmal atrial fibrillation) ([place]).  PSH:    Past Surgical History  Procedure Laterality Date  . Cholecystectomy    . Tooth extraction    . Knee arthroscopy Left 06/28/2014    Procedure: ARTHROSCOPY KNEE WITH PARTIALLATERAL MENISECTOMY AND DEBRIDEMENT;  Surgeon: Susa Day, MD;  Location: [facility];  Service: Orthopedics;  Laterality: Left;  . Colonoscopy with propofol N/A 11/05/2014    Procedure: COLONOSCOPY WITH PROPOFOL;  Surgeon: Lucilla Lame, MD;  Location: [place];  Service: Endoscopy;  Laterality: N/A;  . 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 Joint replacement      Current Outpatient Prescriptions  Medication Sig Dispense Refill  . acetaminophen (TYLENOL) 500 MG tablet Take 1 tablet (500 mg total) by mouth every 6 (six) hours as needed for mild pain. 30 tablet 0  . apixaban (ELIQUIS) 5 MG TABS tablet Take 1 tablet (5 mg total) by mouth 2 (two) times daily. 180 tablet 3  . ergocalciferol (VITAMIN D2) 50000 units capsule Take 50,000 Units by mouth every Monday.     . flecainide (TAMBOCOR) 50 MG tablet Take 1 tablet (50 mg total) by mouth 2 (two) times daily. 180 tablet 3  . nitroGLYCERIN (NITROSTAT) 0.4 MG SL tablet Place 1 tablet (0.4 mg total) under the tongue every 5 (five) minutes as needed for chest pain. Call 911 if pain not resolved after 3 pills 15 tablet 1  . propranolol ER (INDERAL LA) 80  MG 24 hr capsule Take 1 capsule (80 mg total) by mouth daily. 90 capsule 3   No current facility-administered medications for this visit.     Allergies:   Fish allergy   Social History:  The patient  reports that she has never smoked. She has never used smokeless tobacco. She reports that she does not drink alcohol or use illicit drugs.   Family History:   family history includes Arthritis in her daughter;  Depression in her sister; Diabetes in her mother and sister; GER disease in her daughter; Heart disease in her brother, father, and mother; Hypertension in her brother, mother, and sister; Hypothyroidism in her daughter; Mental illness in her mother.    Review of Systems: Review of Systems  Constitutional: Negative.   Respiratory: Negative.   Cardiovascular: Negative.   Gastrointestinal: Negative.   Musculoskeletal: Positive for back pain and joint pain.  Neurological: Negative.   Psychiatric/Behavioral: Negative.   All other systems reviewed and are negative.    PHYSICAL EXAM: VS:  BP 140/78 mmHg  Pulse 61  Ht 5\' 2"  (1.575 m)  Wt 159 lb 8 oz (72.349 kg)  BMI 29.17 kg/m2 , BMI Body mass index is 29.17 kg/(m^2). GEN: Well nourished, well developed, in no acute distress HEENT: normal Neck: no JVD, carotid bruits, or masses Cardiac: RRR; no murmurs, rubs, or gallops,no edema  Respiratory:  clear to auscultation bilaterally, normal work of breathing GI: soft, nontender, nondistended, + BS MS: no deformity or atrophy Skin: warm and dry, no rash Neuro:  Strength and sensation are intact Psych: euthymic mood, full affect    Recent Labs: 06/11/2015: Magnesium 2.3; TSH 3.154 06/12/2015: BUN 17; Creatinine, Ser 0.79; Hemoglobin 10.5*; Platelets 193; Potassium 3.8; Sodium 135    Lipid Panel Lab Results  Component Value Date   CHOL 201* 09/26/2013   HDL 85.10 09/26/2013   LDLCALC 102* 09/26/2013   TRIG 69.0 09/26/2013      Wt Readings from Last 3 Encounters:  08/30/15 159 lb 8 oz (72.349 kg)  06/11/15 148 lb (67.132 kg)  05/02/15 160 lb (72.576 kg)       ASSESSMENT AND PLAN:  PAF (paroxysmal atrial fibrillation) ([place]) - Plan: EKG 12-Lead Denies any symptoms concerning for atrial fibrillation. No medication changes made  Essential hypertension Blood pressure is well controlled on today's visit. No changes made to the medications. Recommended she monitor blood  pressure at home  Chest pain, unspecified chest pain type Denies any further episodes of chest pain. No further workup at this time We did discuss stress testing, she prefers to wait and see if she has any further episodes  Disposition:   F/U  6 months, she prefers 6 months rather than 12 months   Orders Placed This Encounter  Procedures  . EKG 12-Lead     Total encounter time more than 15 minutes  Greater than 50% was spent in counseling and coordination of care with the patient   Signed, Esmond Plants, M.D., Ph.D. 08/30/2015  [place], [place]

## 2015-12-12 DIAGNOSIS — G8929 Other chronic pain: Secondary | ICD-10-CM | POA: Insufficient documentation

## 2015-12-12 DIAGNOSIS — M545 Low back pain: Secondary | ICD-10-CM

## 2015-12-20 ENCOUNTER — Other Ambulatory Visit: Payer: Self-pay | Admitting: Neurological Surgery

## 2015-12-20 DIAGNOSIS — G629 Polyneuropathy, unspecified: Secondary | ICD-10-CM

## 2015-12-26 ENCOUNTER — Ambulatory Visit
Admission: RE | Admit: 2015-12-26 | Discharge: 2015-12-26 | Disposition: A | Discharge status: Home / Self Care | Payer: BLUE CROSS/BLUE SHIELD | Source: Ambulatory Visit | Attending: Neurological Surgery | Admitting: Neurological Surgery

## 2015-12-26 DIAGNOSIS — M47892 Other spondylosis, cervical region: Secondary | ICD-10-CM | POA: Diagnosis not present

## 2015-12-26 DIAGNOSIS — G629 Polyneuropathy, unspecified: Secondary | ICD-10-CM | POA: Insufficient documentation

## 2016-01-01 ENCOUNTER — Ambulatory Visit: Payer: BLUE CROSS/BLUE SHIELD

## 2016-01-22 ENCOUNTER — Ambulatory Visit: Payer: BLUE CROSS/BLUE SHIELD | Admitting: Physical Therapy

## 2016-01-27 ENCOUNTER — Encounter: Payer: BLUE CROSS/BLUE SHIELD | Admitting: Physical Therapy

## 2016-01-28 DIAGNOSIS — R200 Anesthesia of skin: Secondary | ICD-10-CM | POA: Insufficient documentation

## 2016-01-30 ENCOUNTER — Encounter: Payer: BLUE CROSS/BLUE SHIELD | Admitting: Physical Therapy

## 2016-01-30 ENCOUNTER — Other Ambulatory Visit: Payer: Self-pay | Admitting: Cardiovascular Disease

## 2016-02-03 ENCOUNTER — Encounter: Payer: BLUE CROSS/BLUE SHIELD | Admitting: Physical Therapy

## 2016-02-14 ENCOUNTER — Ambulatory Visit: Payer: BLUE CROSS/BLUE SHIELD | Admitting: Cardiovascular Disease

## 2016-03-02 ENCOUNTER — Other Ambulatory Visit: Payer: Self-pay | Admitting: *Deleted

## 2016-03-02 MED ORDER — APIXABAN 5 MG PO TABS: 5.0000 mg | ORAL_TABLET | Freq: Two times a day (BID) | ORAL | 3 refills | Status: DC

## 2016-03-02 NOTE — Telephone Encounter (Signed)
Patient Assistance forms have been placed for signature in MD basket. I have left message on voicemail for pt to contact office concerning information needed for application and income verification.

## 2016-03-18 ENCOUNTER — Encounter: Payer: Self-pay | Admitting: Cardiovascular Disease

## 2016-03-18 ENCOUNTER — Ambulatory Visit (INDEPENDENT_AMBULATORY_CARE_PROVIDER_SITE_OTHER): Payer: Self-pay | Admitting: Cardiovascular Disease

## 2016-03-18 VITALS — BP 190/90 | HR 57 | Ht 62.0 in | Wt 175.0 lb

## 2016-03-18 DIAGNOSIS — G8929 Other chronic pain: Secondary | ICD-10-CM

## 2016-03-18 DIAGNOSIS — M549 Dorsalgia, unspecified: Secondary | ICD-10-CM

## 2016-03-18 DIAGNOSIS — Z0000 Encounter for general adult medical examination without abnormal findings: Secondary | ICD-10-CM

## 2016-03-18 DIAGNOSIS — I10 Essential (primary) hypertension: Secondary | ICD-10-CM

## 2016-03-18 DIAGNOSIS — I480 Paroxysmal atrial fibrillation: Secondary | ICD-10-CM

## 2016-03-18 DIAGNOSIS — R635 Abnormal weight gain: Secondary | ICD-10-CM | POA: Insufficient documentation

## 2016-03-18 MED ORDER — FLECAINIDE ACETATE 50 MG PO TABS: 50.0000 mg | ORAL_TABLET | Freq: Two times a day (BID) | ORAL | 11 refills | Status: DC

## 2016-03-18 MED ORDER — AZILSARTAN MEDOXOMIL 80 MG PO TABS: 80.0000 mg | ORAL_TABLET | Freq: Every day | ORAL | 11 refills | Status: DC

## 2016-03-18 MED ORDER — PROPRANOLOL HCL ER 80 MG PO CP24: 80.0000 mg | ORAL_CAPSULE | Freq: Every day | ORAL | 11 refills | Status: DC

## 2016-03-18 NOTE — Patient Instructions (Addendum)
Medication Instructions:   Please continue propranolol one a day  Please start edarbi one a day for high blood pressure  Call our office with blood pressure numbers   Labwork:  No new labs needed  Testing/Procedures:  No further testing at this time   I recommend watching educational videos on topics of interest to you at:       [email or web address]  Enter code: HEARTCARE    Follow-Up: It was a pleasure seeing you in the office today. Please call us if you have new issues that need to be addressed before your next appt.  [phone]  Your physician wants you to follow-up in: 1 month.    If you need a refill on your cardiac medications before your next appointment, please call your pharmacy.

## 2016-03-18 NOTE — Progress Notes (Signed)
Patient ID: Fae Stetz, female   DOB: 04-06-1945, 71 y.o.   MRN: [id number] Cardiology Office Note  Date:  03/18/2016   ID:  Javiona Baich, DOB November 30, 1945, MRN [id number]  PCP:  Hortencia Conradi, MD   Chief Complaint  Patient presents with  . other    68mo f/u. Pt c/o "fast heart rate" and chest pain with sob 2x since last visit. Reviewed meds with pt verbally.    HPI:  Ms. Lat is a 71 year old woman seen several years ago in 2012 for acute hypertension in the dentist chair,  presenting to the hospital with  atrial fibrillation on 04/28/2014, started on Cardizem, Lopressor converting to normal sinus rhythm.. Prior episode 6 months prior. She was started on anticoagulation with eliquis 5 mg by mouth twice a day, also started on flecainide 50 mgrams twice a day, continued on beta blocker. She presents today for follow-up of her atrial fibrillation and for hypertension  In follow-up today, she reports that she does not have any health insurance Was on short-term disability, then moved to long-term disability She has Medicare part a No prescription coverage She has run out of several of her medications but does have propranolol, 5 more pills She is very anxious and she attributes this to her high blood pressure today  Blood pressure even on repeat was [id number] systolic over 26 Daughter presents with her today Reports that she had some swelling in her legs over the holidays but she was standing for 3 days straight cooking, likely had high salt intake Edema has resolved  She denies any tachycardia palpitations concerning for atrial fibrillation She has not been checking her blood pressure at home recently She has chronic back pain, unable to work, was let go by her company, now unemployed but on long-term disability for one more month  Previous blood pressures reviewed from past office visits typically systolic pressure [id number]  EKG on today's visit shows sinus bradycardia rate 57 bpm,  left axis deviation  Other past medical history reviewed Knee replacement February 2017 Back in the hospital March 2017 with weakness and chills, had been treated for urinary tract infection, Dehydrated In the emergency room had 14 beat run of Vtach (this was in fact artifact on further review) Echocardiogram showed normal ejection fraction 06/12/2015  Echocardiogram from the hospital shows ejection fraction 60-65%, otherwise normal study  PMH:   has a past medical history of Anxiety; Chronic back pain; Family history of adverse reaction to anesthesia; Hip joint pain; Hypertension; Osteoarthritis; PAF (paroxysmal atrial fibrillation) ([place]); Palpitations; Wears contact lenses; and Wears dentures.  PSH:    Past Surgical History:  Procedure Laterality Date  . CHOLECYSTECTOMY    . COLONOSCOPY WITH PROPOFOL N/A 11/05/2014   Procedure: COLONOSCOPY WITH PROPOFOL;  Surgeon: Lucilla Lame, MD;  Location: [place];  Service: Endoscopy;  Laterality: N/A;  . JOINT REPLACEMENT    . KNEE ARTHROSCOPY Left 06/28/2014   Procedure: ARTHROSCOPY KNEE WITH PARTIALLATERAL MENISECTOMY AND DEBRIDEMENT;  Surgeon: Susa Day, MD;  [facility];  Service: Orthopedics;  Laterality: Left;  . TOOTH EXTRACTION    . TOTAL KNEE ARTHROPLASTY Left 05/02/2015   Procedure: TOTAL KNEE ARTHROPLASTY;  Surgeon: Hessie Knows, MD;  Location: [facility];  Service: Orthopedics;  Laterality: Left;    Current Outpatient Prescriptions  Medication Sig Dispense Refill  . acetaminophen (TYLENOL) 500 MG tablet Take 1 tablet (500 mg total) by mouth every 6 (six) hours as needed for mild pain. 30 tablet 0  . apixaban (  ELIQUIS) 5 MG TABS tablet Take 1 tablet (5 mg total) by mouth 2 (two) times daily. 180 tablet 3  . ergocalciferol (VITAMIN D2) 50000 units capsule Take 50,000 Units by mouth every Monday.     . flecainide (TAMBOCOR) 50 MG tablet Take 1 tablet (50 mg total) by mouth 2 (two) times daily. 180 tablet 3  .  nitroGLYCERIN (NITROSTAT) 0.4 MG SL tablet Place 1 tablet (0.4 mg total) under the tongue every 5 (five) minutes as needed for chest pain. Call 911 if pain not resolved after 3 pills 15 tablet 1  . propranolol ER (INDERAL LA) 80 MG 24 hr capsule Take 1 capsule (80 mg total) by mouth daily. 90 capsule 3   No current facility-administered medications for this visit.      Allergies:   Fish allergy   Social History:  The patient  reports that she has never smoked. She has never used smokeless tobacco. She reports that she does not drink alcohol or use drugs.   Family History:   family history includes Arthritis in her daughter; Depression in her sister; Diabetes in her mother and sister; GER disease in her daughter; Heart disease in her brother, father, and mother; Hypertension in her brother, mother, and sister; Hypothyroidism in her daughter; Mental illness in her mother.    Review of Systems: Review of Systems  Constitutional: Negative.        Weight gain  Respiratory: Negative.   Cardiovascular: Negative.   Gastrointestinal: Negative.   Musculoskeletal: Positive for back pain and joint pain.  Neurological: Negative.   Psychiatric/Behavioral: The patient is nervous/anxious.   All other systems reviewed and are negative.    PHYSICAL EXAM: VS:  BP (!) 190/90 (BP Location: Left Arm, Patient Position: Sitting, Cuff Size: Normal)   Pulse (!) 57   Ht 5\' 2"  (1.575 m)   Wt 175 lb (79.4 kg)   BMI 32.01 kg/m  , BMI Body mass index is 32.01 kg/m.  blood pressure remains elevated on recheck by myself GEN: Well nourished, well developed, in no acute distress  HEENT: normal  Neck: no JVD, carotid bruits, or masses Cardiac: RRR; no murmurs, rubs, or gallops,no edema  Respiratory:  clear to auscultation bilaterally, normal work of breathing GI: soft, nontender, nondistended, + BS MS: no deformity or atrophy  Skin: warm and dry, no rash Neuro:  Strength and sensation are intact Psych:  euthymic mood, full affect    Recent Labs: 06/11/2015: Magnesium 2.3; TSH 3.154 06/12/2015: BUN 17; Creatinine, Ser 0.79; Hemoglobin 10.5; Platelets 193; Potassium 3.8; Sodium 135    Lipid Panel Lab Results  Component Value Date   CHOL 201 (H) 09/26/2013   HDL 85.10 09/26/2013   LDLCALC 102 (H) 09/26/2013   TRIG 69.0 09/26/2013      Wt Readings from Last 3 Encounters:  03/18/16 175 lb (79.4 kg)  08/30/15 159 lb 8 oz (72.3 kg)  06/11/15 148 lb (67.1 kg)       ASSESSMENT AND PLAN:  PAF (paroxysmal atrial fibrillation) ([place]) - Plan: EKG 12-Lead Denies any symptoms concerning for atrial fibrillation. No medication changes made  Essential hypertension Blood pressure markedly elevated even on recheck Etiology unclear, possibly from anxiety about her finances We will try to get her set up with medical management for her medications Assistance possibly available through [facility] for her eliquis. 5 weeks of Samples of edarbi 80 mg provided, recommended she take 1 a day and continue her propranolol for hypertension Recommended she monitor  blood pressure at home and call us in the next several days with blood pressure measurements Potentially could change to losartan on follow-up visit in one month Could also add HCTZ or amlodipine  Chest pain, unspecified chest pain type Denies any further episodes of chest pain. No further workup at this time  Weight gain She attributes weight gain to taking Lyrica over the past year with dramatic weight gain Currently not taking the medication anymore  Anxiety   stemming from her social/financial situation Chronic back pain, unable to work, let go from her job, did not set up with Medicare Does not have any prescription coverage  Disposition:   F/U  1 month to check blood pressure   Orders Placed This Encounter  Procedures  . EKG 12-Lead     Total encounter time more than 15 minutes  Greater than 50% was spent in counseling  and coordination of care with the patient   Signed, Esmond Plants, M.D., Ph.D. 03/18/2016  [place], [place]

## 2016-03-23 ENCOUNTER — Telehealth: Payer: Self-pay | Admitting: Cardiovascular Disease

## 2016-03-23 MED ORDER — APIXABAN 5 MG PO TABS: 5.0000 mg | ORAL_TABLET | Freq: Two times a day (BID) | ORAL | 3 refills | Status: DC

## 2016-03-23 NOTE — Telephone Encounter (Signed)
Pt daughter calling asking if they can pick up a prescription for Elquis  Please call back For they will "shop around" to find a cheaper place for this year Patient is completely out Please advise.

## 2016-03-23 NOTE — Telephone Encounter (Signed)
Notified Gina Frazier the written Rx is available to pick up for Eliquis 5mg .

## 2016-03-25 ENCOUNTER — Telehealth: Payer: Self-pay | Admitting: Cardiovascular Disease

## 2016-03-25 NOTE — Telephone Encounter (Signed)
Patient calling the office for samples of medication:   1.  What medication and dosage are you requesting samples for? Eliquis 5 mg po bid   2.  Are you currently out of this medication? Yes in office now to pick up rx but cannot afford awaiting approval from medication management clinic

## 2016-03-25 NOTE — Telephone Encounter (Signed)
Samples given to patient upon arrival.

## 2016-04-20 ENCOUNTER — Ambulatory Visit: Payer: BLUE CROSS/BLUE SHIELD | Admitting: Cardiovascular Disease

## 2016-05-16 ENCOUNTER — Emergency Department
Admission: EM | Admit: 2016-05-16 | Discharge: 2016-05-16 | Disposition: A | Discharge status: Home / Self Care | Payer: Medicare Other | Attending: Emergency Medicine | Admitting: Emergency Medicine

## 2016-05-16 ENCOUNTER — Encounter: Payer: Self-pay | Admitting: Emergency Medicine

## 2016-05-16 DIAGNOSIS — Z79899 Other long term (current) drug therapy: Secondary | ICD-10-CM | POA: Insufficient documentation

## 2016-05-16 DIAGNOSIS — I159 Secondary hypertension, unspecified: Secondary | ICD-10-CM | POA: Insufficient documentation

## 2016-05-16 DIAGNOSIS — I10 Essential (primary) hypertension: Secondary | ICD-10-CM | POA: Diagnosis not present

## 2016-05-16 LAB — BASIC METABOLIC PANEL
ANION GAP: 7 (ref 5–15)
BUN: 20 mg/dL (ref 6–20)
CALCIUM: 8.8 mg/dL — AB (ref 8.9–10.3)
CO2: 30 mmol/L (ref 22–32)
Chloride: 102 mmol/L (ref 101–111)
Creatinine, Ser: 0.78 mg/dL (ref 0.44–1.00)
Glucose, Bld: 100 mg/dL — ABNORMAL HIGH (ref 65–99)
Potassium: 3.8 mmol/L (ref 3.5–5.1)
SODIUM: 139 mmol/L (ref 135–145)

## 2016-05-16 LAB — TROPONIN I: Troponin I: <0.03 ng/mL (ref <0.03)

## 2016-05-16 LAB — URINALYSIS, COMPLETE (UACMP) WITH MICROSCOPIC
BILIRUBIN URINE: NEGATIVE
Glucose, UA: NEGATIVE mg/dL
Ketones, ur: NEGATIVE mg/dL
Leukocytes, UA: NEGATIVE
NITRITE: NEGATIVE
Protein, ur: NEGATIVE mg/dL
SPECIFIC GRAVITY, URINE: 1.002 — AB (ref 1.005–1.030)
pH: 7 (ref 5.0–8.0)

## 2016-05-16 LAB — CBC
HCT: 41.5 % (ref 35.0–47.0)
HEMOGLOBIN: 13.7 g/dL (ref 12.0–16.0)
MCH: 29.7 pg (ref 26.0–34.0)
MCHC: 33.1 g/dL (ref 32.0–36.0)
MCV: 89.9 fL (ref 80.0–100.0)
Platelets: 211 10*3/uL (ref 150–440)
RBC: 4.62 MIL/uL (ref 3.80–5.20)
RDW: 14 % (ref 11.5–14.5)
WBC: 7.2 10*3/uL (ref 3.6–11.0)

## 2016-05-16 MED ORDER — PROPRANOLOL HCL 20 MG PO TABS
20.0000 mg | ORAL_TABLET | Freq: Once | ORAL | Status: DC
  Filled 2016-05-16: qty 1

## 2016-05-16 NOTE — ED Triage Notes (Signed)
Pt ambulatory to triage in NAD, report elevated blood pressure with SBP in 190s at home, report nausea and dizziness at home.

## 2016-05-16 NOTE — ED Provider Notes (Signed)
[facility] Emergency Department Provider Note  Time seen: 1:58 AM  I have reviewed the triage vital signs and the nursing notes.   HISTORY  Chief Complaint Hypertension and Dizziness    HPI Gina Frazier is a 71 y.o. female with a past medical history of anxiety, hypertension, paroxysmal atrial fibrillation presents to the emergency department with dizziness and hypertension. According to the patient this evening she was feeling dizzy she took her blood pressure was 0000000 systolic. Patient states she went to lay down but was nervous and felt like her heart was beating harder so she took her blood pressure again and it was 99991111 systolic so she came to the emergency department for evaluation. Denies any chest pain denies any weakness or numbness. Denies any headache.Patient states she still feels very mild dizziness but feels much better than she did earlier tonight. States her symptoms started around 10 PM.  Past Medical History:  Diagnosis Date  . Anxiety   . Chronic back pain   . Family history of adverse reaction to anesthesia    pts daughter had severe vomiting 2014   . Hip joint pain    left  . Hypertension   . Osteoarthritis   . PAF (paroxysmal atrial fibrillation) ([place])    a. initially diagnosed 04/2014; b. on Eliquis; c. CHADS2VASc = 3  . Palpitations   . Wears contact lenses   . Wears dentures    partial upper    Patient Active Problem List   Diagnosis Date Noted  . Weight gain 03/18/2016  . Weakness   . Weight loss   . PAF (paroxysmal atrial fibrillation) ([place])   . Primary osteoarthritis of knee 05/02/2015  . Chest pain 01/28/2015  . Special screening for malignant neoplasms, colon   . Snoring 05/01/2014  . Anxiety attack 12/30/2010  . Visit for preventive health examination 12/30/2010  . Hypertension 07/21/2010  . Chronic back pain 07/21/2010  . Vitamin D deficiency 07/21/2010  . SHORTNESS OF BREATH 05/01/2010  . Essential hypertension  04/18/2010  . OSTEOARTHROS UNSPEC GEN/LOC OTH SPEC SITES 04/18/2010  . KNEE PAIN, LEFT 04/18/2010    Past Surgical History:  Procedure Laterality Date  . CHOLECYSTECTOMY    . COLONOSCOPY WITH PROPOFOL N/A 11/05/2014   Procedure: COLONOSCOPY WITH PROPOFOL;  Surgeon: Lucilla Lame, MD;  Location: [place];  Service: Endoscopy;  Laterality: N/A;  . JOINT REPLACEMENT    . KNEE ARTHROSCOPY Left 06/28/2014   Procedure: ARTHROSCOPY KNEE WITH PARTIALLATERAL MENISECTOMY AND DEBRIDEMENT;  Surgeon: Susa Day, MD;  [facility];  Service: Orthopedics;  Laterality: Left;  . TOOTH EXTRACTION    . TOTAL KNEE ARTHROPLASTY Left 05/02/2015   Procedure: TOTAL KNEE ARTHROPLASTY;  Surgeon: Hessie Knows, MD;  Location: [facility];  Service: Orthopedics;  Laterality: Left;    Prior to Admission medications   Medication Sig Start Date End Date Taking? Authorizing Provider  acetaminophen (TYLENOL) 500 MG tablet Take 1 tablet (500 mg total) by mouth every 6 (six) hours as needed for mild pain. 06/12/15 06/11/16  Nicholes Mango, MD  apixaban (ELIQUIS) 5 MG TABS tablet Take 1 tablet (5 mg total) by mouth 2 (two) times daily. 03/23/16   Minna Merritts, MD  Azilsartan Medoxomil (EDARBI) 80 MG TABS Take 1 tablet (80 mg total) by mouth daily. 03/18/16   Minna Merritts, MD  ergocalciferol (VITAMIN D2) 50000 units capsule Take 50,000 Units by mouth every Monday.     Historical Provider, MD  flecainide [facility])  50 MG tablet Take 1 tablet (50 mg total) by mouth 2 (two) times daily. 03/18/16   Minna Merritts, MD  nitroGLYCERIN (NITROSTAT) 0.4 MG SL tablet Place 1 tablet (0.4 mg total) under the tongue every 5 (five) minutes as needed for chest pain. Call 911 if pain not resolved after 3 pills 01/28/15 05/29/16  Minna Merritts, MD  propranolol ER (INDERAL LA) 80 MG 24 hr capsule Take 1 capsule (80 mg total) by mouth daily. 03/18/16   Minna Merritts, MD    Allergies  Allergen Reactions  . Fish Allergy      REACTION: vomitting, swelling shell fish    Family History  Problem Relation Age of Onset  . Heart disease Mother   . Hypertension Mother   . Mental illness Mother   . Diabetes Mother   . Heart disease Father   . Hypertension Sister   . Depression Sister   . Diabetes Sister   . Heart disease Brother   . Hypertension Brother   . Arthritis Daughter   . GER disease Daughter   . Hypothyroidism Daughter     Social History Social History  Substance Use Topics  . Smoking status: Never Smoker  . Smokeless tobacco: Never Used  . Alcohol use No    Review of Systems Constitutional: Negative for fever.Positive for dizziness, largely resolved. Cardiovascular: Negative for chest pain. Respiratory: Negative for shortness of breath. Gastrointestinal: Negative for abdominal pain Neurological: Negative for headaches, focal weakness or numbness. 10-point ROS otherwise negative.  ____________________________________________   PHYSICAL EXAM:  VITAL SIGNS: ED Triage Vitals  Enc Vitals Group     BP 05/16/16 0029 (!) 198/112     Pulse Rate 05/16/16 0029 (!) 110     Resp 05/16/16 0029 20     Temp 05/16/16 0029 98.4 F (36.9 C)     Temp Source 05/16/16 0029 Oral     SpO2 05/16/16 0029 98 %     Weight 05/16/16 0029 175 lb (79.4 kg)     Height 05/16/16 0029 5\' 2"  (1.575 m)     Head Circumference --      Peak Flow --      Pain Score 05/16/16 0030 0     Pain Loc --      Pain Edu? --      Excl. in [place]? --     Constitutional: Alert and oriented. Well appearing and in no distress. Eyes: Normal exam ENT   Head: Normocephalic and atraumatic   Mouth/Throat: Mucous membranes are moist. Cardiovascular: Normal rate, regular rhythm. No murmur Respiratory: Normal respiratory effort without tachypnea nor retractions. Breath sounds are clear Gastrointestinal: Soft and nontender. No distention.   Musculoskeletal: Nontender with normal range of motion in all extremities. No lower  extremity tenderness or edema. Neurologic:  Normal speech and language. No gross focal neurologic deficits  Skin:  Skin is warm, dry and intact.  Psychiatric: Mood and affect are normal.   ____________________________________________    EKG  EKG reviewed and interpreted by myself shows atrial flutter with a 2 to one block at 108 bpm, narrow QRS, normal axis, nonspecific but no concerning ST changes.  ____________________________________________   INITIAL IMPRESSION / ASSESSMENT AND PLAN / ED COURSE  Pertinent labs & imaging results that were available during my care of the patient were reviewed by me and considered in my medical decision making (see chart for details).  The patient presents to the emergency department with dizziness and elevated blood pressure at  home. Blood pressure currently 154/77. We will dose the patient's propranolol in the emergency department and continue to monitor closely. Patient takes flecanide and eliquis for her atrial fibrillation. Overall the patient appears very well with a so far normal exam and workup. We will repeat a troponin and approximate one hour.  Repeat troponin is negative. Blood pressure is now 120/59 without intervention. Patient will be discharged home.  ____________________________________________   FINAL CLINICAL IMPRESSION(S) / ED DIAGNOSES   hypertension    Harvest Dark, MD 05/16/16 [phone]

## 2016-07-23 DIAGNOSIS — G608 Other hereditary and idiopathic neuropathies: Secondary | ICD-10-CM | POA: Insufficient documentation

## 2016-11-23 IMAGING — CR DG CHEST 1V PORT
1 series · 1 of 1 positions shown · non-contrast
Comparison: 12/30/2010

CLINICAL DATA: Hypertension.  Right arm pain.

EXAM:
PORTABLE CHEST - 1 VIEW

[ap]
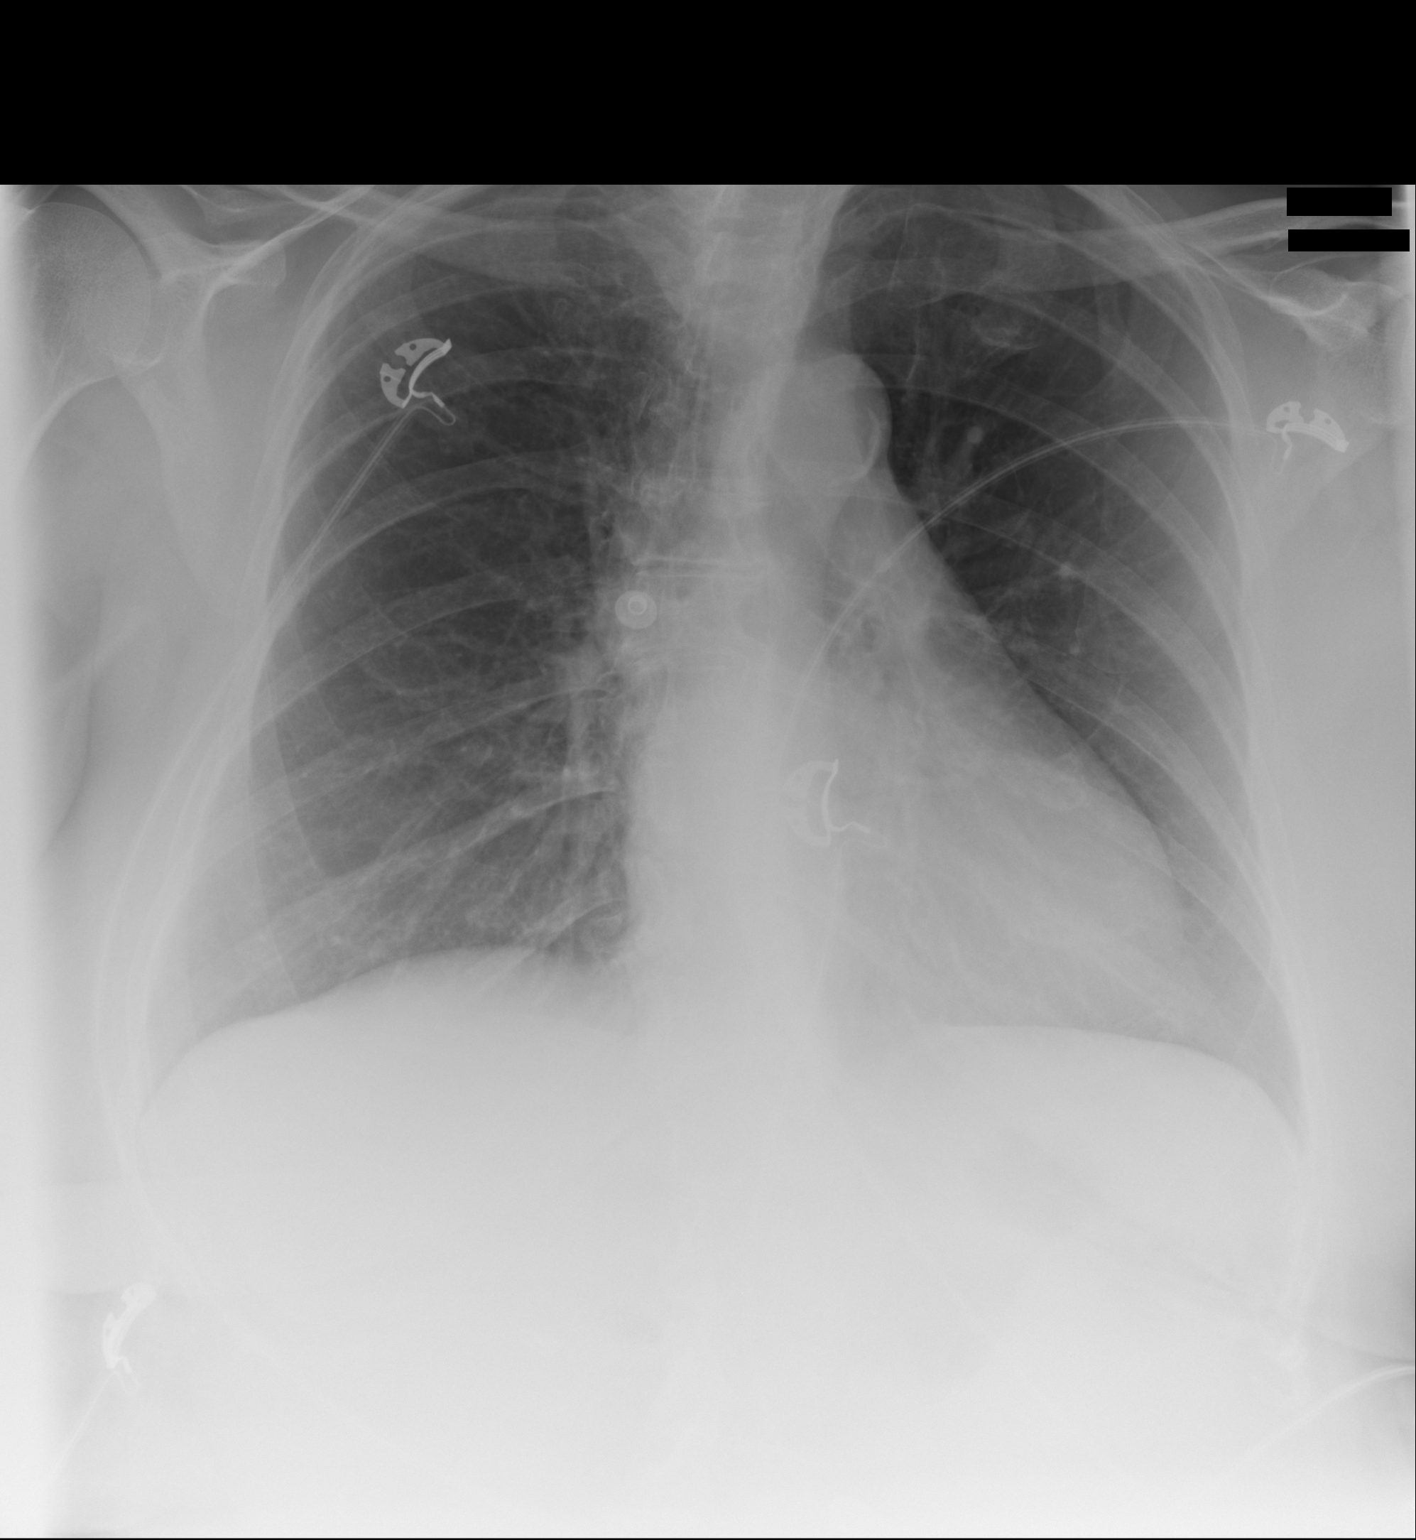

[1 of 1 positions shown; findings below may reference images not displayed]

FINDINGS: Atherosclerotic calcification of the aortic arch. Heart size within
normal limits. Thoracic spondylosis. The lungs appear clear. No
pleural effusion.
IMPRESSION: 1. Stable chronic atherosclerotic calcification of the aortic arch.
2. Thoracic spondylosis.
3. No acute findings.

## 2016-12-22 DIAGNOSIS — R413 Other amnesia: Secondary | ICD-10-CM | POA: Diagnosis not present

## 2016-12-22 DIAGNOSIS — G608 Other hereditary and idiopathic neuropathies: Secondary | ICD-10-CM | POA: Diagnosis not present

## 2016-12-22 DIAGNOSIS — G250 Essential tremor: Secondary | ICD-10-CM | POA: Diagnosis not present

## 2016-12-23 ENCOUNTER — Other Ambulatory Visit: Payer: Self-pay | Admitting: Neurology

## 2016-12-23 DIAGNOSIS — Z96652 Presence of left artificial knee joint: Secondary | ICD-10-CM | POA: Diagnosis not present

## 2016-12-23 DIAGNOSIS — R413 Other amnesia: Secondary | ICD-10-CM

## 2016-12-23 DIAGNOSIS — T8484XA Pain due to internal orthopedic prosthetic devices, implants and grafts, initial encounter: Secondary | ICD-10-CM | POA: Diagnosis not present

## 2016-12-23 DIAGNOSIS — M25562 Pain in left knee: Secondary | ICD-10-CM | POA: Diagnosis not present

## 2017-01-05 ENCOUNTER — Ambulatory Visit
Admission: RE | Admit: 2017-01-05 | Discharge: 2017-01-05 | Disposition: A | Discharge status: Home / Self Care | Payer: Medicare Other | Source: Ambulatory Visit | Attending: Neurology | Admitting: Neurology

## 2017-01-05 DIAGNOSIS — R413 Other amnesia: Secondary | ICD-10-CM | POA: Diagnosis not present

## 2017-01-18 ENCOUNTER — Other Ambulatory Visit: Payer: Self-pay | Admitting: Cardiovascular Disease

## 2017-01-21 ENCOUNTER — Telehealth: Payer: Self-pay | Admitting: Cardiovascular Disease

## 2017-01-21 NOTE — Telephone Encounter (Signed)
-----   Message from Janan Ridge, [place] sent at 01/18/2017  4:17 PM EST ----- Will you please try to schedule an appointment with Dr. Rockey Situ

## 2017-01-21 NOTE — Telephone Encounter (Signed)
Lmov for patient to call back and schedule appointment ° ° °

## 2017-01-25 NOTE — Telephone Encounter (Signed)
Lmov for patient to call back and schedule appointment ° ° °

## 2017-01-27 DIAGNOSIS — T8484XD Pain due to internal orthopedic prosthetic devices, implants and grafts, subsequent encounter: Secondary | ICD-10-CM | POA: Diagnosis not present

## 2017-01-27 DIAGNOSIS — M2550 Pain in unspecified joint: Secondary | ICD-10-CM | POA: Diagnosis not present

## 2017-01-27 DIAGNOSIS — Z96652 Presence of left artificial knee joint: Secondary | ICD-10-CM | POA: Diagnosis not present

## 2017-01-28 DIAGNOSIS — Z23 Encounter for immunization: Secondary | ICD-10-CM | POA: Diagnosis not present

## 2017-01-28 NOTE — Telephone Encounter (Signed)
Not able to get in contact with patient.   Lmov for patient to call back and schedule appointment

## 2017-02-05 DIAGNOSIS — H04123 Dry eye syndrome of bilateral lacrimal glands: Secondary | ICD-10-CM | POA: Diagnosis not present

## 2017-02-08 ENCOUNTER — Encounter: Payer: Self-pay | Admitting: Cardiovascular Disease

## 2017-02-08 ENCOUNTER — Ambulatory Visit (INDEPENDENT_AMBULATORY_CARE_PROVIDER_SITE_OTHER): Payer: Medicare Other | Admitting: Cardiovascular Disease

## 2017-02-08 ENCOUNTER — Encounter
Admission: RE | Admit: 2017-02-08 | Discharge: 2017-02-08 | Disposition: A | Discharge status: Home / Self Care | Payer: Medicare Other | Source: Ambulatory Visit | Attending: Orthopedic Surgery | Admitting: Orthopedic Surgery

## 2017-02-08 ENCOUNTER — Other Ambulatory Visit: Payer: Self-pay

## 2017-02-08 VITALS — BP 135/85 | HR 54 | Ht 62.0 in | Wt 166.5 lb

## 2017-02-08 DIAGNOSIS — M25562 Pain in left knee: Secondary | ICD-10-CM

## 2017-02-08 DIAGNOSIS — Z01812 Encounter for preprocedural laboratory examination: Secondary | ICD-10-CM

## 2017-02-08 DIAGNOSIS — G8929 Other chronic pain: Secondary | ICD-10-CM | POA: Insufficient documentation

## 2017-02-08 DIAGNOSIS — Z01810 Encounter for preprocedural cardiovascular examination: Secondary | ICD-10-CM | POA: Diagnosis not present

## 2017-02-08 DIAGNOSIS — R9431 Abnormal electrocardiogram [ECG] [EKG]: Secondary | ICD-10-CM

## 2017-02-08 DIAGNOSIS — F419 Anxiety disorder, unspecified: Secondary | ICD-10-CM | POA: Diagnosis not present

## 2017-02-08 DIAGNOSIS — I480 Paroxysmal atrial fibrillation: Secondary | ICD-10-CM

## 2017-02-08 DIAGNOSIS — I10 Essential (primary) hypertension: Secondary | ICD-10-CM

## 2017-02-08 DIAGNOSIS — I4891 Unspecified atrial fibrillation: Secondary | ICD-10-CM | POA: Insufficient documentation

## 2017-02-08 LAB — URINALYSIS, COMPLETE (UACMP) WITH MICROSCOPIC
BILIRUBIN URINE: NEGATIVE
Bacteria, UA: NONE SEEN
Glucose, UA: NEGATIVE mg/dL
KETONES UR: NEGATIVE mg/dL
Leukocytes, UA: NEGATIVE
Nitrite: NEGATIVE
PROTEIN: NEGATIVE mg/dL
Specific Gravity, Urine: 1.011 (ref 1.005–1.030)
pH: 5 (ref 5.0–8.0)

## 2017-02-08 LAB — TYPE AND SCREEN
ABO/RH(D): A POS
ANTIBODY SCREEN: NEGATIVE

## 2017-02-08 LAB — CBC
HEMATOCRIT: 38 % (ref 35.0–47.0)
HEMOGLOBIN: 12.6 g/dL (ref 12.0–16.0)
MCH: 30 pg (ref 26.0–34.0)
MCHC: 33.3 g/dL (ref 32.0–36.0)
MCV: 90.2 fL (ref 80.0–100.0)
Platelets: 216 10*3/uL (ref 150–440)
RBC: 4.21 MIL/uL (ref 3.80–5.20)
RDW: 13.4 % (ref 11.5–14.5)
WBC: 5.1 10*3/uL (ref 3.6–11.0)

## 2017-02-08 LAB — SURGICAL PCR SCREEN
MRSA, PCR: NEGATIVE
Staphylococcus aureus: NEGATIVE

## 2017-02-08 LAB — BASIC METABOLIC PANEL
ANION GAP: 9 (ref 5–15)
BUN: 19 mg/dL (ref 6–20)
CALCIUM: 9.1 mg/dL (ref 8.9–10.3)
CO2: 29 mmol/L (ref 22–32)
Chloride: 99 mmol/L — ABNORMAL LOW (ref 101–111)
Creatinine, Ser: 0.73 mg/dL (ref 0.44–1.00)
Glucose, Bld: 79 mg/dL (ref 65–99)
Potassium: 3.7 mmol/L (ref 3.5–5.1)
SODIUM: 137 mmol/L (ref 135–145)

## 2017-02-08 LAB — APTT: aPTT: 31 seconds (ref 24–36)

## 2017-02-08 LAB — PROTIME-INR
INR: 1.06
Prothrombin Time: 13.7 seconds (ref 11.4–15.2)

## 2017-02-08 LAB — SEDIMENTATION RATE: SED RATE: 20 mm/h (ref 0–30)

## 2017-02-08 NOTE — Patient Instructions (Signed)
Your procedure is scheduled on: February 11, 2017 Report to second floor of the [place] come thru revolving door To find out your arrival time please call [phone] between 1PM - 3PM on February 10, 2017  Remember: Instructions that are not followed completely may result in serious medical risk, up to and including death, or upon the discretion of your surgeon and anesthesiologist your surgery may need to be rescheduled.     _X__ 1. Do not eat food after midnight the night before your procedure.                 No gum chewing or hard candies. You may drink clear liquids up to 2 hours                 before you are scheduled to arrive for your surgery- DO not drink clear                 liquids within 2 hours of the start of your surgery.                 Clear Liquids include:  water, apple juice without pulp, clear carbohydrate                 drink such as Clearfast of Gartorade, Black Coffee or Tea (Do not add                 anything to coffee or tea).     _X__ 2.  No Alcohol for 24 hours before or after surgery.   _X__ 3.  Do Not Smoke or use e-cigarettes For 24 Hours Prior to Your Surgery.                 Do not use any chewable tobacco products for at least 6 hours prior to                 surgery.  __x__  4.  Bring all new medications with you on the day of surgery if instructed.   __x__  5.  Notify your doctor if there is any change in your medical condition      (cold, fever, infections).     Do not wear jewelry, make-up, hairpins, clips or nail polish. Do not wear lotions, powders, or perfumes. You may wear deodorant. Do not shave 48 hours prior to surgery. Men may shave face and neck. Do not bring valuables to the hospital.    [facility] is not responsible for any belongings or valuables.  Contacts, dentures or bridgework may not be worn into surgery. Leave your suitcase in the car. After surgery it may be brought to your room. For  patients admitted to the hospital, discharge time is determined by your treatment team.   Patients discharged the day of surgery will not be allowed to drive home.   Please read over the following fact sheets that you were given:  Chg instruction    __x__ Take these medicines the morning of surgery with A SIP OF WATER:    1. propranolol  2. flecainide  3.   4.  5.  6.     __x__ Use CHG Soap as directed  __x__ Use inhalers on the day of surgery    __x__ Stop Coumadin/Plavix/aspirin per instruction of the cardiologist  __x__ Stop Anti-inflammatories until after surgery - no ibuprofen, advil, motrin, aleve, naproxyn, naprosyn and no goody's powders   ____ Stop supplements until after surgery.

## 2017-02-08 NOTE — Pre-Procedure Instructions (Signed)
EKG/ REQUEST FOR CLEARANCE AS INSTRUCTED BY DR P CARROLL CALEED AND FAXED TO DR Rockey Situ. Gina Frazier IS BEING SEEN TODAY. CASEY AT [facility] NOTIFIED

## 2017-02-08 NOTE — Patient Instructions (Addendum)
Medication Instructions:   No eliquis on Tuesday, Wednesday, Thursday  Take flecainide and propranolol morning of the surgery  Labwork:  No new labs needed  Testing/Procedures:  No further testing at this time   Follow-Up: It was a pleasure seeing you in the office today. Please call us if you have new issues that need to be addressed before your next appt.  [phone]  Your physician wants you to follow-up in: 12 months.  You will receive a reminder letter in the mail two months in advance. If you don't receive a letter, please call our office to schedule the follow-up appointment.  If you need a refill on your cardiac medications before your next appointment, please call your pharmacy.

## 2017-02-08 NOTE — Progress Notes (Signed)
Patient ID: Gina Frazier, female   DOB: 08/11/1945, 71 y.o.   MRN: [id number]   Cardiology Office Note  Date:  02/08/2017   ID:  Gina Frazier, DOB Apr 03, 1945, MRN [id number]  PCP:  McLean-Scocuzza, Nino Glow, MD   Chief Complaint  Patient presents with  . other    Cardiac clearance for left total knee revision. Surgery is scheduled for 02/11/2017. Meds reviewd verbally with patient.     HPI:  Gina Frazier is a 71 year old woman with past medical history of Hypertension Evaluated in 2012 for acute hypertension in the dentist chair,  hospital with  atrial fibrillation on 04/28/2014, started on Cardizem, Lopressor converting to normal sinus rhythm (Prior episode 6 months prior) started on anticoagulation with eliquis 5 mg by mouth twice a day,  flecainide 50 mg twice a day, continued on beta blocker. She presents today for follow-up of her atrial fibrillation, hypertension, preop cardiovascular evaluation  Daughter presents with her today In follow-up today she reports that she has been doing well Blood pressure well controlled at home  On her initial visit today blood pressure was elevated, 361 systolic on CMA measurement Reports she was anxious on arrival Blood pressure checked later on her visit by myself, 135/80 measured  Confirmed twice  Previous office visit was elevated January 2018 again felt secondary to anxiety,   443 to 154 systolic at home with patient confirms  Has surgery on Thursday, Dr. Rudene Christians for repeat total knee replacement on the left She reports having previous total knee replacement 1-1/2 years ago  No significant shortness of breath or chest pain, no leg edema She denies any tachycardia palpitations concerning for atrial fibrillation  Chronic back pain, unable to work  EKG performed February 08, 2017 shows normal sinus rhythm no significant ST or T wave changes  Other past medical history reviewed Knee replacement February 2017 Back in the hospital  March 2017 with weakness and chills, had been treated for urinary tract infection, Dehydrated In the emergency room had 14 beat run of Vtach (this was in fact artifact on further review) Echocardiogram showed normal ejection fraction 06/12/2015  Echocardiogram from the hospital shows ejection fraction 60-65%, otherwise normal study  PMH:   has a past medical history of Anxiety, Chronic back pain, Family history of adverse reaction to anesthesia, Hip joint pain, Hypertension, Osteoarthritis, PAF (paroxysmal atrial fibrillation) ([place]), Palpitations, Wears contact lenses, and Wears dentures.  PSH:    Past Surgical History:  Procedure Laterality Date  . CHOLECYSTECTOMY    . COLONOSCOPY WITH PROPOFOL N/A 11/05/2014   Procedure: COLONOSCOPY WITH PROPOFOL;  Surgeon: Lucilla Lame, MD;  Location: [place];  Service: Endoscopy;  Laterality: N/A;  . JOINT REPLACEMENT    . KNEE ARTHROSCOPY Left 06/28/2014   Procedure: ARTHROSCOPY KNEE WITH PARTIALLATERAL MENISECTOMY AND DEBRIDEMENT;  Surgeon: Susa Day, MD;  Location: [facility];  Service: Orthopedics;  Laterality: Left;  . TOOTH EXTRACTION    . 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 acetaminophen (TYLENOL) 500 MG tablet Take 1,000 mg every 8 (eight) hours as needed by mouth for mild pain.    Marland Kitchen apixaban (ELIQUIS) 5 MG TABS tablet Take 1 tablet (5 mg total) by mouth 2 (two) times daily. 180 tablet 3  . DULoxetine (CYMBALTA) 20 MG capsule Take 40 mg daily by mouth.    Marland Kitchen  ergocalciferol (VITAMIN D2) 50000 units capsule Take 50,000 Units once a week by mouth. Saturdays    . flecainide (TAMBOCOR) 50 MG tablet Take 1 tablet (50 mg total) by mouth 2 (two) times daily. 60 tablet 11  . nitroGLYCERIN (NITROSTAT) 0.4 MG SL tablet Place 1 tablet (0.4 mg  total) under the tongue every 5 (five) minutes as needed for chest pain. Call 911 if pain not resolved after 3 pills 15 tablet 1  . propranolol ER (INDERAL LA) 80 MG 24 hr capsule Take 1 capsule (80 mg total) by mouth daily. 30 capsule 11   No current facility-administered medications for this visit.      Allergies:   Fish allergy   Social History:  The patient  reports that  has never smoked. she has never used smokeless tobacco. She reports that she does not drink alcohol or use drugs.   Family History:   family history includes Arthritis in her daughter; Depression in her sister; Diabetes in her mother and sister; GER disease in her daughter; Heart disease in her brother, father, and mother; Hypertension in her brother, mother, and sister; Hypothyroidism in her daughter; Mental illness in her mother.    Review of Systems: Review of Systems  Constitutional: Negative.        Weight gain  Respiratory: Negative.   Cardiovascular: Negative.   Gastrointestinal: Negative.   Musculoskeletal: Positive for back pain and joint pain.  Neurological: Negative.   Psychiatric/Behavioral: The patient is nervous/anxious.   All other systems reviewed and are negative.    PHYSICAL EXAM: VS:  BP (!) 190/70 (BP Location: Left Arm, Patient Position: Sitting, Cuff Size: Normal)   Pulse (!) 54   Ht 5\' 2"  (1.575 m)   Wt 166 lb 8 oz (75.5 kg)   BMI 30.45 kg/m  , BMI Body mass index is 30.45 kg/m.  blood pressure remains elevated on recheck by myself GEN: Well nourished, well developed, in no acute distress  HEENT: normal  Neck: no JVD, carotid bruits, or masses Cardiac: RRR; no murmurs, rubs, or gallops,no edema  Respiratory:  clear to auscultation bilaterally, normal work of breathing GI: soft, nontender, nondistended, + BS MS: no deformity or atrophy  Skin: warm and dry, no rash Neuro:  Strength and sensation are intact Psych: euthymic mood, full affect    Recent Labs: 02/08/2017: BUN  19; Creatinine, Ser 0.73; Hemoglobin 12.6; Platelets 216; Potassium 3.7; Sodium 137    Lipid Panel Lab Results  Component Value Date   CHOL 201 (H) 09/26/2013   HDL 85.10 09/26/2013   LDLCALC 102 (H) 09/26/2013   TRIG 69.0 09/26/2013      Wt Readings from Last 3 Encounters:  02/08/17 166 lb 8 oz (75.5 kg)  02/08/17 167 lb (75.8 kg)  05/16/16 175 lb (79.4 kg)       ASSESSMENT AND PLAN:  PAF (paroxysmal atrial fibrillation) ([place]) - Plan: EKG 12-Lead Denies any symptoms concerning for atrial fibrillation. No medication changes made  Preop cardiovascular Acceptable risk for surgery later this week with Dr. Rudene Christians.  Recommended she hold Eliquis 2 days prior to surgery No further workup needed Would recommend she start Eliquis following surgery when acceptable for surgical perspective  Essential hypertension Initially elevated on arrival with improvement on recheck by myself No medication changes made Typically has hypertensive episodes in the setting of anxiety such as dentist office or before surgery  Chest pain, unspecified chest pain type Denies any further episodes of chest pain. No further workup  at this time But stable  Anxiety  Reports this is stable Previously with financial stressors, lost her job  Disposition:   F/U 6 months   No orders of the defined types were placed in this encounter.    Total encounter time more than 25 minutes  Greater than 50% was spent in counseling and coordination of care with the patient   Signed, Esmond Plants, M.D., Ph.D. 02/08/2017  [place], [place]

## 2017-02-09 LAB — URINE CULTURE: CULTURE: NO GROWTH

## 2017-02-09 NOTE — Pre-Procedure Instructions (Signed)
Cleared by dr Rockey Situ 02/08/17 and to stop eliquis 2 days preop

## 2017-02-10 MED ORDER — CEFAZOLIN SODIUM-DEXTROSE 2-4 GM/100ML-% IV SOLN
2.0000 g | Freq: Once | INTRAVENOUS | Status: AC
  Administered 2017-02-11: 2 g via INTRAVENOUS

## 2017-02-11 ENCOUNTER — Inpatient Hospital Stay: Payer: Medicare Other | Admitting: Anesthesiology

## 2017-02-11 ENCOUNTER — Other Ambulatory Visit: Payer: Self-pay

## 2017-02-11 ENCOUNTER — Inpatient Hospital Stay
Admission: RE | Admit: 2017-02-11 | Discharge: 2017-02-13 | DRG: 489 | Disposition: A | Discharge status: Home Health | Payer: Medicare Other | Source: Ambulatory Visit | Attending: Orthopedic Surgery | Admitting: Orthopedic Surgery

## 2017-02-11 ENCOUNTER — Encounter: Payer: Self-pay | Admitting: Emergency Medicine

## 2017-02-11 ENCOUNTER — Encounter
Admission: RE | Disposition: A | Discharge status: Home Health | Payer: Self-pay | Source: Ambulatory Visit | Attending: Orthopedic Surgery

## 2017-02-11 DIAGNOSIS — Z91013 Allergy to seafood: Secondary | ICD-10-CM

## 2017-02-11 DIAGNOSIS — G8918 Other acute postprocedural pain: Secondary | ICD-10-CM | POA: Diagnosis not present

## 2017-02-11 DIAGNOSIS — Y792 Prosthetic and other implants, materials and accessory orthopedic devices associated with adverse incidents: Secondary | ICD-10-CM | POA: Diagnosis present

## 2017-02-11 DIAGNOSIS — Z01810 Encounter for preprocedural cardiovascular examination: Secondary | ICD-10-CM

## 2017-02-11 DIAGNOSIS — M25562 Pain in left knee: Secondary | ICD-10-CM | POA: Diagnosis not present

## 2017-02-11 DIAGNOSIS — Z01812 Encounter for preprocedural laboratory examination: Secondary | ICD-10-CM

## 2017-02-11 DIAGNOSIS — Z7901 Long term (current) use of anticoagulants: Secondary | ICD-10-CM | POA: Diagnosis not present

## 2017-02-11 DIAGNOSIS — T8484XA Pain due to internal orthopedic prosthetic devices, implants and grafts, initial encounter: Principal | ICD-10-CM | POA: Diagnosis present

## 2017-02-11 DIAGNOSIS — I10 Essential (primary) hypertension: Secondary | ICD-10-CM | POA: Diagnosis present

## 2017-02-11 DIAGNOSIS — M25362 Other instability, left knee: Secondary | ICD-10-CM | POA: Diagnosis present

## 2017-02-11 DIAGNOSIS — F419 Anxiety disorder, unspecified: Secondary | ICD-10-CM | POA: Diagnosis present

## 2017-02-11 DIAGNOSIS — Z96652 Presence of left artificial knee joint: Secondary | ICD-10-CM | POA: Diagnosis present

## 2017-02-11 DIAGNOSIS — Z0183 Encounter for blood typing: Secondary | ICD-10-CM | POA: Diagnosis not present

## 2017-02-11 DIAGNOSIS — I480 Paroxysmal atrial fibrillation: Secondary | ICD-10-CM | POA: Diagnosis present

## 2017-02-11 DIAGNOSIS — I4891 Unspecified atrial fibrillation: Secondary | ICD-10-CM | POA: Diagnosis not present

## 2017-02-11 DIAGNOSIS — T84093A Other mechanical complication of internal left knee prosthesis, initial encounter: Secondary | ICD-10-CM | POA: Diagnosis not present

## 2017-02-11 HISTORY — PX: TOTAL KNEE REVISION: SHX996

## 2017-02-11 SURGERY — TOTAL KNEE REVISION
Anesthesia: General | Laterality: Left | Wound class: Clean

## 2017-02-11 MED ORDER — MAGNESIUM HYDROXIDE 400 MG/5ML PO SUSP: 30.0000 mL | Freq: Every day | ORAL | Status: DC | PRN

## 2017-02-11 MED ORDER — ONDANSETRON HCL 4 MG/2ML IJ SOLN
INTRAMUSCULAR | Status: AC
  Filled 2017-02-11: qty 2

## 2017-02-11 MED ORDER — PROPOFOL 10 MG/ML IV BOLUS
INTRAVENOUS | Status: DC | PRN
  Administered 2017-02-11: 150 mg via INTRAVENOUS

## 2017-02-11 MED ORDER — APIXABAN 5 MG PO TABS
5.0000 mg | ORAL_TABLET | Freq: Two times a day (BID) | ORAL | Status: DC
  Administered 2017-02-12 – 2017-02-13 (×2): 5 mg via ORAL
  Filled 2017-02-11 (×2): qty 1

## 2017-02-11 MED ORDER — ALUM & MAG HYDROXIDE-SIMETH 200-200-20 MG/5ML PO SUSP: 30.0000 mL | ORAL | Status: DC | PRN

## 2017-02-11 MED ORDER — MORPHINE SULFATE (PF) 10 MG/ML IV SOLN
INTRAVENOUS | Status: AC
  Filled 2017-02-11: qty 1

## 2017-02-11 MED ORDER — MIDAZOLAM HCL 2 MG/2ML IJ SOLN
INTRAMUSCULAR | Status: AC
  Filled 2017-02-11: qty 2

## 2017-02-11 MED ORDER — FENTANYL CITRATE (PF) 100 MCG/2ML IJ SOLN
INTRAMUSCULAR | Status: AC
  Administered 2017-02-11: 25 ug via INTRAVENOUS
  Filled 2017-02-11: qty 2

## 2017-02-11 MED ORDER — FAMOTIDINE 20 MG PO TABS
ORAL_TABLET | ORAL | Status: AC
  Administered 2017-02-11: 20 mg via ORAL
  Filled 2017-02-11: qty 1

## 2017-02-11 MED ORDER — KETOROLAC TROMETHAMINE 30 MG/ML IJ SOLN
INTRAMUSCULAR | Status: DC | PRN
  Administered 2017-02-11: 30 mg via INTRAVENOUS

## 2017-02-11 MED ORDER — ACETAMINOPHEN 10 MG/ML IV SOLN
INTRAVENOUS | Status: AC
  Filled 2017-02-11: qty 100

## 2017-02-11 MED ORDER — ONDANSETRON HCL 4 MG/2ML IJ SOLN
4.0000 mg | Freq: Four times a day (QID) | INTRAMUSCULAR | Status: DC | PRN
  Administered 2017-02-13: 4 mg via INTRAVENOUS
  Filled 2017-02-11: qty 2

## 2017-02-11 MED ORDER — ACETAMINOPHEN 650 MG RE SUPP: 650.0000 mg | RECTAL | Status: DC | PRN

## 2017-02-11 MED ORDER — ZOLPIDEM TARTRATE 5 MG PO TABS
5.0000 mg | ORAL_TABLET | Freq: Every evening | ORAL | Status: DC | PRN
  Administered 2017-02-11: 5 mg via ORAL
  Filled 2017-02-11: qty 1

## 2017-02-11 MED ORDER — OXYCODONE HCL 5 MG PO TABS
10.0000 mg | ORAL_TABLET | ORAL | Status: DC | PRN
  Administered 2017-02-11 – 2017-02-13 (×7): 10 mg via ORAL
  Filled 2017-02-11 (×7): qty 2

## 2017-02-11 MED ORDER — CEFAZOLIN SODIUM-DEXTROSE 2-4 GM/100ML-% IV SOLN
INTRAVENOUS | Status: AC
  Filled 2017-02-11: qty 100

## 2017-02-11 MED ORDER — SODIUM CHLORIDE 0.9 % IV SOLN
INTRAVENOUS | Status: DC
  Administered 2017-02-11 (×2): via INTRAVENOUS

## 2017-02-11 MED ORDER — MENTHOL 3 MG MT LOZG
1.0000 | LOZENGE | OROMUCOSAL | Status: DC | PRN
  Filled 2017-02-11: qty 9

## 2017-02-11 MED ORDER — PROPRANOLOL HCL ER 80 MG PO CP24
80.0000 mg | ORAL_CAPSULE | Freq: Every day | ORAL | Status: DC
  Administered 2017-02-12 – 2017-02-13 (×2): 80 mg via ORAL
  Filled 2017-02-11 (×2): qty 1

## 2017-02-11 MED ORDER — DEXAMETHASONE SODIUM PHOSPHATE 10 MG/ML IJ SOLN
INTRAMUSCULAR | Status: AC
  Filled 2017-02-11: qty 1

## 2017-02-11 MED ORDER — LACTATED RINGERS IV SOLN
INTRAVENOUS | Status: DC
  Administered 2017-02-11: 07:00:00 via INTRAVENOUS

## 2017-02-11 MED ORDER — FENTANYL CITRATE (PF) 100 MCG/2ML IJ SOLN
INTRAMUSCULAR | Status: AC
  Filled 2017-02-11: qty 2

## 2017-02-11 MED ORDER — BUPIVACAINE-EPINEPHRINE (PF) 0.25% -1:200000 IJ SOLN
INTRAMUSCULAR | Status: AC
  Filled 2017-02-11: qty 30

## 2017-02-11 MED ORDER — OXYCODONE HCL 5 MG PO TABS
5.0000 mg | ORAL_TABLET | ORAL | Status: DC | PRN
  Administered 2017-02-12 – 2017-02-13 (×2): 5 mg via ORAL
  Filled 2017-02-11 (×3): qty 1

## 2017-02-11 MED ORDER — FENTANYL CITRATE (PF) 100 MCG/2ML IJ SOLN
INTRAMUSCULAR | Status: DC | PRN
  Administered 2017-02-11 (×3): 50 ug via INTRAVENOUS

## 2017-02-11 MED ORDER — PROPOFOL 500 MG/50ML IV EMUL
INTRAVENOUS | Status: AC
  Filled 2017-02-11: qty 50

## 2017-02-11 MED ORDER — FLECAINIDE ACETATE 50 MG PO TABS
50.0000 mg | ORAL_TABLET | Freq: Two times a day (BID) | ORAL | Status: DC
  Administered 2017-02-11 – 2017-02-13 (×4): 50 mg via ORAL
  Filled 2017-02-11 (×6): qty 1

## 2017-02-11 MED ORDER — ACETAMINOPHEN 10 MG/ML IV SOLN
INTRAVENOUS | Status: DC | PRN
  Administered 2017-02-11: 1000 mg via INTRAVENOUS

## 2017-02-11 MED ORDER — VITAMIN D (ERGOCALCIFEROL) 1.25 MG (50000 UNIT) PO CAPS
50000.0000 [IU] | ORAL_CAPSULE | ORAL | Status: DC
  Administered 2017-02-13: 50000 [IU] via ORAL
  Filled 2017-02-11: qty 1

## 2017-02-11 MED ORDER — DEXAMETHASONE SODIUM PHOSPHATE 10 MG/ML IJ SOLN
INTRAMUSCULAR | Status: DC | PRN
  Administered 2017-02-11: 10 mg via INTRAVENOUS

## 2017-02-11 MED ORDER — SODIUM CHLORIDE 0.9 % IJ SOLN
INTRAMUSCULAR | Status: AC
  Filled 2017-02-11: qty 50

## 2017-02-11 MED ORDER — PHENOL 1.4 % MT LIQD
1.0000 | OROMUCOSAL | Status: DC | PRN
  Filled 2017-02-11: qty 177

## 2017-02-11 MED ORDER — ONDANSETRON HCL 4 MG/2ML IJ SOLN
INTRAMUSCULAR | Status: DC | PRN
  Administered 2017-02-11: 4 mg via INTRAVENOUS

## 2017-02-11 MED ORDER — MIDAZOLAM HCL 2 MG/2ML IJ SOLN
INTRAMUSCULAR | Status: DC | PRN
  Administered 2017-02-11: 2 mg via INTRAVENOUS

## 2017-02-11 MED ORDER — PHENYLEPHRINE HCL 10 MG/ML IJ SOLN
INTRAMUSCULAR | Status: DC | PRN
  Administered 2017-02-11 (×3): 100 ug via INTRAVENOUS

## 2017-02-11 MED ORDER — ACETAMINOPHEN 325 MG PO TABS: 650.0000 mg | ORAL_TABLET | ORAL | Status: DC | PRN

## 2017-02-11 MED ORDER — APIXABAN 2.5 MG PO TABS
2.5000 mg | ORAL_TABLET | Freq: Two times a day (BID) | ORAL | Status: AC
  Administered 2017-02-11 – 2017-02-12 (×2): 2.5 mg via ORAL
  Filled 2017-02-11 (×2): qty 1

## 2017-02-11 MED ORDER — SODIUM CHLORIDE 0.9 % IV SOLN
INTRAVENOUS | Status: DC | PRN
  Administered 2017-02-11: 60 mL

## 2017-02-11 MED ORDER — FAMOTIDINE 20 MG PO TABS
20.0000 mg | ORAL_TABLET | Freq: Once | ORAL | Status: AC
Start: 2017-02-11 — End: 2017-02-11
  Administered 2017-02-11: 20 mg via ORAL

## 2017-02-11 MED ORDER — DEXTROSE 5 % IV SOLN
2.0000 g | Freq: Four times a day (QID) | INTRAVENOUS | Status: AC
  Administered 2017-02-11 (×2): 2 g via INTRAVENOUS
  Filled 2017-02-11 (×2): qty 2000

## 2017-02-11 MED ORDER — METHOCARBAMOL 500 MG PO TABS
500.0000 mg | ORAL_TABLET | Freq: Four times a day (QID) | ORAL | Status: DC | PRN
  Administered 2017-02-12 – 2017-02-13 (×2): 500 mg via ORAL
  Filled 2017-02-11 (×2): qty 1

## 2017-02-11 MED ORDER — METOCLOPRAMIDE HCL 10 MG PO TABS: 5.0000 mg | ORAL_TABLET | Freq: Three times a day (TID) | ORAL | Status: DC | PRN

## 2017-02-11 MED ORDER — MORPHINE SULFATE (PF) 2 MG/ML IV SOLN: 2.0000 mg | INTRAVENOUS | Status: DC | PRN

## 2017-02-11 MED ORDER — ONDANSETRON HCL 4 MG/2ML IJ SOLN: 4.0000 mg | Freq: Once | INTRAMUSCULAR | Status: DC | PRN

## 2017-02-11 MED ORDER — LIDOCAINE HCL (CARDIAC) 20 MG/ML IV SOLN
INTRAVENOUS | Status: DC | PRN
  Administered 2017-02-11: 100 mg via INTRAVENOUS

## 2017-02-11 MED ORDER — METHOCARBAMOL 1000 MG/10ML IJ SOLN
500.0000 mg | Freq: Four times a day (QID) | INTRAVENOUS | Status: DC | PRN
  Filled 2017-02-11: qty 5

## 2017-02-11 MED ORDER — METOCLOPRAMIDE HCL 5 MG/ML IJ SOLN: 5.0000 mg | Freq: Three times a day (TID) | INTRAMUSCULAR | Status: DC | PRN

## 2017-02-11 MED ORDER — FENTANYL CITRATE (PF) 100 MCG/2ML IJ SOLN
25.0000 ug | INTRAMUSCULAR | Status: DC | PRN
  Administered 2017-02-11 (×4): 25 ug via INTRAVENOUS

## 2017-02-11 MED ORDER — LIDOCAINE HCL (PF) 2 % IJ SOLN
INTRAMUSCULAR | Status: AC
  Filled 2017-02-11: qty 10

## 2017-02-11 MED ORDER — NEOMYCIN-POLYMYXIN B GU 40-200000 IR SOLN
Status: AC
  Filled 2017-02-11: qty 20

## 2017-02-11 MED ORDER — ONDANSETRON HCL 4 MG PO TABS: 4.0000 mg | ORAL_TABLET | Freq: Four times a day (QID) | ORAL | Status: DC | PRN

## 2017-02-11 MED ORDER — BUPIVACAINE LIPOSOME 1.3 % IJ SUSP
INTRAMUSCULAR | Status: AC
  Filled 2017-02-11: qty 20

## 2017-02-11 MED ORDER — MAGNESIUM CITRATE PO SOLN
1.0000 | Freq: Once | ORAL | Status: DC | PRN
  Filled 2017-02-11: qty 296

## 2017-02-11 MED ORDER — DOCUSATE SODIUM 100 MG PO CAPS
100.0000 mg | ORAL_CAPSULE | Freq: Two times a day (BID) | ORAL | Status: DC
  Administered 2017-02-11 – 2017-02-13 (×4): 100 mg via ORAL
  Filled 2017-02-11 (×4): qty 1

## 2017-02-11 MED ORDER — CEFAZOLIN SODIUM-DEXTROSE 2-4 GM/100ML-% IV SOLN: 2.0000 g | Freq: Four times a day (QID) | INTRAVENOUS | Status: DC

## 2017-02-11 MED ORDER — BISACODYL 10 MG RE SUPP: 10.0000 mg | Freq: Every day | RECTAL | Status: DC | PRN

## 2017-02-11 MED ORDER — DULOXETINE HCL 20 MG PO CPEP
40.0000 mg | ORAL_CAPSULE | Freq: Every day | ORAL | Status: DC
  Administered 2017-02-13: 40 mg via ORAL
  Filled 2017-02-11 (×3): qty 2

## 2017-02-11 SURGICAL SUPPLY — 56 items
BANDAGE ACE 6X5 VEL STRL LF (GAUZE/BANDAGES/DRESSINGS) ×3 IMPLANT
BLADE SAW 1 (BLADE) ×3 IMPLANT
BLADE SAW 1/2 (BLADE) ×3 IMPLANT
CANISTER SUCT 1200ML W/VALVE (MISCELLANEOUS) ×3 IMPLANT
CANISTER SUCT 3000ML PPV (MISCELLANEOUS) ×6 IMPLANT
CHLORAPREP W/TINT 26ML (MISCELLANEOUS) ×3 IMPLANT
COOLER POLAR GLACIER W/PUMP (MISCELLANEOUS) ×3 IMPLANT
COVER TABLE BACK 60X90 (DRAPES) IMPLANT
CUFF TOURN 24 STER (MISCELLANEOUS) ×3 IMPLANT
DRAPE SHEET LG 3/4 BI-LAMINATE (DRAPES) ×12 IMPLANT
ELECT CAUTERY BLADE 6.4 (BLADE) ×3 IMPLANT
ELECT REM PT RETURN 9FT ADLT (ELECTROSURGICAL) ×3
ELECTRODE REM PT RTRN 9FT ADLT (ELECTROSURGICAL) ×1 IMPLANT
GAUZE PETRO XEROFOAM 1X8 (MISCELLANEOUS) ×3 IMPLANT
GAUZE SPONGE 4X4 12PLY STRL (GAUZE/BANDAGES/DRESSINGS) ×3 IMPLANT
GLOVE BIOGEL PI IND STRL 9 (GLOVE) ×1 IMPLANT
GLOVE BIOGEL PI INDICATOR 9 (GLOVE) ×2
GLOVE INDICATOR 8.0 STRL GRN (GLOVE) ×3 IMPLANT
GLOVE SURG ORTHO 8.0 STRL STRW (GLOVE) ×3 IMPLANT
GLOVE SURG SYN 9.0  PF PI (GLOVE) ×2
GLOVE SURG SYN 9.0 PF PI (GLOVE) ×1 IMPLANT
GOWN SRG 2XL LVL 4 RGLN SLV (GOWNS) ×1 IMPLANT
GOWN STRL NON-REIN 2XL LVL4 (GOWNS) ×2
GOWN STRL REUS W/ TWL LRG LVL3 (GOWN DISPOSABLE) ×1 IMPLANT
GOWN STRL REUS W/ TWL XL LVL3 (GOWN DISPOSABLE) ×1 IMPLANT
GOWN STRL REUS W/TWL LRG LVL3 (GOWN DISPOSABLE) ×2
GOWN STRL REUS W/TWL XL LVL3 (GOWN DISPOSABLE) ×2
HOLDER FOLEY CATH W/STRAP (MISCELLANEOUS) ×3 IMPLANT
HOOD PEEL AWAY FLYTE STAYCOOL (MISCELLANEOUS) ×6 IMPLANT
IMMBOLIZER KNEE 19 BLUE UNIV (SOFTGOODS) ×3 IMPLANT
KIT RM TURNOVER STRD PROC AR (KITS) ×3 IMPLANT
KNEE TIBIAL INSERT FXD 17MM S3 (Insert) ×3 IMPLANT
KNIFE SCULPS 14X20 (INSTRUMENTS) ×3 IMPLANT
NEEDLE SPNL 18GX3.5 QUINCKE PK (NEEDLE) ×3 IMPLANT
NEEDLE SPNL 20GX3.5 QUINCKE YW (NEEDLE) ×3 IMPLANT
NS IRRIG 1000ML POUR BTL (IV SOLUTION) ×3 IMPLANT
PACK TOTAL KNEE (MISCELLANEOUS) ×3 IMPLANT
PAD WRAPON POLAR KNEE (MISCELLANEOUS) ×1 IMPLANT
PULSAVAC PLUS IRRIG FAN TIP (DISPOSABLE) ×3
SOL .9 NS 3000ML IRR  AL (IV SOLUTION) ×2
SOL .9 NS 3000ML IRR UROMATIC (IV SOLUTION) ×1 IMPLANT
STAPLER SKIN PROX 35W (STAPLE) ×3 IMPLANT
SUCTION FRAZIER HANDLE 10FR (MISCELLANEOUS) ×2
SUCTION TUBE FRAZIER 10FR DISP (MISCELLANEOUS) ×1 IMPLANT
SUT DVC 2 QUILL PDO  T11 36X36 (SUTURE) ×2
SUT DVC 2 QUILL PDO T11 36X36 (SUTURE) ×1 IMPLANT
SUT DVC QUILL MONODERM 30X30 (SUTURE) ×3 IMPLANT
SUT TICRON 2-0 30IN 311381 (SUTURE) IMPLANT
SWAB CULTURE AMIES ANAERIB BLU (MISCELLANEOUS) IMPLANT
SYR 20CC LL (SYRINGE) ×3 IMPLANT
SYR 50ML LL SCALE MARK (SYRINGE) ×6 IMPLANT
TIP FAN IRRIG PULSAVAC PLUS (DISPOSABLE) ×1 IMPLANT
TOWEL OR 17X26 4PK STRL BLUE (TOWEL DISPOSABLE) ×3 IMPLANT
TOWER CARTRIDGE SMART MIX (DISPOSABLE) ×3 IMPLANT
TRAY FOLEY W/METER SILVER 16FR (SET/KITS/TRAYS/PACK) ×3 IMPLANT
WRAPON POLAR PAD KNEE (MISCELLANEOUS) ×3

## 2017-02-11 NOTE — Evaluation (Signed)
Physical Therapy Evaluation Patient Details Name: Gina Frazier MRN: [id number] DOB: 05-May-1945 Today's Date: 02/11/2017   History of Present Illness  Pt is a 71 y.o. female s/p L total knee revision, polyethelene exchange 02/11/17.  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  Clinical Impression  Prior to hospital admission, pt was independent ambulating without AD.  Pt lives with family (just moved into apt; level entry).  Currently pt is min assist supine to sit, min assist with transfers, and CGA ambulating bed to recliner with RW.  Pt's L knee pain 7/10 with functional mobility but decreased to 4/10 end of session resting in chair.  Pt demonstrating impaired L knee extension (pt reports lacking knee extension prior to surgery today).  Pt would benefit from skilled PT to address noted impairments and functional limitations (see below for any additional details).  Upon hospital discharge, recommend pt discharge to home with HHPT and support of family.    Follow Up Recommendations Home health PT    Equipment Recommendations  Rolling walker with 5" wheels;3in1 (PT)    Recommendations for Other Services OT consult     Precautions / Restrictions Precautions Precautions: Fall;Knee Precaution Booklet Issued: Yes (comment) Restrictions Weight Bearing Restrictions: Yes LLE Weight Bearing: Weight bearing as tolerated      Mobility  Bed Mobility Overal bed mobility: Needs Assistance Bed Mobility: Supine to Sit     Supine to sit: Min assist;HOB elevated     General bed mobility comments: assist for L LE; increased time and effort to perform  Transfers Overall transfer level: Needs assistance Equipment used: Rolling walker (2 wheeled) Transfers: Sit to/from Stand Sit to Stand: Min assist         General transfer comment: assist to initiate stand; occasional vc's for transfer technique (UE/LE positioning)  Ambulation/Gait Ambulation/Gait assistance: Min  guard Ambulation Distance (Feet): 3 Feet(bed to recliner) Assistive device: Rolling walker (2 wheeled)   Gait velocity: decreased   General Gait Details: antalgic; decreased stance time L LE; minimal vc's for gait technique  Stairs            Wheelchair Mobility    Modified Rankin (Stroke Patients Only)       Balance Overall balance assessment: Needs assistance Sitting-balance support: No upper extremity supported;Feet supported Sitting balance-Leahy Scale: Good Sitting balance - Comments: sitting reaching within BOS   Standing balance support: Bilateral upper extremity supported(on RW) Standing balance-Leahy Scale: Poor Standing balance comment: requires UE support on RW for static standing balance                             Pertinent Vitals/Pain Pain Assessment: 0-10 Pain Score: 4 (7/10 with activity; 4/10 at rest end of session) Pain Descriptors / Indicators: Sore Pain Intervention(s): Limited activity within patient's tolerance;Monitored during session;Premedicated before session;Repositioned;Ice applied  Vitals (HR and O2 on room air) stable and WFL throughout treatment session.    Home Living Family/patient expects to be discharged to:: Private residence Living Arrangements: Children Available Help at Discharge: Family Type of Home: Apartment(1st floor) Home Access: Level entry     Home Layout: One level Home Equipment: [place] - 2 wheels;Cane - single point;Bedside commode Additional Comments: Just moved into an apt and sleeping on blow-up bed in living room.    Prior Function Level of Independence: Independent         Comments: Independent with ADL's/IADL's; denies any falls in past 6 months.  Hand Dominance        Extremity/Trunk Assessment   Upper Extremity Assessment Upper Extremity Assessment: Overall WFL for tasks assessed    Lower Extremity Assessment Lower Extremity Assessment: (R LE WFL; L LE hip flexion, knee  flexion/extension at least 2+/5--limited d/t pain); DF/PF at least 3/5)    Cervical / Trunk Assessment Cervical / Trunk Assessment: Normal  Communication   Communication: No difficulties  Cognition Arousal/Alertness: Awake/alert Behavior During Therapy: WFL for tasks assessed/performed Overall Cognitive Status: Within Functional Limits for tasks assessed                                        General Comments General comments (skin integrity, edema, etc.): L LE dressings and polar care in place; foley catheter in place.  Nursing cleared pt for participation in physical therapy.  Pt agreeable to PT session.    Exercises Total Joint Exercises Ankle Circles/Pumps: AROM;Strengthening;Both;10 reps;Supine Quad Sets: AROM;Strengthening;Both;10 reps;Supine Short Arc Quad: AAROM;Strengthening;Left;10 reps;Supine Heel Slides: AAROM;Strengthening;Left;10 reps;Supine Hip ABduction/ADduction: AAROM;Strengthening;Left;10 reps;Supine Straight Leg Raises: AAROM;Strengthening;Left;10 reps;Supine Goniometric ROM: L knee extension 18 degrees short of neutral semi-supine in bed;  L knee flexion 73 degrees in sitting   Assessment/Plan    PT Assessment Patient needs continued PT services  PT Problem List Decreased strength;Decreased range of motion;Decreased activity tolerance;Decreased balance;Decreased mobility;Pain       PT Treatment Interventions DME instruction;Gait training;Functional mobility training;Therapeutic activities;Therapeutic exercise;Balance training;Patient/family education    PT Goals (Current goals can be found in the Care Plan section)  Acute Rehab PT Goals Patient Stated Goal: to go home PT Goal Formulation: With patient Time For Goal Achievement: 02/25/17 Potential to Achieve Goals: Fair    Frequency BID   Barriers to discharge        Co-evaluation               AM-PAC PT "6 Clicks" Daily Activity  Outcome Measure Difficulty turning over in  bed (including adjusting bedclothes, sheets and blankets)?: A Little Difficulty moving from lying on back to sitting on the side of the bed? : Unable Difficulty sitting down on and standing up from a chair with arms (e.g., wheelchair, bedside commode, etc,.)?: Unable Help needed moving to and from a bed to chair (including a wheelchair)?: A Little Help needed walking in hospital room?: A Little Help needed climbing 3-5 steps with a railing? : A Lot 6 Click Score: 13    End of Session Equipment Utilized During Treatment: Gait belt Activity Tolerance: Patient limited by pain Patient left: in chair;with call bell/phone within reach;with chair alarm set(towel rolls under B heels; polar care in place and activated; nursing cleared PT to keep SCD's off) Nurse Communication: Mobility status;Precautions;Patient requests pain meds;Weight bearing status PT Visit Diagnosis: Other abnormalities of gait and mobility (R26.89);Muscle weakness (generalized) (M62.81);Pain Pain - Right/Left: Left Pain - part of body: Knee    Time: 1415-1453 PT Time Calculation (min) (ACUTE ONLY): 38 min   Charges:   PT Evaluation $PT Eval Low Complexity: 1 Low PT Treatments $Therapeutic Exercise: 23-37 mins   PT G Codes:   PT G-Codes **NOT FOR INPATIENT CLASS** Functional Assessment Tool Used: AM-PAC 6 Clicks Basic Mobility Functional Limitation: Mobility: Walking and moving around Mobility: Walking and Moving Around Current Status (Z0258): At least 40 percent but less than 60 percent impaired, limited or restricted Mobility: Walking and Moving Around Goal Status [phone]): At  least 1 percent but less than 20 percent impaired, limited or restricted    Leitha Bleak, PT 02/11/17, 3:42 PM [phone]

## 2017-02-11 NOTE — Progress Notes (Addendum)
ADMISSION  NOTE:  Pt admitted to room 156 from PACU. No complaints of pain at this time. Surgical dressing clean dry and intact. Skin assessment completed with Tiffiany, RN.  Pt alert and oriented X4. Family at the bedside. Call bell in reach bed in lowest position and bed alarm on.

## 2017-02-11 NOTE — Anesthesia Post-op Follow-up Note (Signed)
Anesthesia QCDR form completed.        

## 2017-02-11 NOTE — Anesthesia Preprocedure Evaluation (Signed)
Anesthesia Evaluation  Patient identified by MRN, date of birth, ID band Patient awake    Reviewed: Allergy & Precautions, NPO status , Patient's Chart, lab work & pertinent test results  History of Anesthesia Complications (+) Family history of anesthesia reactionNegative for: history of anesthetic complications  Airway Mallampati: III       Dental  (+) Partial Lower, Partial Upper   Pulmonary neg sleep apnea, neg COPD,           Cardiovascular hypertension, Pt. on medications (-) Past MI and (-) CHF + dysrhythmias (hx of afib, currently sinus) Atrial Fibrillation (-) Valvular Problems/Murmurs     Neuro/Psych neg Seizures Anxiety    GI/Hepatic Neg liver ROS, neg GERD  ,  Endo/Other  neg diabetes  Renal/GU negative Renal ROS     Musculoskeletal   Abdominal   Peds  Hematology   Anesthesia Other Findings   Reproductive/Obstetrics                             Anesthesia Physical Anesthesia Plan  ASA: III  Anesthesia Plan: General   Post-op Pain Management:    Induction: Intravenous  PONV Risk Score and Plan: 3 and Dexamethasone, Ondansetron, Midazolam and Treatment may vary due to age or medical condition  Airway Management Planned: LMA  Additional Equipment:   Intra-op Plan:   Post-operative Plan:   Informed Consent: I have reviewed the patients History and Physical, chart, labs and discussed the procedure including the risks, benefits and alternatives for the proposed anesthesia with the patient or authorized representative who has indicated his/her understanding and acceptance.     Plan Discussed with:   Anesthesia Plan Comments:         Anesthesia Quick Evaluation

## 2017-02-11 NOTE — Transfer of Care (Signed)
Immediate Anesthesia Transfer of Care Note  Patient: Gina Frazier  Procedure(s) Performed: TOTAL KNEE REVISION, POLYETHELENE EXCHANGE (Left )  Patient Location: PACU  Anesthesia Type:General  Level of Consciousness: sedated  Airway & Oxygen Therapy: Patient Spontanous Breathing and Patient connected to face mask oxygen  Post-op Assessment: Report given to RN and Post -op Vital signs reviewed and stable  Post vital signs: Reviewed and stable  Last Vitals:  Vitals:   02/11/17 0614  BP: (!) 144/60  Pulse: 66  Resp: 18  Temp: 36.6 C  SpO2: 99%    Last Pain:  Vitals:   02/11/17 0614  TempSrc: Tympanic  PainSc: 7          Complications: No apparent anesthesia complications

## 2017-02-11 NOTE — H&P (Signed)
Reviewed paper H+P, will be scanned into chart. No changes noted.  

## 2017-02-11 NOTE — Anesthesia Procedure Notes (Signed)
Procedure Name: LMA Insertion Date/Time: 02/11/2017 7:46 AM Performed by: Nelda Marseille, CRNA Pre-anesthesia Checklist: Patient identified, Patient being monitored, Timeout performed, Emergency Drugs available and Suction available Patient Re-evaluated:Patient Re-evaluated prior to induction Oxygen Delivery Method: Circle system utilized Preoxygenation: Pre-oxygenation with 100% oxygen Induction Type: IV induction Ventilation: Mask ventilation without difficulty LMA: LMA inserted LMA Size: 3.5 Tube type: Oral Number of attempts: 1 Placement Confirmation: positive ETCO2 and breath sounds checked- equal and bilateral Tube secured with: Tape Dental Injury: Teeth and Oropharynx as per pre-operative assessment

## 2017-02-11 NOTE — NC FL2 (Signed)
[place] LEVEL OF CARE SCREENING TOOL     IDENTIFICATION  Patient Name: Gina Frazier Birthdate: December 23, 1945 Sex: female Admission Date (Current Location): 02/11/2017  [place] and [place] Number:  Engineering geologist and Address:  [facility], [address] [place], [place] [address]      Provider Number: [id number]  Attending Physician Name and Address:  Hessie Knows, MD  Relative Name and Phone Number:       Current Level of Care: Hospital Recommended Level of Care: [place] Prior Approval Number:    Date Approved/Denied:   PASRR Number: ([id number] )  Discharge Plan: SNF    Current Diagnoses: Patient Active Problem List   Diagnosis Date Noted  . 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 Chronic pain of left knee 02/08/2017  . Weight gain 03/18/2016  . Weakness   . Weight loss   . PAF (paroxysmal atrial fibrillation) ([place])   . Primary osteoarthritis of knee 05/02/2015  . Chest pain 01/28/2015  . Special screening for malignant neoplasms, colon   . Snoring 05/01/2014  . Anxiety 12/30/2010  . Preoperative cardiovascular examination 12/30/2010  . Hypertension 07/21/2010  . Chronic back pain 07/21/2010  . Vitamin D deficiency 07/21/2010  . SHORTNESS OF BREATH 05/01/2010  . Essential hypertension 04/18/2010  . OSTEOARTHROS UNSPEC GEN/LOC OTH SPEC SITES 04/18/2010  . KNEE PAIN, LEFT 04/18/2010    Orientation RESPIRATION BLADDER Height & Weight     Self, Time, Place, Situation  Normal Continent Weight: 166 lb 12.8 oz (75.7 kg) Height:  5\' 2"  (157.5 cm)  BEHAVIORAL SYMPTOMS/MOOD NEUROLOGICAL BOWEL NUTRITION STATUS      Continent Diet(Diet: Clear Liquid to be [facility]. )  AMBULATORY STATUS COMMUNICATION OF NEEDS Skin   Extensive Assist Verbally Surgical wounds(Incision: Left Knee )                       Personal Care Assistance Level of Assistance  Bathing, Feeding,  Dressing Bathing Assistance: Limited assistance Feeding assistance: Independent Dressing Assistance: Limited assistance     Functional Limitations Info  Sight, Hearing, Speech Sight Info: Adequate Hearing Info: Adequate Speech Info: Adequate    SPECIAL CARE FACTORS FREQUENCY  PT (By licensed PT), OT (By licensed OT)     PT Frequency: (5) OT Frequency: (5)            Contractures      Additional Factors Info  Code Status, Allergies Code Status Info: (Full Code. ) Allergies Info: (Fish Allergy. )           Current Medications (02/11/2017):  This is the current hospital active medication list Current Facility-Administered Medications  Medication Dose Route Frequency Provider Last Rate Last Dose  . 0.9 %  sodium chloride infusion   Intravenous Continuous Hessie Knows, MD 75 mL/hr at 02/11/17 1006    . acetaminophen (TYLENOL) tablet 650 mg  650 mg Oral Q4H PRN Hessie Knows, MD       Or  . acetaminophen (TYLENOL) suppository 650 mg  650 mg Rectal Q4H PRN Hessie Knows, MD      . alum & mag hydroxide-simeth (MAALOX/MYLANTA) 200-200-20 MG/5ML suspension 30 mL  30 mL Oral Q4H PRN Hessie Knows, MD      . apixaban Ar[facility]) tablet 2.5 mg  2.5 mg Oral BID Hessie Knows, MD      . Derrill Memo ON 02/12/2017] apixaban (ELIQUIS) tablet 5 mg  5 mg Oral BID Hessie Knows,  MD      . bisacodyl (DULCOLAX) suppository 10 mg  10 mg Rectal Daily PRN Hessie Knows, MD      . ceFAZolin (ANCEF) 2 g in dextrose 5 % 100 mL IVPB  2 g Intravenous Q6H Hessie Knows, MD      . docusate sodium (COLACE) capsule 100 mg  100 mg Oral BID Hessie Knows, MD      . DULoxetine (CYMBALTA) DR capsule 40 mg  40 mg Oral Daily Hessie Knows, MD      . flecainide [facility]) tablet 50 mg  50 mg Oral BID Hessie Knows, MD      . magnesium citrate solution 1 Bottle  1 Bottle Oral Once PRN Hessie Knows, MD      . magnesium hydroxide (MILK OF MAGNESIA) suspension 30 mL  30 mL Oral Daily PRN Hessie Knows, MD      .  menthol-cetylpyridinium (CEPACOL) lozenge 3 mg  1 lozenge Oral PRN Hessie Knows, MD       Or  . phenol (CHLORASEPTIC) mouth spray 1 spray  1 spray Mouth/Throat PRN Hessie Knows, MD      . methocarbamol (ROBAXIN) tablet 500 mg  500 mg Oral Q6H PRN Hessie Knows, MD       Or  . methocarbamol (ROBAXIN) 500 mg in dextrose 5 % 50 mL IVPB  500 mg Intravenous Q6H PRN Hessie Knows, MD      . metoCLOPramide (REGLAN) tablet 5-10 mg  5-10 mg Oral Q8H PRN Hessie Knows, MD       Or  . metoCLOPramide (REGLAN) injection 5-10 mg  5-10 mg Intravenous Q8H PRN Hessie Knows, MD      . morphine 2 MG/ML injection 2 mg  2 mg Intravenous Q1H PRN Hessie Knows, MD      . ondansetron [facility]) tablet 4 mg  4 mg Oral Q6H PRN Hessie Knows, MD       Or  . ondansetron [facility]) injection 4 mg  4 mg Intravenous Q6H PRN Hessie Knows, MD      . oxyCODONE (Oxy IR/ROXICODONE) immediate release tablet 10 mg  10 mg Oral Q3H PRN Hessie Knows, MD   10 mg at 02/11/17 1131  . oxyCODONE (Oxy IR/ROXICODONE) immediate release tablet 5 mg  5 mg Oral Q3H PRN Hessie Knows, MD      . Derrill Memo ON 02/12/2017] propranolol ER (INDERAL LA) 24 hr capsule 80 mg  80 mg Oral Daily Hessie Knows, MD      . Derrill Memo ON 02/13/2017] Vitamin D (Ergocalciferol) (DRISDOL) capsule 50,000 Units  50,000 Units Oral Weekly Hessie Knows, MD      . zolpidem (AMBIEN) tablet 5 mg  5 mg Oral QHS PRN Hessie Knows, MD         Discharge Medications: Please see discharge summary for a list of discharge medications.  Relevant Imaging Results:  Relevant Lab Results:   Additional Information (SSN: [id number])  Izamar Linden, Veronia Beets, LCSW

## 2017-02-11 NOTE — Anesthesia Postprocedure Evaluation (Signed)
Anesthesia Post Note  Patient: Gina Frazier  Procedure(s) Performed: TOTAL KNEE REVISION, POLYETHELENE EXCHANGE (Left )  Patient location during evaluation: PACU Anesthesia Type: General Level of consciousness: awake and alert Pain management: pain level controlled Vital Signs Assessment: post-procedure vital signs reviewed and stable Respiratory status: spontaneous breathing and respiratory function stable Cardiovascular status: stable Anesthetic complications: no     Last Vitals:  Vitals:   02/11/17 0905 02/11/17 0910  BP: (!) 171/73   Pulse: 68 69  Resp: (!) 21 13  Temp:    SpO2: 99% 90%    Last Pain:  Vitals:   02/11/17 0910  TempSrc:   PainSc: 7                  KEPHART,WILLIAM K

## 2017-02-11 NOTE — Op Note (Signed)
02/11/2017  8:55 AM  PATIENT:  Gina Frazier  71 y.o. female  PRE-OPERATIVE DIAGNOSIS:  PAIN DUE TO TOTAL LEFT KNEE REPLACEMENT  POST-OPERATIVE DIAGNOSIS:  PAIN DUE TO TOTAL LEFT KNEE REPLACEMENT  PROCEDURE:  Procedure(s): TOTAL KNEE REVISION, POLYETHELENE EXCHANGE (Left)  SURGEON: Laurene Footman, MD  ASSISTANTS: Rachelle Hora [facility]  ANESTHESIA:   spinal  EBL:  Total I/O In: 800 [I.V.:800] Out: 225 [Urine:175; Blood:50]  BLOOD ADMINISTERED:none  DRAINS: none   LOCAL MEDICATIONS USED:  OTHER Exparel  SPECIMEN:  No Specimen  DISPOSITION OF SPECIMEN:  N/A  COUNTS:  YES  TOURNIQUET:  * Missing tourniquet times found for documented tourniquets in log: [id number] *15 minutes at 300 mmHg  IMPLANTS: Medacta GMK size 317 mm PS with screw  DICTATION: .Dragon Dictation patient brought the operating room and after adequate anesthesia was obtained the left leg was prepped draped in sterile fashion. After patient identification and timeout procedures were completed the prior incision was utilized with some widened areas except elliptically excised. A medial parapatellar arthrotomy was then performed and there was marked synovitis within the joint and the gutters and around the notch of the femur. This was debrided with cautery and hemostasis achieved by electrocautery the metal implants appeared stable as to the patella the prior polyethylene in part implant was removed and debridement of posterior synovitis also carried out. The 14 and 17 mm trial implants were placed and with 14 mm implant there is still some valgus instability in mid flexion so the 17 mm implant was chosen. At this point the tourniquet had been raised and X Burrell injected in the para-articular tissue for postop analgesia and the knee thoroughly irrigated the 17 mm insert was then placed with set screw using the torque screwdriver. Tourniquet was let down and there was full range of motion present with good tracking of the  patella. The arthrotomy was repaired using a heavy Quill followed by a 3-0 tv- lock subcuticular closure followed by skin staples. Xeroform 4 x 4's ABDs and web roll Polar Care and Ace wrap applied  PLAN OF CARE: Admit to inpatient   PATIENT DISPOSITION:  PACU - hemodynamically stable.

## 2017-02-12 LAB — CBC
HCT: 34.6 % — ABNORMAL LOW (ref 35.0–47.0)
Hemoglobin: 11.2 g/dL — ABNORMAL LOW (ref 12.0–16.0)
MCH: 29.6 pg (ref 26.0–34.0)
MCHC: 32.4 g/dL (ref 32.0–36.0)
MCV: 91.3 fL (ref 80.0–100.0)
Platelets: 193 10*3/uL (ref 150–440)
RBC: 3.79 MIL/uL — AB (ref 3.80–5.20)
RDW: 13.3 % (ref 11.5–14.5)
WBC: 8.9 10*3/uL (ref 3.6–11.0)

## 2017-02-12 LAB — BASIC METABOLIC PANEL
ANION GAP: 7 (ref 5–15)
BUN: 17 mg/dL (ref 6–20)
CALCIUM: 8.5 mg/dL — AB (ref 8.9–10.3)
CO2: 27 mmol/L (ref 22–32)
Chloride: 105 mmol/L (ref 101–111)
Creatinine, Ser: 0.78 mg/dL (ref 0.44–1.00)
GLUCOSE: 131 mg/dL — AB (ref 65–99)
POTASSIUM: 4.4 mmol/L (ref 3.5–5.1)
Sodium: 139 mmol/L (ref 135–145)

## 2017-02-12 MED ORDER — OXYCODONE HCL 5 MG PO TABS: 5.0000 mg | ORAL_TABLET | ORAL | 0 refills | Status: DC | PRN

## 2017-02-12 NOTE — Progress Notes (Signed)
Clinical Social Worker (CSW) received SNF consult. PT is recommending home health. RN case manager aware of above. Please reconsult if future social work needs arise. CSW signing off.   Audree Schrecengost, LCSW [phone] 

## 2017-02-12 NOTE — Progress Notes (Signed)
Physical Therapy Treatment Patient Details Name: Gina Frazier MRN: [id number] DOB: 1946/02/13 Today's Date: 02/12/2017    History of Present Illness Pt is a 71 y.o. female s/p L total knee revision, polyethelene exchange 02/11/17.  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    PT Comments    Pt able to progress to ambulation 90 feet with RW CGA to SBA; also L knee flexion ROM improved to 86 degrees today.  Pt continues to demonstrate impaired L knee extension ROM (pt reports lacking extension prior to this surgery).  Will continue to focus on L knee ROM, strengthening, and progressing ambulation distance per pt tolerance.    Follow Up Recommendations  Home health PT     Equipment Recommendations  Rolling walker with 5" wheels;3in1 (PT)    Recommendations for Other Services OT consult     Precautions / Restrictions Precautions Precautions: Fall;Knee Precaution Booklet Issued: Yes (comment) Restrictions Weight Bearing Restrictions: Yes LLE Weight Bearing: Weight bearing as tolerated    Mobility  Bed Mobility               General bed mobility comments: Deferred d/t pt sitting on BSC upon PT arrival  Transfers Overall transfer level: Needs assistance Equipment used: Rolling walker (2 wheeled) Transfers: Sit to/from Stand Sit to Stand: Min guard         General transfer comment: mild increased effort for sit to/from stands but steady with RW; no vc's required  Ambulation/Gait Ambulation/Gait assistance: Min guard;Supervision Ambulation Distance (Feet): 90 Feet Assistive device: Rolling walker (2 wheeled) Gait Pattern/deviations: Step-to pattern Gait velocity: decreased   General Gait Details: antalgic; mild decreased stance time L LE; minimal vc's for gait technique and walker use   Stairs            Wheelchair Mobility    Modified Rankin (Stroke Patients Only)       Balance Overall balance assessment: Needs  assistance Sitting-balance support: No upper extremity supported;Feet supported Sitting balance-Leahy Scale: Normal Sitting balance - Comments: sitting reaching outside BOS   Standing balance support: Single extremity supported(on RW)   Standing balance comment: requires at least single UE support on RW for static standing balance                            Cognition Arousal/Alertness: Awake/alert Behavior During Therapy: WFL for tasks assessed/performed Overall Cognitive Status: Within Functional Limits for tasks assessed                                        Exercises Total Joint Exercises Long Arc Quad: Strengthening;AAROM;Left;AROM;Right;10 reps;Seated Knee Flexion: AAROM;Left;Seated(x3 reps L knee flexion AAROM x20 second holds for increased ROM) Goniometric ROM: L knee extension 20 degrees short of neutral semi-supine in chair; L knee flexion 86 degrees in sitting General Exercises - Lower Extremity Hip Flexion/Marching: AROM;Strengthening;Both;10 reps;Seated    General Comments General comments (skin integrity, edema, etc.): L LE dressings and polar care in place.  Pt agreeable to PT session.      Pertinent Vitals/Pain Pain Assessment: 0-10 Pain Score: 2  Pain Location: L knee Pain Descriptors / Indicators: Sore Pain Intervention(s): Limited activity within patient's tolerance;Monitored during session;Premedicated before session;Repositioned;Ice applied  Vitals (HR and O2 on room air) stable and WFL throughout treatment session.    Home Living  Prior Function            PT Goals (current goals can now be found in the care plan section) Acute Rehab PT Goals Patient Stated Goal: to go home PT Goal Formulation: With patient Time For Goal Achievement: 02/25/17 Potential to Achieve Goals: Good Progress towards PT goals: Progressing toward goals    Frequency    BID      PT Plan Current plan remains  appropriate    Co-evaluation              AM-PAC PT "6 Clicks" Daily Activity  Outcome Measure  Difficulty turning over in bed (including adjusting bedclothes, sheets and blankets)?: A Little Difficulty moving from lying on back to sitting on the side of the bed? : Unable Difficulty sitting down on and standing up from a chair with arms (e.g., wheelchair, bedside commode, etc,.)?: A Little Help needed moving to and from a bed to chair (including a wheelchair)?: A Little Help needed walking in hospital room?: A Little Help needed climbing 3-5 steps with a railing? : A Lot 6 Click Score: 15    End of Session Equipment Utilized During Treatment: Gait belt Activity Tolerance: Patient tolerated treatment well Patient left: in chair;with call bell/phone within reach;with chair alarm set;with SCD's reapplied(B heels elevated via towel rolls; polar care in place and activated) Nurse Communication: Mobility status;Precautions;Weight bearing status PT Visit Diagnosis: Other abnormalities of gait and mobility (R26.89);Muscle weakness (generalized) (M62.81);Pain Pain - Right/Left: Left Pain - part of body: Knee     Time: 0814-4818 PT Time Calculation (min) (ACUTE ONLY): 24 min  Charges:  $Gait Training: 8-22 mins $Therapeutic Exercise: 8-22 mins                    G CodesLeitha Bleak, PT 02/12/17, 10:35 AM [phone]

## 2017-02-12 NOTE — Progress Notes (Signed)
   Subjective: 1 Day Post-Op Procedure(s) (LRB): TOTAL KNEE REVISION, POLYETHELENE EXCHANGE (Left) Patient reports pain as mild.   Patient is well, and has had no acute complaints or problems Denies any CP, SOB, ABD pain. We will continue therapy today.  Plan is to go Home after hospital stay.  Objective: Vital signs in last 24 hours: Temp:  [97 F (36.1 C)-99.1 F (37.3 C)] 99.1 F (37.3 C) (11/30 0805) Pulse Rate:  [55-73] 72 (11/30 0805) Resp:  [11-21] 16 (11/30 0805) BP: (117-171)/(50-73) 143/56 (11/30 0805) SpO2:  [90 %-100 %] 96 % (11/30 0805) Weight:  [75.7 kg (166 lb 12.8 oz)] 75.7 kg (166 lb 12.8 oz) (11/29 1231)  Intake/Output from previous day: 11/29 0701 - 11/30 0700 In: 2292.5 [I.V.:2292.5] Out: 2965 [Urine:2915; Blood:50] Intake/Output this shift: Total I/O In: -  Out: 150 [Urine:150]  Recent Labs    02/12/17 0335  HGB 11.2*   Recent Labs    02/12/17 0335  WBC 8.9  RBC 3.79*  HCT 34.6*  PLT 193   Recent Labs    02/12/17 0335  NA 139  K 4.4  CL 105  CO2 27  BUN 17  CREATININE 0.78  GLUCOSE 131*  CALCIUM 8.5*   No results for input(s): LABPT, INR in the last 72 hours.  EXAM General - Patient is Alert, Appropriate and Oriented Extremity - Neurovascular intact Sensation intact distally Intact pulses distally Dorsiflexion/Plantar flexion intact No cellulitis present Compartment soft Dressing - dressing C/D/I and no drainage Motor Function - intact, moving foot and toes well on exam.   Past Medical History:  Diagnosis Date  . Anxiety   . Chronic back pain   . Family history of adverse reaction to anesthesia    pts daughter had severe vomiting 2014   . Hip joint pain    left  . Hypertension   . Osteoarthritis   . PAF (paroxysmal atrial fibrillation) ([place])    a. initially diagnosed 04/2014; b. on Eliquis; c. CHADS2VASc = 3  . Palpitations   . Wears contact lenses   . Wears dentures    partial upper    Assessment/Plan:   1  Day Post-Op Procedure(s) (LRB): TOTAL KNEE REVISION, POLYETHELENE EXCHANGE (Left) Active Problems:   Status post revision of total knee replacement, left  Estimated body mass index is 30.51 kg/m as calculated from the following:   Height as of this encounter: 5\' 2"  (1.575 m).   Weight as of this encounter: 75.7 kg (166 lb 12.8 oz). Advance diet Up with therapy  Labs and vital signs within normal limits. Patient doing well, pain controlled with no complaints. Care management to assist with discharge to home with home health PT tomorrow pending progress of physical therapy.   DVT Prophylaxis - Foot Pumps, TED hose and Eliquis Weight-Bearing as tolerated to left leg   T. Rachelle Hora, PA-C [place] 02/12/2017, 8:16 AM

## 2017-02-12 NOTE — Care Management Note (Signed)
Case Management Note  Patient Details  Name: Breonna Gafford MRN: [id number] Date of Birth: 27-Dec-1945  Subjective/Objective: POD # 1 TKR left. Patient will be discharged home tomorrow. Offered choice of home health agencies. Referral to [facility] for HHPT. Patient will need a walker and bsc. Ordered from [facility]. She is on Eliquis. Will be discharge home on Eliquis. Verified address and phone number. Please use cell phone number. [facility] updated on correct phone to use.                      Action/Plan: [facility] for HHPT. Walker and bsc ordered form [facility].   Expected Discharge Date:                  Expected Discharge Plan:  [place]  In-House Referral:     Discharge planning Services  CM Consult  Post Acute Care Choice:  Durable Medical Equipment, Home Health Choice offered to:  Patient  DME Arranged:  Bedside commode, Walker rolling DME Agency:  [place]:  PT [place]:  [facility]  Status of Service:  In process, will continue to follow  If discussed at Long Length of Stay Meetings, dates discussed:    Additional Comments:  Jolly Mango, RN 02/12/2017, 12:13 PM

## 2017-02-12 NOTE — Progress Notes (Signed)
Physical Therapy Treatment Patient Details Name: Gina Frazier MRN: [id number] DOB: 1945-09-05 Today's Date: 02/12/2017    History of Present Illness Pt is a 71 y.o. female s/p L total knee revision, polyethelene exchange 02/11/17.  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    PT Comments    Pt able to progress to ambulating 150 feet with RW CGA to SBA.  Pt tolerated LE ex's in bed fairly well.  Pt's L knee pain 2/10 during/end of session.  Pt reports her daughter is purchasing her a bed that is elevated in height so she can get in/out of it easier and will have it prior to discharge per pt (pt most recently sleeping on air-mattress that is low to ground).  Pt reports no stairs at home.  Will continue to progress pt with strengthening, L knee ROM, and increasing ambulation distance per pt tolerance.    Follow Up Recommendations  Home health PT     Equipment Recommendations  Rolling walker with 5" wheels;3in1 (PT)    Recommendations for Other Services OT consult     Precautions / Restrictions Precautions Precautions: Fall;Knee Precaution Booklet Issued: Yes (comment) Restrictions Weight Bearing Restrictions: Yes LLE Weight Bearing: Weight bearing as tolerated    Mobility  Bed Mobility Overal bed mobility: Needs Assistance Bed Mobility: Supine to Sit;Sit to Supine     Supine to sit: Supervision;HOB elevated Sit to supine: Supervision;HOB elevated   General bed mobility comments: increased effort to perform with bed flat; use of UE's for L LE supine to sit; use of R LE for L LE sit to supine  Transfers Overall transfer level: Needs assistance Equipment used: Rolling walker (2 wheeled) Transfers: Sit to/from Stand Sit to Stand: Supervision         General transfer comment: mild increased effort for sit to/from stands but steady with RW; no vc's required  Ambulation/Gait Ambulation/Gait assistance: Min guard;Supervision Ambulation Distance (Feet): 150  Feet Assistive device: Rolling walker (2 wheeled)   Gait velocity: decreased   General Gait Details: antalgic; mild decreased stance time L LE; decreased R LE step length/partial step through pattern R LE; good L heelstrike; minimal vc's for gait technique and walker use   Stairs            Wheelchair Mobility    Modified Rankin (Stroke Patients Only)       Balance Overall balance assessment: Needs assistance Sitting-balance support: No upper extremity supported;Feet supported Sitting balance-Leahy Scale: Normal Sitting balance - Comments: sitting reaching outside BOS   Standing balance support: Single extremity supported Standing balance-Leahy Scale: Poor Standing balance comment: requires at least single UE support on RW for static standing balance                            Cognition Arousal/Alertness: Awake/alert Behavior During Therapy: WFL for tasks assessed/performed Overall Cognitive Status: Within Functional Limits for tasks assessed                                        Exercises Total Joint Exercises Ankle Circles/Pumps: AROM;Strengthening;Both;10 reps;Supine Quad Sets: AROM;Strengthening;Both;10 reps;Supine Short Arc Quad: AAROM;Strengthening;Left;10 reps;Supine Heel Slides: AAROM;Strengthening;Left;10 reps;Supine Hip ABduction/ADduction: AAROM;Strengthening;Left;10 reps;Supine Straight Leg Raises: AAROM;Strengthening;Left;10 reps;Supine    General Comments General comments (skin integrity, edema, etc.): L LE dressings and polar care in place.  Pt agreeable to PT session.  Pertinent Vitals/Pain Pain Assessment: 0-10 Pain Score: 2  Pain Location: Left Knee Pain Descriptors / Indicators: Sore Pain Intervention(s): Limited activity within patient's tolerance;Monitored during session;Premedicated before session;Repositioned;Ice applied  Vitals (HR and O2 on room air) stable and WFL throughout treatment session.     Home Living    Prior Function    PT Goals (current goals can now be found in the care plan section) Acute Rehab PT Goals Patient Stated Goal: to go home PT Goal Formulation: With patient Time For Goal Achievement: 02/25/17 Potential to Achieve Goals: Good Progress towards PT goals: Progressing toward goals    Frequency    BID      PT Plan Current plan remains appropriate    Co-evaluation              AM-PAC PT "6 Clicks" Daily Activity  Outcome Measure  Difficulty turning over in bed (including adjusting bedclothes, sheets and blankets)?: A Little Difficulty moving from lying on back to sitting on the side of the bed? : A Lot Difficulty sitting down on and standing up from a chair with arms (e.g., wheelchair, bedside commode, etc,.)?: A Little Help needed moving to and from a bed to chair (including a wheelchair)?: A Little Help needed walking in hospital room?: A Little Help needed climbing 3-5 steps with a railing? : A Little 6 Click Score: 17    End of Session Equipment Utilized During Treatment: Gait belt Activity Tolerance: Patient tolerated treatment well Patient left: in bed;with call bell/phone within reach;with bed alarm set;with SCD's reapplied(L LE elevated via bone foam; R LE heel elevated via towel roll; polar care in place and activated) Nurse Communication: Mobility status;Precautions;Weight bearing status PT Visit Diagnosis: Other abnormalities of gait and mobility (R26.89);Muscle weakness (generalized) (M62.81);Pain Pain - Right/Left: Left Pain - part of body: Knee     Time: 1400-1430 PT Time Calculation (min) (ACUTE ONLY): 30 min  Charges:  $Gait Training: 8-22 mins $Therapeutic Exercise: 8-22 mins                    G CodesLeitha Bleak, PT 02/12/17, 2:57 PM [phone]

## 2017-02-12 NOTE — Discharge Instructions (Signed)
° °TOTAL KNEE REVISION POSTOPERATIVE DIRECTIONS ° °Knee Rehabilitation, Guidelines Following Surgery  °Results after knee surgery are often greatly improved when you follow the exercise, range of motion and muscle strengthening exercises prescribed by your doctor. Safety measures are also important to protect the knee from further injury. Any time any of these exercises cause you to have increased pain or swelling in your knee joint, decrease the amount until you are comfortable again and slowly increase them. If you have problems or questions, call your caregiver or physical therapist for advice.  ° °HOME CARE INSTRUCTIONS  °Remove items at home which could result in a fall. This includes throw rugs or furniture in walking pathways.  °· Continue to use polar care unit on the knee for pain and swelling from surgery. You may notice swelling that will progress down to the foot and ankle.  This is normal after surgery.  Elevate the leg when you are not up walking on it.   °· Continue to use the breathing machine which will help keep your temperature down.  It is common for your temperature to cycle up and down following surgery, especially at night when you are not up moving around and exerting yourself.  The breathing machine keeps your lungs expanded and your temperature down. °· Do not place pillow under knee, focus on keeping the knee STRAIGHT while resting ° °DIET °You may resume your previous home diet once your are discharged from the hospital. ° °DRESSING / WOUND CARE / SHOWERING °You may start showering once staples have been removed. Change the surgical dressing as needed. ° °ACTIVITY °Walk with your walker as instructed. °Use walker as long as suggested by your caregivers. °Avoid periods of inactivity such as sitting longer than an hour when not asleep. This helps prevent blood clots.  °You may resume a sexual relationship in one month or when given the OK by your doctor.  °You may return to work once you  are cleared by your doctor.  °Do not drive a car for 6 weeks or until released by you surgeon.  °Do not drive while taking narcotics. ° °WEIGHT BEARING °Weight bearing as tolerated with assist device (walker, cane, etc) as directed, use it as long as suggested by your surgeon or therapist, typically at least 4-6 weeks. ° °POSTOPERATIVE CONSTIPATION PROTOCOL °Constipation - defined medically as fewer than three stools per week and severe constipation as less than one stool per week. ° °One of the most common issues patients have following surgery is constipation.  Even if you have a regular bowel pattern at home, your normal regimen is likely to be disrupted due to multiple reasons following surgery.  Combination of anesthesia, postoperative narcotics, change in appetite and fluid intake all can affect your bowels.  In order to avoid complications following surgery, here are some recommendations in order to help you during your recovery period. ° °Colace (docusate) - Pick up an over-the-counter form of Colace or another stool softener and take twice a day as long as you are requiring postoperative pain medications.  Take with a full glass of water daily.  If you experience loose stools or diarrhea, hold the colace until you stool forms back up.  If your symptoms do not get better within 1 week or if they get worse, check with your doctor. ° °Dulcolax (bisacodyl) - Pick up over-the-counter and take as directed by the product packaging as needed to assist with the movement of your bowels.  Take with a   full glass of water.  Use this product as needed if not relieved by Colace only.  ° °MiraLax (polyethylene glycol) - Pick up over-the-counter to have on hand.  MiraLax is a solution that will increase the amount of water in your bowels to assist with bowel movements.  Take as directed and can mix with a glass of water, juice, soda, coffee, or tea.  Take if you go more than two days without a movement. °Do not use MiraLax  more than once per day. Call your doctor if you are still constipated or irregular after using this medication for 7 days in a row. ° °If you continue to have problems with postoperative constipation, please contact the office for further assistance and recommendations.  If you experience "the worst abdominal pain ever" or develop nausea or vomiting, please contact the office immediatly for further recommendations for treatment. ° °ITCHING ° If you experience itching with your medications, try taking only a single pain pill, or even half a pain pill at a time.  You can also use Benadryl over the counter for itching or also to help with sleep.  ° °TED HOSE STOCKINGS °Wear the elastic stockings on both legs for six weeks following surgery during the day but you may remove then at night for sleeping. ° °MEDICATIONS °See your medication summary on the “After Visit Summary” that the nursing staff will review with you prior to discharge.  You may have some home medications which will be placed on hold until you complete the course of blood thinner medication.  It is important for you to complete the blood thinner medication as prescribed by your surgeon.  Continue your approved medications as instructed at time of discharge. ° °PRECAUTIONS °If you experience chest pain or shortness of breath - call 911 immediately for transfer to the hospital emergency department.  °If you develop a fever greater that 101 F, purulent drainage from wound, increased redness or drainage from wound, foul odor from the wound/dressing, or calf pain - CONTACT YOUR SURGEON.   °                                                °FOLLOW-UP APPOINTMENTS °Make sure you keep all of your appointments after your operation with your surgeon and caregivers. You should call the office at the above phone number and make an appointment for approximately two weeks after the date of your surgery or on the date instructed by your surgeon outlined in the "After  Visit Summary". ° ° °RANGE OF MOTION AND STRENGTHENING EXERCISES  °Rehabilitation of the knee is important following a knee injury or an operation. After just a few days of immobilization, the muscles of the thigh which control the knee become weakened and shrink (atrophy). Knee exercises are designed to build up the tone and strength of the thigh muscles and to improve knee motion. Often times heat used for twenty to thirty minutes before working out will loosen up your tissues and help with improving the range of motion but do not use heat for the first two weeks following surgery. These exercises can be done on a training (exercise) mat, on the floor, on a table or on a bed. Use what ever works the best and is most comfortable for you Knee exercises include:  °Leg Lifts - While your knee is still   immobilized in a splint or cast, you can do straight leg raises. Lift the leg to 60 degrees, hold for 3 sec, and slowly lower the leg. Repeat 10-20 times 2-3 times daily. Perform this exercise against resistance later as your knee gets better.  Quad and Hamstring Sets - Tighten up the muscle on the front of the thigh (Quad) and hold for 5-10 sec. Repeat this 10-20 times hourly. Hamstring sets are done by pushing the foot backward against an object and holding for 5-10 sec. Repeat as with quad sets.   Leg Slides: Lying on your back, slowly slide your foot toward your buttocks, bending your knee up off the floor (only go as far as is comfortable). Then slowly slide your foot back down until your leg is flat on the floor again.  Angel Wings: Lying on your back spread your legs to the side as far apart as you can without causing discomfort.  A rehabilitation program following serious knee injuries can speed recovery and prevent re-injury in the future due to weakened muscles. Contact your doctor or a physical therapist for more information on knee rehabilitation.   IF YOU ARE TRANSFERRED TO A SKILLED REHAB  FACILITY If the patient is transferred to a skilled rehab facility following release from the hospital, a list of the current medications will be sent to the facility for the patient to continue.  When discharged from the skilled rehab facility, please have the facility set up the patient's [place] prior to being released. Also, the skilled facility will be responsible for providing the patient with their medications at time of release from the facility to include their pain medication, the muscle relaxants, and their blood thinner medication. If the patient is still at the rehab facility at time of the two week follow up appointment, the skilled rehab facility will also need to assist the patient in arranging follow up appointment in our office and any transportation needs.  MAKE SURE YOU:  Understand these instructions.  Get help right away if you are not doing well or get worse.

## 2017-02-12 NOTE — Discharge Summary (Signed)
Physician Discharge Summary  Patient ID: Gina Frazier MRN: [id number] DOB/AGE: 73-01-1946 73 y.o.  Admit date: 02/11/2017 Discharge date: 02/13/2017 Admission Diagnoses:  PAIN DUE TO TOTAL LEFT KNEE REPLACEMENT   Discharge Diagnoses: Patient Active Problem List   Diagnosis Date Noted  . 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 Chronic pain of left knee 02/08/2017  . Weight gain 03/18/2016  . Weakness   . Weight loss   . PAF (paroxysmal atrial fibrillation) ([place])   . Primary osteoarthritis of knee 05/02/2015  . Chest pain 01/28/2015  . Special screening for malignant neoplasms, colon   . Snoring 05/01/2014  . Anxiety 12/30/2010  . Preoperative cardiovascular examination 12/30/2010  . Hypertension 07/21/2010  . Chronic back pain 07/21/2010  . Vitamin D deficiency 07/21/2010  . SHORTNESS OF BREATH 05/01/2010  . Essential hypertension 04/18/2010  . OSTEOARTHROS UNSPEC GEN/LOC OTH SPEC SITES 04/18/2010  . KNEE PAIN, LEFT 04/18/2010    Past Medical History:  Diagnosis Date  . Anxiety   . Chronic back pain   . Family history of adverse reaction to anesthesia    pts daughter had severe vomiting 2014   . Hip joint pain    left  . Hypertension   . Osteoarthritis   . PAF (paroxysmal atrial fibrillation) ([place])    a. initially diagnosed 04/2014; b. on Eliquis; c. CHADS2VASc = 3  . Palpitations   . Wears contact lenses   . Wears dentures    partial upper     Transfusion: none   Consultants (if any):   Discharged Condition: Improved  Hospital Course: Patti Buntin is an 71 y.o. female who was admitted 02/11/2017 with a diagnosis of left knee instability s/p TKA and went to the operating room on 02/11/2017 and underwent the above named procedures.    Surgeries: Procedure(s): TOTAL KNEE REVISION, POLYETHELENE EXCHANGE on 02/11/2017 Patient tolerated the surgery well. Taken to PACU where she was stabilized and then transferred to the orthopedic  floor.  Started on Eliquis daily. Foot pumps applied bilaterally at 80 mm. Heels elevated on bed with rolled towels. No evidence of DVT. Negative Homan. Physical therapy started on day #1 for gait training and transfer. OT started day #1 for ADL and assisted devices.  Patient's foley was d/c on day #1. Patient's IV was d/c on day #2.  On post op day #2 patient was stable and ready for discharge to home with HHPT.  Implants: Medacta GMK size 317 mm PS with screw    She was given perioperative antibiotics:  Anti-infectives (From admission, onward)   Start     Dose/Rate Route Frequency Ordered Stop   02/11/17 1400  ceFAZolin (ANCEF) 2 g in dextrose 5 % 100 mL IVPB     2 g 200 mL/hr over 30 Minutes Intravenous Every 6 hours 02/11/17 1013 02/11/17 1957   02/11/17 1000  ceFAZolin (ANCEF) IVPB 2g/100 mL premix  Status:  Discontinued     2 g 200 mL/hr over 30 Minutes Intravenous Every 6 hours 02/11/17 0950 02/11/17 1013   02/11/17 0602  ceFAZolin (ANCEF) 2-4 GM/100ML-% IVPB    Comments:  Lyman Bishop   : cabinet override      02/11/17 0602 02/11/17 0733   02/10/17 2330  ceFAZolin (ANCEF) IVPB 2g/100 mL premix     2 g 200 mL/hr over 30 Minutes Intravenous  Once 02/10/17 2323 02/11/17 0743    .  She was given sequential compression devices, early ambulation, and Eliquis for DVT  prophylaxis.  She benefited maximally from the hospital stay and there were no complications.    Recent vital signs:  Vitals:   02/12/17 2016 02/13/17 0616  BP: (!) 177/71 (!) 142/51  Pulse: 72 69  Resp: 16   Temp: 99.6 F (37.6 C)   SpO2: 96%     Recent laboratory studies:  Lab Results  Component Value Date   HGB 11.2 (L) 02/12/2017   HGB 12.6 02/08/2017   HGB 13.7 05/16/2016   Lab Results  Component Value Date   WBC 8.9 02/12/2017   PLT 193 02/12/2017   Lab Results  Component Value Date   INR 1.06 02/08/2017   Lab Results  Component Value Date   NA 139 02/12/2017   K 4.4 02/12/2017    CL 105 02/12/2017   CO2 27 02/12/2017   BUN 17 02/12/2017   CREATININE 0.78 02/12/2017   GLUCOSE 131 (H) 02/12/2017    Discharge Medications:   Allergies as of 02/13/2017      Reactions   Fish Allergy Nausea And Vomiting, Swelling, Rash      Medication List    TAKE these medications   acetaminophen 500 MG tablet Commonly known as:  TYLENOL Take 1,000 mg every 8 (eight) hours as needed by mouth for mild pain.   apixaban 5 MG Tabs tablet Commonly known as:  ELIQUIS Take 1 tablet (5 mg total) by mouth 2 (two) times daily.   Azilsartan Medoxomil 80 MG Tabs Commonly known as:  EDARBI Take 1 tablet (80 mg total) by mouth daily.   DULoxetine 20 MG capsule Commonly known as:  CYMBALTA Take 40 mg daily by mouth.   ergocalciferol 50000 units capsule Commonly known as:  VITAMIN D2 Take 50,000 Units once a week by mouth. Saturdays   flecainide 50 MG tablet Commonly known as:  TAMBOCOR Take 1 tablet (50 mg total) by mouth 2 (two) times daily.   oxyCODONE 5 MG immediate release tablet Commonly known as:  Oxy IR/ROXICODONE Take 1-2 tablets (5-10 mg total) by mouth every 4 (four) hours as needed for moderate pain ((score 4 to 6)).   propranolol ER 80 MG 24 hr capsule Commonly known as:  INDERAL LA Take 1 capsule (80 mg total) by mouth daily.       Diagnostic Studies: No results found.  Disposition: 01-Home or Self Care    Follow-up Information    Hessie Knows, MD Follow up in 2 week(s).   Specialty:  Orthopedic Surgery Contact information: [place] [address] [phone]            Signed: Prescott Parma, Samiyyah Moffa 02/13/2017, 7:22 AM

## 2017-02-12 NOTE — Evaluation (Signed)
Occupational Therapy Evaluation Patient Details Name: Gina Frazier MRN: [id number] DOB: 1945/07/25 Today's Date: 02/12/2017    History of Present Illness Pt. is a 71 y.o. female who was admitted to [facility] for a left TKR. Pt. PMHx includes: HTN, A-Fib, and Anxiety.   Clinical Impression   Pt. Is a 71 y.o. Female who was admitted to [facility] for a left TKR. Pt. Presents with weakness, and limited mobility which limits her ability to complete ADL tasks. Pt. Resides at home with her daughter and family. Pt. reports that they just moved into an apartment which is significantly smaller than the house they moved from. Pt. Is planning to move to [place] to move into senior living apartments with her sister. Pt. Was independent with ADLs, and IADLs, medication management, meal preparation, and was working until 1 year ago at [employer].Pt. Could benefit from skilled OT services for ADL training, A/E training, and pt. education about home modification, and DME. Pt. Plans to return to her daughter's apartment temporarily until she moves to [place].     Follow Up Recommendations  No OT follow up    Equipment Recommendations       Recommendations for Other Services       Precautions / Restrictions Precautions Precautions: Fall;Knee Precaution Booklet Issued: Yes (comment) Restrictions Weight Bearing Restrictions: Yes LLE Weight Bearing: Weight bearing as tolerated             ADL either performed or assessed with clinical judgement   ADL Overall ADL's : Needs assistance/impaired Eating/Feeding: Set up   Grooming: Set up   Upper Body Bathing: Set up   Lower Body Bathing: Moderate assistance   Upper Body Dressing : Set up   Lower Body Dressing: Moderate assistance               Functional mobility during ADLs: Min guard General ADL Comments: Pt. education was provided about A/E use for LE ADLs. Pt. reports having A/E, and uses them at home.     Vision         Perception      Praxis      Pertinent Vitals/Pain Pain Assessment: 0-10 Pain Score: 2  Pain Location: Left Knee Pain Descriptors / Indicators: Sore Pain Intervention(s): Limited activity within patient's tolerance;Monitored during session;Premedicated before session;Ice applied     Hand Dominance Right   Extremity/Trunk Assessment Upper Extremity Assessment Upper Extremity Assessment: Overall WFL for tasks assessed           Communication Communication Communication: No difficulties   Cognition Arousal/Alertness: Awake/alert Behavior During Therapy: WFL for tasks assessed/performed Overall Cognitive Status: Within Functional Limits for tasks assessed                                     General Comments  L LE dressings and polar care in place.    Exercises   Shoulder Instructions      Home Living Family/patient expects to be discharged to:: Private residence Living Arrangements: Children Available Help at Discharge: Family Type of Home: Apartment       Home Layout: One level     Bathroom Shower/Tub: Teacher, early years/pre: Handicapped height     Home Equipment: Environmental consultant - 2 wheels;Cane - single point;Bedside commode   Additional Comments: Pt. just moved into an apt and sleeping on blow-up bed in living room.      Prior Functioning/Environment Level of  Independence: Independent        Comments: Pt. was Independent with ADL's/IADL's, medication management, meal preparation, and was working at [employer] up until one year ago.        OT Problem List: Decreased strength;Pain;Impaired UE functional use      OT Treatment/Interventions: Self-care/ADL training;Therapeutic exercise;Patient/family education;Therapeutic activities;DME and/or AE instruction    OT Goals(Current goals can be found in the care plan section) Acute Rehab OT Goals Patient Stated Goal: to go home OT Goal Formulation: With patient Potential to Achieve Goals: Good  OT  Frequency: Min 2X/week   Barriers to D/C:            Co-evaluation              AM-PAC PT "6 Clicks" Daily Activity     Outcome Measure Help from another person eating meals?: None Help from another person taking care of personal grooming?: None Help from another person toileting, which includes using toliet, bedpan, or urinal?: A Little Help from another person bathing (including washing, rinsing, drying)?: A Little Help from another person to put on and taking off regular upper body clothing?: None Help from another person to put on and taking off regular lower body clothing?: A Lot 6 Click Score: 20   End of Session    Activity Tolerance: Patient tolerated treatment well Patient left: in chair;with call bell/phone within reach;with chair alarm set  OT Visit Diagnosis: Muscle weakness (generalized) (M62.81)                Time: 1035-1100 OT Time Calculation (min): 25 min Charges:  OT General Charges $OT Visit: 1 Visit OT Evaluation $OT Eval Moderate Complexity: 1 Mod G-Codes: OT G-codes **NOT FOR INPATIENT CLASS** Functional Limitation: Self care Self Care Current Status (H4765): At least 20 percent but less than 40 percent impaired, limited or restricted Self Care Goal Status (Y6503): 0 percent impaired, limited or restricted   Harrel Carina, MS, OTR/L  Harrel Carina, MS, OTR/L 02/12/2017, 1:12 PM

## 2017-02-13 NOTE — Progress Notes (Signed)
Patient alert and oriented. Vitals stable. Patient has received walker and BSC. Complaining of mod pain.   Deri Fuelling, RN

## 2017-02-13 NOTE — Progress Notes (Signed)
Incident:  Patient being taken to the bathroom by Miquel Dunn, NT), patient easing down on toilet and feet slid out from under her and she plopped down on the toilet seat. NT was able to help guide to toilet seat. Patient had yellow non-skid socks on when this occurred. No complains of pain or discomfort in post surgical knee. No bruising or skin tears. RN will continue to monitor patient. Dr. Marry Guan called and notified, no new orders at this time.   Deri Fuelling, RN

## 2017-02-13 NOTE — Progress Notes (Signed)
   Subjective: 2 Days Post-Op Procedure(s) (LRB): TOTAL KNEE REVISION, POLYETHELENE EXCHANGE (Left) Patient reports pain as mild.   Patient is well, and has had no acute complaints or problems Denies any CP, SOB, ABD pain. We will continue therapy today.  Plan is to go Home after hospital stay. Plan to go home today.  Objective: Vital signs in last 24 hours: Temp:  [98.2 F (36.8 C)-99.6 F (37.6 C)] 99.6 F (37.6 C) (11/30 2016) Pulse Rate:  [68-72] 69 (12/01 0616) Resp:  [16-18] 16 (11/30 2016) BP: (130-177)/(50-71) 142/51 (12/01 0616) SpO2:  [94 %-96 %] 96 % (11/30 2016)  Intake/Output from previous day: 11/30 0701 - 12/01 0700 In: 1200 [P.O.:1200] Out: 150 [Urine:150] Intake/Output this shift: No intake/output data recorded.  Recent Labs    02/12/17 0335  HGB 11.2*   Recent Labs    02/12/17 0335  WBC 8.9  RBC 3.79*  HCT 34.6*  PLT 193   Recent Labs    02/12/17 0335  NA 139  K 4.4  CL 105  CO2 27  BUN 17  CREATININE 0.78  GLUCOSE 131*  CALCIUM 8.5*   No results for input(s): LABPT, INR in the last 72 hours.  EXAM General - Patient is Alert, Appropriate and Oriented Extremity - Neurovascular intact Sensation intact distally Intact pulses distally Dorsiflexion/Plantar flexion intact No cellulitis present Compartment soft Dressing - dressing change. Wound shows no drainage. Honeycomb dressing applied. Staples were intact. Motor Function - intact, moving foot and toes well on exam. Ambulated 150 feet with physical therapy  Past Medical History:  Diagnosis Date  . Anxiety   . Chronic back pain   . Family history of adverse reaction to anesthesia    pts daughter had severe vomiting 2014   . Hip joint pain    left  . Hypertension   . Osteoarthritis   . PAF (paroxysmal atrial fibrillation) ([place])    a. initially diagnosed 04/2014; b. on Eliquis; c. CHADS2VASc = 3  . Palpitations   . Wears contact lenses   . Wears dentures    partial upper     Assessment/Plan:   2 Days Post-Op Procedure(s) (LRB): TOTAL KNEE REVISION, POLYETHELENE EXCHANGE (Left) Active Problems:   Status post revision of total knee replacement, left  Estimated body mass index is 30.51 kg/m as calculated from the following:   Height as of this encounter: 5\' 2"  (1.575 m).   Weight as of this encounter: 75.7 kg (166 lb 12.8 oz). Advance diet Up with therapy  Labs and vital signs within normal limits. Patient doing well, pain controlled with no complaints. Care management to assist with discharge to home with home health PT today pending progress of physical therapy.   DVT Prophylaxis - Foot Pumps, TED hose and Eliquis Weight-Bearing as tolerated to left leg  Reche Dixon, PA-C [place] 02/13/2017, 7:19 AM

## 2017-02-13 NOTE — Progress Notes (Signed)
Physical Therapy Treatment Patient Details Name: Gina Frazier MRN: [id number] DOB: 1946/03/03 Today's Date: 02/13/2017    History of Present Illness Pt is a 71 y.o. female s/p L total knee revision, polyethelene exchange 02/11/17.  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    PT Comments    Participated in exercises as described below.  Pt was able to ambulate around nursing unit without seated rest.  Pt overall steady gait with generally step to pattern but progressing to some step through steps by end of session.  She declined stair training at this time stating she does not have stairs at home and will not need to do them.  Pt with increased pain but she wanted to continue with session and nursing in to give pain medication at end of session.  Pt stated she was comfortable with discharge plan and had no further questions.  Reviewed HEP and expectations of recovery.    Follow Up Recommendations  Home health PT     Equipment Recommendations  Rolling walker with 5" wheels;3in1 (PT)    Recommendations for Other Services       Precautions / Restrictions Precautions Precautions: Fall;Knee Restrictions Weight Bearing Restrictions: Yes LLE Weight Bearing: Weight bearing as tolerated    Mobility  Bed Mobility Overal bed mobility: Needs Assistance Bed Mobility: Supine to Sit;Sit to Supine     Supine to sit: Min guard Sit to supine: Min assist      Transfers Overall transfer level: Needs assistance Equipment used: Rolling walker (2 wheeled) Transfers: Sit to/from Stand Sit to Stand: Supervision         General transfer comment: mild increased effort for sit to/from stands but steady with RW; no vc's required  Ambulation/Gait Ambulation/Gait assistance: Min guard;Supervision Ambulation Distance (Feet): 200 Feet Assistive device: Rolling walker (2 wheeled) Gait Pattern/deviations: Step-to pattern;Step-through pattern Gait velocity: decreased Gait velocity  interpretation: Below normal speed for age/gender General Gait Details: initially step to but progressed to irregular step through pattern   Stairs            Wheelchair Mobility    Modified Rankin (Stroke Patients Only)       Balance Overall balance assessment: Needs assistance Sitting-balance support: No upper extremity supported;Feet supported Sitting balance-Leahy Scale: Normal     Standing balance support: Single extremity supported Standing balance-Leahy Scale: Fair Standing balance comment: requires at least single UE support on RW for static standing balance                            Cognition Arousal/Alertness: Awake/alert Behavior During Therapy: WFL for tasks assessed/performed Overall Cognitive Status: Within Functional Limits for tasks assessed                                        Exercises Total Joint Exercises Ankle Circles/Pumps: AROM;Strengthening;Both;10 reps;Supine Quad Sets: AROM;Strengthening;Both;10 reps;Supine Short Arc Quad: AAROM;Strengthening;Left;10 reps;Supine Heel Slides: AAROM;Strengthening;Left;10 reps;Supine Hip ABduction/ADduction: AAROM;Strengthening;Left;10 reps;Supine Straight Leg Raises: AAROM;Strengthening;Left;10 reps;Supine Long Arc Quad: Strengthening;AAROM;Left;AROM;Right;10 reps;Seated Knee Flexion: AAROM;Left;Seated;5 reps Goniometric ROM: 2-88    General Comments        Pertinent Vitals/Pain Pain Assessment: 0-10 Pain Score: 8  Pain Location: Left Knee Pain Descriptors / Indicators: Sore Pain Intervention(s): Limited activity within patient's tolerance;Patient requesting pain meds-RN notified;Monitored during session    Home Living  Prior Function            PT Goals (current goals can now be found in the care plan section) Progress towards PT goals: Progressing toward goals    Frequency    BID      PT Plan Current plan remains  appropriate    Co-evaluation              AM-PAC PT "6 Clicks" Daily Activity  Outcome Measure  Difficulty turning over in bed (including adjusting bedclothes, sheets and blankets)?: A Little Difficulty moving from lying on back to sitting on the side of the bed? : A Little Difficulty sitting down on and standing up from a chair with arms (e.g., wheelchair, bedside commode, etc,.)?: A Little Help needed moving to and from a bed to chair (including a wheelchair)?: A Little Help needed walking in hospital room?: A Little Help needed climbing 3-5 steps with a railing? : A Little 6 Click Score: 18    End of Session Equipment Utilized During Treatment: Gait belt Activity Tolerance: Patient tolerated treatment well Patient left: in bed;with call bell/phone within reach;with bed alarm set;with SCD's reapplied;with nursing/sitter in room Nurse Communication: Mobility status Pain - Right/Left: Left Pain - part of body: Knee     Time: 6599-3570 PT Time Calculation (min) (ACUTE ONLY): 24 min  Charges:  $Gait Training: 8-22 mins $Therapeutic Exercise: 8-22 mins                    G Codes:       Chesley Noon, PTA 02/13/17, 9:41 AM

## 2017-02-13 NOTE — Care Management Note (Addendum)
Case Management Note  Patient Details  Name: Gina Frazier MRN: [id number] Date of Birth: Feb 21, 1946  Subjective/Objective:    Home Health PT, RN arranged with [facility]. RW and BSC provided by [facility]. Has Eliquis coupon.                 Action/Plan:   Expected Discharge Date:  02/13/17               Expected Discharge Plan:  [place]  In-House Referral:     Discharge planning Services  CM Consult  Post Acute Care Choice:  Durable Medical Equipment, Home Health Choice offered to:  Patient  DME Arranged:  Bedside commode, Walker rolling DME Agency:  [place]:  PT [place]:  [facility] ([facility])  Status of Service:  Completed, signed off  If discussed at [employer], dates discussed:    Additional Comments:  Leeman Johnsey A, RN 02/13/2017, 8:00 AM

## 2017-02-13 NOTE — Progress Notes (Signed)
Gina Frazier to be D/C'd Home per MD order.  Discussed with the patient and all questions fully answered.  VSS, Skin clean, dry and intact without evidence of skin break down, no evidence of skin tears noted. IV catheter discontinued intact. Site without signs and symptoms of complications. Dressing and pressure applied.  An After Visit Summary was printed and given to the patient. Patient received prescription.  D/c education completed with patient/family including follow up instructions, medication list, d/c activities limitations if indicated, with other d/c instructions as indicated by MD - patient able to verbalize understanding, all questions fully answered.   Patient instructed to return to ED, call 911, or call MD for any changes in condition.   Patient escorted via [place], and D/C home via private auto.  Gina Frazier 02/13/2017 4:18 PM

## 2017-02-15 DIAGNOSIS — F419 Anxiety disorder, unspecified: Secondary | ICD-10-CM | POA: Diagnosis not present

## 2017-02-15 DIAGNOSIS — M1991 Primary osteoarthritis, unspecified site: Secondary | ICD-10-CM | POA: Diagnosis not present

## 2017-02-15 DIAGNOSIS — T84023D Instability of internal left knee prosthesis, subsequent encounter: Secondary | ICD-10-CM | POA: Diagnosis not present

## 2017-02-15 DIAGNOSIS — I480 Paroxysmal atrial fibrillation: Secondary | ICD-10-CM | POA: Diagnosis not present

## 2017-02-15 DIAGNOSIS — M545 Low back pain: Secondary | ICD-10-CM | POA: Diagnosis not present

## 2017-02-15 DIAGNOSIS — I10 Essential (primary) hypertension: Secondary | ICD-10-CM | POA: Diagnosis not present

## 2017-02-17 ENCOUNTER — Ambulatory Visit: Payer: Medicare Other | Admitting: Internal Medicine

## 2017-02-17 DIAGNOSIS — M1991 Primary osteoarthritis, unspecified site: Secondary | ICD-10-CM | POA: Diagnosis not present

## 2017-02-17 DIAGNOSIS — I480 Paroxysmal atrial fibrillation: Secondary | ICD-10-CM | POA: Diagnosis not present

## 2017-02-17 DIAGNOSIS — I10 Essential (primary) hypertension: Secondary | ICD-10-CM | POA: Diagnosis not present

## 2017-02-17 DIAGNOSIS — M545 Low back pain: Secondary | ICD-10-CM | POA: Diagnosis not present

## 2017-02-17 DIAGNOSIS — T84023D Instability of internal left knee prosthesis, subsequent encounter: Secondary | ICD-10-CM | POA: Diagnosis not present

## 2017-02-17 DIAGNOSIS — F419 Anxiety disorder, unspecified: Secondary | ICD-10-CM | POA: Diagnosis not present

## 2017-02-18 DIAGNOSIS — F419 Anxiety disorder, unspecified: Secondary | ICD-10-CM | POA: Diagnosis not present

## 2017-02-18 DIAGNOSIS — I10 Essential (primary) hypertension: Secondary | ICD-10-CM | POA: Diagnosis not present

## 2017-02-18 DIAGNOSIS — I480 Paroxysmal atrial fibrillation: Secondary | ICD-10-CM | POA: Diagnosis not present

## 2017-02-18 DIAGNOSIS — T84023D Instability of internal left knee prosthesis, subsequent encounter: Secondary | ICD-10-CM | POA: Diagnosis not present

## 2017-02-18 DIAGNOSIS — M1991 Primary osteoarthritis, unspecified site: Secondary | ICD-10-CM | POA: Diagnosis not present

## 2017-02-18 DIAGNOSIS — M545 Low back pain: Secondary | ICD-10-CM | POA: Diagnosis not present

## 2017-02-19 DIAGNOSIS — F419 Anxiety disorder, unspecified: Secondary | ICD-10-CM | POA: Diagnosis not present

## 2017-02-19 DIAGNOSIS — I480 Paroxysmal atrial fibrillation: Secondary | ICD-10-CM | POA: Diagnosis not present

## 2017-02-19 DIAGNOSIS — M545 Low back pain: Secondary | ICD-10-CM | POA: Diagnosis not present

## 2017-02-19 DIAGNOSIS — T84023D Instability of internal left knee prosthesis, subsequent encounter: Secondary | ICD-10-CM | POA: Diagnosis not present

## 2017-02-19 DIAGNOSIS — M1991 Primary osteoarthritis, unspecified site: Secondary | ICD-10-CM | POA: Diagnosis not present

## 2017-02-19 DIAGNOSIS — I10 Essential (primary) hypertension: Secondary | ICD-10-CM | POA: Diagnosis not present

## 2017-02-23 DIAGNOSIS — M1991 Primary osteoarthritis, unspecified site: Secondary | ICD-10-CM | POA: Diagnosis not present

## 2017-02-23 DIAGNOSIS — F419 Anxiety disorder, unspecified: Secondary | ICD-10-CM | POA: Diagnosis not present

## 2017-02-23 DIAGNOSIS — I480 Paroxysmal atrial fibrillation: Secondary | ICD-10-CM | POA: Diagnosis not present

## 2017-02-23 DIAGNOSIS — T84023D Instability of internal left knee prosthesis, subsequent encounter: Secondary | ICD-10-CM | POA: Diagnosis not present

## 2017-02-23 DIAGNOSIS — I10 Essential (primary) hypertension: Secondary | ICD-10-CM | POA: Diagnosis not present

## 2017-02-23 DIAGNOSIS — M545 Low back pain: Secondary | ICD-10-CM | POA: Diagnosis not present

## 2017-02-25 DIAGNOSIS — M1991 Primary osteoarthritis, unspecified site: Secondary | ICD-10-CM | POA: Diagnosis not present

## 2017-02-25 DIAGNOSIS — I480 Paroxysmal atrial fibrillation: Secondary | ICD-10-CM | POA: Diagnosis not present

## 2017-02-25 DIAGNOSIS — M545 Low back pain: Secondary | ICD-10-CM | POA: Diagnosis not present

## 2017-02-25 DIAGNOSIS — I10 Essential (primary) hypertension: Secondary | ICD-10-CM | POA: Diagnosis not present

## 2017-02-25 DIAGNOSIS — T84023D Instability of internal left knee prosthesis, subsequent encounter: Secondary | ICD-10-CM | POA: Diagnosis not present

## 2017-02-25 DIAGNOSIS — F419 Anxiety disorder, unspecified: Secondary | ICD-10-CM | POA: Diagnosis not present

## 2017-02-26 DIAGNOSIS — M25662 Stiffness of left knee, not elsewhere classified: Secondary | ICD-10-CM | POA: Diagnosis not present

## 2017-02-26 DIAGNOSIS — M25562 Pain in left knee: Secondary | ICD-10-CM | POA: Diagnosis not present

## 2017-02-26 DIAGNOSIS — Z96652 Presence of left artificial knee joint: Secondary | ICD-10-CM | POA: Diagnosis not present

## 2017-02-26 DIAGNOSIS — R29898 Other symptoms and signs involving the musculoskeletal system: Secondary | ICD-10-CM | POA: Diagnosis not present

## 2017-03-02 DIAGNOSIS — Z96652 Presence of left artificial knee joint: Secondary | ICD-10-CM | POA: Diagnosis not present

## 2017-03-02 DIAGNOSIS — M25562 Pain in left knee: Secondary | ICD-10-CM | POA: Diagnosis not present

## 2017-03-05 DIAGNOSIS — M25562 Pain in left knee: Secondary | ICD-10-CM | POA: Diagnosis not present

## 2017-03-05 DIAGNOSIS — Z96652 Presence of left artificial knee joint: Secondary | ICD-10-CM | POA: Diagnosis not present

## 2017-03-10 DIAGNOSIS — Z96652 Presence of left artificial knee joint: Secondary | ICD-10-CM | POA: Diagnosis not present

## 2017-03-10 DIAGNOSIS — M25562 Pain in left knee: Secondary | ICD-10-CM | POA: Diagnosis not present

## 2017-03-17 DIAGNOSIS — M25562 Pain in left knee: Secondary | ICD-10-CM | POA: Diagnosis not present

## 2017-03-17 DIAGNOSIS — Z96652 Presence of left artificial knee joint: Secondary | ICD-10-CM | POA: Diagnosis not present

## 2017-03-19 DIAGNOSIS — Z96652 Presence of left artificial knee joint: Secondary | ICD-10-CM | POA: Diagnosis not present

## 2017-03-19 DIAGNOSIS — M25662 Stiffness of left knee, not elsewhere classified: Secondary | ICD-10-CM | POA: Diagnosis not present

## 2017-03-19 DIAGNOSIS — M25562 Pain in left knee: Secondary | ICD-10-CM | POA: Diagnosis not present

## 2017-03-19 DIAGNOSIS — R29898 Other symptoms and signs involving the musculoskeletal system: Secondary | ICD-10-CM | POA: Diagnosis not present

## 2017-03-22 ENCOUNTER — Other Ambulatory Visit: Payer: Self-pay | Admitting: Cardiovascular Disease

## 2017-03-22 DIAGNOSIS — M25562 Pain in left knee: Secondary | ICD-10-CM | POA: Diagnosis not present

## 2017-03-22 DIAGNOSIS — Z96652 Presence of left artificial knee joint: Secondary | ICD-10-CM | POA: Diagnosis not present

## 2017-03-22 NOTE — Telephone Encounter (Signed)
Please review for refill. Thanks!  

## 2017-03-24 DIAGNOSIS — G608 Other hereditary and idiopathic neuropathies: Secondary | ICD-10-CM | POA: Diagnosis not present

## 2017-03-24 DIAGNOSIS — G250 Essential tremor: Secondary | ICD-10-CM | POA: Diagnosis not present

## 2017-03-24 DIAGNOSIS — R413 Other amnesia: Secondary | ICD-10-CM | POA: Diagnosis not present

## 2017-03-25 DIAGNOSIS — Z96652 Presence of left artificial knee joint: Secondary | ICD-10-CM | POA: Diagnosis not present

## 2017-03-26 DIAGNOSIS — Z96659 Presence of unspecified artificial knee joint: Secondary | ICD-10-CM | POA: Diagnosis not present

## 2017-03-29 ENCOUNTER — Ambulatory Visit (INDEPENDENT_AMBULATORY_CARE_PROVIDER_SITE_OTHER): Payer: Medicare Other | Admitting: Internal Medicine

## 2017-03-29 ENCOUNTER — Encounter: Payer: Self-pay | Admitting: Internal Medicine

## 2017-03-29 VITALS — BP 140/70 | HR 58 | Temp 98.1°F | Ht 61.0 in | Wt 164.4 lb

## 2017-03-29 DIAGNOSIS — F419 Anxiety disorder, unspecified: Secondary | ICD-10-CM

## 2017-03-29 DIAGNOSIS — R739 Hyperglycemia, unspecified: Secondary | ICD-10-CM | POA: Diagnosis not present

## 2017-03-29 DIAGNOSIS — Z1159 Encounter for screening for other viral diseases: Secondary | ICD-10-CM | POA: Diagnosis not present

## 2017-03-29 DIAGNOSIS — E2839 Other primary ovarian failure: Secondary | ICD-10-CM

## 2017-03-29 DIAGNOSIS — Z13220 Encounter for screening for lipoid disorders: Secondary | ICD-10-CM

## 2017-03-29 DIAGNOSIS — Z13818 Encounter for screening for other digestive system disorders: Secondary | ICD-10-CM

## 2017-03-29 DIAGNOSIS — Z96652 Presence of left artificial knee joint: Secondary | ICD-10-CM

## 2017-03-29 DIAGNOSIS — R29890 Loss of height: Secondary | ICD-10-CM

## 2017-03-29 DIAGNOSIS — R490 Dysphonia: Secondary | ICD-10-CM

## 2017-03-29 DIAGNOSIS — I480 Paroxysmal atrial fibrillation: Secondary | ICD-10-CM

## 2017-03-29 DIAGNOSIS — R1011 Right upper quadrant pain: Secondary | ICD-10-CM | POA: Diagnosis not present

## 2017-03-29 DIAGNOSIS — G250 Essential tremor: Secondary | ICD-10-CM

## 2017-03-29 DIAGNOSIS — Z13820 Encounter for screening for osteoporosis: Secondary | ICD-10-CM | POA: Diagnosis not present

## 2017-03-29 DIAGNOSIS — M25559 Pain in unspecified hip: Secondary | ICD-10-CM

## 2017-03-29 DIAGNOSIS — I10 Essential (primary) hypertension: Secondary | ICD-10-CM

## 2017-03-29 DIAGNOSIS — Z1231 Encounter for screening mammogram for malignant neoplasm of breast: Secondary | ICD-10-CM

## 2017-03-29 DIAGNOSIS — E559 Vitamin D deficiency, unspecified: Secondary | ICD-10-CM

## 2017-03-29 LAB — VITAMIN D 25 HYDROXY (VIT D DEFICIENCY, FRACTURES): VITD: 46.41 ng/mL (ref 30.00–100.00)

## 2017-03-29 LAB — COMPREHENSIVE METABOLIC PANEL
ALK PHOS: 99 U/L (ref 39–117)
ALT: 5 U/L (ref 0–35)
AST: 12 U/L (ref 0–37)
Albumin: 3.9 g/dL (ref 3.5–5.2)
BUN: 12 mg/dL (ref 6–23)
CHLORIDE: 102 meq/L (ref 96–112)
CO2: 34 mEq/L — ABNORMAL HIGH (ref 19–32)
Calcium: 9.2 mg/dL (ref 8.4–10.5)
Creatinine, Ser: 0.71 mg/dL (ref 0.40–1.20)
GFR: 85.61 mL/min (ref >60.00)
GLUCOSE: 97 mg/dL (ref 70–99)
POTASSIUM: 4.4 meq/L (ref 3.5–5.1)
Sodium: 140 mEq/L (ref 135–145)
TOTAL PROTEIN: 7 g/dL (ref 6.0–8.3)
Total Bilirubin: 0.8 mg/dL (ref 0.2–1.2)

## 2017-03-29 LAB — CBC WITH DIFFERENTIAL/PLATELET
BASOS PCT: 0.7 % (ref 0.0–3.0)
Basophils Absolute: 0 10*3/uL (ref 0.0–0.1)
EOS PCT: 3.6 % (ref 0.0–5.0)
Eosinophils Absolute: 0.2 10*3/uL (ref 0.0–0.7)
HCT: 37.2 % (ref 36.0–46.0)
HEMOGLOBIN: 11.9 g/dL — AB (ref 12.0–15.0)
LYMPHS ABS: 1.2 10*3/uL (ref 0.7–4.0)
Lymphocytes Relative: 24.4 % (ref 12.0–46.0)
MCHC: 31.8 g/dL (ref 30.0–36.0)
MCV: 92.8 fl (ref 78.0–100.0)
MONOS PCT: 6.9 % (ref 3.0–12.0)
Monocytes Absolute: 0.3 10*3/uL (ref 0.1–1.0)
Neutro Abs: 3.1 10*3/uL (ref 1.4–7.7)
Neutrophils Relative %: 64.4 % (ref 43.0–77.0)
Platelets: 224 10*3/uL (ref 150.0–400.0)
RBC: 4.01 Mil/uL (ref 3.87–5.11)
RDW: 14.2 % (ref 11.5–15.5)
WBC: 4.8 10*3/uL (ref 4.0–10.5)

## 2017-03-29 LAB — LIPID PANEL
Cholesterol: 193 mg/dL (ref 0–200)
HDL: 69 mg/dL (ref >39.00)
LDL CALC: 110 mg/dL — AB (ref 0–99)
NONHDL: 123.51
Total CHOL/HDL Ratio: 3
Triglycerides: 68 mg/dL (ref 0.0–149.0)
VLDL: 13.6 mg/dL (ref 0.0–40.0)

## 2017-03-29 LAB — TSH: TSH: 3.78 u[IU]/mL (ref 0.35–4.50)

## 2017-03-29 LAB — T4, FREE: Free T4: 0.92 ng/dL (ref 0.60–1.60)

## 2017-03-29 LAB — HEMOGLOBIN A1C: HEMOGLOBIN A1C: 5.5 % (ref 4.6–6.5)

## 2017-03-29 MED ORDER — CHOLECALCIFEROL 1.25 MG (50000 UT) PO CAPS: 50000.0000 [IU] | ORAL_CAPSULE | ORAL | 1 refills | Status: DC

## 2017-03-29 NOTE — Patient Instructions (Addendum)
Please follow up in 3 months sooner if needed  Take care  I ordered mammogram and bone density  You had Zostavax think about Shingrix if you have not had I will call [facility] to see   Essential Tremor A tremor is trembling or shaking that you cannot control. Most tremors affect the hands or arms. Tremors can also affect the head, vocal cords, face, and other parts of the body. Essential tremor is a tremor without a known cause. What are the causes? Essential tremor has no known cause. What increases the risk? You may be at greater risk of essential tremor if:  You have a family member with essential tremor.  You are age 39 or older.  You take certain medicines.  What are the signs or symptoms? The main sign of a tremor is uncontrolled and unintentional rhythmic shaking of a body part.  You may have difficulty eating with a spoon or fork.  You may have difficulty writing.  You may nod your head up and down or side to side.  You may have a quivering voice.  Your tremors:  May get worse over time.  May come and go.  May be more noticeable on one side of your body.  May get worse due to stress, fatigue, caffeine, and extreme heat or cold.  How is this diagnosed? Your health care provider can diagnose essential tremor based on your symptoms, medical history, and a physical examination. There is no single test to diagnose an essential tremor. However, your health care provider may perform a variety of tests to rule out other conditions. Tests may include:  Blood and urine tests.  Imaging studies of your brain, such as: ? CT scan. ? MRI.  A test that measures involuntary muscle movement (electromyogram).  How is this treated? Your tremors may go away without treatment. Mild tremors may not need treatment if they do not affect your day-to-day life. Severe tremors may need to be treated using one or a combination of the following options:  Medicines. This may include  medicine that is injected.  Lifestyle changes.  Physical therapy.  Follow these instructions at home:  Take medicines only as directed by your health care provider.  Limit alcohol intake to no more than 1 drink per day for nonpregnant women and 2 drinks per day for men. One drink equals 12 oz of beer, 5 oz of wine, or 1 oz of hard liquor.  Do not use any tobacco products, including cigarettes, chewing tobacco, or electronic cigarettes. If you need help quitting, ask your health care provider.  Take medicines only as directed by your health care provider.  Avoid extreme heat or cold.  Limit the amount of caffeine you consumeas directed by your health care provider.  Try to get eight hours of sleep each night.  Find ways to manage your stress, such as meditation or yoga.  Keep all follow-up visits as directed by your health care provider. This is important. This includes any physical therapy visits. Contact a health care provider if:  You experience any changes in the location or intensity of your tremors.  You start having a tremor after starting a new medicine.  You have tremor with other symptoms such as: ? Numbness. ? Tingling. ? Pain. ? Weakness.  Your tremor gets worse.  Your tremor interferes with your daily life. This information is not intended to replace advice given to you by your health care provider. Make sure you discuss any questions you  have with your health care provider. Document Released: 03/23/2014 Document Revised: 08/08/2015 Document Reviewed: 08/28/2013 Elsevier Interactive Patient Education  [employer].

## 2017-03-30 LAB — HEPATITIS B SURFACE ANTIBODY, QUANTITATIVE

## 2017-03-30 LAB — HEPATITIS C ANTIBODY
Hepatitis C Ab: NONREACTIVE
SIGNAL TO CUT-OFF: 0.02 (ref <1.00)

## 2017-03-31 ENCOUNTER — Encounter: Payer: Self-pay | Admitting: Internal Medicine

## 2017-03-31 DIAGNOSIS — M25559 Pain in unspecified hip: Secondary | ICD-10-CM | POA: Insufficient documentation

## 2017-03-31 DIAGNOSIS — R739 Hyperglycemia, unspecified: Secondary | ICD-10-CM | POA: Insufficient documentation

## 2017-03-31 DIAGNOSIS — G250 Essential tremor: Secondary | ICD-10-CM | POA: Insufficient documentation

## 2017-03-31 DIAGNOSIS — R490 Dysphonia: Secondary | ICD-10-CM | POA: Insufficient documentation

## 2017-03-31 NOTE — Progress Notes (Addendum)
Chief Complaint  Patient presents with  . Establish Care   Establish with daughter  1. AF controlled follows with Dr. Rockey Situ stable on meds  2. S/p left knee surgery x 2 last 01/2017 Dr. Rudene Christians  3. H/o essential tremor follows with Dr. Manuella Ghazi does not want medications due to they can make sleepy  4. Pt c/o hoarseness x  93monthw/o history of cold or cough  5. Pt c/o intermittent abdominal pain    Review of Systems  Constitutional: Negative for weight loss.  HENT:       +hoarseness x 156montho cold/cough  Eyes:       No vision problems  Respiratory: Negative for shortness of breath.   Cardiovascular: Negative for chest pain.       +chronic leg edema   Gastrointestinal: Negative for abdominal pain.  Genitourinary: Negative for dysuria.  Musculoskeletal: Negative for falls.  Skin: Negative for rash.  Neurological: Positive for tremors.  Psychiatric/Behavioral: Negative for memory loss.   Past Medical History:  Diagnosis Date  . Allergy   . Anxiety   . Chronic back pain   . Family history of adverse reaction to anesthesia    pts daughter had severe vomiting 2014   . Hip joint pain    left  . Hypertension   . Osteoarthritis   . [place]   a. initially diagnosed 04/2014; b. on Eliquis; c. CHADS2VASc = 3  . Palpitations   . Wears contact lenses   . Wears dentures    partial upper   Past Surgical History:  Procedure Laterality Date  . CHOLECYSTECTOMY    . COLONOSCOPY WITH PROPOFOL N/A 11/05/2014   Procedure: COLONOSCOPY WITH PROPOFOL;  Surgeon: DaLucilla LameMD;  [facility];  Laterality: N/A;  . JOINT REPLACEMENT     left knee x 2 04/2024 and 01/2017 Dr. MeRudene Christians . KNEE ARTHROSCOPY Left 06/28/2014   Procedure: ARTHROSCOPY KNEE WITH PARTIALLATERAL MENISECTOMY AND DEBRIDEMENT;  Surgeon: JeSusa DayMD;  Location: [facility];  Service: Orthopedics;  Laterality: Left;  . TOOTH EXTRACTION    . TOTAL KNEE  ARTHROPLASTY Left 05/02/2015   Procedure: TOTAL KNEE ARTHROPLASTY;  Surgeon: MiHessie KnowsMD;  Location: [facility];  Service: Orthopedics;  Laterality: Left;  . TOTAL KNEE REVISION Left 02/11/2017   Procedure: TOTAL KNEE REVISION, POLYETHELENE EXCHANGE;  Surgeon: MeHessie KnowsMD;  Location: [facility];  Service: Orthopedics;  Laterality: Left;   Family History  Problem Relation Age of Onset  . Heart disease Mother   . Hypertension Mother   . Mental illness Mother   . Diabetes Mother   . Heart disease Father   . Hypertension Sister   . Depression Sister   . Diabetes Sister   . Heart disease Brother   . Hypertension Brother   . Arthritis Daughter   . GER disease Daughter   . Hypothyroidism Daughter   . Lupus Daughter   . Cancer Other        colon cancer, breast cancer   . Heart disease Other    Social History   Socioeconomic History  . Marital status: Single    Spouse name: Not on file  . Number of children: Not on file  . Years of education: Not on file  . Highest education level: Not on file  Social Needs  . Financial resource strain: Not on file  . Food insecurity - worry: Not on file  . Food  insecurity - inability: Not on file  . Transportation needs - medical: Not on file  . Transportation needs - non-medical: Not on file  Occupational History  . Not on file  Tobacco Use  . Smoking status: Never Smoker  . Smokeless tobacco: Never Used  Substance and Sexual Activity  . Alcohol use: No  . Drug use: No  . Sexual activity: No  Other Topics Concern  . Not on file  Social History Narrative   2 daughters 1 lives [place] the other [place] pt visits both    Current Meds  Medication Sig  . acetaminophen (TYLENOL) 500 MG tablet Take 1,000 mg every 8 (eight) hours as needed by mouth for mild pain.  . DULoxetine (CYMBALTA) 20 MG capsule Take 40 mg daily by mouth.  Arne Cleveland 5 MG TABS tablet TAKE 1 TABLET BY MOUTH 2 TIMES DAILY  . flecainide (TAMBOCOR) 50 MG tablet TAKE 1  TABLET BY MOUTH 2 TIMES DAILY  . propranolol ER (INDERAL LA) 80 MG 24 hr capsule TAKE 1 CAPSULE BY MOUTH DAILY  . [DISCONTINUED] ergocalciferol (VITAMIN D2) 50000 units capsule Take 50,000 Units once a week by mouth. Saturdays   Allergies  Allergen Reactions  . Fish Allergy Nausea And Vomiting, Swelling and Rash   Recent Results (from the past 2160 hour(s))  CBC     Status: None   Collection Time: 02/08/17  2:21 PM  Result Value Ref Range   WBC 5.1 3.6 - 11.0 K/uL   RBC 4.21 3.80 - 5.20 MIL/uL   Hemoglobin 12.6 12.0 - 16.0 g/dL   HCT 38.0 35.0 - 47.0 %   MCV 90.2 80.0 - 100.0 fL   MCH 30.0 26.0 - 34.0 pg   MCHC 33.3 32.0 - 36.0 g/dL   RDW 13.4 11.5 - 14.5 %   Platelets 216 150 - 440 K/uL  Basic metabolic panel     Status: Abnormal   Collection Time: 02/08/17  2:21 PM  Result Value Ref Range   Sodium 137 135 - 145 mmol/L   Potassium 3.7 3.5 - 5.1 mmol/L   Chloride 99 (L) 101 - 111 mmol/L   CO2 29 22 - 32 mmol/L   Glucose, Bld 79 65 - 99 mg/dL   BUN 19 6 - 20 mg/dL   Creatinine, Ser 0.73 0.44 - 1.00 mg/dL   Calcium 9.1 8.9 - 10.3 mg/dL   GFR calc non Af Amer >60 >60 mL/min   GFR calc Af Amer >60 >60 mL/min    Comment: (NOTE) The eGFR has been calculated using the CKD EPI equation. This calculation has not been validated in all clinical situations. eGFR's persistently <60 mL/min signify possible Chronic Kidney Disease.    Anion gap 9 5 - 15  Type and screen [place]     Status: None   Collection Time: 02/08/17  2:21 PM  Result Value Ref Range   ABO/RH(D) A POS    Antibody Screen NEG    Sample Expiration 02/22/2017    Extend sample reason NO TRANSFUSIONS OR PREGNANCY IN THE PAST 3 MONTHS   Protime-INR     Status: None   Collection Time: 02/08/17  2:21 PM  Result Value Ref Range   Prothrombin Time 13.7 11.4 - 15.2 seconds   INR 1.06   APTT     Status: None   Collection Time: 02/08/17  2:21 PM  Result Value Ref Range   aPTT 31 24 - 36 seconds   Sedimentation rate  Status: None   Collection Time: 02/08/17  2:21 PM  Result Value Ref Range   Sed Rate 20 0 - 30 mm/hr  Urinalysis, Complete w Microscopic     Status: Abnormal   Collection Time: 02/08/17  2:21 PM  Result Value Ref Range   Color, Urine STRAW (A) YELLOW   APPearance CLEAR (A) CLEAR   Specific Gravity, Urine 1.011 1.005 - 1.030   pH 5.0 5.0 - 8.0   Glucose, UA NEGATIVE NEGATIVE mg/dL   Hgb urine dipstick SMALL (A) NEGATIVE   Bilirubin Urine NEGATIVE NEGATIVE   Ketones, ur NEGATIVE NEGATIVE mg/dL   Protein, ur NEGATIVE NEGATIVE mg/dL   Nitrite NEGATIVE NEGATIVE   Leukocytes, UA NEGATIVE NEGATIVE   RBC / HPF 0-5 0 - 5 RBC/hpf   WBC, UA 0-5 0 - 5 WBC/hpf   Bacteria, UA NONE SEEN NONE SEEN   Squamous Epithelial / LPF 0-5 (A) NONE SEEN   Mucus PRESENT   Urine culture     Status: None   Collection Time: 02/08/17  2:21 PM  Result Value Ref Range   Specimen Description URINE, RANDOM    Special Requests NONE    Culture      NO GROWTH Performed at [facility], [address], [place], [place] [address]    Report Status 02/09/2017 FINAL   Surgical pcr screen     Status: None   Collection Time: 02/08/17  2:21 PM  Result Value Ref Range   MRSA, PCR NEGATIVE NEGATIVE   Staphylococcus aureus NEGATIVE NEGATIVE    Comment: (NOTE) The Xpert SA Assay (FDA approved for NASAL specimens in patients 77 years of age and older), is one component of a comprehensive surveillance program. It is not intended to diagnose infection nor to guide or monitor treatment.   CBC     Status: Abnormal   Collection Time: 02/12/17  3:35 AM  Result Value Ref Range   WBC 8.9 3.6 - 11.0 K/uL   RBC 3.79 (L) 3.80 - 5.20 MIL/uL   Hemoglobin 11.2 (L) 12.0 - 16.0 g/dL   HCT 34.6 (L) 35.0 - 47.0 %   MCV 91.3 80.0 - 100.0 fL   MCH 29.6 26.0 - 34.0 pg   MCHC 32.4 32.0 - 36.0 g/dL   RDW 13.3 11.5 - 14.5 %   Platelets 193 150 - 440 K/uL  Basic metabolic panel     Status: Abnormal    Collection Time: 02/12/17  3:35 AM  Result Value Ref Range   Sodium 139 135 - 145 mmol/L   Potassium 4.4 3.5 - 5.1 mmol/L   Chloride 105 101 - 111 mmol/L   CO2 27 22 - 32 mmol/L   Glucose, Bld 131 (H) 65 - 99 mg/dL   BUN 17 6 - 20 mg/dL   Creatinine, Ser 0.78 0.44 - 1.00 mg/dL   Calcium 8.5 (L) 8.9 - 10.3 mg/dL   GFR calc non Af Amer >60 >60 mL/min   GFR calc Af Amer >60 >60 mL/min    Comment: (NOTE) The eGFR has been calculated using the CKD EPI equation. This calculation has not been validated in all clinical situations. eGFR's persistently <60 mL/min signify possible Chronic Kidney Disease.    Anion gap 7 5 - 15  Comprehensive metabolic panel     Status: Abnormal   Collection Time: 03/29/17  9:52 AM  Result Value Ref Range   Sodium 140 135 - 145 mEq/L   Potassium 4.4 3.5 - 5.1 mEq/L  Chloride 102 96 - 112 mEq/L   CO2 34 (H) 19 - 32 mEq/L   Glucose, Bld 97 70 - 99 mg/dL   BUN 12 6 - 23 mg/dL   Creatinine, Ser 0.71 0.40 - 1.20 mg/dL   Total Bilirubin 0.8 0.2 - 1.2 mg/dL   Alkaline Phosphatase 99 39 - 117 U/L   AST 12 0 - 37 U/L   ALT 5 0 - 35 U/L   Total Protein 7.0 6.0 - 8.3 g/dL   Albumin 3.9 3.5 - 5.2 g/dL   Calcium 9.2 8.4 - 10.5 mg/dL   GFR 85.61 >60.00 mL/min  CBC w/Diff     Status: Abnormal   Collection Time: 03/29/17  9:52 AM  Result Value Ref Range   WBC 4.8 4.0 - 10.5 K/uL   RBC 4.01 3.87 - 5.11 Mil/uL   Hemoglobin 11.9 (L) 12.0 - 15.0 g/dL   HCT 37.2 36.0 - 46.0 %   MCV 92.8 78.0 - 100.0 fl   MCHC 31.8 30.0 - 36.0 g/dL   RDW 14.2 11.5 - 15.5 %   Platelets 224.0 150.0 - 400.0 K/uL   Neutrophils Relative % 64.4 43.0 - 77.0 %   Lymphocytes Relative 24.4 12.0 - 46.0 %   Monocytes Relative 6.9 3.0 - 12.0 %   Eosinophils Relative 3.6 0.0 - 5.0 %   Basophils Relative 0.7 0.0 - 3.0 %   Neutro Abs 3.1 1.4 - 7.7 K/uL   Lymphs Abs 1.2 0.7 - 4.0 K/uL   Monocytes Absolute 0.3 0.1 - 1.0 K/uL   Eosinophils Absolute 0.2 0.0 - 0.7 K/uL   Basophils Absolute 0.0 0.0  - 0.1 K/uL  Lipid panel     Status: Abnormal   Collection Time: 03/29/17  9:52 AM  Result Value Ref Range   Cholesterol 193 0 - 200 mg/dL    Comment: ATP III Classification       Desirable:  < 200 mg/dL               Borderline High:  200 - 239 mg/dL          High:  > = 240 mg/dL   Triglycerides 68.0 0.0 - 149.0 mg/dL    Comment: Normal:  <150 mg/dLBorderline High:  150 - 199 mg/dL   HDL 69.00 >39.00 mg/dL   VLDL 13.6 0.0 - 40.0 mg/dL   LDL Cholesterol 110 (H) 0 - 99 mg/dL   Total CHOL/HDL Ratio 3     Comment:                Men          Women1/2 Average Risk     3.4          3.3Average Risk          5.0          4.42X Average Risk          9.6          7.13X Average Risk          15.0          11.0                       NonHDL 123.51     Comment: NOTE:  Non-HDL goal should be 30 mg/dL higher than patient's LDL goal (i.e. LDL goal of < 70 mg/dL, would have non-HDL goal of < 100 mg/dL)  Hemoglobin A1c     Status: None  Collection Time: 03/29/17  9:52 AM  Result Value Ref Range   Hgb A1c MFr Bld 5.5 4.6 - 6.5 %    Comment: Glycemic Control Guidelines for People with Diabetes:Non Diabetic:  <6%Goal of Therapy: <7%Additional Action Suggested:  >8%   T4, free     Status: None   Collection Time: 03/29/17  9:52 AM  Result Value Ref Range   Free T4 0.92 0.60 - 1.60 ng/dL    Comment: Specimens from patients who are undergoing biotin therapy and /or ingesting biotin supplements may contain high levels of biotin.  The higher biotin concentration in these specimens interferes with this Free T4 assay.  Specimens that contain high levels  of biotin may cause false high results for this Free T4 assay.  Please interpret results in light of the total clinical presentation of the patient.    TSH     Status: None   Collection Time: 03/29/17  9:52 AM  Result Value Ref Range   TSH 3.78 0.35 - 4.50 uIU/mL  Vitamin D (25 hydroxy)     Status: None   Collection Time: 03/29/17  9:52 AM  Result Value Ref  Range   VITD 46.41 30.00 - 100.00 ng/mL  Hepatitis B surface antibody     Status: Abnormal   Collection Time: 03/29/17  9:52 AM  Result Value Ref Range   Hepatitis B-Post <5 (L) > OR = 10 mIU/mL    Comment: . Patient does not have immunity to hepatitis B virus. . For additional information, please refer to http://education.[facility].com/faq/FAQ105 (This link is being provided for informational/ educational purposes only).   Hepatitis C antibody     Status: None   Collection Time: 03/29/17  9:52 AM  Result Value Ref Range   Hepatitis C Ab NON-REACTIVE NON-REACTI   SIGNAL TO CUT-OFF 0.02 <1.00   Objective  Body mass index is 31.06 kg/m. Wt Readings from Last 3 Encounters:  03/29/17 164 lb 6 oz (74.6 kg)  02/11/17 166 lb 12.8 oz (75.7 kg)  02/08/17 167 lb (75.8 kg)   Temp Readings from Last 3 Encounters:  03/29/17 98.1 F (36.7 C) (Oral)  02/13/17 98.5 F (36.9 C) (Oral)  05/16/16 98.3 F (36.8 C) (Oral)   BP Readings from Last 3 Encounters:  03/29/17 140/70  02/13/17 (!) 160/53  02/08/17 133/62   Pulse Readings from Last 3 Encounters:  03/29/17 (!) 58  02/13/17 69  02/08/17 63  O2 sat room air 99%   Physical Exam  Constitutional: She is oriented to person, place, and time and well-developed, well-nourished, and in no distress.  HENT:  Head: Normocephalic and atraumatic.  Mouth/Throat: Oropharynx is clear and moist and mucous membranes are normal.  Eyes: Conjunctivae are normal. Pupils are equal, round, and reactive to light.  Cardiovascular: Regular rhythm and normal heart sounds. Bradycardia present.  Pulmonary/Chest: Effort normal and breath sounds normal.  Abdominal: Soft. Bowel sounds are normal. There is tenderness in the right upper quadrant.  Neurological: She is alert and oriented to person, place, and time. Gait normal. Gait normal.  Skin: Skin is warm, dry and intact.  Psychiatric: Mood, memory, affect and judgment normal.    Assessment    1. Hoarseness 2. HTN 3. PAF 4. 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 Rudene Christians with hip pain as well  5. Vit D def  6. H/o Essential tremor and sensorimotor polyneuropathy and memory loss hx (Dr. Manuella Ghazi) 7. HM 8. Anxiety  9. RUQ ab pain s/p GB removal  Plan  1. Consider ENT if continues already x 1 month  2 and 3. Cont meds controlled today  Check CMET, CBC, TSH, T4, vit D, A1C, hep B/C, lipid today UA had 02/08/17  4. Doing well f/u ortho  5. Check Vit D today refilled 50K vit D weekly today  6. No tx'ed for now disc gabapentin vs benzos pt does not want to be sleepy will not increase BB dose due to bradycardia  Cont to f/u Dr. Manuella Ghazi he Rx cymbalta  7.  Pap no h/o abnormal no GU blood has not had in a while though typically stop age 35.  Colonoscopy last 11/05/14 diverticulosis/hemorrhoids repeat in 10 years also consider cologuard  Ordered mammo (pt wants to go w/in the next month) and DEXA today  Never smoker   Had flu shot, pna vax x 2 consider repeat pna 23 it has been 5 years, had Tdap, zoster consider shingrix in future  8. On cymbalta for #6  9. Labs today consider w/u if continues with [place] at f/u   Provider: Dr. Olivia Mackie McLean-Scocuzza-Internal Medicine

## 2017-04-18 NOTE — Addendum Note (Signed)
Addended by: Orland Mustard on: 04/18/2017 01:47 AM   Modules accepted: Level of Service

## 2017-06-08 ENCOUNTER — Emergency Department: Payer: Medicare Other

## 2017-06-08 ENCOUNTER — Encounter: Payer: Self-pay | Admitting: Emergency Medicine

## 2017-06-08 ENCOUNTER — Other Ambulatory Visit: Payer: Self-pay

## 2017-06-08 DIAGNOSIS — Z7901 Long term (current) use of anticoagulants: Secondary | ICD-10-CM | POA: Diagnosis not present

## 2017-06-08 DIAGNOSIS — I10 Essential (primary) hypertension: Secondary | ICD-10-CM | POA: Insufficient documentation

## 2017-06-08 DIAGNOSIS — R079 Chest pain, unspecified: Secondary | ICD-10-CM | POA: Diagnosis not present

## 2017-06-08 DIAGNOSIS — Z96652 Presence of left artificial knee joint: Secondary | ICD-10-CM | POA: Insufficient documentation

## 2017-06-08 LAB — CBC
HCT: 40.9 % (ref 35.0–47.0)
Hemoglobin: 13.5 g/dL (ref 12.0–16.0)
MCH: 29.4 pg (ref 26.0–34.0)
MCHC: 33.1 g/dL (ref 32.0–36.0)
MCV: 89 fL (ref 80.0–100.0)
PLATELETS: 217 10*3/uL (ref 150–440)
RBC: 4.6 MIL/uL (ref 3.80–5.20)
RDW: 14.3 % (ref 11.5–14.5)
WBC: 7.1 10*3/uL (ref 3.6–11.0)

## 2017-06-08 LAB — BASIC METABOLIC PANEL
Anion gap: 10 (ref 5–15)
BUN: 18 mg/dL (ref 6–20)
CHLORIDE: 98 mmol/L — AB (ref 101–111)
CO2: 31 mmol/L (ref 22–32)
CREATININE: 0.81 mg/dL (ref 0.44–1.00)
Calcium: 9.5 mg/dL (ref 8.9–10.3)
GFR calc non Af Amer: >60 mL/min (ref >60)
Glucose, Bld: 134 mg/dL — ABNORMAL HIGH (ref 65–99)
POTASSIUM: 4.3 mmol/L (ref 3.5–5.1)
SODIUM: 139 mmol/L (ref 135–145)

## 2017-06-08 LAB — TROPONIN I: Troponin I: <0.03 ng/mL

## 2017-06-08 NOTE — ED Triage Notes (Signed)
Pt to triage via w/c with no distress noted; pt reports since 7pm having mid CP radiating to left side and into shoulder/neck

## 2017-06-09 ENCOUNTER — Emergency Department
Admission: EM | Admit: 2017-06-09 | Discharge: 2017-06-09 | Disposition: A | Discharge status: Home / Self Care | Payer: Medicare Other | Attending: Emergency Medicine | Admitting: Emergency Medicine

## 2017-06-09 DIAGNOSIS — R079 Chest pain, unspecified: Secondary | ICD-10-CM

## 2017-06-09 LAB — TROPONIN I: Troponin I: <0.03 ng/mL (ref <0.03)

## 2017-06-09 NOTE — ED Provider Notes (Signed)
[facility] Emergency Department Provider Note    First MD Initiated Contact with Patient 06/09/17 0259     (approximate)  I have reviewed the triage vital signs and the nursing notes.   HISTORY  Chief Complaint Chest Pain   HPI Gina Frazier is a 72 y.o. female with below list of chronic medical conditions presents to the emergency department with intermittent left-sided chest pain is nonradiating lasting approximately a few seconds each episode.  Patient describes it as tingling/squeezing.  Patient denies any discomfort at present.  Patient denies any dyspnea.  Patient denies any nausea or vomiting or diaphoresis.   Past Medical History:  Diagnosis Date  . Allergy   . Anxiety   . Chronic back pain   . Family history of adverse reaction to anesthesia    pts daughter had severe vomiting 2014   . Hip joint pain    left  . Hypertension   . Osteoarthritis   . PAF (paroxysmal atrial fibrillation) ([place])    a. initially diagnosed 04/2014; b. on Eliquis; c. CHADS2VASc = 3  . Palpitations   . Wears contact lenses   . Wears dentures    partial upper    Patient Active Problem List   Diagnosis Date Noted  . Essential tremor 03/31/2017  . Hip pain 03/31/2017  . Hoarseness of voice 03/31/2017  . Hyperglycemia 03/31/2017  . 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 Chronic pain of left knee 02/08/2017  . Weight gain 03/18/2016  . Weakness   . Weight loss   . Paroxysmal atrial fibrillation (H[facility])   . Primary osteoarthritis of knee 05/02/2015  . Chest pain 01/28/2015  . Special screening for malignant neoplasms, colon   . Snoring 05/01/2014  . Anxiety 12/30/2010  . Preoperative cardiovascular examination 12/30/2010  . Hypertension 07/21/2010  . Chronic back pain 07/21/2010  . Vitamin D deficiency 07/21/2010  . SHORTNESS OF BREATH 05/01/2010  . Essential hypertension 04/18/2010  . OSTEOARTHROS UNSPEC GEN/LOC OTH SPEC SITES  04/18/2010  . KNEE PAIN, LEFT 04/18/2010    Past Surgical History:  Procedure Laterality Date  . CHOLECYSTECTOMY    . COLONOSCOPY WITH PROPOFOL N/A 11/05/2014   Procedure: COLONOSCOPY WITH PROPOFOL;  Surgeon: Lucilla Lame, MD;  Location: [facility];  Service: Endoscopy;  Laterality: N/A;  . JOINT REPLACEMENT     left knee x 2 04/2024 and 01/2017 Dr. Rudene Christians   . KNEE ARTHROSCOPY Left 06/28/2014   Procedure: ARTHROSCOPY KNEE WITH PARTIALLATERAL MENISECTOMY AND DEBRIDEMENT;  Surgeon: Susa Day, MD;  Location: [facility];  Service: Orthopedics;  Laterality: Left;  . TOOTH EXTRACTION    . 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 TOTAL KNEE REVISION Left 02/11/2017   Procedure: TOTAL KNEE REVISION, POLYETHELENE EXCHANGE;  Surgeon: Hessie Knows, MD;  Location: [facility];  Service: Orthopedics;  Laterality: Left;    Prior to Admission medications   Medication Sig Start Date End Date Taking? Authorizing Provider  acetaminophen (TYLENOL) 500 MG tablet Take 1,000 mg every 8 (eight) hours as needed by mouth for mild pain.    [provider]  Cholecalciferol 50000 units capsule Take 1 capsule (50,000 Units total) by mouth once a week. 03/29/17   McLean-Scocuzza, Nino Glow, MD  DULoxetine (CYMBALTA) 20 MG capsule Take 40 mg daily by mouth.    [provider]  Arne Cleveland  5 MG TABS tablet TAKE 1 TABLET BY MOUTH 2 TIMES DAILY 03/22/17   Minna Merritts, MD  flecainide (TAMBOCOR) 50 MG tablet TAKE 1 TABLET BY MOUTH 2 TIMES DAILY 03/22/17   Minna Merritts, MD  propranolol ER (INDERAL LA) 80 MG 24 hr capsule TAKE 1 CAPSULE BY MOUTH DAILY 03/22/17   Minna Merritts, MD    Allergies Fish allergy  Family History  Problem Relation Age of Onset  . Heart disease Mother   . Hypertension Mother   . Mental illness Mother   . Diabetes Mother   . Heart disease  Father   . Hypertension Sister   . Depression Sister   . Diabetes Sister   . Heart disease Brother   . Hypertension Brother   . Arthritis Daughter   . GER disease Daughter   . Hypothyroidism Daughter   . Lupus Daughter   . Cancer Other        colon cancer, breast cancer   . Heart disease Other     Social History Social History   Tobacco Use  . Smoking status: Never Smoker  . Smokeless tobacco: Never Used  Substance Use Topics  . Alcohol use: No  . Drug use: No    Review of Systems Constitutional: No fever/chills Eyes: No visual changes. ENT: No sore throat. Cardiovascular: Positive for chest pain. Respiratory: Denies shortness of breath. Gastrointestinal: No abdominal pain.  No nausea, no vomiting.  No diarrhea.  No constipation. Genitourinary: Negative for dysuria. Musculoskeletal: Negative for neck pain.  Negative for back pain. Integumentary: Negative for rash. Neurological: Negative for headaches, focal weakness or numbness.   ____________________________________________   PHYSICAL EXAM:  VITAL SIGNS: ED Triage Vitals  Enc Vitals Group     BP 06/08/17 2208 (!) 189/70     Pulse Rate 06/08/17 2208 63     Resp 06/08/17 2208 18     Temp 06/08/17 2208 98.1 F (36.7 C)     Temp Source 06/08/17 2208 Oral     SpO2 06/08/17 2208 97 %     Weight 06/08/17 2209 71.7 kg (158 lb)     Height 06/08/17 2209 1.575 m (5\' 2" )     Head Circumference --      Peak Flow --      Pain Score 06/08/17 2209 9     Pain Loc --      Pain Edu? --      Excl. in [place]? --     Constitutional: Alert and oriented. Well appearing and in no acute distress. Eyes: Conjunctivae are normal. Head: Atraumatic. Mouth/Throat: Mucous membranes are moist.  Oropharynx non-erythematous. Neck: No stridor.   Cardiovascular: Normal rate, regular rhythm. Good peripheral circulation. Grossly normal heart sounds. Respiratory: Normal respiratory effort.  No retractions. Lungs CTAB. Gastrointestinal:  Soft and nontender. No distention.  Musculoskeletal: No lower extremity tenderness nor edema. No gross deformities of extremities. Neurologic:  Normal speech and language. No gross focal neurologic deficits are appreciated.  Skin:  Skin is warm, dry and intact. No rash noted. Psychiatric: Mood and affect are normal. Speech and behavior are normal.  ____________________________________________   LABS (all labs ordered are listed, but only abnormal results are displayed)  Labs Reviewed  BASIC METABOLIC PANEL - Abnormal; Notable for the following components:      Result Value   Chloride 98 (*)    Glucose, Bld 134 (*)    All other components within normal limits  CBC  TROPONIN I  TROPONIN  I   ____________________________________________  EKG  ED ECG REPORT I, [place] N BROWN, the attending physician, personally viewed and interpreted this ECG.   Date: 06/08/2017  EKG Time: 10:04 PM  Rate: 64  Rhythm: Normal sinus rhythm  Axis: Normal  Intervals: Normal  ST&T Change: None  ____________________________________________  RADIOLOGY I, [place] N BROWN, personally viewed and evaluated these images (plain radiographs) as part of my medical decision making, as well as reviewing the written report by the radiologist.  ED MD interpretation: No acute cardiopulmonary findings.  Official radiology report(s): Dg Chest 2 View  Result Date: 06/08/2017 CLINICAL DATA:  72 year old female with chest pain. EXAM: CHEST - 2 VIEW COMPARISON:  Chest radiograph dated 06/11/2015 FINDINGS: Mild peribronchial densities may represent bronchitis or viral infection. Clinical correlation is recommended. No focal consolidation, pleural effusion, or pneumothorax. The cardiac silhouette is within normal limits. No acute osseous pathology. IMPRESSION: No focal consolidation. Findings may represent reactive small airway disease versus viral infection. Clinical correlation is recommended. Electronically Signed    By: Anner Crete M.D.   On: 06/08/2017 22:22      Procedures   ____________________________________________   INITIAL IMPRESSION / ASSESSMENT AND PLAN / ED COURSE  As part of my medical decision making, I reviewed the following data within the electronic MEDICAL RECORD NUMBER    72 year old female presented with above-stated history and physical exam secondary to chest pain.  Patient has no chest pain at present.  Consider possibly CAD however EKG and troponin x2-.  Consider the possibility of angina and as such patient will be referred to Dr. Rockey Situ for further outpatient evaluation.  Also consider possibility of pulmonary emboli however patient currently taking Xarelto secondary to A. fib and is compliant twice daily.    ____________________________________________  FINAL CLINICAL IMPRESSION(S) / ED DIAGNOSES  Final diagnoses:  Chest pain, unspecified type     MEDICATIONS GIVEN DURING THIS VISIT:  Medications - No data to display   ED Discharge Orders    None       Note:  This document was prepared using Dragon voice recognition software and may include unintentional dictation errors.    Gregor Hams, MD 06/09/17 [phone]

## 2017-06-14 NOTE — Progress Notes (Addendum)
Patient ID: Gina Frazier, female   DOB: 04-09-1945, 72 y.o.   MRN: [id number]   Cardiology Office Note  Date:  06/16/2017   ID:  Gina Frazier, DOB 11/23/1945, MRN [id number]  PCP:  McLean-Scocuzza, Nino Glow, MD   Chief Complaint  Patient presents with  . other    F/u ED chest pain c/o feels like "needles in the chest". Meds reviewed verbally with pt.    HPI:  Gina Frazier is a 72 year old woman with past medical history of Hypertension Evaluated in 2012 for acute hypertension in the dentist chair,  hospital with  atrial fibrillation on 04/28/2014, started on Cardizem, Lopressor converting to normal sinus rhythm (Prior episode 6 months prior) started on anticoagulation with eliquis 5 mg by mouth twice a day,  flecainide 50 mg twice a day, continued on beta blocker. 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  No significant shortness of breath or chest pain, no leg edema She denies any tachycardia palpitations concerning for atrial fibrillation  Chronic back pain, unable to work  EKG personally reviewed by myself on todays visit Shows normal sinus rhythm rate 52 bpm nonspecific T wave abnormality inferior anterior precordial leads home  Other past medical history reviewed Knee replacement February 2017 Back in the hospital March 2017 with weakness and chills, had been treated for urinary tract infection, Dehydrated In the emergency room had 14 beat run of Vtach (this was in fact  artifact on further review) Echocardiogram showed normal ejection fraction 06/12/2015  Echocardiogram from the hospital shows ejection fraction 60-65%, otherwise normal study  PMH:   has a past medical history of Allergy, Anxiety, Chronic back pain, Family history of adverse reaction to anesthesia, Hip joint pain, Hypertension, Osteoarthritis, PAF (paroxysmal atrial fibrillation) ([place]), Palpitations, Wears contact lenses, and Wears dentures.  PSH:    Past Surgical History:  Procedure Laterality Date  . CHOLECYSTECTOMY    . COLONOSCOPY WITH PROPOFOL N/A 11/05/2014   Procedure: COLONOSCOPY WITH PROPOFOL;  Surgeon: Lucilla Lame, MD;  Location: [place];  Service: Endoscopy;  Laterality: N/A;  . JOINT REPLACEMENT     left knee x 2 04/2024 and 01/2017 Dr. Rudene Christians   . KNEE ARTHROSCOPY Left 06/28/2014   Procedure: ARTHROSCOPY KNEE WITH PARTIALLATERAL MENISECTOMY AND DEBRIDEMENT;  Surgeon: Susa Day, MD;  Location: [facility];  Service: Orthopedics;  Laterality: Left;  . TOOTH EXTRACTION    . 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 TOTAL KNEE REVISION Left 02/11/2017   Procedure: TOTAL KNEE REVISION, POLYETHELENE EXCHANGE;  Surgeon: Hessie Knows, MD;  Location: [facility];  Service: Orthopedics;  Laterality: Left;    Current Outpatient Medications  Medication Sig Dispense Refill  . acetaminophen (TYLENOL) 500 MG tablet Take 1,000 mg every 8 (eight) hours as needed by mouth for mild pain.    Marland Kitchen apixaban (ELIQUIS) 5 MG TABS tablet Take 1 tablet (5 mg total) by mouth 2 (two) times daily. [place]  tablet 3  . DULoxetine (CYMBALTA) 20 MG capsule Take 40 mg daily by mouth.    . ergocalciferol (VITAMIN D2) 50000 units capsule Take 50,000 Units once a week by mouth. Saturdays    . flecainide (TAMBOCOR) 50 MG tablet Take 1 tablet (50 mg total) by mouth 2 (two) times daily. 60  tablet 11  . nitroGLYCERIN (NITROSTAT) 0.4 MG SL tablet Place 1 tablet (0.4 mg total) under the tongue every 5 (five) minutes as needed for chest pain. Call 911 if pain not resolved after 3 pills 15 tablet 1  . propranolol ER (INDERAL LA) 80 MG 24 hr capsule Take 1 capsule (80 mg total) by mouth daily. 30 capsule 11   No current facility-administered medications for this visit.      Allergies:   Fish allergy   Social History:  The patient  reports that she has never smoked. She has never used smokeless tobacco. She reports that she does not drink alcohol or use drugs.   Family History:   family history includes Arthritis in her daughter; Cancer in her other; Depression in her sister; Diabetes in her mother and sister; GER disease in her daughter; Heart disease in her brother, father, mother, and other; Hypertension in her brother, mother, and sister; Hypothyroidism in her daughter; Lupus in her daughter; Mental illness in her mother.    Review of Systems: Review of Systems  Constitutional: Negative.        Weight gain  Respiratory: Negative.   Cardiovascular: Negative.   Gastrointestinal: Negative.   Musculoskeletal: Positive for back pain and joint pain.  Neurological: Negative.   Psychiatric/Behavioral: The patient is nervous/anxious.   All other systems reviewed and are negative.    PHYSICAL EXAM: VS:  BP (!) 142/64 (BP Location: Left Arm, Patient Position: Sitting, Cuff Size: Normal)   Pulse (!) 52   Ht 5\' 2"  (1.575 m)   Wt 161 lb 12 oz (73.4 kg)   BMI 29.58 kg/m  , BMI Body mass index is 29.58 kg/m.  Constitutional:  oriented to person, place, and time. No distress.  HENT:  Head: Normocephalic and atraumatic.  Eyes:  no discharge. No scleral icterus.  Neck: Normal range of motion. Neck supple. No JVD present.  Cardiovascular: Normal rate, regular rhythm, normal heart sounds and intact distal pulses. Exam reveals no gallop and no friction rub. No edema No murmur  heard. Pulmonary/Chest: Effort normal and breath sounds normal. No stridor. No respiratory distress.  no wheezes.  no rales.  no tenderness.  Abdominal: Soft.  no distension.  no tenderness.  Musculoskeletal: Normal range of motion.  no  tenderness or deformity.  Very tight paravertebral muscles bilaterally worse on the left than the right Left shoulder seems to hang lower than the right when viewed posteriorly Neurological:  normal muscle tone. Coordination normal. No atrophy Skin: Skin is warm and dry. No rash noted. not diaphoretic.  Psychiatric:  normal mood and affect. behavior is normal. Thought content normal.    Recent Labs: 03/29/2017: ALT 5; TSH 3.78 06/08/2017: BUN 18; Creatinine, Ser 0.81; Hemoglobin 13.5; Platelets 217; Potassium 4.3; Sodium 139    Lipid Panel Lab Results  Component Value Date   CHOL 193 03/29/2017   HDL 69.00 03/29/2017   LDLCALC 110 (H) 03/29/2017   TRIG 68.0 03/29/2017      Wt Readings from Last 3 Encounters:  06/16/17 161 lb 12 oz (73.4 kg)  06/08/17 158 lb (71.7 kg)  03/29/17 164 lb 6 oz (74.6 kg)  ASSESSMENT AND PLAN:  PAF (paroxysmal atrial fibrillation) ([place]) - Plan: EKG 12-Lead Denies any symptoms concerning for atrial fibrillation. No medication changes made Stable  Essential hypertension Blood pressure is well controlled on today's visit. No changes made to the medications.  Chest pain, unspecified chest pain type Tingling left chest  radiating up left posterior aspect of her neck left arm now getting better since June 08, 2017 Very tight paravertebral muscles lower shoulder on the left than the right Concerning for nerve impingement,  Slowly resolving  Anxiety  Reports this is stable Previously with financial stressors, lost her job  Disposition:   F/U 12 months  Long discussion concerning recent events, emergency room visit neck issues  Total encounter time more than 45 minutes  Greater than 50% was spent in  counseling and coordination of care with the patient    Orders Placed This Encounter  Procedures  . EKG 12-Lead      Signed, Esmond Plants, M.D., Ph.D. 06/16/2017  [place], [place]

## 2017-06-16 ENCOUNTER — Ambulatory Visit (INDEPENDENT_AMBULATORY_CARE_PROVIDER_SITE_OTHER): Payer: Medicare Other | Admitting: Cardiovascular Disease

## 2017-06-16 ENCOUNTER — Encounter: Payer: Self-pay | Admitting: Cardiovascular Disease

## 2017-06-16 VITALS — BP 142/64 | HR 52 | Ht 62.0 in | Wt 161.8 lb

## 2017-06-16 DIAGNOSIS — I480 Paroxysmal atrial fibrillation: Secondary | ICD-10-CM | POA: Diagnosis not present

## 2017-06-16 DIAGNOSIS — I10 Essential (primary) hypertension: Secondary | ICD-10-CM | POA: Diagnosis not present

## 2017-06-16 DIAGNOSIS — R079 Chest pain, unspecified: Secondary | ICD-10-CM | POA: Diagnosis not present

## 2017-06-16 MED ORDER — FLECAINIDE ACETATE 50 MG PO TABS: 50.0000 mg | ORAL_TABLET | Freq: Two times a day (BID) | ORAL | 3 refills | Status: DC

## 2017-06-16 MED ORDER — APIXABAN 5 MG PO TABS: 5.0000 mg | ORAL_TABLET | Freq: Two times a day (BID) | ORAL | 3 refills | Status: DC

## 2017-06-16 MED ORDER — PROPRANOLOL HCL ER 80 MG PO CP24: 80.0000 mg | ORAL_CAPSULE | Freq: Every day | ORAL | 3 refills | Status: DC

## 2017-06-16 NOTE — Patient Instructions (Signed)
Medication Instructions:   No medication changes made  If you feel heart rate is too slow, Call the office We would decrease the propranolol down to 60 mg daily (from 80 mg)  Labwork:  No new labs needed  Testing/Procedures:  No further testing at this time   Follow-Up: It was a pleasure seeing you in the office today. Please call us if you have new issues that need to be addressed before your next appt.  [phone]  Your physician wants you to follow-up in: 12 months.  You will receive a reminder letter in the mail two months in advance. If you don't receive a letter, please call our office to schedule the follow-up appointment.  If you need a refill on your cardiac medications before your next appointment, please call your pharmacy.  For educational health videos Log in to : [email or web address] Or : [email or web address], password : [email or web address]

## 2017-06-29 ENCOUNTER — Ambulatory Visit: Payer: Medicare Other | Admitting: Internal Medicine

## 2017-07-05 ENCOUNTER — Ambulatory Visit (INDEPENDENT_AMBULATORY_CARE_PROVIDER_SITE_OTHER): Payer: Medicare Other | Admitting: Internal Medicine

## 2017-07-05 ENCOUNTER — Encounter: Payer: Self-pay | Admitting: Internal Medicine

## 2017-07-05 VITALS — BP 114/70 | HR 59 | Temp 98.2°F | Ht 62.0 in | Wt 160.0 lb

## 2017-07-05 DIAGNOSIS — I4891 Unspecified atrial fibrillation: Secondary | ICD-10-CM | POA: Diagnosis not present

## 2017-07-05 DIAGNOSIS — E2839 Other primary ovarian failure: Secondary | ICD-10-CM | POA: Diagnosis not present

## 2017-07-05 DIAGNOSIS — R42 Dizziness and giddiness: Secondary | ICD-10-CM

## 2017-07-05 DIAGNOSIS — R001 Bradycardia, unspecified: Secondary | ICD-10-CM | POA: Diagnosis not present

## 2017-07-05 DIAGNOSIS — R002 Palpitations: Secondary | ICD-10-CM | POA: Diagnosis not present

## 2017-07-05 DIAGNOSIS — Z1231 Encounter for screening mammogram for malignant neoplasm of breast: Secondary | ICD-10-CM

## 2017-07-05 DIAGNOSIS — I951 Orthostatic hypotension: Secondary | ICD-10-CM | POA: Insufficient documentation

## 2017-07-05 MED ORDER — PROPRANOLOL HCL ER 60 MG PO CP24: 60.0000 mg | ORAL_CAPSULE | Freq: Every day | ORAL | 3 refills | Status: DC

## 2017-07-05 NOTE — Patient Instructions (Addendum)
Consider shingrix new shingles vaccine at your pharmacy  Increase water intake  Cut inderal down to 60 mg daily new prescription sent to pharmacy  If your heart races again follow up with Dr. Rockey Situ and check pulse and blood sugar  Schedule mammogram, and [facility] density at [facility]   Orthostatic Hypotension Orthostatic hypotension is a sudden drop in blood pressure that happens when you quickly change positions, such as when you get up from a seated or lying position. Blood pressure is a measurement of how strongly, or weakly, your blood is pressing against the walls of your arteries. Arteries are blood vessels that carry blood from your heart throughout your body. When blood pressure is too low, you may not get enough blood to your brain or to the rest of your organs. This can cause weakness, light-headedness, rapid heartbeat, and fainting. This can last for just a few seconds or for up to a few minutes. Orthostatic hypotension is usually not a serious problem. However, if it happens frequently or gets worse, it may be a sign of something more serious. What are the causes? This condition may be caused by:  Sudden changes in posture, such as standing up quickly after you have been sitting or lying down.  Blood loss.  Loss of body fluids (dehydration).  Heart problems.  Hormone (endocrine) problems.  Pregnancy.  Severe infection.  Lack of certain nutrients.  Severe allergic reactions (anaphylaxis).  Certain medicines, such as blood pressure medicine or medicines that make the body lose excess fluids (diuretics). Sometimes, this condition can be caused by not taking medicine as directed, such as taking too much of a certain medicine.  What increases the risk? Certain factors can make you more likely to develop orthostatic hypotension, including:  Age. Risk increases as you get older.  Conditions that affect the heart or the central nervous system.  Taking certain medicines,  such as blood pressure medicine or diuretics.  Being pregnant.  What are the signs or symptoms? Symptoms of this condition may include:  Weakness.  Light-headedness.  Dizziness.  Blurred vision.  Fatigue.  Rapid heartbeat.  Fainting, in severe cases.  How is this diagnosed? This condition is diagnosed based on:  Your medical history.  Your symptoms.  Your blood pressure measurement. Your health care provider will check your blood pressure when you are: ? Lying down. ? Sitting. ? Standing.  A blood pressure reading is recorded as two numbers, such as "120 over 80" (or 120/80). The first ("top") number is called the systolic pressure. It is a measure of the pressure in your arteries as your heart beats. The second ("bottom") number is called the diastolic pressure. It is a measure of the pressure in your arteries when your heart relaxes between beats. Blood pressure is measured in a unit called mm Hg. Healthy blood pressure for adults is 120/80. If your blood pressure is below 90/60, you may be diagnosed with hypotension. Other information or tests that may be used to diagnose orthostatic hypotension include:  Your other vital signs, such as your heart rate and temperature.  Blood tests.  Tilt table test. For this test, you will be safely secured to a table that moves you from a lying position to an upright position. Your heart rhythm and blood pressure will be monitored during the test.  How is this treated? Treatment for this condition may include:  Changing your diet. This may involve eating more salt (sodium) or drinking more water.  Taking medicines  to raise your blood pressure.  Changing the dosage of certain medicines you are taking that might be lowering your blood pressure.  Wearing compression stockings. These stockings help to prevent blood clots and reduce swelling in your legs.  In some cases, you may need to go to the hospital for:  Fluid  replacement. This means you will receive fluids through an IV tube.  Blood replacement. This means you will receive donated blood through an IV tube (transfusion).  Treating an infection or heart problems, if this applies.  Monitoring. You may need to be monitored while medicines that you are taking wear off.  Follow these instructions at home: Eating and drinking   Drink enough fluid to keep your urine clear or pale yellow.  Eat a healthy diet and follow instructions from your health care provider about eating or drinking restrictions. A healthy diet includes: ? Fresh fruits and vegetables. ? Whole grains. ? Lean meats. ? Low-fat dairy products.  Eat extra salt only as directed. Do not add extra salt to your diet unless your health care provider told you to do that.  Eat frequent, small meals.  Avoid standing up suddenly after eating. Medicines  Take over-the-counter and prescription medicines only as told by your health care provider. ? Follow instructions from your health care provider about changing the dosage of your current medicines, if this applies. ? Do not stop or adjust any of your medicines on your own. General instructions  Wear compression stockings as told by your health care provider.  Get up slowly from lying down or sitting positions. This gives your blood pressure a chance to adjust.  Avoid hot showers and excessive heat as directed by your health care provider.  Return to your normal activities as told by your health care provider. Ask your health care provider what activities are safe for you.  Do not use any products that contain nicotine or tobacco, such as cigarettes and e-cigarettes. If you need help quitting, ask your health care provider.  Keep all follow-up visits as told by your health care provider. This is important. Contact a health care provider if:  You vomit.  You have diarrhea.  You have a fever for more than 2-3 days.  You feel  more thirsty than usual.  You feel weak and tired. Get help right away if:  You have chest pain.  You have a fast or irregular heartbeat.  You develop numbness in any part of your body.  You cannot move your arms or your legs.  You have trouble speaking.  You become sweaty or feel lightheaded.  You faint.  You feel short of breath.  You have trouble staying awake.  You feel confused. This information is not intended to replace advice given to you by your health care provider. Make sure you discuss any questions you have with your health care provider. Document Released: 02/20/2002 Document Revised: 11/19/2015 Document Reviewed: 08/23/2015 Elsevier Interactive Patient Education  2018 [employer]. Cholesterol Cholesterol is a white, waxy, fat-like substance that is needed by the human body in small amounts. The liver makes all the cholesterol we need. Cholesterol is carried from the liver by the blood through the blood vessels. Deposits of cholesterol (plaques) may build up on blood vessel (artery) walls. Plaques make the arteries narrower and stiffer. Cholesterol plaques increase the risk for heart attack and stroke. You cannot feel your cholesterol level even if it is very high. The only way to know that it is  high is to have a blood test. Once you know your cholesterol levels, you should keep a record of the test results. Work with your health care provider to keep your levels in the desired range. What do the results mean?  Total cholesterol is a rough measure of all the cholesterol in your blood.  LDL (low-density lipoprotein) is the "bad" cholesterol. This is the type that causes plaque to build up on the artery walls. You want this level to be low.  HDL (high-density lipoprotein) is the "good" cholesterol because it cleans the arteries and carries the LDL away. You want this level to be high.  Triglycerides are fat that the body can either burn for energy or store. High  levels are closely linked to heart disease. What are the desired levels of cholesterol?  Total cholesterol below 200.  LDL below 100 for people who are at risk, below 70 for people at very high risk.  HDL above 40 is good. A level of 60 or higher is considered to be protective against heart disease.  Triglycerides below 150. How can I lower my cholesterol? Diet Follow your diet program as told by your health care provider.  Choose fish or white meat chicken and [place], roasted or baked. Limit fatty cuts of red meat, fried foods, and processed meats, such as sausage and lunch meats.  Eat lots of fresh fruits and vegetables.  Choose whole grains, beans, pasta, potatoes, and cereals.  Choose olive oil, corn oil, or canola oil, and use only small amounts.  Avoid butter, mayonnaise, shortening, or palm kernel oils.  Avoid foods with trans fats.  Drink skim or nonfat milk and eat low-fat or nonfat yogurt and cheeses. Avoid whole milk, cream, ice cream, egg yolks, and full-fat cheeses.  Healthier desserts include angel food cake, ginger snaps, animal crackers, hard candy, popsicles, and low-fat or nonfat frozen yogurt. Avoid pastries, cakes, pies, and cookies.  Exercise  Follow your exercise program as told by your health care provider. A regular program: ? Helps to decrease LDL and raise HDL. ? Helps with weight control.  Do things that increase your activity level, such as gardening, walking, and taking the stairs.  Ask your health care provider about ways that you can be more active in your daily life.  Medicine  Take over-the-counter and prescription medicines only as told by your health care provider. ? Medicine may be prescribed by your health care provider to help lower cholesterol and decrease the risk for heart disease. This is usually done if diet and exercise have failed to bring down cholesterol levels. ? If you have several risk factors, you may need medicine even if  your levels are normal.  This information is not intended to replace advice given to you by your health care provider. Make sure you discuss any questions you have with your health care provider. Document Released: 11/25/2000 Document Revised: 09/28/2015 Document Reviewed: 08/31/2015 Elsevier Interactive Patient Education  2018 [employer].  Dizziness Dizziness is a common problem. It is a feeling of unsteadiness or light-headedness. You may feel like you are about to faint. Dizziness can lead to injury if you stumble or fall. Anyone can become dizzy, but dizziness is more common in older adults. This condition can be caused by a number of things, including medicines, dehydration, or illness. Follow these instructions at home: Eating and drinking  Drink enough fluid to keep your urine clear or pale yellow. This helps to keep you from becoming dehydrated.  Try to drink more clear fluids, such as water.  Do not drink alcohol.  Limit your caffeine intake if told to do so by your health care provider. Check ingredients and nutrition facts to see if a food or beverage contains caffeine.  Limit your salt (sodium) intake if told to do so by your health care provider. Check ingredients and nutrition facts to see if a food or beverage contains sodium. Activity  Avoid making quick movements. ? Rise slowly from chairs and steady yourself until you feel okay. ? In the morning, first sit up on the side of the bed. When you feel okay, stand slowly while you hold onto something until you know that your balance is fine.  If you need to stand in one place for a long time, move your legs often. Tighten and relax the muscles in your legs while you are standing.  Do not drive or use heavy machinery if you feel dizzy.  Avoid bending down if you feel dizzy. Place items in your home so that they are easy for you to reach without leaning over. Lifestyle  Do not use any products that contain nicotine or  tobacco, such as cigarettes and e-cigarettes. If you need help quitting, ask your health care provider.  Try to reduce your stress level by using methods such as yoga or meditation. Talk with your health care provider if you need help to manage your stress. General instructions  Watch your dizziness for any changes.  Take over-the-counter and prescription medicines only as told by your health care provider. Talk with your health care provider if you think that your dizziness is caused by a medicine that you are taking.  Tell a friend or a family member that you are feeling dizzy. If he or she notices any changes in your behavior, have this person call your health care provider.  Keep all follow-up visits as told by your health care provider. This is important. Contact a health care provider if:  Your dizziness does not go away.  Your dizziness or light-headedness gets worse.  You feel nauseous.  You have reduced hearing.  You have new symptoms.  You are unsteady on your feet or you feel like the room is spinning. Get help right away if:  You vomit or have diarrhea and are unable to eat or drink anything.  You have problems talking, walking, swallowing, or using your arms, hands, or legs.  You feel generally weak.  You are not thinking clearly or you have trouble forming sentences. It may take a friend or family member to notice this.  You have chest pain, abdominal pain, shortness of breath, or sweating.  Your vision changes.  You have any bleeding.  You have a severe headache.  You have neck pain or a stiff neck.  You have a fever. These symptoms may represent a serious problem that is an emergency. Do not wait to see if the symptoms will go away. Get medical help right away. Call your local emergency services (911 in the [place] Do not drive yourself to the hospital. Summary  Dizziness is a feeling of unsteadiness or light-headedness. This condition can be caused by  a number of things, including medicines, dehydration, or illness.  Anyone can become dizzy, but dizziness is more common in older adults.  Drink enough fluid to keep your urine clear or pale yellow. Do not drink alcohol.  Avoid making quick movements if you feel dizzy. Monitor your dizziness for any changes.  This information is not intended to replace advice given to you by your health care provider. Make sure you discuss any questions you have with your health care provider. Document Released: 08/26/2000 Document Revised: 04/04/2016 Document Reviewed: 04/04/2016 Elsevier Interactive Patient Education  [employer].

## 2017-07-05 NOTE — Progress Notes (Signed)
Chief Complaint  Patient presents with  . Follow-up  . Hypertension   F/u  1. HTN controlled on Inderal LA 80 mg qd  2. H/o Afib f/u D.r Rockey Situ saw 06/2017 wants to see in 1 year  3. See reports dizziness episodes intermittent 2 hours after eating with yuca, syrup or foods like that dizziness lasts seconds and goes away after seconds and she feels like she is going to pass out but does not room is not spinning. MRI 01/05/17 negative. She does not have meter to check sugar after this happens but disc she could buy one and see. She also reports palpitations with episodes reported to pt if this happens again call cards to f/u  4. PHQ 9 score 8 today   Review of Systems  Constitutional: Negative for weight loss.  HENT: Negative for hearing loss.   Eyes: Negative for blurred vision.  Respiratory: Negative for shortness of breath.   Cardiovascular: Positive for palpitations.  Gastrointestinal: Negative for abdominal pain.  Musculoskeletal: Negative for falls.  Skin: Negative for rash.  Neurological: Positive for dizziness.  Psychiatric/Behavioral: Negative for depression.   Past Medical History:  Diagnosis Date  . Allergy   . Anxiety   . Chronic back pain   . Family history of adverse reaction to anesthesia    pts daughter had severe vomiting 2014   . Hip joint pain    left  . Hypertension   . Osteoarthritis   . PAF (paroxysmal atrial fibrillation) ([place])    a. initially diagnosed 04/2014; b. on Eliquis; c. CHADS2VASc = 3  . Palpitations   . Wears contact lenses   . Wears dentures    partial upper   Past Surgical History:  Procedure Laterality Date  . CHOLECYSTECTOMY    . COLONOSCOPY WITH PROPOFOL N/A 11/05/2014   Procedure: COLONOSCOPY WITH PROPOFOL;  Surgeon: Lucilla Lame, MD;  Location: [place] [facility];  Service: Endoscopy;  Laterality: N/A;  . JOINT REPLACEMENT     left knee x 2 04/2024 and 01/2017 Dr. Rudene Christians   . KNEE ARTHROSCOPY Left 06/28/2014   Procedure: ARTHROSCOPY KNEE  WITH PARTIALLATERAL MENISECTOMY AND DEBRIDEMENT;  Surgeon: Susa Day, MD;  Location: [facility];  Service: Orthopedics;  Laterality: Left;  . TOOTH EXTRACTION    . 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 TOTAL KNEE REVISION Left 02/11/2017   Procedure: TOTAL KNEE REVISION, POLYETHELENE EXCHANGE;  Surgeon: Hessie Knows, MD;  Location: [facility];  Service: Orthopedics;  Laterality: Left;   Family History  Problem Relation Age of Onset  . Heart disease Mother   . Hypertension Mother   . Mental illness Mother   . Diabetes Mother   . Heart disease Father   . Hypertension Sister   . Depression Sister   . Diabetes Sister   . Heart disease Brother   . Hypertension Brother   . Arthritis Daughter   . GER disease Daughter   . Hypothyroidism Daughter   . Lupus Daughter   . Cancer Other        colon cancer, breast cancer   . Heart disease Other    Social History   Socioeconomic History  . Marital status: Single    Spouse name: Not on file  . Number of children: Not on file  . Years of education: Not on file  . Highest education level: Not on file  Occupational History  . Not on file  Social Needs  . Financial resource strain: Not on file  . Food insecurity:    Worry: Not on file    Inability: Not on file  . Transportation needs:    Medical: Not on file    Non-medical: Not on file  Tobacco Use  . Smoking status: Never Smoker  . Smokeless tobacco: Never Used  Substance and Sexual Activity  . Alcohol use: No  . Drug use: No  . Sexual activity: Never  Lifestyle  . Physical activity:    Days per week: Not on file    Minutes per session: Not on file  . Stress: Not on file  Relationships  . Social connections:    Talks on phone: Not on file    Gets together: Not on file    Attends religious service: Not on file    Active member of club or  organization: Not on file    Attends meetings of clubs or organizations: Not on file    Relationship status: Not on file  . Intimate partner violence:    Fear of current or ex partner: Not on file    Emotionally abused: Not on file    Physically abused: Not on file    Forced sexual activity: Not on file  Other Topics Concern  . Not on file  Social History Narrative   2 daughters 1 lives [place] the other [place] pt visits both    Current Meds  Medication Sig  . acetaminophen (TYLENOL) 500 MG tablet Take 1,000 mg every 8 (eight) hours as needed by mouth for mild pain.  Marland Kitchen apixaban (ELIQUIS) 5 MG TABS tablet Take 1 tablet (5 mg total) by mouth 2 (two) times daily.  . Cholecalciferol (VITAMIN D) 2000 units tablet Take 2,000 Units by mouth daily.  . flecainide (TAMBOCOR) 50 MG tablet Take 1 tablet (50 mg total) by mouth 2 (two) times daily.  . propranolol ER (INDERAL LA) 80 MG 24 hr capsule Take 1 capsule (80 mg total) by mouth daily.   Allergies  Allergen Reactions  . Fish Allergy Nausea And Vomiting, Swelling and Rash   Recent Results (from the past 2160 hour(s))  Basic metabolic panel     Status: Abnormal   Collection Time: 06/08/17 10:12 PM  Result Value Ref Range   Sodium 139 135 - 145 mmol/L   Potassium 4.3 3.5 - 5.1 mmol/L   Chloride 98 (L) 101 - 111 mmol/L   CO2 31 22 - 32 mmol/L   Glucose, Bld 134 (H) 65 - 99 mg/dL   BUN 18 6 - 20 mg/dL   Creatinine, Ser 0.81 0.44 - 1.00 mg/dL   Calcium 9.5 8.9 - 10.3 mg/dL   GFR calc non Af Amer >60 >60 mL/min   GFR calc Af Amer >60 >60 mL/min    Comment: (NOTE) The eGFR has been calculated using the CKD EPI equation. This calculation has not been validated in all clinical situations. eGFR's persistently <60 mL/min signify possible Chronic Kidney Disease.    Anion gap 10 5 - 15    Comment: Performed at [facility], [address] [place], [place] [address]  CBC     Status: None   Collection Time: 06/08/17 10:12 PM   Result Value Ref Range   WBC 7.1 3.6 - 11.0 K/uL   RBC 4.60 3.80 - 5.20 MIL/uL   Hemoglobin 13.5 12.0 - 16.0 g/dL   HCT 40.9 35.0 - 47.0 %  MCV 89.0 80.0 - 100.0 fL   MCH 29.4 26.0 - 34.0 pg   MCHC 33.1 32.0 - 36.0 g/dL   RDW 14.3 11.5 - 14.5 %   Platelets 217 150 - 440 K/uL    Comment: Performed at [facility], [address] [place], [place] [address]  Troponin I     Status: None   Collection Time: 06/08/17 10:12 PM  Result Value Ref Range   Troponin I <0.03 <0.03 ng/mL    Comment: Performed at [facility], [address] [place], [place] [address]  Troponin I     Status: None   Collection Time: 06/09/17  3:13 AM  Result Value Ref Range   Troponin I <0.03 <0.03 ng/mL    Comment: Performed at [facility], [address] [place], [place] [address]   Objective  Body mass index is 29.26 kg/m. Wt Readings from Last 3 Encounters:  07/05/17 160 lb (72.6 kg)  06/16/17 161 lb 12 oz (73.4 kg)  06/08/17 158 lb (71.7 kg)   Temp Readings from Last 3 Encounters:  07/05/17 98.2 F (36.8 C) (Oral)  06/08/17 98.1 F (36.7 C) (Oral)  03/29/17 98.1 F (36.7 C) (Oral)   BP Readings from Last 3 Encounters:  07/05/17 114/70  06/16/17 (!) 142/64  06/09/17 (!) 170/104   Pulse Readings from Last 3 Encounters:  07/05/17 (!) 59  06/16/17 (!) 52  06/09/17 62    Physical Exam  Constitutional: She is oriented to person, place, and time. She appears well-developed and well-nourished. She is cooperative.  HENT:  Head: Normocephalic and atraumatic.  Mouth/Throat: Oropharynx is clear and moist and mucous membranes are normal.  Eyes: Pupils are equal, round, and reactive to light. Conjunctivae are normal.  Cardiovascular: Regular rhythm and normal heart sounds. Bradycardia present.  Pulmonary/Chest: Effort normal and breath sounds normal.  Neurological: She is alert and oriented to person, place, and time. Gait normal.  Tremor on exam   Skin: Skin is  warm, dry and intact.  Psychiatric: She has a normal mood and affect. Her speech is normal and behavior is normal. Judgment and thought content normal. Cognition and memory are normal.  Nursing note and vitals reviewed.   Assessment   1. HTN 2. Dizziness +orthostatic hypotension and palpitations and bradycardia  3. HM Plan  1. Cont inderal la  Consider reduce dose 80 to 60 qd  2.  Orthostatics + today 152/68 HR 54 lying, sitting 134/62 HR 56, standing 128/60 HR 56  Increase hydration  Reduce inderal  3.  Pap no h/o abnormal no GU blood has not had in a while though typically stop age 15.  Colonoscopy last 11/05/14 diverticulosis/hemorrhoids repeat in 10 years also consider cologuard  Re-Ordered mammo and DEXA today prev ordered 03/2017 and did not go  Never smoker   Had flu shot, pna vx x 2 consider repeat pna 23 it has been 5 years, had Tdap, zoster consider shingrix in future  -prevnar 01/28/17, pna 23 11/09/14 confirmed, Tdap 01/06/13  Reviewed MRI   Provider: Dr. Olivia Mackie McLean-Scocuzza-Internal Medicine

## 2017-07-05 NOTE — Progress Notes (Signed)
Pre visit review using our clinic review tool, if applicable. No additional management support is needed unless otherwise documented below in the visit note. 

## 2017-07-27 ENCOUNTER — Telehealth: Payer: Self-pay | Admitting: Cardiovascular Disease

## 2017-07-27 NOTE — Telephone Encounter (Signed)
Pt states the Flecainide that was prescribed, is not enough tablets each month. Please call to discuss.

## 2017-07-27 NOTE — Telephone Encounter (Signed)
S/w patient. She did not think she had anymore refills of flecainide. We sent in prescription with refills on 06/16/17. Called and s/w patient's pharmacy and they do have the prescription and refills and can fill it. Notified patient that they have the refills and she can get them anytime. She verbalized understanding of this and to call pharmacy next time she is running low.

## 2017-07-28 ENCOUNTER — Ambulatory Visit
Admission: RE | Admit: 2017-07-28 | Discharge: 2017-07-28 | Disposition: A | Discharge status: Home / Self Care | Payer: Medicare Other | Source: Ambulatory Visit | Attending: Internal Medicine | Admitting: Internal Medicine

## 2017-07-28 DIAGNOSIS — Z1231 Encounter for screening mammogram for malignant neoplasm of breast: Secondary | ICD-10-CM | POA: Diagnosis not present

## 2017-07-28 DIAGNOSIS — E2839 Other primary ovarian failure: Secondary | ICD-10-CM

## 2017-07-28 DIAGNOSIS — Z13820 Encounter for screening for osteoporosis: Secondary | ICD-10-CM | POA: Insufficient documentation

## 2017-07-28 DIAGNOSIS — Z780 Asymptomatic menopausal state: Secondary | ICD-10-CM | POA: Diagnosis not present

## 2017-07-28 DIAGNOSIS — M810 Age-related osteoporosis without current pathological fracture: Secondary | ICD-10-CM | POA: Diagnosis not present

## 2017-07-28 DIAGNOSIS — M85852 Other specified disorders of bone density and structure, left thigh: Secondary | ICD-10-CM | POA: Diagnosis not present

## 2017-07-29 ENCOUNTER — Encounter: Payer: Self-pay | Admitting: Internal Medicine

## 2017-07-29 DIAGNOSIS — M810 Age-related osteoporosis without current pathological fracture: Secondary | ICD-10-CM | POA: Insufficient documentation

## 2017-07-30 ENCOUNTER — Telehealth: Payer: Self-pay | Admitting: Internal Medicine

## 2017-07-30 ENCOUNTER — Other Ambulatory Visit: Payer: Self-pay | Admitting: Internal Medicine

## 2017-07-30 NOTE — Telephone Encounter (Signed)
Please try to get patient approved for prolia injections for osteoporosis   [place]

## 2017-09-01 ENCOUNTER — Telehealth: Payer: Self-pay

## 2017-09-01 NOTE — Telephone Encounter (Signed)
FYI

## 2017-09-01 NOTE — Telephone Encounter (Signed)
Copied from [place] [id number]. Topic: Quick Communication - Lab Results >> Jul 29, 2017  2:11 PM Leeanne Rio, CMA wrote: Notes recorded by Leeanne Rio, CMA on 07/29/2017 at 2:10 PM EDT Left message with daughter (not on DPR) for patient to call for results. Okay for [facility] to give results and ask about treatment option. ------  Notes recorded by McLean-Scocuzza, Nino Glow, MD on 07/29/2017 at 1:01 PM EDT Patient has osteoporosis -does she want to try injections for osteoporosis or a pill?  >> Sep 01, 2017 10:15 AM Rutherford Nail, NT wrote: Daughter calling and states that a message was left about whether her mother wanted to do injections or pills. States that her mother would like to do the injections but will not be back in town until after 09/16/17. Please advise. CB#: [phone]

## 2017-10-06 IMAGING — CT CT KNEE*L* W/O CM
2 of 3 series · 8 of 14 positions shown, 9 images · non-contrast
Comparison: Axial images were obtained through the left hip and
thin section images were obtained through the left knee with
multiplanar reformatted images.

CLINICAL DATA: Primary osteoarthritis the left knee. Pre-surgical
planning.

EXAM:
CT OF THE LEFT KNEE WITHOUT CONTRAST
TECHNIQUE: Multidetector CT imaging of the left knee was performed according to
the MY KNEE protocol. Multiplanar CT image reconstructions were also
generated.

[Series 3: axial bone · axial · 0.38mm/px · z∈[+752,+941]mm · 4 of 315 slices shown, 5 images]
[im 63/315  soft-tissue]
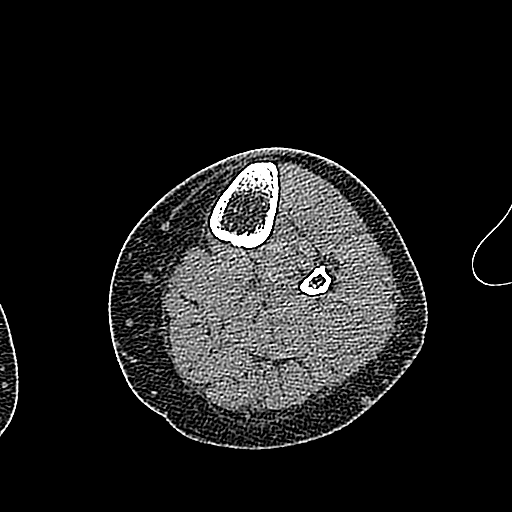
[im 63/315  bone]
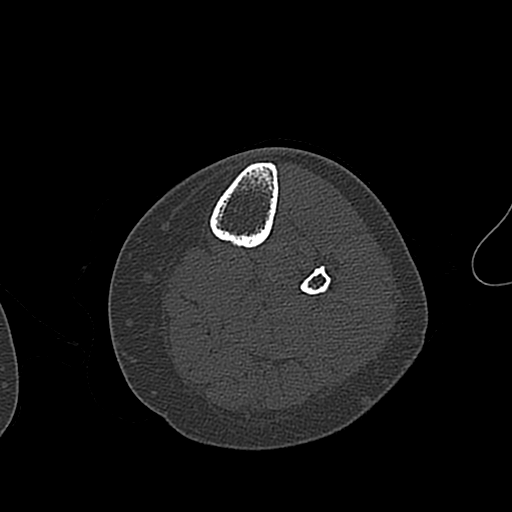
[im 126/315  bone]
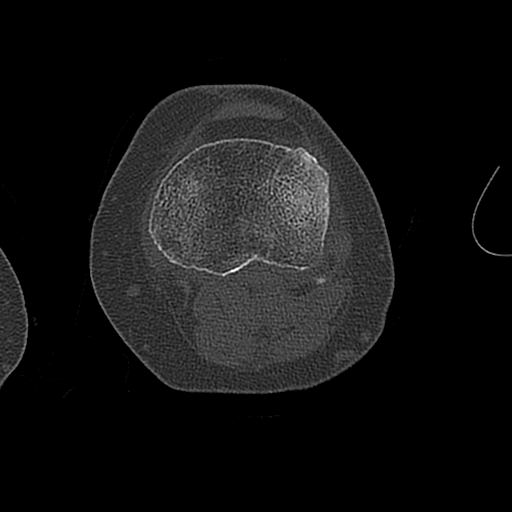
[im 189/315  bone]
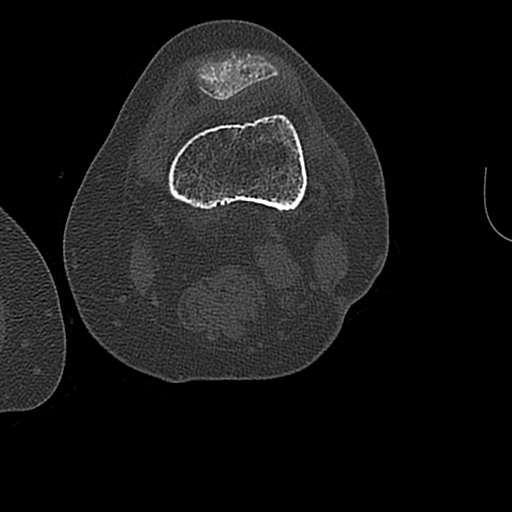
[im 252/315  bone]
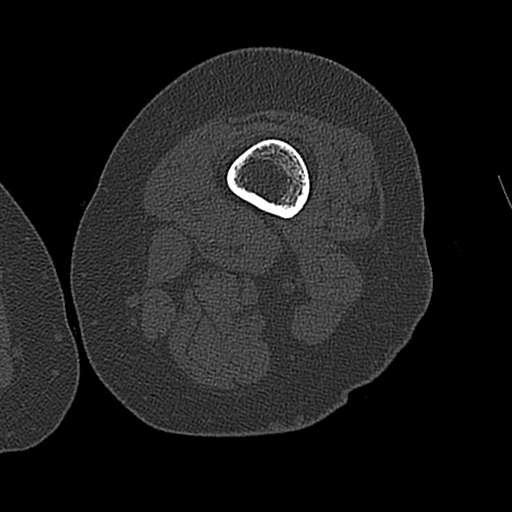

[Series 10: axial soft tissue · axial · 0.30mm/px · z∈[+752,+941]mm · 4 of 316 slices shown]
[im 64/316  soft-tissue]
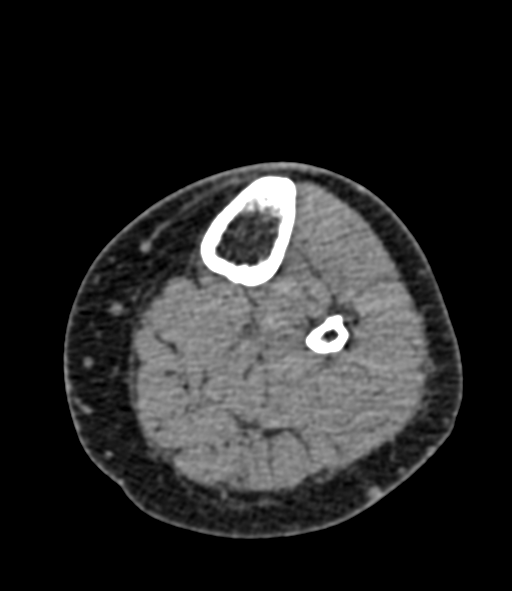
[im 127/316  soft-tissue]
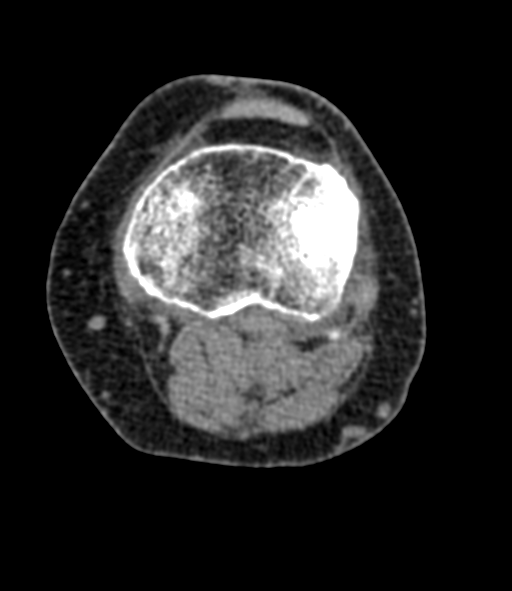
[im 190/316  soft-tissue]
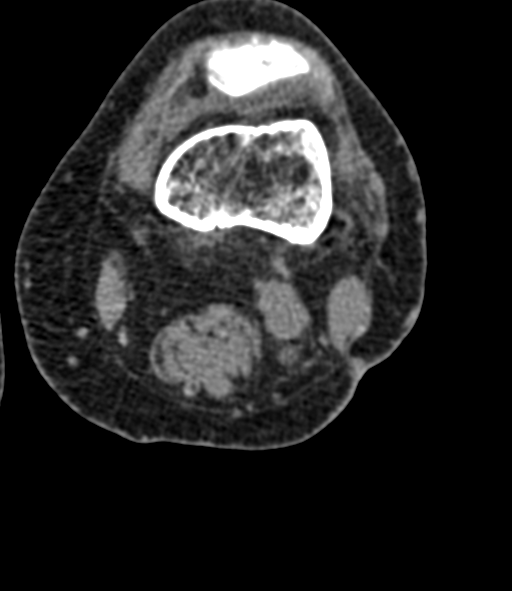
[im 253/316  soft-tissue]
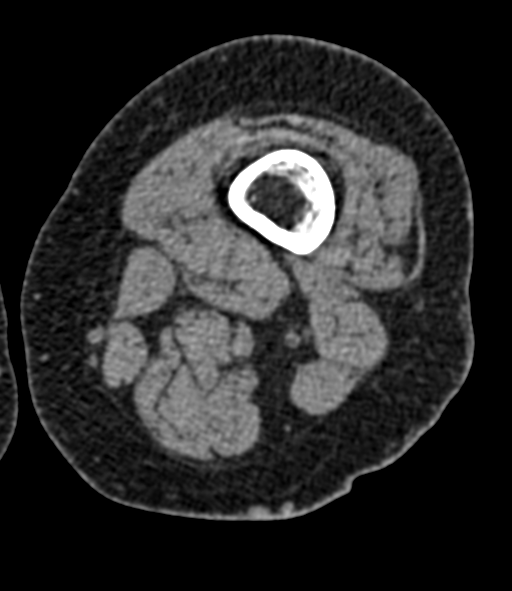

[8 of 14 positions shown; findings below may reference images not displayed]

FINDINGS: There is marked lateral joint space narrowing with a prominent
subcortical cyst in the lateral tibial plateau, 7 mm in diameter.
There is subcortical sclerosis of the lateral tibial plateau. There
is a valgus deformity. Small marginal osteophytes in the lateral
compartment and in the patellofemoral compartment. Small joint
effusion.

Patchy osteopenia in the medial compartment.
IMPRESSION: Osteoarthritis of the left knee primarily in the lateral compartment
with a secondary valgus deformity.

Images the knee and hip are available for pre-surgical planning
measurements.

## 2017-10-12 NOTE — Telephone Encounter (Signed)
When can we get patient scheduled.

## 2017-10-12 NOTE — Telephone Encounter (Signed)
Patient called to schedule injection for osteoporosis.  Dr. Claris Gladden schedule is full into October.  Can you please call patient to have her worked in. CB# [phone].

## 2017-10-18 NOTE — Telephone Encounter (Signed)
Yes look at my note she needs nurse visit scheduled .

## 2017-10-18 NOTE — Telephone Encounter (Signed)
Daughter called - she is going to call back to get mother scheduled, daughter is current in the hospital.

## 2017-10-18 NOTE — Telephone Encounter (Signed)
Injection is just a nurse visit for prolia. Please advise.

## 2017-10-18 NOTE — Telephone Encounter (Signed)
Tried to call patient back to schedule nurse visit. Was unable to leave voicemail.

## 2017-10-18 NOTE — Telephone Encounter (Signed)
Patient has been schedule for 10/26/17.

## 2017-10-18 NOTE — Telephone Encounter (Signed)
Has patient been approved for prolia? According to Dr. Claris Gladden last note she was trying to get patient approved.

## 2017-10-26 ENCOUNTER — Ambulatory Visit (INDEPENDENT_AMBULATORY_CARE_PROVIDER_SITE_OTHER): Payer: Medicare Other

## 2017-10-26 DIAGNOSIS — M810 Age-related osteoporosis without current pathological fracture: Secondary | ICD-10-CM

## 2017-10-26 MED ORDER — DENOSUMAB 60 MG/ML ~~LOC~~ SOSY
60.0000 mg | PREFILLED_SYRINGE | Freq: Once | SUBCUTANEOUS | Status: AC
  Administered 2017-10-26: 60 mg via SUBCUTANEOUS

## 2017-10-26 NOTE — Progress Notes (Addendum)
Patient given prolia injection subcutaneously in the left arm. Patient tolerated well no complaints of discomfort.  Reviewed.  Dr Nicki Reaper

## 2017-11-16 ENCOUNTER — Other Ambulatory Visit: Payer: Self-pay | Admitting: Internal Medicine

## 2017-11-16 ENCOUNTER — Telehealth: Payer: Self-pay | Admitting: Internal Medicine

## 2017-11-16 DIAGNOSIS — F329 Major depressive disorder, single episode, unspecified: Secondary | ICD-10-CM

## 2017-11-16 DIAGNOSIS — F419 Anxiety disorder, unspecified: Secondary | ICD-10-CM

## 2017-11-16 DIAGNOSIS — F4323 Adjustment disorder with mixed anxiety and depressed mood: Secondary | ICD-10-CM | POA: Diagnosis not present

## 2017-11-16 DIAGNOSIS — G4700 Insomnia, unspecified: Secondary | ICD-10-CM

## 2017-11-16 NOTE — Telephone Encounter (Signed)
Insomnia, depression, anxiety since boyfriend Ty moved and and money is stressor - needs med management saw cathy Showfety [phone] phone, [phone] fax  Call to pat to see if established with psychiatry already if not [facility]  [place]

## 2017-11-16 NOTE — Telephone Encounter (Signed)
Please advise 

## 2017-11-16 NOTE — Telephone Encounter (Signed)
Copied from [place] [id number]. Topic: Quick Communication - See Telephone Encounter >> Nov 16, 2017  2:22 PM Ivar Drape wrote: CRM for notification. See Telephone encounter for: 11/16/17. Dr. Rea College Advanced practice psychiatric APRN [phone] would like the provider or nurse to call to collaborate about the patient's medications.

## 2017-11-17 NOTE — Telephone Encounter (Signed)
I rec psychiatry is pt agreeable? Does she have psychiatrist?   [place]

## 2017-11-18 NOTE — Telephone Encounter (Signed)
Patient is ok with referral, she currently has psychologist

## 2017-11-18 NOTE — Telephone Encounter (Signed)
Please refer [facility] psych   Thanks [place]

## 2017-11-30 DIAGNOSIS — F4323 Adjustment disorder with mixed anxiety and depressed mood: Secondary | ICD-10-CM | POA: Diagnosis not present

## 2017-12-03 ENCOUNTER — Ambulatory Visit (INDEPENDENT_AMBULATORY_CARE_PROVIDER_SITE_OTHER): Payer: Medicare Other | Admitting: *Deleted

## 2017-12-03 DIAGNOSIS — Z23 Encounter for immunization: Secondary | ICD-10-CM | POA: Diagnosis not present

## 2017-12-07 DIAGNOSIS — F4323 Adjustment disorder with mixed anxiety and depressed mood: Secondary | ICD-10-CM | POA: Diagnosis not present

## 2017-12-08 ENCOUNTER — Other Ambulatory Visit: Payer: Self-pay

## 2017-12-08 ENCOUNTER — Encounter: Payer: Self-pay | Admitting: Psychiatry

## 2017-12-08 ENCOUNTER — Ambulatory Visit (INDEPENDENT_AMBULATORY_CARE_PROVIDER_SITE_OTHER): Payer: Medicare Other | Admitting: Psychiatry

## 2017-12-08 VITALS — BP 176/74 | HR 60 | Temp 98.2°F | Wt 159.2 lb

## 2017-12-08 DIAGNOSIS — F411 Generalized anxiety disorder: Secondary | ICD-10-CM | POA: Diagnosis not present

## 2017-12-08 DIAGNOSIS — F5105 Insomnia due to other mental disorder: Secondary | ICD-10-CM

## 2017-12-08 DIAGNOSIS — F321 Major depressive disorder, single episode, moderate: Secondary | ICD-10-CM | POA: Diagnosis not present

## 2017-12-08 MED ORDER — MIRTAZAPINE 7.5 MG PO TABS: 7.5000 mg | ORAL_TABLET | Freq: Every day | ORAL | 0 refills | Status: DC

## 2017-12-08 MED ORDER — BUSPIRONE HCL 5 MG PO TABS: 5.0000 mg | ORAL_TABLET | Freq: Two times a day (BID) | ORAL | 0 refills | Status: DC
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
## 2017-12-08 NOTE — Patient Instructions (Signed)
Buspirone tablets What is this medicine? BUSPIRONE (byoo SPYE rone) is used to treat anxiety disorders. This medicine may be used for other purposes; ask your health care provider or pharmacist if you have questions. COMMON BRAND NAME(S): BuSpar What should I tell my health care provider before I take this medicine? They need to know if you have any of these conditions: -kidney or liver disease -an unusual or allergic reaction to buspirone, other medicines, foods, dyes, or preservatives -pregnant or trying to get pregnant -breast-feeding How should I use this medicine? Take this medicine by mouth with a glass of water. Follow the directions on the prescription label. You may take this medicine with or without food. To ensure that this medicine always works the same way for you, you should take it either always with or always without food. Take your doses at regular intervals. Do not take your medicine more often than directed. Do not stop taking except on the advice of your doctor or health care professional. Talk to your pediatrician regarding the use of this medicine in children. Special care may be needed. Overdosage: If you think you have taken too much of this medicine contact a poison control center or emergency room at once. NOTE: This medicine is only for you. Do not share this medicine with others. What if I miss a dose? If you miss a dose, take it as soon as you can. If it is almost time for your next dose, take only that dose. Do not take double or extra doses. What may interact with this medicine? Do not take this medicine with any of the following medications: -linezolid -MAOIs like Carbex, Eldepryl, Marplan, Nardil, and Parnate -methylene blue -procarbazine This medicine may also interact with the following medications: -diazepam -digoxin -diltiazem -erythromycin -grapefruit juice -haloperidol -medicines for mental depression or mood problems -medicines for seizures like  carbamazepine, phenobarbital and phenytoin -nefazodone -other medications for anxiety -rifampin -ritonavir -some antifungal medicines like itraconazole, ketoconazole, and voriconazole -verapamil -warfarin This list may not describe all possible interactions. Give your health care provider a list of all the medicines, herbs, non-prescription drugs, or dietary supplements you use. Also tell them if you smoke, drink alcohol, or use illegal drugs. Some items may interact with your medicine. What should I watch for while using this medicine? Visit your doctor or health care professional for regular checks on your progress. It may take 1 to 2 weeks before your anxiety gets better. You may get drowsy or dizzy. Do not drive, use machinery, or do anything that needs mental alertness until you know how this drug affects you. Do not stand or sit up quickly, especially if you are an older patient. This reduces the risk of dizzy or fainting spells. Alcohol can make you more drowsy and dizzy. Avoid alcoholic drinks. What side effects may I notice from receiving this medicine? Side effects that you should report to your doctor or health care professional as soon as possible: -blurred vision or other vision changes -chest pain -confusion -difficulty breathing -feelings of hostility or anger -muscle aches and pains -numbness or tingling in hands or feet -ringing in the ears -skin rash and itching -vomiting -weakness Side effects that usually do not require medical attention (report to your doctor or health care professional if they continue or are bothersome): -disturbed dreams, nightmares -headache -nausea -restlessness or nervousness -sore throat and nasal congestion -stomach upset This list may not describe all possible side effects. Call your doctor for medical advice about side   effects. You may report side effects to FDA at 1-800-FDA-1088. Where should I keep my medicine? Keep out of the reach  of children. Store at room temperature below 30 degrees C (86 degrees F). Protect from light. Keep container tightly closed. Throw away any unused medicine after the expiration date. NOTE: This sheet is a summary. It may not cover all possible information. If you have questions about this medicine, talk to your doctor, pharmacist, or health care provider.  2018 Elsevier/Gold Standard (2009-10-10 18:06:11) Mirtazapine tablets What is this medicine? MIRTAZAPINE (mir TAZ a peen) is used to treat depression. This medicine may be used for other purposes; ask your health care provider or pharmacist if you have questions. COMMON BRAND NAME(S): Remeron What should I tell my health care provider before I take this medicine? They need to know if you have any of these conditions: -bipolar disorder -glaucoma -kidney disease -liver disease -suicidal thoughts -an unusual or allergic reaction to mirtazapine, other medicines, foods, dyes, or preservatives -pregnant or trying to get pregnant -breast-feeding How should I use this medicine? Take this medicine by mouth with a glass of water. Follow the directions on the prescription label. Take your medicine at regular intervals. Do not take your medicine more often than directed. Do not stop taking this medicine suddenly except upon the advice of your doctor. Stopping this medicine too quickly may cause serious side effects or your condition may worsen. A special MedGuide will be given to you by the pharmacist with each prescription and refill. Be sure to read this information carefully each time. Talk to your pediatrician regarding the use of this medicine in children. Special care may be needed. Overdosage: If you think you have taken too much of this medicine contact a poison control center or emergency room at once. NOTE: This medicine is only for you. Do not share this medicine with others. What if I miss a dose? If you miss a dose, take it as soon as  you can. If it is almost time for your next dose, take only that dose. Do not take double or extra doses. What may interact with this medicine? Do not take this medicine with any of the following medications: -linezolid -MAOIs like Carbex, Eldepryl, Marplan, Nardil, and Parnate -methylene blue (injected into a vein) This medicine may also interact with the following medications: -alcohol -antiviral medicines for HIV or AIDS -certain medicines that treat or prevent blood clots like warfarin -certain medicines for depression, anxiety, or psychotic disturbances -certain medicines for fungal infections like ketoconazole and itraconazole -certain medicines for migraine headache like almotriptan, eletriptan, frovatriptan, naratriptan, rizatriptan, sumatriptan, zolmitriptan -certain medicines for seizures like carbamazepine or phenytoin -certain medicines for sleep -cimetidine -erythromycin -fentanyl -lithium -medicines for blood pressure -nefazodone -rasagiline -rifampin -supplements like St. John's wort, kava kava, valerian -tramadol -tryptophan This list may not describe all possible interactions. Give your health care provider a list of all the medicines, herbs, non-prescription drugs, or dietary supplements you use. Also tell them if you smoke, drink alcohol, or use illegal drugs. Some items may interact with your medicine. What should I watch for while using this medicine? Tell your doctor if your symptoms do not get better or if they get worse. Visit your doctor or health care professional for regular checks on your progress. Because it may take several weeks to see the full effects of this medicine, it is important to continue your treatment as prescribed by your doctor. Patients and their families should watch out for  new or worsening thoughts of suicide or depression. Also watch out for sudden changes in feelings such as feeling anxious, agitated, panicky, irritable, hostile,  aggressive, impulsive, severely restless, overly excited and hyperactive, or not being able to sleep. If this happens, especially at the beginning of treatment or after a change in dose, call your health care professional. Dennis Bast may get drowsy or dizzy. Do not drive, use machinery, or do anything that needs mental alertness until you know how this medicine affects you. Do not stand or sit up quickly, especially if you are an older patient. This reduces the risk of dizzy or fainting spells. Alcohol may interfere with the effect of this medicine. Avoid alcoholic drinks. This medicine may cause dry eyes and blurred vision. If you wear contact lenses you may feel some discomfort. Lubricating drops may help. See your eye doctor if the problem does not go away or is severe. Your mouth may get dry. Chewing sugarless gum or sucking hard candy, and drinking plenty of water may help. Contact your doctor if the problem does not go away or is severe. What side effects may I notice from receiving this medicine? Side effects that you should report to your doctor or health care professional as soon as possible: -allergic reactions like skin rash, itching or hives, swelling of the face, lips, or tongue -anxious -changes in vision -chest pain -confusion -elevated mood, decreased need for sleep, racing thoughts, impulsive behavior -eye pain -fast, irregular heartbeat -feeling faint or lightheaded, falls -feeling agitated, angry, or irritable -fever or chills, sore throat -hallucination, loss of contact with reality -loss of balance or coordination -mouth sores -redness, blistering, peeling or loosening of the skin, including inside the mouth -restlessness, pacing, inability to keep still -seizures -stiff muscles -suicidal thoughts or other mood changes -trouble passing urine or change in the amount of urine -trouble sleeping -unusual bleeding or bruising -unusually weak or tired -vomiting Side effects that  usually do not require medical attention (report to your doctor or health care professional if they continue or are bothersome): -change in appetite -constipation -dizziness -dry mouth -muscle aches or pains -nausea -tired -weight gain This list may not describe all possible side effects. Call your doctor for medical advice about side effects. You may report side effects to FDA at 1-800-FDA-1088. Where should I keep my medicine? Keep out of the reach of children. Store at room temperature between 15 and 30 degrees C (59 and 86 degrees F) Protect from light and moisture. Throw away any unused medicine after the expiration date. NOTE: This sheet is a summary. It may not cover all possible information. If you have questions about this medicine, talk to your doctor, pharmacist, or health care provider.  2018 Elsevier/Gold Standard (2015-08-01 17:30:45)

## 2017-12-22 ENCOUNTER — Ambulatory Visit (INDEPENDENT_AMBULATORY_CARE_PROVIDER_SITE_OTHER): Payer: Medicare Other | Admitting: Psychiatry

## 2017-12-22 ENCOUNTER — Other Ambulatory Visit: Payer: Self-pay

## 2017-12-22 ENCOUNTER — Encounter: Payer: Self-pay | Admitting: Psychiatry

## 2017-12-22 VITALS — BP 150/75 | HR 66 | Temp 98.1°F | Wt 161.6 lb

## 2017-12-22 DIAGNOSIS — F321 Major depressive disorder, single episode, moderate: Secondary | ICD-10-CM | POA: Diagnosis not present

## 2017-12-22 DIAGNOSIS — F411 Generalized anxiety disorder: Secondary | ICD-10-CM | POA: Diagnosis not present

## 2017-12-22 DIAGNOSIS — F5105 Insomnia due to other mental disorder: Secondary | ICD-10-CM | POA: Diagnosis not present

## 2017-12-22 MED ORDER — MIRTAZAPINE 15 MG PO TABS: 22.5000 mg | ORAL_TABLET | Freq: Every day | ORAL | 0 refills | Status: DC
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
## 2017-12-22 NOTE — Patient Instructions (Signed)
Start taking Remeron or Mirtazapine 22.5 mg at bedtime for 1 week. Return in 1 week. If you are too sleepy or have side effects to the new dosage of Remeron, then stop taking the new dose and go back to your previous dosage.

## 2017-12-23 ENCOUNTER — Encounter: Payer: Self-pay | Admitting: Psychiatry

## 2017-12-29 ENCOUNTER — Encounter: Payer: Self-pay | Admitting: Psychiatry

## 2017-12-29 ENCOUNTER — Ambulatory Visit (INDEPENDENT_AMBULATORY_CARE_PROVIDER_SITE_OTHER): Payer: Medicare Other | Admitting: Psychiatry

## 2017-12-29 ENCOUNTER — Other Ambulatory Visit: Payer: Self-pay

## 2017-12-29 VITALS — BP 161/71 | HR 67 | Temp 97.7°F | Wt 163.4 lb

## 2017-12-29 DIAGNOSIS — F5105 Insomnia due to other mental disorder: Secondary | ICD-10-CM | POA: Diagnosis not present

## 2017-12-29 DIAGNOSIS — F411 Generalized anxiety disorder: Secondary | ICD-10-CM | POA: Diagnosis not present

## 2017-12-29 DIAGNOSIS — F321 Major depressive disorder, single episode, moderate: Secondary | ICD-10-CM

## 2017-12-29 MED ORDER — MIRTAZAPINE 15 MG PO TABS: 22.5000 mg | ORAL_TABLET | Freq: Every day | ORAL | 1 refills | Status: DC

## 2017-12-29 MED ORDER — BUSPIRONE HCL 5 MG PO TABS: 5.0000 mg | ORAL_TABLET | Freq: Two times a day (BID) | ORAL | 1 refills | Status: DC
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
## 2017-12-30 DIAGNOSIS — M542 Cervicalgia: Secondary | ICD-10-CM | POA: Diagnosis not present

## 2017-12-30 DIAGNOSIS — M545 Low back pain: Secondary | ICD-10-CM | POA: Diagnosis not present

## 2017-12-30 DIAGNOSIS — G8929 Other chronic pain: Secondary | ICD-10-CM | POA: Diagnosis not present

## 2018-01-17 ENCOUNTER — Telehealth: Payer: Self-pay | Admitting: Internal Medicine

## 2018-01-17 NOTE — Telephone Encounter (Signed)
Attempted to call patient to schedule AWV. Patient did not answer. No voicemail set up; could not leave message. SF

## 2018-01-27 ENCOUNTER — Encounter: Payer: Self-pay | Admitting: Psychiatry

## 2018-01-27 ENCOUNTER — Other Ambulatory Visit: Payer: Self-pay

## 2018-01-27 ENCOUNTER — Ambulatory Visit (INDEPENDENT_AMBULATORY_CARE_PROVIDER_SITE_OTHER): Payer: Medicare Other | Admitting: Psychiatry

## 2018-01-27 VITALS — BP 179/72 | HR 68 | Temp 97.5°F | Wt 171.0 lb

## 2018-01-27 DIAGNOSIS — F5105 Insomnia due to other mental disorder: Secondary | ICD-10-CM | POA: Diagnosis not present

## 2018-01-27 DIAGNOSIS — F411 Generalized anxiety disorder: Secondary | ICD-10-CM | POA: Diagnosis not present

## 2018-01-27 DIAGNOSIS — F321 Major depressive disorder, single episode, moderate: Secondary | ICD-10-CM | POA: Diagnosis not present

## 2018-01-27 MED ORDER — RAMELTEON 8 MG PO TABS: 8.0000 mg | ORAL_TABLET | Freq: Every day | ORAL | 1 refills | Status: DC

## 2018-01-27 MED ORDER — BUSPIRONE HCL 7.5 MG PO TABS: 7.5000 mg | ORAL_TABLET | Freq: Two times a day (BID) | ORAL | 1 refills | Status: DC
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
## 2018-01-27 NOTE — Patient Instructions (Signed)
Ramelteon tablets  What is this medicine?  RAMELTEON (ram EL tee on) is used to treat insomnia. This medicine helps you to fall asleep.  This medicine may be used for other purposes; ask your health care provider or pharmacist if you have questions.  COMMON BRAND NAME(S): Rozerem  What should I tell my health care provider before I take this medicine?  They need to know if you have any of these conditions:  -depression  -history of a drug or alcohol abuse problem  -liver disease  -lung or breathing disease  -suicidal thoughts  -an unusual or allergic reaction to ramelteon, other medicines, foods, dyes, or preservatives  -pregnant or trying to get pregnant  -breast-feeding  How should I use this medicine?  Take this medicine by mouth with a glass of water. Do not break tablets; swallow whole. Follow the directions on the prescription label. It is better to take this medicine on an empty stomach and only when you are ready for bed. Do not take your medicine more often than directed.  A special MedGuide will be given to you by the pharmacist with each prescription and refill. Be sure to read this information carefully each time.  Talk to your pediatrician regarding the use of this medicine in children. Special care may be needed.  Overdosage: If you think you have taken too much of this medicine contact a poison control center or emergency room at once.  NOTE: This medicine is only for you. Do not share this medicine with others.  What if I miss a dose?  This does not apply. This medicine should only be taken immediately before going to sleep. Do not take double or extra doses.  What may interact with this medicine?  Do not take this medicine with any of the following medications:  -fluvoxamine  -melatonin  This medicine may also interact with the following medications:  -medicines used to treat fungal infections like ketoconazole, fluconazole, or itraconazole  -rifampin  This list may not describe all possible  interactions. Give your health care provider a list of all the medicines, herbs, non-prescription drugs, or dietary supplements you use. Also tell them if you smoke, drink alcohol, or use illegal drugs. Some items may interact with your medicine.  What should I watch for while using this medicine?  Visit your doctor or health care professional for regular checks on your progress. Keep a regular sleep schedule by going to bed at about the same time each night. Avoid caffeine-containing drinks in the evening hours. Talk to your doctor if you still have trouble sleeping within 7 to 10 days of using this medicine. This may mean there is another cause for your sleep problems.  After taking this medicine for sleep, you may get up out of bed while not being fully awake and do an activity that you do not know you are doing. The next morning, you may have no memory of the event. Activities such as driving a car ("sleep-driving"), making and eating food, talking on the phone, sexual activity, and sleep-walking have been reported. Call your doctor right away if you find out you have done any of these activities. Do not take this medicine if you drink alcohol or have taken another medicine for sleep, since your risk of doing these sleep-related activities will be increased.  Do not take this medicine unless you are able to stay in bed for a full night (7 to 8 hours) before you must be active again. You   may have a decrease in mental alertness the day after use, even if you feel that you are fully awake. Tell your doctor if you will need to perform activities requiring full alertness, such as driving, the next day. Do not stand or sit up quickly after taking this medicine, especially if you are an older patient. This reduces the risk of dizzy or fainting spells.  If you or your family notice any changes in your behavior, such as new or worsening depression, thoughts of harming yourself, anxiety, other unusual or disturbing  thoughts, or memory loss, call your doctor right away.  What side effects may I notice from receiving this medicine?  Side effects that you should report to your doctor or health care professional as soon as possible:  -allergic reactions like skin rash, itching or hives, swelling of the face, lips, or tongue  -breast milk production or discharge  -breathing problems  -joint or muscle pain  -depression, suicidal thoughts  -missed monthly period (for women)  -unusual activities while asleep like driving, eating, making phone calls  -unusually weak or tired  -worsening of insomnia  Side effects that usually do not require medical attention (report to your doctor or health care professional if they continue or are bothersome):  -bad taste  -daytime sleepiness  -decreased sex drive  -diarrhea  -headache  -nausea  This list may not describe all possible side effects. Call your doctor for medical advice about side effects. You may report side effects to FDA at 1-800-FDA-1088.  Where should I keep my medicine?  Keep out of the reach of children.  Store at room temperature between 15 and 30 degrees C (59 and 86 degrees F). Keep the container tightly closed. Protect from moisture and humidity. Throw away any unused medicine after the expiration date.  NOTE: This sheet is a summary. It may not cover all possible information. If you have questions about this medicine, talk to your doctor, pharmacist, or health care provider.   2018 Elsevier/Gold Standard (2013-10-31 18:21:29)

## 2018-02-01 DIAGNOSIS — F4323 Adjustment disorder with mixed anxiety and depressed mood: Secondary | ICD-10-CM | POA: Diagnosis not present

## 2018-02-02 ENCOUNTER — Telehealth: Payer: Self-pay | Admitting: Psychiatry

## 2018-02-02 ENCOUNTER — Telehealth: Payer: Self-pay

## 2018-02-02 MED ORDER — ESZOPICLONE 1 MG PO TABS: 1.0000 mg | ORAL_TABLET | Freq: Every evening | ORAL | 1 refills | Status: DC | PRN

## 2018-02-02 NOTE — Telephone Encounter (Signed)
Tried calling patient - unavailable

## 2018-02-02 NOTE — Telephone Encounter (Signed)
Pt cannot afford Rozerem. Will send Lunesta 1 mg to pharmacy

## 2018-02-02 NOTE — Telephone Encounter (Signed)
pt called states that the rozerem is ove $400 and she can not afford that

## 2018-02-07 ENCOUNTER — Telehealth: Payer: Self-pay | Admitting: Psychiatry

## 2018-02-07 MED ORDER — ZOLPIDEM TARTRATE 5 MG PO TABS: 5.0000 mg | ORAL_TABLET | Freq: Every evening | ORAL | 0 refills | Status: DC | PRN

## 2018-02-07 NOTE — Telephone Encounter (Signed)
Gina Frazier is not on formulary. Will send Ambien to pharmacy. Called pt to discuss, she agrees with plan.

## 2018-02-24 ENCOUNTER — Ambulatory Visit (INDEPENDENT_AMBULATORY_CARE_PROVIDER_SITE_OTHER): Payer: Medicare Other | Admitting: Psychiatry

## 2018-02-24 ENCOUNTER — Other Ambulatory Visit: Payer: Self-pay

## 2018-02-24 ENCOUNTER — Encounter: Payer: Self-pay | Admitting: Psychiatry

## 2018-02-24 VITALS — BP 175/67 | HR 58 | Temp 97.7°F | Wt 165.6 lb

## 2018-02-24 DIAGNOSIS — F411 Generalized anxiety disorder: Secondary | ICD-10-CM

## 2018-02-24 DIAGNOSIS — F321 Major depressive disorder, single episode, moderate: Secondary | ICD-10-CM

## 2018-02-24 DIAGNOSIS — F5105 Insomnia due to other mental disorder: Secondary | ICD-10-CM | POA: Diagnosis not present

## 2018-02-24 MED ORDER — BUSPIRONE HCL 7.5 MG PO TABS: 7.5000 mg | ORAL_TABLET | Freq: Two times a day (BID) | ORAL | 1 refills | Status: DC

## 2018-02-24 MED ORDER — ZOLPIDEM TARTRATE 5 MG PO TABS: 5.0000 mg | ORAL_TABLET | Freq: Every evening | ORAL | 0 refills | Status: DC | PRN
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
## 2018-03-01 DIAGNOSIS — F4323 Adjustment disorder with mixed anxiety and depressed mood: Secondary | ICD-10-CM | POA: Diagnosis not present

## 2018-03-30 DIAGNOSIS — F4323 Adjustment disorder with mixed anxiety and depressed mood: Secondary | ICD-10-CM | POA: Diagnosis not present

## 2018-04-05 ENCOUNTER — Telehealth: Payer: Self-pay | Admitting: Cardiovascular Disease

## 2018-04-05 NOTE — Telephone Encounter (Signed)
Spoke with patient and she states that she went to pick up her medication and it was going to cost $500.00 and she was not able to pay that. She will run out on Monday and wanted to know what she could do. Advised that once she meets her deductible it should then be covered by her insurance and that I could leave some samples up front for her to assist until she meets that deductible. She verbalized understanding with no further questions at this time.  Medication Samples have been provided to the patient.  Drug name: Eliquis       Strength: 5 mg        Qty: 4 Boxes  LOT: [id number]  Exp.Date: 6/22

## 2018-04-05 NOTE — Telephone Encounter (Signed)
Pt c/o medication issue:  1. Name of Medication: Eliquis   2. How are you currently taking this medication (dosage and times per day)?  5 mg po BID   3. Are you having a reaction (difficulty breathing--STAT)?  No   4. What is your medication issue? Cannot afford it will be $500 this year

## 2018-04-21 ENCOUNTER — Telehealth: Payer: Self-pay | Admitting: Internal Medicine

## 2018-04-21 NOTE — Telephone Encounter (Signed)
Pharmacist, community for [facility] on [address]. Medication approved needs appointment.

## 2018-04-27 ENCOUNTER — Ambulatory Visit (INDEPENDENT_AMBULATORY_CARE_PROVIDER_SITE_OTHER): Payer: Medicare Other | Admitting: Psychiatry

## 2018-04-27 ENCOUNTER — Encounter: Payer: Self-pay | Admitting: Psychiatry

## 2018-04-27 ENCOUNTER — Other Ambulatory Visit: Payer: Self-pay

## 2018-04-27 VITALS — BP 165/72 | HR 71 | Temp 97.9°F | Wt 161.6 lb

## 2018-04-27 DIAGNOSIS — F5105 Insomnia due to other mental disorder: Secondary | ICD-10-CM

## 2018-04-27 DIAGNOSIS — F411 Generalized anxiety disorder: Secondary | ICD-10-CM

## 2018-04-27 DIAGNOSIS — F321 Major depressive disorder, single episode, moderate: Secondary | ICD-10-CM | POA: Diagnosis not present

## 2018-04-27 DIAGNOSIS — R030 Elevated blood-pressure reading, without diagnosis of hypertension: Secondary | ICD-10-CM | POA: Diagnosis not present
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
## 2018-04-28 ENCOUNTER — Ambulatory Visit (INDEPENDENT_AMBULATORY_CARE_PROVIDER_SITE_OTHER): Payer: Medicare Other

## 2018-04-28 DIAGNOSIS — M810 Age-related osteoporosis without current pathological fracture: Secondary | ICD-10-CM

## 2018-04-28 MED ORDER — DENOSUMAB 60 MG/ML ~~LOC~~ SOSY
60.0000 mg | PREFILLED_SYRINGE | Freq: Once | SUBCUTANEOUS | Status: AC
  Administered 2018-04-28: 60 mg via SUBCUTANEOUS

## 2018-04-28 NOTE — Progress Notes (Signed)
Patient came in today for prolia injection in left arm. Patient tolerated well.

## 2018-04-29 ENCOUNTER — Ambulatory Visit (INDEPENDENT_AMBULATORY_CARE_PROVIDER_SITE_OTHER): Payer: Medicare Other | Admitting: Internal Medicine

## 2018-04-29 ENCOUNTER — Other Ambulatory Visit: Payer: Self-pay | Admitting: Internal Medicine

## 2018-04-29 ENCOUNTER — Encounter: Payer: Self-pay | Admitting: Internal Medicine

## 2018-04-29 ENCOUNTER — Ambulatory Visit (INDEPENDENT_AMBULATORY_CARE_PROVIDER_SITE_OTHER): Payer: Medicare Other

## 2018-04-29 VITALS — BP 126/80 | HR 59 | Temp 98.3°F | Ht 62.0 in | Wt 161.1 lb

## 2018-04-29 DIAGNOSIS — I480 Paroxysmal atrial fibrillation: Secondary | ICD-10-CM

## 2018-04-29 DIAGNOSIS — Z471 Aftercare following joint replacement surgery: Secondary | ICD-10-CM | POA: Diagnosis not present

## 2018-04-29 DIAGNOSIS — M25561 Pain in right knee: Secondary | ICD-10-CM

## 2018-04-29 DIAGNOSIS — R946 Abnormal results of thyroid function studies: Secondary | ICD-10-CM

## 2018-04-29 DIAGNOSIS — Z1329 Encounter for screening for other suspected endocrine disorder: Secondary | ICD-10-CM | POA: Diagnosis not present

## 2018-04-29 DIAGNOSIS — Z1389 Encounter for screening for other disorder: Secondary | ICD-10-CM | POA: Diagnosis not present

## 2018-04-29 DIAGNOSIS — I10 Essential (primary) hypertension: Secondary | ICD-10-CM

## 2018-04-29 DIAGNOSIS — R7989 Other specified abnormal findings of blood chemistry: Secondary | ICD-10-CM

## 2018-04-29 DIAGNOSIS — Z96651 Presence of right artificial knee joint: Secondary | ICD-10-CM | POA: Diagnosis not present

## 2018-04-29 LAB — LIPID PANEL
Cholesterol: 179 mg/dL (ref 0–200)
HDL: 62.9 mg/dL (ref >39.00)
LDL Cholesterol: 106 mg/dL — ABNORMAL HIGH (ref 0–99)
NonHDL: 116.18
Total CHOL/HDL Ratio: 3
Triglycerides: 49 mg/dL (ref 0.0–149.0)
VLDL: 9.8 mg/dL (ref 0.0–40.0)

## 2018-04-29 LAB — COMPREHENSIVE METABOLIC PANEL
ALT: 7 U/L (ref 0–35)
AST: 15 U/L (ref 0–37)
Albumin: 3.9 g/dL (ref 3.5–5.2)
Alkaline Phosphatase: 78 U/L (ref 39–117)
BUN: 20 mg/dL (ref 6–23)
CO2: 32 mEq/L (ref 19–32)
Calcium: 8.9 mg/dL (ref 8.4–10.5)
Chloride: 102 mEq/L (ref 96–112)
Creatinine, Ser: 0.85 mg/dL (ref 0.40–1.20)
GFR: 65.25 mL/min (ref >60.00)
Glucose, Bld: 85 mg/dL (ref 70–99)
Potassium: 4.3 mEq/L (ref 3.5–5.1)
Sodium: 141 mEq/L (ref 135–145)
Total Bilirubin: 0.6 mg/dL (ref 0.2–1.2)
Total Protein: 6.7 g/dL (ref 6.0–8.3)

## 2018-04-29 LAB — URINALYSIS, ROUTINE W REFLEX MICROSCOPIC
Bilirubin Urine: NEGATIVE
Glucose, UA: NEGATIVE
Hgb urine dipstick: NEGATIVE
Ketones, ur: NEGATIVE
Leukocytes,Ua: NEGATIVE
Nitrite: NEGATIVE
Protein, ur: NEGATIVE
Specific Gravity, Urine: 1.01 (ref 1.001–1.03)
pH: 6.5 (ref 5.0–8.0)

## 2018-04-29 LAB — CBC WITH DIFFERENTIAL/PLATELET
BASOS ABS: 0 10*3/uL (ref 0.0–0.1)
Basophils Relative: 0.9 % (ref 0.0–3.0)
Eosinophils Absolute: 0.2 10*3/uL (ref 0.0–0.7)
Eosinophils Relative: 3.7 % (ref 0.0–5.0)
HCT: 39.6 % (ref 36.0–46.0)
HEMOGLOBIN: 13 g/dL (ref 12.0–15.0)
Lymphocytes Relative: 28.2 % (ref 12.0–46.0)
Lymphs Abs: 1.5 10*3/uL (ref 0.7–4.0)
MCHC: 32.8 g/dL (ref 30.0–36.0)
MCV: 91 fl (ref 78.0–100.0)
Monocytes Absolute: 0.4 10*3/uL (ref 0.1–1.0)
Monocytes Relative: 7.7 % (ref 3.0–12.0)
Neutro Abs: 3.2 10*3/uL (ref 1.4–7.7)
Neutrophils Relative %: 59.5 % (ref 43.0–77.0)
Platelets: 186 10*3/uL (ref 150.0–400.0)
RBC: 4.35 Mil/uL (ref 3.87–5.11)
RDW: 13.5 % (ref 11.5–15.5)
WBC: 5.4 10*3/uL (ref 4.0–10.5)

## 2018-04-29 LAB — TSH: TSH: 5.11 u[IU]/mL — ABNORMAL HIGH (ref 0.35–4.50)

## 2018-04-29 MED ORDER — LOSARTAN POTASSIUM 25 MG PO TABS: 25.0000 mg | ORAL_TABLET | Freq: Every day | ORAL | 3 refills | Status: DC

## 2018-04-29 NOTE — Progress Notes (Signed)
Pre visit review using our clinic review tool, if applicable. No additional management support is needed unless otherwise documented below in the visit note. 
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
## 2018-04-29 NOTE — Patient Instructions (Addendum)
Look for coupon for Eliquis, Flecainide  Take losartan at night 25 mg write blood pressure readings down and if blood pressure not <130/<80 let me know    Hypertension Hypertension, commonly called high blood pressure, is when the force of blood pumping through the arteries is too strong. The arteries are the blood vessels that carry blood from the heart throughout the body. Hypertension forces the heart to work harder to pump blood and may cause arteries to become narrow or stiff. Having untreated or uncontrolled hypertension can cause heart attacks, strokes, kidney disease, and other problems. A blood pressure reading consists of a higher number over a lower number. Ideally, your blood pressure should be below 120/80. The first ("top") number is called the systolic pressure. It is a measure of the pressure in your arteries as your heart beats. The second ("bottom") number is called the diastolic pressure. It is a measure of the pressure in your arteries as the heart relaxes. What are the causes? The cause of this condition is not known. What increases the risk? Some risk factors for high blood pressure are under your control. Others are not. Factors you can change  Smoking.  Having type 2 diabetes mellitus, high cholesterol, or both.  Not getting enough exercise or physical activity.  Being overweight.  Having too much fat, sugar, calories, or salt (sodium) in your diet.  Drinking too much alcohol. Factors that are difficult or impossible to change  Having chronic kidney disease.  Having a family history of high blood pressure.  Age. Risk increases with age.  Race. You may be at higher risk if you are African-American.  Gender. Men are at higher risk than women before age 64. After age 55, women are at higher risk than men.  Having obstructive sleep apnea.  Stress. What are the signs or symptoms? Extremely high blood pressure (hypertensive crisis) may  cause:  Headache.  Anxiety.  Shortness of breath.  Nosebleed.  Nausea and vomiting.  Severe chest pain.  Jerky movements you cannot control (seizures). How is this diagnosed? This condition is diagnosed by measuring your blood pressure while you are seated, with your arm resting on a surface. The cuff of the blood pressure monitor will be placed directly against the skin of your upper arm at the level of your heart. It should be measured at least twice using the same arm. Certain conditions can cause a difference in blood pressure between your right and left arms. Certain factors can cause blood pressure readings to be lower or higher than normal (elevated) for a short period of time:  When your blood pressure is higher when you are in a health care provider's office than when you are at home, this is called white coat hypertension. Most people with this condition do not need medicines.  When your blood pressure is higher at home than when you are in a health care provider's office, this is called masked hypertension. Most people with this condition may need medicines to control blood pressure. If you have a high blood pressure reading during one visit or you have normal blood pressure with other risk factors:  You may be asked to return on a different day to have your blood pressure checked again.  You may be asked to monitor your blood pressure at home for 1 week or longer. If you are diagnosed with hypertension, you may have other blood or imaging tests to help your health care provider understand your overall risk for other  conditions. How is this treated? This condition is treated by making healthy lifestyle changes, such as eating healthy foods, exercising more, and reducing your alcohol intake. Your health care provider may prescribe medicine if lifestyle changes are not enough to get your blood pressure under control, and if:  Your systolic blood pressure is above 130.  Your  diastolic blood pressure is above 80. Your personal target blood pressure may vary depending on your medical conditions, your age, and other factors. Follow these instructions at home: Eating and drinking   Eat a diet that is high in fiber and potassium, and low in sodium, added sugar, and fat. An example eating plan is called the DASH (Dietary Approaches to Stop Hypertension) diet. To eat this way: ? Eat plenty of fresh fruits and vegetables. Try to fill half of your plate at each meal with fruits and vegetables. ? Eat whole grains, such as whole wheat pasta, brown rice, or whole grain bread. Fill about one quarter of your plate with whole grains. ? Eat or drink low-fat dairy products, such as skim milk or low-fat yogurt. ? Avoid fatty cuts of meat, processed or cured meats, and poultry with skin. Fill about one quarter of your plate with lean proteins, such as fish, chicken without skin, beans, eggs, and tofu. ? Avoid premade and processed foods. These tend to be higher in sodium, added sugar, and fat.  Reduce your daily sodium intake. Most people with hypertension should eat less than 1,500 mg of sodium a day.  Limit alcohol intake to no more than 1 drink a day for nonpregnant women and 2 drinks a day for men. One drink equals 12 oz of beer, 5 oz of wine, or 1 oz of hard liquor. Lifestyle   Work with your health care provider to maintain a healthy body weight or to lose weight. Ask what an ideal weight is for you.  Get at least 30 minutes of exercise that causes your heart to beat faster (aerobic exercise) most days of the week. Activities may include walking, swimming, or biking.  Include exercise to strengthen your muscles (resistance exercise), such as pilates or lifting weights, as part of your weekly exercise routine. Try to do these types of exercises for 30 minutes at least 3 days a week.  Do not use any products that contain nicotine or tobacco, such as cigarettes and  e-cigarettes. If you need help quitting, ask your health care provider.  Monitor your blood pressure at home as told by your health care provider.  Keep all follow-up visits as told by your health care provider. This is important. Medicines  Take over-the-counter and prescription medicines only as told by your health care provider. Follow directions carefully. Blood pressure medicines must be taken as prescribed.  Do not skip doses of blood pressure medicine. Doing this puts you at risk for problems and can make the medicine less effective.  Ask your health care provider about side effects or reactions to medicines that you should watch for. Contact a health care provider if:  You think you are having a reaction to a medicine you are taking.  You have headaches that keep coming back (recurring).  You feel dizzy.  You have swelling in your ankles.  You have trouble with your vision. Get help right away if:  You develop a severe headache or confusion.  You have unusual weakness or numbness.  You feel faint.  You have severe pain in your chest or abdomen.  You  vomit repeatedly.  You have trouble breathing. Summary  Hypertension is when the force of blood pumping through your arteries is too strong. If this condition is not controlled, it may put you at risk for serious complications.  Your personal target blood pressure may vary depending on your medical conditions, your age, and other factors. For most people, a normal blood pressure is less than 120/80.  Hypertension is treated with lifestyle changes, medicines, or a combination of both. Lifestyle changes include weight loss, eating a healthy, low-sodium diet, exercising more, and limiting alcohol. This information is not intended to replace advice given to you by your health care provider. Make sure you discuss any questions you have with your health care provider. Document Released: 03/02/2005 Document Revised: 01/29/2016  Document Reviewed: 01/29/2016 Elsevier Interactive Patient Education  2019 [employer].

## 2018-05-02 ENCOUNTER — Other Ambulatory Visit (INDEPENDENT_AMBULATORY_CARE_PROVIDER_SITE_OTHER): Payer: Medicare Other

## 2018-05-02 DIAGNOSIS — R946 Abnormal results of thyroid function studies: Secondary | ICD-10-CM

## 2018-05-02 DIAGNOSIS — R7989 Other specified abnormal findings of blood chemistry: Secondary | ICD-10-CM | POA: Diagnosis not present

## 2018-05-03 DIAGNOSIS — F4323 Adjustment disorder with mixed anxiety and depressed mood: Secondary | ICD-10-CM | POA: Diagnosis not present

## 2018-05-03 LAB — T3, FREE: T3 FREE: 2.8 pg/mL (ref 2.3–4.2)

## 2018-05-03 LAB — T4, FREE: Free T4: 1.06 ng/dL (ref 0.60–1.60)

## 2018-05-05 NOTE — Telephone Encounter (Signed)
I spoke with the patient and advised her that I have pulled some samples for her of Eliquis. She may come pick those up at her convenience.   Samples given: Eliquis 5 mg Lot: [id number] Exp: 6/22 # 2 boxes

## 2018-05-05 NOTE — Telephone Encounter (Signed)
Patient daughter Gina Frazier calling - would like to check on samples  Patient calling the office for samples of medication:   1.  What medication and dosage are you requesting samples for? Eliquis 5 MG  2.  Are you currently out of this medication? yes  Best number to contact is [phone]

## 2018-05-31 ENCOUNTER — Ambulatory Visit: Payer: Medicare Other | Admitting: Internal Medicine

## 2018-06-07 ENCOUNTER — Other Ambulatory Visit: Payer: Self-pay

## 2018-06-07 ENCOUNTER — Encounter: Payer: Self-pay | Admitting: Internal Medicine

## 2018-06-07 ENCOUNTER — Ambulatory Visit (INDEPENDENT_AMBULATORY_CARE_PROVIDER_SITE_OTHER): Payer: Medicare Other | Admitting: Internal Medicine

## 2018-06-07 VITALS — BP 146/86 | HR 63 | Temp 98.3°F | Ht 62.0 in | Wt 157.6 lb

## 2018-06-07 DIAGNOSIS — G4700 Insomnia, unspecified: Secondary | ICD-10-CM

## 2018-06-07 DIAGNOSIS — I10 Essential (primary) hypertension: Secondary | ICD-10-CM

## 2018-06-07 DIAGNOSIS — R7989 Other specified abnormal findings of blood chemistry: Secondary | ICD-10-CM | POA: Diagnosis not present

## 2018-06-07 DIAGNOSIS — R946 Abnormal results of thyroid function studies: Secondary | ICD-10-CM | POA: Diagnosis not present

## 2018-06-07 DIAGNOSIS — I480 Paroxysmal atrial fibrillation: Secondary | ICD-10-CM

## 2018-06-07 DIAGNOSIS — I4891 Unspecified atrial fibrillation: Secondary | ICD-10-CM

## 2018-06-07 DIAGNOSIS — F5105 Insomnia due to other mental disorder: Secondary | ICD-10-CM | POA: Insufficient documentation

## 2018-06-07 DIAGNOSIS — M25561 Pain in right knee: Secondary | ICD-10-CM | POA: Diagnosis not present

## 2018-06-07 DIAGNOSIS — Z1231 Encounter for screening mammogram for malignant neoplasm of breast: Secondary | ICD-10-CM | POA: Diagnosis not present

## 2018-06-07 MED ORDER — LOSARTAN POTASSIUM 50 MG PO TABS: 50.0000 mg | ORAL_TABLET | Freq: Every day | ORAL | 3 refills | Status: DC

## 2018-06-07 MED ORDER — PROPRANOLOL HCL ER 60 MG PO CP24: 60.0000 mg | ORAL_CAPSULE | Freq: Every day | ORAL | 3 refills | Status: DC

## 2018-06-07 MED ORDER — ZOLPIDEM TARTRATE 5 MG PO TABS: 5.0000 mg | ORAL_TABLET | Freq: Every evening | ORAL | 0 refills | Status: DC | PRN
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
## 2018-06-07 NOTE — Patient Instructions (Addendum)
Increase losartan to 50 mg at night that would be 2 pills of 25 mg or 1 50 mg tablet for blood pressure     DASH Eating Plan DASH stands for "Dietary Approaches to Stop Hypertension." The DASH eating plan is a healthy eating plan that has been shown to reduce high blood pressure (hypertension). It may also reduce your risk for type 2 diabetes, heart disease, and stroke. The DASH eating plan may also help with weight loss. What are tips for following this plan?  General guidelines  Avoid eating more than 2,300 mg (milligrams) of salt (sodium) a day. If you have hypertension, you may need to reduce your sodium intake to 1,500 mg a day.  Limit alcohol intake to no more than 1 drink a day for nonpregnant women and 2 drinks a day for men. One drink equals 12 oz of beer, 5 oz of wine, or 1 oz of hard liquor.  Work with your health care provider to maintain a healthy body weight or to lose weight. Ask what an ideal weight is for you.  Get at least 30 minutes of exercise that causes your heart to beat faster (aerobic exercise) most days of the week. Activities may include walking, swimming, or biking.  Work with your health care provider or diet and nutrition specialist (dietitian) to adjust your eating plan to your individual calorie needs. Reading food labels   Check food labels for the amount of sodium per serving. Choose foods with less than 5 percent of the Daily Value of sodium. Generally, foods with less than 300 mg of sodium per serving fit into this eating plan.  To find whole grains, look for the word "whole" as the first word in the ingredient list. Shopping  Buy products labeled as "low-sodium" or "no salt added."  Buy fresh foods. Avoid canned foods and premade or frozen meals. Cooking  Avoid adding salt when cooking. Use salt-free seasonings or herbs instead of table salt or sea salt. Check with your health care provider or pharmacist before using salt substitutes.  Do not  fry foods. Cook foods using healthy methods such as baking, boiling, grilling, and broiling instead.  Cook with heart-healthy oils, such as olive, canola, soybean, or sunflower oil. Meal planning  Eat a balanced diet that includes: ? 5 or more servings of fruits and vegetables each day. At each meal, try to fill half of your plate with fruits and vegetables. ? Up to 6-8 servings of whole grains each day. ? Less than 6 oz of lean meat, poultry, or fish each day. A 3-oz serving of meat is about the same size as a deck of cards. One egg equals 1 oz. ? 2 servings of low-fat dairy each day. ? A serving of nuts, seeds, or beans 5 times each week. ? Heart-healthy fats. Healthy fats called Omega-3 fatty acids are found in foods such as flaxseeds and coldwater fish, like sardines, salmon, and mackerel.  Limit how much you eat of the following: ? Canned or prepackaged foods. ? Food that is high in trans fat, such as fried foods. ? Food that is high in saturated fat, such as fatty meat. ? Sweets, desserts, sugary drinks, and other foods with added sugar. ? Full-fat dairy products.  Do not salt foods before eating.  Try to eat at least 2 vegetarian meals each week.  Eat more home-cooked food and less restaurant, buffet, and fast food.  When eating at a restaurant, ask that your food be  prepared with less salt or no salt, if possible. What foods are recommended? The items listed may not be a complete list. Talk with your dietitian about what dietary choices are best for you. Grains Whole-grain or whole-wheat bread. Whole-grain or whole-wheat pasta. Brown rice. [place]. Bulgur. Whole-grain and low-sodium cereals. Pita bread. Low-fat, low-sodium crackers. Whole-wheat flour tortillas. Vegetables Fresh or frozen vegetables (raw, steamed, roasted, or grilled). Low-sodium or reduced-sodium tomato and vegetable juice. Low-sodium or reduced-sodium tomato sauce and tomato paste. Low-sodium or  reduced-sodium canned vegetables. Fruits All fresh, dried, or frozen fruit. Canned fruit in natural juice (without added sugar). Meat and other protein foods Skinless chicken or [place]. Ground chicken or [place]. Pork with fat trimmed off. Fish and seafood. Egg whites. Dried beans, peas, or lentils. Unsalted nuts, nut butters, and seeds. Unsalted canned beans. Lean cuts of beef with fat trimmed off. Low-sodium, lean deli meat. Dairy Low-fat (1%) or fat-free (skim) milk. Fat-free, low-fat, or reduced-fat cheeses. Nonfat, low-sodium ricotta or cottage cheese. Low-fat or nonfat yogurt. Low-fat, low-sodium cheese. Fats and oils Soft margarine without trans fats. Vegetable oil. Low-fat, reduced-fat, or light mayonnaise and salad dressings (reduced-sodium). Canola, safflower, olive, soybean, and sunflower oils. Avocado. Seasoning and other foods Herbs. Spices. Seasoning mixes without salt. Unsalted popcorn and pretzels. Fat-free sweets. What foods are not recommended? The items listed may not be a complete list. Talk with your dietitian about what dietary choices are best for you. Grains Baked goods made with fat, such as croissants, muffins, or some breads. Dry pasta or rice meal packs. Vegetables Creamed or fried vegetables. Vegetables in a cheese sauce. Regular canned vegetables (not low-sodium or reduced-sodium). Regular canned tomato sauce and paste (not low-sodium or reduced-sodium). Regular tomato and vegetable juice (not low-sodium or reduced-sodium). Angie Fava. Olives. Fruits Canned fruit in a light or heavy syrup. Fried fruit. Fruit in cream or butter sauce. Meat and other protein foods Fatty cuts of meat. Ribs. Fried meat. Berniece Salines. Sausage. Bologna and other processed lunch meats. Salami. Fatback. Hotdogs. Bratwurst. Salted nuts and seeds. Canned beans with added salt. Canned or smoked fish. Whole eggs or egg yolks. Chicken or [place] with skin. Dairy Whole or 2% milk, cream, and half-and-half.  Whole or full-fat cream cheese. Whole-fat or sweetened yogurt. Full-fat cheese. Nondairy creamers. Whipped toppings. Processed cheese and cheese spreads. Fats and oils Butter. Stick margarine. Lard. Shortening. Ghee. Bacon fat. Tropical oils, such as coconut, palm kernel, or palm oil. Seasoning and other foods Salted popcorn and pretzels. Onion salt, garlic salt, seasoned salt, table salt, and sea salt. Worcestershire sauce. Tartar sauce. Barbecue sauce. Teriyaki sauce. Soy sauce, including reduced-sodium. Steak sauce. Canned and packaged gravies. Fish sauce. Oyster sauce. Cocktail sauce. Horseradish that you find on the shelf. Ketchup. Mustard. Meat flavorings and tenderizers. Bouillon cubes. Hot sauce and Tabasco sauce. Premade or packaged marinades. Premade or packaged taco seasonings. Relishes. Regular salad dressings. Where to find more information:  National Heart, Lung, and [place]: https://wilson-eaton.com/  American Heart Association: www.heart.org Summary  The DASH eating plan is a healthy eating plan that has been shown to reduce high blood pressure (hypertension). It may also reduce your risk for type 2 diabetes, heart disease, and stroke.  With the DASH eating plan, you should limit salt (sodium) intake to 2,300 mg a day. If you have hypertension, you may need to reduce your sodium intake to 1,500 mg a day.  When on the DASH eating plan, aim to eat more fresh fruits and vegetables, whole grains,  lean proteins, low-fat dairy, and heart-healthy fats.  Work with your health care provider or diet and nutrition specialist (dietitian) to adjust your eating plan to your individual calorie needs. This information is not intended to replace advice given to you by your health care provider. Make sure you discuss any questions you have with your health care provider. Document Released: 02/19/2011 Document Revised: 02/24/2016 Document Reviewed: 02/24/2016 Elsevier Interactive Patient Education   2019 [employer].

## 2018-06-07 NOTE — Progress Notes (Signed)
Pre visit review using our clinic review tool, if applicable. No additional management support is needed unless otherwise documented below in the visit note. 

## 2018-06-08 ENCOUNTER — Telehealth: Payer: Self-pay

## 2018-06-08 LAB — THYROID PEROXIDASE ANTIBODY: Thyroperoxidase Ab SerPl-aCnc: <1 IU/mL (ref <9)

## 2018-06-08 LAB — TSH: TSH: 4.28 m[IU]/L (ref 0.40–4.50)

## 2018-06-08 NOTE — Telephone Encounter (Signed)
TELEPHONE CALL NOTE  Gina Frazier has been deemed a candidate for a follow-up tele-health visit to limit community exposure during the Covid-19 pandemic. I spoke with the patient via phone to ensure availability of phone/video source, confirm preferred email & phone number, discuss instructions and expectations, and review consent.   I reminded Gina Frazier to be prepared with any vital sign and/or heart rhythm information that could potentially be obtained via home monitoring, at the time of her visit.  Finally, I reminded Gina Frazier to expect an e-mail containing a link for their video-based visit approximately 15 minutes before her visit, or alternatively, a phone call at the time of her visit if her visit is planned to be a phone encounter.  Did the patient verbally consent to treatment as below? YES  Gina Frazier 06/08/2018 3:27 PM  DOWNLOADING THE SOFTWARE (If applicable)  Download the [employer] app to enable video and telephone visits with your [facility] Provider.   Instructions for downloading [employer] WebEx: - Go to https://www.webex.com/downloads.html and follow the instructions - If you have technical difficulties with downloading WebEx, please call WebEx at [phone]. - Once the app is downloaded (can be done on either mobile or desktop computer), go to Settings in the upper left hand corner.  Be sure that camera and audio are enabled.  - You will receive an email message with a link to the meeting with a time to join for your tele-health visit.  - Please download the app and have settings configured prior to the appointment time.    CONSENT FOR TELE-HEALTH VISIT - PLEASE REVIEW  I hereby voluntarily request, consent and authorize [facility] and its employed or contracted physicians, physician assistants, nurse practitioners or other licensed health care professionals (the Practitioner), to provide me with telemedicine health care services (the  Services") as deemed necessary by the treating Practitioner. I acknowledge and consent to receive the Services by the Practitioner via telemedicine. I understand that the telemedicine visit will involve communicating with the Practitioner through live audiovisual communication technology and the disclosure of certain medical information by electronic transmission. I acknowledge that I have been given the opportunity to request an in-person assessment or other available alternative prior to the telemedicine visit and am voluntarily participating in the telemedicine visit.  I understand that I have the right to withhold or withdraw my consent to the use of telemedicine in the course of my care at any time, without affecting my right to future care or treatment, and that the Practitioner or I may terminate the telemedicine visit at any time. I understand that I have the right to inspect all information obtained and/or recorded in the course of the telemedicine visit and may receive copies of available information for a reasonable fee.  I understand that some of the potential risks of receiving the Services via telemedicine include:   Delay or interruption in medical evaluation due to technological equipment failure or disruption;  Information transmitted may not be sufficient (e.g. poor resolution of images) to allow for appropriate medical decision making by the Practitioner; and/or   In rare instances, security protocols could fail, causing a breach of personal health information.  Furthermore, I acknowledge that it is my responsibility to provide information about my medical history, conditions and care that is complete and accurate to the best of my ability. I acknowledge that Practitioner's advice, recommendations, and/or decision may be based on factors not within their control, such as incomplete or inaccurate data  provided by me or distortions of diagnostic images or specimens that may result from  electronic transmissions. I understand that the practice of medicine is not an exact science and that Practitioner makes no warranties or guarantees regarding treatment outcomes. I acknowledge that I will receive a copy of this consent concurrently upon execution via email to the email address I last provided but may also request a printed copy by calling the office of [place].    I understand that my insurance will be billed for this visit.   I have read or had this consent read to me.  I understand the contents of this consent, which adequately explains the benefits and risks of the Services being provided via telemedicine.   I have been provided ample opportunity to ask questions regarding this consent and the Services and have had my questions answered to my satisfaction.  I give my informed consent for the services to be provided through the use of telemedicine in my medical care  By participating in this telemedicine visit I agree to the above.

## 2018-06-08 NOTE — Telephone Encounter (Signed)
Emailed patient a copy of consent form.  Awaiting response.

## 2018-06-10 ENCOUNTER — Other Ambulatory Visit: Payer: Self-pay

## 2018-06-10 ENCOUNTER — Encounter: Payer: Self-pay | Admitting: Internal Medicine

## 2018-06-10 ENCOUNTER — Other Ambulatory Visit: Payer: Self-pay | Admitting: *Deleted

## 2018-06-10 ENCOUNTER — Telehealth (INDEPENDENT_AMBULATORY_CARE_PROVIDER_SITE_OTHER): Payer: Medicare Other | Admitting: Cardiovascular Disease

## 2018-06-10 ENCOUNTER — Telehealth: Payer: Self-pay

## 2018-06-10 DIAGNOSIS — I10 Essential (primary) hypertension: Secondary | ICD-10-CM | POA: Diagnosis not present

## 2018-06-10 DIAGNOSIS — I079 Rheumatic tricuspid valve disease, unspecified: Secondary | ICD-10-CM | POA: Diagnosis not present

## 2018-06-10 DIAGNOSIS — F419 Anxiety disorder, unspecified: Secondary | ICD-10-CM

## 2018-06-10 DIAGNOSIS — I480 Paroxysmal atrial fibrillation: Secondary | ICD-10-CM | POA: Diagnosis not present

## 2018-06-10 DIAGNOSIS — R079 Chest pain, unspecified: Secondary | ICD-10-CM

## 2018-06-10 MED ORDER — APIXABAN 5 MG PO TABS: 5.0000 mg | ORAL_TABLET | Freq: Two times a day (BID) | ORAL | 3 refills | Status: DC

## 2018-06-10 MED ORDER — FLECAINIDE ACETATE 50 MG PO TABS: 50.0000 mg | ORAL_TABLET | Freq: Two times a day (BID) | ORAL | 3 refills | Status: DC

## 2018-06-10 NOTE — Patient Instructions (Addendum)

## 2018-06-10 NOTE — Telephone Encounter (Signed)
prior auth approved from  03-16-18 to  03-16-19 for the zolpidem trartrate

## 2018-06-10 NOTE — Progress Notes (Signed)
Telephone Visit     Evaluation Performed:  Follow-up visit  This visit type was conducted due to national recommendations for restrictions regarding the COVID-19 Pandemic (e.g. social distancing).  This format is felt to be most appropriate for this patient at this time.  All issues noted in this document were discussed and addressed.  No physical exam was performed (except for noted visual exam findings with Telehealth visits).  See MyChart message from today for the patient's consent to telehealth for [facility].  Date:  06/10/2018   ID:  Gina Frazier, DOB December 21, 1945, MRN [id number]  Patient Location:  [address] [place] [address]   Provider location:   [facility], [place] office  PCP:  McLean-Scocuzza, Nino Glow, MD  Cardiologist:  No primary care provider on file.   Chief Complaint: Anxious, high blood pressure    History of Present Illness:    Gina Frazier is a 73 y.o. female who presents via audio/video conferencing for a telehealth visit today.   The patient does not symptoms concerning for COVID-19 infection (fever, chills, cough, or new SHORTNESS OF BREATH).  Please refer to prior office visit for complete details: Patient has a past medical history of  Hypertension Evaluated in 2012 for acute hypertension in the dentist chair,  hospital with  atrial fibrillation on 04/28/2014, started on Cardizem, Lopressor converting to normal sinus rhythm (Prior episode 6 months prior) started on anticoagulation with eliquis 5 mg by mouth twice a day,   flecainide 50 mg twice a day, continued on beta blocker. She presents today for follow-up of her atrial fibrillation, hypertension, left chest pain  Tingling left neck, left chest, left arm Went to the ER 06/08/2017  Recently seen by primary care, losartan increased up to 50 mg daily  Lab work reviewed  total cholesterol 179 LDL 106 TSH 5.1 Creatinine potassium 4.3  Pressure up today Worried  about virus outbreak, Checked her blood pressure, 637 systolic, later in day 858 Now running in the 150 range  No atrial fib,  No tachycardia or palpitations Walking, outside, Moved to third floor, walks the steps every day  No chest pain with exertion No tingling in the chest Taking virus precautions  Prior CV studies:   The following studies were reviewed today:  Echocardiogram from the hospital shows ejection fraction 60-65%, otherwise normal study   Past Medical History:  Diagnosis Date  . Allergy   . Anxiety   . Chronic back pain   . Family history of adverse reaction to anesthesia    pts daughter had severe vomiting 2014   . Hip joint pain    left  . Hypertension   . Osteoarthritis   . PAF (paroxysmal atrial fibrillation) ([place])    a. initially diagnosed 04/2014; b. on Eliquis; c. CHADS2VASc = 3  . Palpitations   . Wears contact lenses   . Wears dentures    partial upper   Past Surgical History:  Procedure Laterality Date  . CHOLECYSTECTOMY    . COLONOSCOPY WITH PROPOFOL N/A 11/05/2014   Procedure: COLONOSCOPY WITH PROPOFOL;  Surgeon: Lucilla Lame, MD;  Location: [facility];  Service: Endoscopy;  Laterality: N/A;  . JOINT REPLACEMENT     left knee x 2 04/2024 and 01/2017 Dr. Rudene Christians   . KNEE ARTHROSCOPY Left 06/28/2014   Procedure: ARTHROSCOPY KNEE WITH PARTIALLATERAL MENISECTOMY AND DEBRIDEMENT;  Surgeon: Susa Day, MD;  Location[facility];  Service: Orthopedics;  Laterality: Left;  . TOOTH  EXTRACTION    . 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 TOTAL KNEE REVISION Left 02/11/2017   Procedure: TOTAL KNEE REVISION, POLYETHELENE EXCHANGE;  Surgeon: Hessie Knows, MD;  Location: [facility];  Service: Orthopedics;  Laterality: Left;     No outpatient medications have been marked as taking for the 06/10/18 encounter (Appointment) with  Minna Merritts, MD.     Allergies:   Fish allergy   Social History   Tobacco Use  . Smoking status: Never Smoker  . Smokeless tobacco: Never Used  Substance Use Topics  . Alcohol use: No  . Drug use: No     Family Hx: The patient's family history includes Arthritis in her daughter; Breast cancer in an other family member; Cancer in an other family member; Depression in her daughter, sister, and another family member; Diabetes in her mother and sister; GER disease in her daughter; Heart disease in her brother, father, mother, and another family member; Hypertension in her brother, mother, and sister; Hypothyroidism in her daughter; Lupus in her daughter; Mental illness in her mother.  ROS:   Please see the history of present illness.    Review of Systems  Constitutional: Negative.   Respiratory: Negative.   Cardiovascular: Negative.   Gastrointestinal: Negative.   Musculoskeletal: Negative.   Neurological: Negative.   Psychiatric/Behavioral: The patient is nervous/anxious.   All other systems reviewed and are negative.    Labs/Other Tests and Data Reviewed:    Recent Labs: 04/29/2018: ALT 7; BUN 20; Creatinine, Ser 0.85; Hemoglobin 13.0; Platelets 186.0; Potassium 4.3; Sodium 141 06/07/2018: TSH 4.28   Recent Lipid Panel Lab Results  Component Value Date/Time   CHOL 179 04/29/2018 08:32 AM   TRIG 49.0 04/29/2018 08:32 AM   HDL 62.90 04/29/2018 08:32 AM   CHOLHDL 3 04/29/2018 08:32 AM   LDLCALC 106 (H) 04/29/2018 08:32 AM    Wt Readings from Last 3 Encounters:  06/07/18 157 lb 9.6 oz (71.5 kg)  04/29/18 161 lb 1.9 oz (73.1 kg)  07/05/17 160 lb (72.6 kg)     Exam:    Vital Signs:  There were no vitals taken for this visit.   Well nourished, well developed female in no acute distress.   ASSESSMENT & PLAN:    PAF (paroxysmal atrial fibrillation) (HCC) Denies any tachycardia or palpitations concerning for arrhythmia No changes to her medications We will  refill her medications  Essential hypertension Long discussion concerning her blood pressure Recent increase up to losartan 50 daily Blood pressure continues to run high today 150 up to 170 Possibly exacerbated by stressors from recent viral outbreak, watching the news Recommend she try not to get anxious, monitor her pressure If numbers do continue to run high potentially could increase losartan up to 100 daily Suggested she call our office if pressures do not improve  Chest pain, unspecified type Denies any chest pain symptoms Previously had atypical symptoms, Now exerting herself with no chest discomfort  Anxiety Recommend she start regular walking program for anxiety   COVID-19 Education: The signs and symptoms of COVID-19 were discussed with the patient and how to seek care for testing (follow up with PCP or arrange E-visit).  The importance of social distancing was discussed today.  Patient Risk:   After full review of this patients clinical status, I feel that they are at least moderate risk at this  time.  Time:   Today, I have spent 25 minutes with the patient with telehealth technology discussing management of high blood pressure, management of anxiety. Discussion concerning her atrial fibrillation and management during the viral outbreak.   Medication Adjustments/Labs and Tests Ordered: Current medicines are reviewed at length with the patient today.  Concerns regarding medicines are outlined above.   Tests Ordered: No tests ordered   Medication Changes: No changes made   Disposition: Follow-up in 6 months   Signed, [name], MD  06/10/2018 3:31 PM    [facility] [address], [place], [place] [address]

## 2018-06-20 ENCOUNTER — Ambulatory Visit: Payer: Medicare Other | Admitting: Cardiovascular Disease

## 2018-07-26 ENCOUNTER — Encounter: Payer: Self-pay | Admitting: Psychiatry

## 2018-07-26 ENCOUNTER — Ambulatory Visit (INDEPENDENT_AMBULATORY_CARE_PROVIDER_SITE_OTHER): Payer: Medicare Other | Admitting: Psychiatry

## 2018-07-26 ENCOUNTER — Other Ambulatory Visit: Payer: Self-pay

## 2018-07-26 DIAGNOSIS — F321 Major depressive disorder, single episode, moderate: Secondary | ICD-10-CM

## 2018-07-26 DIAGNOSIS — F411 Generalized anxiety disorder: Secondary | ICD-10-CM | POA: Diagnosis not present

## 2018-07-26 DIAGNOSIS — F5105 Insomnia due to other mental disorder: Secondary | ICD-10-CM | POA: Diagnosis not present
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
## 2018-08-23 DIAGNOSIS — H25813 Combined forms of age-related cataract, bilateral: Secondary | ICD-10-CM | POA: Diagnosis not present

## 2018-09-15 ENCOUNTER — Other Ambulatory Visit: Payer: Self-pay

## 2018-09-15 ENCOUNTER — Ambulatory Visit (INDEPENDENT_AMBULATORY_CARE_PROVIDER_SITE_OTHER): Payer: Medicare Other | Admitting: Psychiatry

## 2018-09-15 DIAGNOSIS — Z5329 Procedure and treatment not carried out because of patient's decision for other reasons: Secondary | ICD-10-CM

## 2018-09-15 NOTE — Progress Notes (Signed)
Patient did not respond to calls

## 2018-09-19 ENCOUNTER — Encounter: Payer: Self-pay | Admitting: Psychiatry

## 2018-09-19 ENCOUNTER — Other Ambulatory Visit: Payer: Self-pay

## 2018-09-19 ENCOUNTER — Ambulatory Visit (INDEPENDENT_AMBULATORY_CARE_PROVIDER_SITE_OTHER): Payer: Medicare Other | Admitting: Psychiatry

## 2018-09-19 DIAGNOSIS — F321 Major depressive disorder, single episode, moderate: Secondary | ICD-10-CM | POA: Diagnosis not present

## 2018-09-19 DIAGNOSIS — F5105 Insomnia due to other mental disorder: Secondary | ICD-10-CM | POA: Diagnosis not present

## 2018-09-19 DIAGNOSIS — F411 Generalized anxiety disorder: Secondary | ICD-10-CM
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
## 2018-09-29 ENCOUNTER — Other Ambulatory Visit: Payer: Self-pay

## 2018-09-29 ENCOUNTER — Encounter: Payer: Self-pay | Admitting: Internal Medicine

## 2018-09-29 ENCOUNTER — Ambulatory Visit (INDEPENDENT_AMBULATORY_CARE_PROVIDER_SITE_OTHER): Payer: Medicare Other | Admitting: Internal Medicine

## 2018-09-29 DIAGNOSIS — R202 Paresthesia of skin: Secondary | ICD-10-CM

## 2018-09-29 DIAGNOSIS — R300 Dysuria: Secondary | ICD-10-CM

## 2018-09-29 DIAGNOSIS — R946 Abnormal results of thyroid function studies: Secondary | ICD-10-CM | POA: Diagnosis not present

## 2018-09-29 DIAGNOSIS — R350 Frequency of micturition: Secondary | ICD-10-CM | POA: Diagnosis not present

## 2018-09-29 DIAGNOSIS — M2550 Pain in unspecified joint: Secondary | ICD-10-CM | POA: Diagnosis not present

## 2018-09-29 DIAGNOSIS — I10 Essential (primary) hypertension: Secondary | ICD-10-CM

## 2018-09-29 NOTE — Patient Instructions (Signed)
Paresthesia Paresthesia is an abnormal burning or prickling sensation. It is usually felt in the hands, arms, legs, or feet. However, it may occur in any part of the body. Usually, paresthesia is not painful. It may feel like:  Tingling or numbness.  Buzzing.  Itching. Paresthesia may occur without any clear cause, or it may be caused by:  Breathing too quickly (hyperventilation).  Pressure on a nerve.  An underlying medical condition.  Side effects of a medication.  Nutritional deficiencies.  Exposure to toxic chemicals. Most people experience temporary (transient) paresthesia at some time in their lives. For some people, it may be long-lasting (chronic) because of an underlying medical condition. If you have paresthesia that lasts a long time, you may need to be evaluated by your health care provider. Follow these instructions at home: Alcohol use   Do not drink alcohol if: ? Your health care provider tells you not to drink. ? You are pregnant, may be pregnant, or are planning to become pregnant.  If you drink alcohol: ? Limit how much you use to:  0-1 drink a day for women.  0-2 drinks a day for men. ? Be aware of how much alcohol is in your drink. In the [place] one drink equals one 12 oz bottle of beer (355 mL), one 5 oz glass of wine (148 mL), or one 1 oz glass of hard liquor (44 mL). Nutrition   Eat a healthy diet. This includes: ? Eating foods that are high in fiber, such as fresh fruits and vegetables, whole grains, and beans. ? Limiting foods that are high in fat and processed sugars, such as fried or sweet foods. General instructions  Take over-the-counter and prescription medicines only as told by your health care provider.  Do not use any products that contain nicotine or tobacco, such as cigarettes and e-cigarettes. These can keep blood from reaching damaged nerves. If you need help quitting, ask your health care provider.  If you have diabetes, work  closely with your health care provider to keep your blood sugar under control.  If you have numbness in your feet: ? Check every day for signs of injury or infection. Watch for redness, warmth, and swelling. ? Wear padded socks and comfortable shoes. These help protect your feet.  Keep all follow-up visits as told by your health care provider. This is important. Contact a health care provider if you:  Have paresthesia that gets worse or does not go away.  Have a burning or prickling feeling that gets worse when you walk.  Have pain, cramps, or dizziness.  Develop a rash. Get help right away if you:  Feel weak.  Have trouble walking or moving.  Have problems with speech, understanding, or vision.  Feel confused.  Cannot control your bladder or bowel movements.  Have numbness after an injury.  Develop new weakness in an arm or leg.  Faint. Summary  Paresthesia is an abnormal burning or prickling sensation that is usually felt in the hands, arms, legs, or feet. It may also occur in other parts of the body.  Paresthesia may occur without any clear cause, or it may be caused by breathing too quickly (hyperventilation), pressure on a nerve, an underlying medical condition, side effects of a medication, nutritional deficiencies, or exposure to toxic chemicals.  If you have paresthesia that lasts a long time, you may need to be evaluated by your health care provider. This information is not intended to replace advice given to you by   your health care provider. Make sure you discuss any questions you have with your health care provider. Document Released: 02/20/2002 Document Revised: 03/28/2018 Document Reviewed: 03/11/2017 Elsevier Patient Education  2020 Elsevier Inc.  

## 2018-09-29 NOTE — Progress Notes (Signed)
Virtual Visit via Video Note  I connected with Gina Frazier  on 09/29/18 at  4:00 PM EDT by a video enabled telemedicine application and verified that I am speaking with the correct person using two identifiers.  Location patient: car Location provider:work or home office Persons participating in the virtual visit: patient, provider, pts daughter   I discussed the limitations of evaluation and management by telemedicine and the availability of in person appointments. The patient expressed understanding and agreed to proceed.   HPI: 1. C/o entire face tingling/burning x 1 month but worse since last week. Nothing tried. She denies any weakness in arms or legs smile is normal. 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 MRI brain in 2018 normal She does report h/o Bells palsy years ago affecting the left side of her face. She last saw Dr. Manuella Ghazi neurology 03/24/17 for polyneuropathy/peripheral and essential tremor and memory loss he wanted to start her on cymbalta 40 mg qd which she never started due to c/w side effects and she has not f/u since. For ET he has her on Propranolol LA 80 mg qd   2. C/o dysuria, increased frequency Q30 minutes last week now she is urinating 3-4 x in the day. She did experience a burning sensation though nothing tried.   3. Multiple joint pain in back and knees with abnormal imaging Xrays/MRIS she f/u with Dr. Rudene Christians ortho and h/o elevated CRP in 2018 5.8 ESR was 30 01/27/17  ROS: See pertinent positives and negatives per HPI.  Past Medical History:  Diagnosis Date  . Allergy   . Anxiety   . Bell's palsy    left  . Chronic back pain   . Family history of adverse reaction to anesthesia    pts daughter had severe vomiting 2014   . Hip joint pain    left  . Hypertension   . Osteoarthritis   . PAF (paroxysmal atrial fibrillation) ([place])    a. initially diagnosed 04/2014; b. on Eliquis; c. CHADS2VASc  = 3  . Palpitations   . Wears contact lenses   . Wears dentures    partial upper    Past Surgical History:  Procedure Laterality Date  . CHOLECYSTECTOMY    . COLONOSCOPY WITH PROPOFOL N/A 11/05/2014   Procedure: COLONOSCOPY WITH PROPOFOL;  Surgeon: Lucilla Lame, MD;  Location: [facility];  Service: Endoscopy;  Laterality: N/A;  . JOINT REPLACEMENT     left knee x 2 04/2024 and 01/2017 Dr. Rudene Christians   . KNEE ARTHROSCOPY Left 06/28/2014   Procedure: ARTHROSCOPY KNEE WITH PARTIALLATERAL MENISECTOMY AND DEBRIDEMENT;  Surgeon: Susa Day, MD;  Location: [facility];  Service: Orthopedics;  Laterality: Left;  . TOOTH EXTRACTION    . 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 TOTAL KNEE REVISION Left 02/11/2017   Procedure: TOTAL KNEE REVISION, POLYETHELENE EXCHANGE;  Surgeon: Hessie Knows, MD;  Location: [facility];  Service: Orthopedics;  Laterality: Left;    Family History  Problem Relation Age of Onset  . Heart disease Mother   . Hypertension Mother   . Mental illness Mother   . Diabetes Mother   . Heart disease Father   . Hypertension Sister   . Depression Sister   . Diabetes Sister   . Heart disease Brother   . Hypertension Brother   .  Arthritis Daughter   . GER disease Daughter   . Hypothyroidism Daughter   . Lupus Daughter   . Depression Daughter   . Cancer Other        colon cancer, breast cancer   . Heart disease Other   . Breast cancer Other   . Depression Other     SOCIAL HX: lives with daughter and grand daughter    Current Outpatient Medications:  .  acetaminophen (TYLENOL) 500 MG tablet, Take 1,000 mg every 8 (eight) hours as needed by mouth for mild pain., Disp: , Rfl:  .  apixaban (ELIQUIS) 5 MG TABS tablet, Take 1 tablet (5 mg total) by mouth 2 (two) times daily., Disp: 180 tablet, Rfl: 3 .  baclofen (LIORESAL) 10 MG tablet, ,  Disp: , Rfl:  .  Calcium Carbonate-Vitamin D 600-400 MG-UNIT tablet, Take by mouth., Disp: , Rfl:  .  Cholecalciferol (VITAMIN D) 2000 units tablet, Take 2,000 Units by mouth daily., Disp: , Rfl:  .  flecainide (TAMBOCOR) 50 MG tablet, Take 1 tablet (50 mg total) by mouth 2 (two) times daily., Disp: 180 tablet, Rfl: 3 .  losartan (COZAAR) 50 MG tablet, Take 1 tablet (50 mg total) by mouth daily. At night, Disp: 90 tablet, Rfl: 3 .  propranolol ER (INDERAL LA) 60 MG 24 hr capsule, Take 1 capsule (60 mg total) by mouth daily., Disp: 90 capsule, Rfl: 3 .  zolpidem (AMBIEN) 5 MG tablet, Take 1 tablet (5 mg total) by mouth at bedtime as needed for sleep., Disp: 90 tablet, Rfl: 0  EXAM:  VITALS per patient if applicable:  GENERAL: alert, oriented, appears well and in no acute distress  HEENT: atraumatic, conjunttiva clear, no obvious abnormalities on inspection of external nose and ears  NECK: normal movements of the head and neck  LUNGS: on inspection no signs of respiratory distress, breathing rate appears normal, no obvious gross SOB, gasping or wheezing  CV: no obvious cyanosis  MS: moves all visible extremities without noticeable abnormality  PSYCH/NEURO: pleasant and cooperative, no obvious depression or anxiety, speech and thought processing grossly intact  Normal smile, face symmetry, no arm or leg weakness moving all 4 extremities normally via video. No droop of eyelid or smile   ASSESSMENT AND PLAN:  Discussed the following assessment and plan:  Paresthesia diffuse in face V1-V3 b/l with Burning/Tingling sensation in face (? Etiology B12 def vs other etiology less likely shingles as been ongoing x 1 month and diffuse face involved) - Plan: Vitamin B12, TSH, Antinuclear Antib (ANA), Sedimentation rate, C-reactive protein, Rheumatoid Factor -advised pt if labs negative/normal rec f/u with Dr. Manuella Ghazi neurology and she is agreeable but for now wants to hold on referral -consider MRI  brain repeat as well with neurology if w/u negative   Urine frequency - Plan: Urinalysis, Routine w reflex microscopic, Urine Culture,  Dysuria  Polyarthralgia and paresthesia - Plan: Vitamin B12, TSH, Antinuclear Antib (ANA), Sedimentation rate, C-reactive protein, Rheumatoid Factor  Essential hypertension - Plan: Basic Metabolic Panel (BMET)  Last visit increase losartan to 50 mg qd  Pt currently on propranolol 60 LA not 80  Check Cr to f/u      I discussed the assessment and treatment plan with the patient. The patient was provided an opportunity to ask questions and all were answered. The patient agreed with the plan and demonstrated an understanding of the instructions.   The patient was advised to call back or seek an in-person evaluation if the  symptoms worsen or if the condition fails to improve as anticipated.  Time spent 15 minutes  Delorise Jackson, MD

## 2018-09-30 ENCOUNTER — Other Ambulatory Visit: Payer: Medicare Other

## 2018-09-30 ENCOUNTER — Other Ambulatory Visit: Payer: Self-pay

## 2018-09-30 DIAGNOSIS — I10 Essential (primary) hypertension: Secondary | ICD-10-CM | POA: Diagnosis not present

## 2018-09-30 DIAGNOSIS — R350 Frequency of micturition: Secondary | ICD-10-CM | POA: Diagnosis not present

## 2018-09-30 DIAGNOSIS — R946 Abnormal results of thyroid function studies: Secondary | ICD-10-CM | POA: Diagnosis not present

## 2018-09-30 DIAGNOSIS — R300 Dysuria: Secondary | ICD-10-CM

## 2018-09-30 DIAGNOSIS — R202 Paresthesia of skin: Secondary | ICD-10-CM

## 2018-09-30 DIAGNOSIS — M2550 Pain in unspecified joint: Secondary | ICD-10-CM

## 2018-09-30 NOTE — Addendum Note (Signed)
Addended by: Elpidio Galea T on: 09/30/2018 02:41 PM   Modules accepted: Orders

## 2018-10-01 LAB — VITAMIN B12: Vitamin B-12: 723 pg/mL (ref 200–1100)

## 2018-10-01 LAB — BASIC METABOLIC PANEL
BUN: 20 mg/dL (ref 7–25)
CO2: 28 mmol/L (ref 20–32)
Calcium: 9.2 mg/dL (ref 8.6–10.4)
Chloride: 103 mmol/L (ref 98–110)
Creat: 0.8 mg/dL (ref 0.60–0.93)
Glucose, Bld: 82 mg/dL (ref 65–99)
Potassium: 4.4 mmol/L (ref 3.5–5.3)
Sodium: 140 mmol/L (ref 135–146)

## 2018-10-01 LAB — C-REACTIVE PROTEIN: CRP: 6 mg/L (ref <8.0)

## 2018-10-01 LAB — SEDIMENTATION RATE: Sed Rate: 14 mm/h (ref 0–30)

## 2018-10-01 LAB — TSH: TSH: 3.32 mIU/L (ref 0.40–4.50)

## 2018-10-02 LAB — URINALYSIS, ROUTINE W REFLEX MICROSCOPIC
Bacteria, UA: NONE SEEN /HPF
Bilirubin Urine: NEGATIVE
Glucose, UA: NEGATIVE
Hyaline Cast: NONE SEEN /LPF
Ketones, ur: NEGATIVE
Leukocytes,Ua: NEGATIVE
Nitrite: NEGATIVE
Protein, ur: NEGATIVE
RBC / HPF: NONE SEEN /HPF (ref 0–2)
Specific Gravity, Urine: 1.016 (ref 1.001–1.03)
Squamous Epithelial / HPF: NONE SEEN /HPF (ref <=5)
WBC, UA: NONE SEEN /HPF (ref 0–5)
pH: 6 (ref 5.0–8.0)

## 2018-10-02 LAB — URINE CULTURE
MICRO NUMBER:: 678763
Result:: NO GROWTH
SPECIMEN QUALITY:: ADEQUATE

## 2018-10-03 LAB — ANTI-NUCLEAR AB-TITER (ANA TITER): ANA Titer 1: 1:320 {titer} — ABNORMAL HIGH

## 2018-10-03 LAB — ANA: Anti Nuclear Antibody (ANA): POSITIVE — AB

## 2018-10-03 LAB — RHEUMATOID FACTOR: Rheumatoid fact SerPl-aCnc: <14 IU/mL (ref <14)

## 2018-11-01 ENCOUNTER — Ambulatory Visit: Payer: Medicare Other

## 2018-11-01 ENCOUNTER — Ambulatory Visit (INDEPENDENT_AMBULATORY_CARE_PROVIDER_SITE_OTHER): Payer: Medicare Other | Admitting: *Deleted

## 2018-11-01 ENCOUNTER — Other Ambulatory Visit: Payer: Self-pay

## 2018-11-01 DIAGNOSIS — M810 Age-related osteoporosis without current pathological fracture: Secondary | ICD-10-CM | POA: Diagnosis not present

## 2018-11-01 MED ORDER — DENOSUMAB 60 MG/ML ~~LOC~~ SOSY
60.0000 mg | PREFILLED_SYRINGE | Freq: Once | SUBCUTANEOUS | Status: AC
  Administered 2018-11-01: 15:00:00 60 mg via SUBCUTANEOUS

## 2018-11-01 NOTE — Progress Notes (Signed)
Patient presented for Prolia injection to Right arm [place], patient voiced no concerns or complaints during or after injection. 

## 2018-11-02 ENCOUNTER — Other Ambulatory Visit: Payer: Self-pay | Admitting: Internal Medicine

## 2018-11-02 DIAGNOSIS — R768 Other specified abnormal immunological findings in serum: Secondary | ICD-10-CM

## 2018-11-08 ENCOUNTER — Other Ambulatory Visit: Payer: Self-pay

## 2018-11-10 ENCOUNTER — Ambulatory Visit (INDEPENDENT_AMBULATORY_CARE_PROVIDER_SITE_OTHER): Payer: Medicare Other | Admitting: Internal Medicine

## 2018-11-10 ENCOUNTER — Other Ambulatory Visit: Payer: Self-pay

## 2018-11-10 VITALS — BP 138/70 | HR 61 | Temp 95.4°F | Resp 16 | Wt 160.9 lb

## 2018-11-10 DIAGNOSIS — Z23 Encounter for immunization: Secondary | ICD-10-CM | POA: Diagnosis not present

## 2018-11-10 DIAGNOSIS — I10 Essential (primary) hypertension: Secondary | ICD-10-CM | POA: Diagnosis not present

## 2018-11-10 DIAGNOSIS — G4700 Insomnia, unspecified: Secondary | ICD-10-CM
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
## 2018-11-10 NOTE — Patient Instructions (Signed)
L theanine stress relax brand 100 mg-200 mg daily   Living With Anxiety  After being diagnosed with an anxiety disorder, you may be relieved to know why you have felt or behaved a certain way. It is natural to also feel overwhelmed about the treatment ahead and what it will mean for your life. With care and support, you can manage this condition and recover from it. How to cope with anxiety Dealing with stress Stress is your body's reaction to life changes and events, both good and bad. Stress can last just a few hours or it can be ongoing. Stress can play a major role in anxiety, so it is important to learn both how to cope with stress and how to think about it differently. Talk with your health care provider or a counselor to learn more about stress reduction. He or she may suggest some stress reduction techniques, such as:  Music therapy. This can include creating or listening to music that you enjoy and that inspires you.  Mindfulness-based meditation. This involves being aware of your normal breaths, rather than trying to control your breathing. It can be done while sitting or walking.  Centering prayer. This is a kind of meditation that involves focusing on a word, phrase, or sacred image that is meaningful to you and that brings you peace.  Deep breathing. To do this, expand your stomach and inhale slowly through your nose. Hold your breath for 3-5 seconds. Then exhale slowly, allowing your stomach muscles to relax.  Self-talk. This is a skill where you identify thought patterns that lead to anxiety reactions and correct those thoughts.  Muscle relaxation. This involves tensing muscles then relaxing them. Choose a stress reduction technique that fits your lifestyle and personality. Stress reduction techniques take time and practice. Set aside 5-15 minutes a day to do them. Therapists can offer training in these techniques. The training may be covered by some insurance plans. Other things  you can do to manage stress include:  Keeping a stress diary. This can help you learn what triggers your stress and ways to control your response.  Thinking about how you respond to certain situations. You may not be able to control everything, but you can control your reaction.  Making time for activities that help you relax, and not feeling guilty about spending your time in this way. Therapy combined with coping and stress-reduction skills provides the best chance for successful treatment. Medicines Medicines can help ease symptoms. Medicines for anxiety include:  Anti-anxiety drugs.  Antidepressants.  Beta-blockers. Medicines may be used as the main treatment for anxiety disorder, along with therapy, or if other treatments are not working. Medicines should be prescribed by a health care provider. Relationships Relationships can play a big part in helping you recover. Try to spend more time connecting with trusted friends and family members. Consider going to couples counseling, taking family education classes, or going to family therapy. Therapy can help you and others better understand the condition. How to recognize changes in your condition Everyone has a different response to treatment for anxiety. Recovery from anxiety happens when symptoms decrease and stop interfering with your daily activities at home or work. This may mean that you will start to:  Have better concentration and focus.  Sleep better.  Be less irritable.  Have more energy.  Have improved memory. It is important to recognize when your condition is getting worse. Contact your health care provider if your symptoms interfere with home or work  and you do not feel like your condition is improving. Where to find help and support: You can get help and support from these sources:  Self-help groups.  Online and [employer].  A trusted spiritual leader.  Couples counseling.  Family education  classes.  Family therapy. Follow these instructions at home:  Eat a healthy diet that includes plenty of vegetables, fruits, whole grains, low-fat dairy products, and lean protein. Do not eat a lot of foods that are high in solid fats, added sugars, or salt.  Exercise. Most adults should do the following: ? Exercise for at least 150 minutes each week. The exercise should increase your heart rate and make you sweat (moderate-intensity exercise). ? Strengthening exercises at least twice a week.  Cut down on caffeine, tobacco, alcohol, and other potentially harmful substances.  Get the right amount and quality of sleep. Most adults need 7-9 hours of sleep each night.  Make choices that simplify your life.  Take over-the-counter and prescription medicines only as told by your health care provider.  Avoid caffeine, alcohol, and certain over-the-counter cold medicines. These may make you feel worse. Ask your pharmacist which medicines to avoid.  Keep all follow-up visits as told by your health care provider. This is important. Questions to ask your health care provider  Would I benefit from therapy?  How often should I follow up with a health care provider?  How long do I need to take medicine?  Are there any long-term side effects of my medicine?  Are there any alternatives to taking medicine? Contact a health care provider if:  You have a hard time staying focused or finishing daily tasks.  You spend many hours a day feeling worried about everyday life.  You become exhausted by worry.  You start to have headaches, feel tense, or have nausea.  You urinate more than normal.  You have diarrhea. Get help right away if:  You have a racing heart and shortness of breath.  You have thoughts of hurting yourself or others. If you ever feel like you may hurt yourself or others, or have thoughts about taking your own life, get help right away. You can go to your nearest emergency  department or call:  Your local emergency services (911 in the [place]  A suicide crisis helpline, such as the [facility] at 949-023-8687. This is open 24-hours a day. Summary  Taking steps to deal with stress can help calm you.  Medicines cannot cure anxiety disorders, but they can help ease symptoms.  Family, friends, and partners can play a big part in helping you recover from an anxiety disorder. This information is not intended to replace advice given to you by your health care provider. Make sure you discuss any questions you have with your health care provider. Document Released: 02/25/2016 Document Revised: 02/12/2017 Document Reviewed: 02/25/2016 Elsevier Patient Education  2020 [employer].

## 2018-11-11 MED ORDER — ZOLPIDEM TARTRATE 5 MG PO TABS: 5.0000 mg | ORAL_TABLET | Freq: Every evening | ORAL | 1 refills | Status: DC | PRN

## 2018-11-11 NOTE — Addendum Note (Signed)
Addended by: Orland Mustard on: 11/11/2018 06:26 PM   Modules accepted: Orders

## 2018-11-14 ENCOUNTER — Other Ambulatory Visit: Payer: Self-pay | Admitting: Cardiovascular Disease

## 2018-11-14 NOTE — Telephone Encounter (Signed)
Please review for further refills, thanks !

## 2018-11-14 NOTE — Telephone Encounter (Signed)
Pt's age 73, wt 3 kg, SCr 0.8, CrCl 70.02, last ov w/ TG 06/10/18.

## 2018-11-16 DIAGNOSIS — R768 Other specified abnormal immunological findings in serum: Secondary | ICD-10-CM | POA: Diagnosis not present

## 2018-11-16 DIAGNOSIS — M25461 Effusion, right knee: Secondary | ICD-10-CM | POA: Diagnosis not present

## 2019-01-18 ENCOUNTER — Ambulatory Visit (INDEPENDENT_AMBULATORY_CARE_PROVIDER_SITE_OTHER): Payer: Medicare Other | Admitting: Psychiatry

## 2019-01-18 ENCOUNTER — Other Ambulatory Visit: Payer: Self-pay

## 2019-01-18 ENCOUNTER — Encounter: Payer: Self-pay | Admitting: Psychiatry

## 2019-01-18 DIAGNOSIS — F5105 Insomnia due to other mental disorder: Secondary | ICD-10-CM | POA: Diagnosis not present

## 2019-01-18 DIAGNOSIS — F325 Major depressive disorder, single episode, in full remission: Secondary | ICD-10-CM

## 2019-01-18 DIAGNOSIS — F411 Generalized anxiety disorder: Secondary | ICD-10-CM | POA: Insufficient documentation

## 2019-01-18 DIAGNOSIS — F321 Major depressive disorder, single episode, moderate: Secondary | ICD-10-CM | POA: Insufficient documentation
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
## 2019-02-08 ENCOUNTER — Other Ambulatory Visit: Payer: Self-pay

## 2019-02-16 ENCOUNTER — Encounter: Payer: Self-pay | Admitting: Internal Medicine

## 2019-02-16 ENCOUNTER — Ambulatory Visit (INDEPENDENT_AMBULATORY_CARE_PROVIDER_SITE_OTHER): Payer: Medicare Other | Admitting: Internal Medicine

## 2019-02-16 VITALS — Ht 62.0 in | Wt 160.0 lb

## 2019-02-16 DIAGNOSIS — I10 Essential (primary) hypertension: Secondary | ICD-10-CM

## 2019-02-16 DIAGNOSIS — R413 Other amnesia: Secondary | ICD-10-CM | POA: Insufficient documentation

## 2019-02-16 DIAGNOSIS — F5105 Insomnia due to other mental disorder: Secondary | ICD-10-CM | POA: Diagnosis not present

## 2019-02-16 DIAGNOSIS — F411 Generalized anxiety disorder: Secondary | ICD-10-CM

## 2019-02-16 DIAGNOSIS — R319 Hematuria, unspecified: Secondary | ICD-10-CM

## 2019-02-16 DIAGNOSIS — F321 Major depressive disorder, single episode, moderate: Secondary | ICD-10-CM | POA: Diagnosis not present

## 2019-02-16 NOTE — Progress Notes (Signed)
Virtual Visit via Video Note  I connected with Gina Frazier  on 02/16/19 at  1:30 PM EST by a video enabled telemedicine application and verified that I am speaking with the correct person using two identifiers.  Location patient: home Location provider:work or home office Persons participating in the virtual visit: patient, provider,pts daughter  I discussed the limitations of evaluation and management by telemedicine and the availability of in person appointments. The patient expressed understanding and agreed to proceed.   HPI: 1. Daughter and patient c/w short term memory issues and daughter reports pt has to repeat herself at times 3 x her mom is in her 30s or older and memory issues started around this age but she feels this is too early 2. Mood PHQ 9 score 4 today and anxiety controlled f/u with Dr. Shea Evans will CC her about memory issues  Reviewed ambien 5 mg qd could have side effect of memory loss but she needs this for sleep  3. HTN improved on losartan 50 mg qd and propranolol er 60 mg qd per pt and daughter BP is <140 on the top    ROS: See pertinent positives and negatives per HPI.  Past Medical History:  Diagnosis Date  . Allergy   . Anxiety   . Bell's palsy    left  . Chronic back pain   . Family history of adverse reaction to anesthesia    pts daughter had severe vomiting 2014   . Hip joint pain    left  . Hypertension   . Osteoarthritis   . PAF (paroxysmal atrial fibrillation) ([facility])    a. initially diagnosed 04/2014; b. on Eliquis; c. CHADS2VASc = 3  . Palpitations   . Wears contact lenses   . Wears dentures    partial upper    Past Surgical History:  Procedure Laterality Date  . CHOLECYSTECTOMY    . COLONOSCOPY WITH PROPOFOL N/A 11/05/2014   Procedure: COLONOSCOPY WITH PROPOFOL;  Surgeon: Lucilla Lame, MD;  Location: [facility];  Service: Endoscopy;  Laterality: N/A;  . JOINT REPLACEMENT     left knee x 2 04/2024 and 01/2017 Dr. Rudene Christians   . KNEE  ARTHROSCOPY Left 06/28/2014   Procedure: ARTHROSCOPY KNEE WITH PARTIALLATERAL MENISECTOMY AND DEBRIDEMENT;  Surgeon: Susa Day, MD;  [facility];  Service: Orthopedics;  Laterality: Left;  . TOOTH EXTRACTION    . 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 TOTAL KNEE REVISION Left 02/11/2017   Procedure: TOTAL KNEE REVISION, POLYETHELENE EXCHANGE;  Surgeon: Hessie Knows, MD;  Location: [facility];  Service: Orthopedics;  Laterality: Left;    Family History  Problem Relation Age of Onset  . Heart disease Mother   . Hypertension Mother   . Mental illness Mother   . Diabetes Mother   . Heart disease Father   . Hypertension Sister   . Depression Sister   . Diabetes Sister   . Heart disease Brother   . Hypertension Brother   . Arthritis Daughter   . GER disease Daughter   . Hypothyroidism Daughter   . Lupus Daughter   . Depression Daughter   . Cancer Other        colon cancer, breast cancer   . Heart disease Other   . Breast cancer Other   . Depression Other     SOCIAL HX:  2 daughters 1 lives  [place] the other [place] pt visits both  Lives in [place] with daughter and grand daughter   Current Outpatient Medications:  .  acetaminophen (TYLENOL) 500 MG tablet, Take 1,000 mg every 8 (eight) hours as needed by mouth for mild pain., Disp: , Rfl:  .  Calcium Carbonate-Vitamin D 600-400 MG-UNIT tablet, Take by mouth., Disp: , Rfl:  .  Cholecalciferol (VITAMIN D) 2000 units tablet, Take 2,000 Units by mouth daily., Disp: , Rfl:  .  ELIQUIS 5 MG TABS tablet, TAKE 1 TABLET (5 MG TOTAL) BY MOUTH 2 (TWO) TIMES DAILY., Disp: 60 tablet, Rfl: 6 .  flecainide (TAMBOCOR) 50 MG tablet, Take 1 tablet (50 mg total) by mouth 2 (two) times daily., Disp: 180 tablet, Rfl: 3 .  losartan (COZAAR) 50 MG tablet, Take 1 tablet (50 mg total) by mouth daily. At night, Disp:  90 tablet, Rfl: 3 .  propranolol ER (INDERAL LA) 60 MG 24 hr capsule, Take 1 capsule (60 mg total) by mouth daily., Disp: 90 capsule, Rfl: 3 .  zolpidem (AMBIEN) 5 MG tablet, Take 1 tablet (5 mg total) by mouth at bedtime as needed for sleep., Disp: 90 tablet, Rfl: 1  EXAM:  VITALS per patient if applicable:  GENERAL: alert, oriented, appears well and in no acute distress  HEENT: atraumatic, conjunttiva clear, no obvious abnormalities on inspection of external nose and ears  NECK: normal movements of the head and neck  LUNGS: on inspection no signs of respiratory distress, breathing rate appears normal, no obvious gross SOB, gasping or wheezing  CV: no obvious cyanosis  MS: moves all visible extremities without noticeable abnormality  PSYCH/NEURO: pleasant and cooperative, no obvious depression or anxiety, speech and thought processing grossly intact  ASSESSMENT AND PLAN:  Discussed the following assessment and plan:  Memory loss -needs further testing with psychiatry to see if qualifies for aricept/namenda or other treatment  B12 normal  Mood per pt controlled and PHQ 9 score 4 today  Disc for now can consider memory strategies mailed and otc prevagen  Essential hypertension - Plan: Comprehensive metabolic panel, CBC with Differential/Platelet, Lipid panel  MDD (major depressive disorder), single episode, moderate ([facility]) GAD (generalized anxiety disorder) Insomnia due to mental disorder -cont meds and f/u psych Dr. Shea Evans  Hematuria, unspecified type - Plan: Urinalysis, Routine w reflex microscopic, Urine Culture  HM sch fasting labs Had flu shot utd  had, pna vx x 2 consider repeat pna 23 when it has been 5 years 11/09/2019  zoster consider shingrix in futuregiven Rx prev has not yet had  -prevnar 01/28/17, pna 23 11/09/14 confirmed, Tdap 01/06/13   Pap no h/o abnormal no GU blood has not had in a while though typically stop age 18.  Colonoscopy last 11/05/14  diverticulosis/hemorrhoids repeat in 10 years also consider cologuard mammo normal 5/2015mammo ordered  -call to schedule   dexa osteoporosis 07/2017 on prolia had shot recently 04/28/18 and 11/01/18 f/u q6 months -will repeat dEXA after 04/2019 Never smoker  Skin- as of 02/16/2019 no issues  -we discussed possible serious and likely etiologies, options for evaluation and workup, limitations of telemedicine visit vs in person visit, treatment, treatment risks and precautions. Pt prefers to treat via telemedicine empirically rather then risking or undertaking an in person visit at this moment. Patient agrees to seek prompt in person care if worsening, new symptoms arise, or if is not improving with treatment.   I discussed the assessment and treatment plan with the patient. The patient was provided  an opportunity to ask questions and all were answered. The patient agreed with the plan and demonstrated an understanding of the instructions.   The patient was advised to call back or seek an in-person evaluation if the symptoms worsen or if the condition fails to improve as anticipated.  Time spent 25 minutes  Delorise Jackson, MD

## 2019-02-20 ENCOUNTER — Encounter: Payer: Self-pay | Admitting: Internal Medicine

## 2019-02-20 NOTE — Progress Notes (Signed)
Mailed to patient with [facility]'s number.

## 2019-02-20 NOTE — Patient Instructions (Addendum)
Prevagen over the counter supplement  You will need to follow up with Dr. Shea Evans to have your memory tested further    Please call to schedule your mammogram and DEXA DEXA please schedule after 04/29/2019 at [facility]  1. Use "WARM" strategy.  W= write it down  A= associate it  R= repeat it  M= make a mental note  2.   You can keep a Social worker.  Use a 3-ring notebook with sections for the following: calendar, important names and phone numbers,  medications, doctors' names/phone numbers, lists/reminders, and a section to journal what you did  each day.   3.    Use a calendar to write appointments down.  4.    Write yourself a schedule for the day.  This can be placed on the calendar or in a separate section of the Memory Notebook.  Keeping a  regular schedule can help memory.  5.    Use medication organizer with sections for each day or morning/evening pills.  You may need help loading it  6.    Keep a basket, or pegboard by the door.  Place items that you need to take out with you in the basket or on the pegboard.  You may also want to  include a message board for reminders.  7.    Use sticky notes.  Place sticky notes with reminders in a place where the task is performed.  For example: " turn off the  stove" placed by the stove, "lock the door" placed on the door at eye level, " take your medications" on  the bathroom mirror or by the place where you normally take your medications.  8.    Use alarms/timers.  Use while cooking to remind yourself to check on food or as a reminder to take your medicine, or as a  reminder to make a call, or as a reminder to perform another task, etc.

## 2019-02-20 NOTE — Progress Notes (Signed)
Yes , I will do what I can. Please also refer her to neurology. Thanks

## 2019-02-20 NOTE — Addendum Note (Signed)
Addended by: Orland Mustard on: 02/20/2019 02:52 PM   Modules accepted: Orders

## 2019-02-20 NOTE — Progress Notes (Signed)
I will see what I can do , but please also refer her to neurology.  Thank you .

## 2019-02-24 ENCOUNTER — Other Ambulatory Visit: Payer: Self-pay

## 2019-02-27 ENCOUNTER — Telehealth: Payer: Self-pay

## 2019-02-27 ENCOUNTER — Other Ambulatory Visit (INDEPENDENT_AMBULATORY_CARE_PROVIDER_SITE_OTHER): Payer: Medicare Other

## 2019-02-27 ENCOUNTER — Other Ambulatory Visit: Payer: Self-pay

## 2019-02-27 DIAGNOSIS — I10 Essential (primary) hypertension: Secondary | ICD-10-CM | POA: Diagnosis not present

## 2019-02-27 DIAGNOSIS — R319 Hematuria, unspecified: Secondary | ICD-10-CM

## 2019-02-27 LAB — COMPREHENSIVE METABOLIC PANEL
ALT: 10 U/L (ref 0–35)
AST: 16 U/L (ref 0–37)
Albumin: 4.1 g/dL (ref 3.5–5.2)
Alkaline Phosphatase: 71 U/L (ref 39–117)
BUN: 20 mg/dL (ref 6–23)
CO2: 32 mEq/L (ref 19–32)
Calcium: 9 mg/dL (ref 8.4–10.5)
Chloride: 102 mEq/L (ref 96–112)
Creatinine, Ser: 0.89 mg/dL (ref 0.40–1.20)
GFR: 61.74 mL/min (ref >60.00)
Glucose, Bld: 75 mg/dL (ref 70–99)
Potassium: 3.9 mEq/L (ref 3.5–5.1)
Sodium: 139 mEq/L (ref 135–145)
Total Bilirubin: 1.1 mg/dL (ref 0.2–1.2)
Total Protein: 6.7 g/dL (ref 6.0–8.3)

## 2019-02-27 LAB — CBC WITH DIFFERENTIAL/PLATELET
Basophils Absolute: 0 10*3/uL (ref 0.0–0.1)
Basophils Relative: 0.6 % (ref 0.0–3.0)
Eosinophils Absolute: 0.2 10*3/uL (ref 0.0–0.7)
Eosinophils Relative: 2.9 % (ref 0.0–5.0)
HCT: 38.7 % (ref 36.0–46.0)
Hemoglobin: 12.7 g/dL (ref 12.0–15.0)
Lymphocytes Relative: 24.2 % (ref 12.0–46.0)
Lymphs Abs: 1.5 10*3/uL (ref 0.7–4.0)
MCHC: 32.8 g/dL (ref 30.0–36.0)
MCV: 93.9 fl (ref 78.0–100.0)
Monocytes Absolute: 0.5 10*3/uL (ref 0.1–1.0)
Monocytes Relative: 7.7 % (ref 3.0–12.0)
Neutro Abs: 4 10*3/uL (ref 1.4–7.7)
Neutrophils Relative %: 64.6 % (ref 43.0–77.0)
Platelets: 214 10*3/uL (ref 150.0–400.0)
RBC: 4.12 Mil/uL (ref 3.87–5.11)
RDW: 13.8 % (ref 11.5–15.5)
WBC: 6.2 10*3/uL (ref 4.0–10.5)

## 2019-02-27 LAB — LIPID PANEL
Cholesterol: 223 mg/dL — ABNORMAL HIGH (ref 0–200)
HDL: 72.1 mg/dL (ref >39.00)
LDL Cholesterol: 135 mg/dL — ABNORMAL HIGH (ref 0–99)
NonHDL: 151.11
Total CHOL/HDL Ratio: 3
Triglycerides: 81 mg/dL (ref 0.0–149.0)
VLDL: 16.2 mg/dL (ref 0.0–40.0)

## 2019-02-27 NOTE — Telephone Encounter (Signed)
Called to tell pt to RTC to provide more urine- not enough provided initially. Pt.agreed.

## 2019-02-28 LAB — URINALYSIS, ROUTINE W REFLEX MICROSCOPIC
Bilirubin Urine: NEGATIVE
Glucose, UA: NEGATIVE
Hgb urine dipstick: NEGATIVE
Ketones, ur: NEGATIVE
Leukocytes,Ua: NEGATIVE
Nitrite: NEGATIVE
Protein, ur: NEGATIVE
Specific Gravity, Urine: 1.016 (ref 1.001–1.03)
pH: 6.5 (ref 5.0–8.0)

## 2019-02-28 LAB — URINE CULTURE
MICRO NUMBER:: 1194546
SPECIMEN QUALITY:: ADEQUATE

## 2019-03-06 ENCOUNTER — Other Ambulatory Visit: Payer: Self-pay | Admitting: Internal Medicine

## 2019-03-06 DIAGNOSIS — E785 Hyperlipidemia, unspecified: Secondary | ICD-10-CM

## 2019-03-06 MED ORDER — PRAVASTATIN SODIUM 20 MG PO TABS: 20.0000 mg | ORAL_TABLET | Freq: Every day | ORAL | 3 refills | Status: DC

## 2019-03-15 ENCOUNTER — Other Ambulatory Visit: Payer: Self-pay | Admitting: Internal Medicine

## 2019-03-15 DIAGNOSIS — G4700 Insomnia, unspecified: Secondary | ICD-10-CM

## 2019-03-15 MED ORDER — ZOLPIDEM TARTRATE 5 MG PO TABS: 5.0000 mg | ORAL_TABLET | Freq: Every evening | ORAL | 1 refills | Status: DC | PRN

## 2019-03-29 DIAGNOSIS — G250 Essential tremor: Secondary | ICD-10-CM | POA: Diagnosis not present

## 2019-03-29 DIAGNOSIS — R413 Other amnesia: Secondary | ICD-10-CM | POA: Diagnosis not present

## 2019-03-29 DIAGNOSIS — G608 Other hereditary and idiopathic neuropathies: Secondary | ICD-10-CM | POA: Diagnosis not present

## 2019-04-25 ENCOUNTER — Ambulatory Visit
Admission: RE | Admit: 2019-04-25 | Discharge: 2019-04-25 | Disposition: A | Discharge status: Home / Self Care | Payer: Medicare Other | Source: Ambulatory Visit | Attending: Internal Medicine | Admitting: Internal Medicine

## 2019-04-25 DIAGNOSIS — Z1231 Encounter for screening mammogram for malignant neoplasm of breast: Secondary | ICD-10-CM | POA: Diagnosis not present

## 2019-05-01 ENCOUNTER — Telehealth: Payer: Self-pay | Admitting: *Deleted

## 2019-05-01 NOTE — Telephone Encounter (Signed)
Called to schedule Prolia injection , patient daughter said she did not have calender available to schedule will call back and schedule. Was advised patient due next 2 weeks. Please schedule Prolia injection.

## 2019-05-02 NOTE — Telephone Encounter (Signed)
Scheduled 05/17/19

## 2019-05-17 ENCOUNTER — Ambulatory Visit: Payer: Medicare Other | Attending: Internal Medicine

## 2019-05-17 ENCOUNTER — Ambulatory Visit: Payer: Medicare Other

## 2019-05-17 DIAGNOSIS — Z20822 Contact with and (suspected) exposure to covid-19: Secondary | ICD-10-CM

## 2019-05-18 LAB — NOVEL CORONAVIRUS, NAA: SARS-CoV-2, NAA: NOT DETECTED

## 2019-05-29 ENCOUNTER — Encounter: Payer: Self-pay | Admitting: Internal Medicine

## 2019-05-31 ENCOUNTER — Other Ambulatory Visit: Payer: Self-pay | Admitting: Internal Medicine

## 2019-05-31 DIAGNOSIS — E785 Hyperlipidemia, unspecified: Secondary | ICD-10-CM

## 2019-05-31 MED ORDER — EZETIMIBE 10 MG PO TABS: 10.0000 mg | ORAL_TABLET | Freq: Every day | ORAL | 3 refills | Status: DC

## 2019-06-06 ENCOUNTER — Other Ambulatory Visit: Payer: Self-pay | Admitting: Cardiovascular Disease

## 2019-06-06 NOTE — Telephone Encounter (Signed)
Eliquis 5mg  refill request received. Pt is 74 years old, weight-72.6kg, Crea-0.89 on 02/27/2019, Diagnosis-Afib, and last seen by Dr. Rockey Situ on 06/10/2018 via Telemedicine. Dose is appropriate based on dosing criteria. Will send in refill to requested pharmacy.

## 2019-06-06 NOTE — Telephone Encounter (Signed)
Refill request

## 2019-06-21 ENCOUNTER — Other Ambulatory Visit: Payer: Self-pay | Admitting: Internal Medicine

## 2019-06-21 DIAGNOSIS — I4891 Unspecified atrial fibrillation: Secondary | ICD-10-CM

## 2019-06-21 MED ORDER — PROPRANOLOL HCL ER 60 MG PO CP24: 60.0000 mg | ORAL_CAPSULE | Freq: Every day | ORAL | 3 refills | Status: DC

## 2019-06-22 ENCOUNTER — Telehealth: Payer: Self-pay | Admitting: Internal Medicine

## 2019-06-22 NOTE — Telephone Encounter (Signed)
Please advise 

## 2019-06-22 NOTE — Telephone Encounter (Signed)
Pt states that propranolol ER (INDERAL LA) 60 MG 24 hr capsule is not covered by her insurance and wants to know if there is something else that she can take.

## 2019-06-23 ENCOUNTER — Other Ambulatory Visit: Payer: Self-pay | Admitting: Internal Medicine

## 2019-06-23 DIAGNOSIS — I10 Essential (primary) hypertension: Secondary | ICD-10-CM

## 2019-06-23 DIAGNOSIS — G250 Essential tremor: Secondary | ICD-10-CM

## 2019-06-23 DIAGNOSIS — I4891 Unspecified atrial fibrillation: Secondary | ICD-10-CM

## 2019-06-23 DIAGNOSIS — I480 Paroxysmal atrial fibrillation: Secondary | ICD-10-CM

## 2019-06-23 MED ORDER — PROPRANOLOL HCL ER 60 MG PO CP24: 60.0000 mg | ORAL_CAPSULE | Freq: Every day | ORAL | 3 refills | Status: DC

## 2019-06-26 NOTE — Telephone Encounter (Signed)
Patient informed and verbalized understanding, she will call.

## 2019-06-26 NOTE — Telephone Encounter (Signed)
Call [facility] did propranolol LA (long acting) Rx go through she needs for Afib and essential tremor so coded it this way and resent it in  Thanks [facility]

## 2019-06-27 ENCOUNTER — Other Ambulatory Visit: Payer: Self-pay

## 2019-06-28 DIAGNOSIS — M810 Age-related osteoporosis without current pathological fracture: Secondary | ICD-10-CM | POA: Diagnosis not present

## 2019-06-28 DIAGNOSIS — R768 Other specified abnormal immunological findings in serum: Secondary | ICD-10-CM | POA: Diagnosis not present

## 2019-06-28 DIAGNOSIS — M546 Pain in thoracic spine: Secondary | ICD-10-CM | POA: Diagnosis not present

## 2019-06-28 DIAGNOSIS — M25461 Effusion, right knee: Secondary | ICD-10-CM | POA: Diagnosis not present

## 2019-06-29 ENCOUNTER — Other Ambulatory Visit: Payer: Self-pay

## 2019-06-29 ENCOUNTER — Encounter: Payer: Self-pay | Admitting: Internal Medicine

## 2019-06-29 ENCOUNTER — Telehealth: Payer: Self-pay | Admitting: Internal Medicine

## 2019-06-29 ENCOUNTER — Ambulatory Visit (INDEPENDENT_AMBULATORY_CARE_PROVIDER_SITE_OTHER): Payer: Medicare Other | Admitting: Internal Medicine

## 2019-06-29 VITALS — BP 130/80 | HR 64 | Temp 96.9°F | Ht 62.0 in | Wt 159.8 lb

## 2019-06-29 DIAGNOSIS — E559 Vitamin D deficiency, unspecified: Secondary | ICD-10-CM

## 2019-06-29 DIAGNOSIS — Z1329 Encounter for screening for other suspected endocrine disorder: Secondary | ICD-10-CM | POA: Diagnosis not present

## 2019-06-29 DIAGNOSIS — I10 Essential (primary) hypertension: Secondary | ICD-10-CM | POA: Diagnosis not present

## 2019-06-29 DIAGNOSIS — N852 Hypertrophy of uterus: Secondary | ICD-10-CM

## 2019-06-29 DIAGNOSIS — R109 Unspecified abdominal pain: Secondary | ICD-10-CM | POA: Diagnosis not present

## 2019-06-29 DIAGNOSIS — E785 Hyperlipidemia, unspecified: Secondary | ICD-10-CM | POA: Diagnosis not present

## 2019-06-29 DIAGNOSIS — J302 Other seasonal allergic rhinitis: Secondary | ICD-10-CM

## 2019-06-29 LAB — LIPID PANEL
Cholesterol: 191 mg/dL (ref 0–200)
HDL: 72.4 mg/dL (ref >39.00)
LDL Cholesterol: 105 mg/dL — ABNORMAL HIGH (ref 0–99)
NonHDL: 119.01
Total CHOL/HDL Ratio: 3
Triglycerides: 72 mg/dL (ref 0.0–149.0)
VLDL: 14.4 mg/dL (ref 0.0–40.0)

## 2019-06-29 LAB — CBC WITH DIFFERENTIAL/PLATELET
Basophils Absolute: 0 10*3/uL (ref 0.0–0.1)
Basophils Relative: 0.7 % (ref 0.0–3.0)
Eosinophils Absolute: 0.2 10*3/uL (ref 0.0–0.7)
Eosinophils Relative: 3.5 % (ref 0.0–5.0)
HCT: 36.5 % (ref 36.0–46.0)
Hemoglobin: 12.4 g/dL (ref 12.0–15.0)
Lymphocytes Relative: 29.5 % (ref 12.0–46.0)
Lymphs Abs: 1.6 10*3/uL (ref 0.7–4.0)
MCHC: 34 g/dL (ref 30.0–36.0)
MCV: 92.4 fl (ref 78.0–100.0)
Monocytes Absolute: 0.4 10*3/uL (ref 0.1–1.0)
Monocytes Relative: 7.6 % (ref 3.0–12.0)
Neutro Abs: 3.1 10*3/uL (ref 1.4–7.7)
Neutrophils Relative %: 58.7 % (ref 43.0–77.0)
Platelets: 213 10*3/uL (ref 150.0–400.0)
RBC: 3.94 Mil/uL (ref 3.87–5.11)
RDW: 14.5 % (ref 11.5–15.5)
WBC: 5.3 10*3/uL (ref 4.0–10.5)

## 2019-06-29 LAB — TSH: TSH: 3.25 u[IU]/mL (ref 0.35–4.50)

## 2019-06-29 LAB — COMPREHENSIVE METABOLIC PANEL
ALT: 13 U/L (ref 0–35)
AST: 19 U/L (ref 0–37)
Albumin: 4.3 g/dL (ref 3.5–5.2)
Alkaline Phosphatase: 87 U/L (ref 39–117)
BUN: 16 mg/dL (ref 6–23)
CO2: 33 mEq/L — ABNORMAL HIGH (ref 19–32)
Calcium: 9.1 mg/dL (ref 8.4–10.5)
Chloride: 101 mEq/L (ref 96–112)
Creatinine, Ser: 0.76 mg/dL (ref 0.40–1.20)
GFR: 74.01 mL/min (ref >60.00)
Glucose, Bld: 92 mg/dL (ref 70–99)
Potassium: 4 mEq/L (ref 3.5–5.1)
Sodium: 140 mEq/L (ref 135–145)
Total Bilirubin: 1.1 mg/dL (ref 0.2–1.2)
Total Protein: 6.7 g/dL (ref 6.0–8.3)

## 2019-06-29 LAB — VITAMIN D 25 HYDROXY (VIT D DEFICIENCY, FRACTURES): VITD: 56.05 ng/mL (ref 30.00–100.00)

## 2019-06-29 MED ORDER — LEVOCETIRIZINE DIHYDROCHLORIDE 5 MG PO TABS: 2.5000 mg | ORAL_TABLET | Freq: Every evening | ORAL | 3 refills | Status: DC | PRN

## 2019-06-29 MED ORDER — IPRATROPIUM BROMIDE 0.06 % NA SOLN: 2.0000 | Freq: Three times a day (TID) | NASAL | 12 refills | Status: DC

## 2019-06-29 MED ORDER — OLOPATADINE HCL 0.2 % OP SOLN: 1.0000 [drp] | Freq: Every day | OPHTHALMIC | 12 refills | Status: DC

## 2019-06-29 MED ORDER — SALINE SPRAY 0.65 % NA SOLN: 1.0000 | Freq: Every day | NASAL | 12 refills | Status: AC | PRN

## 2019-06-29 NOTE — Telephone Encounter (Signed)
Faxed Non-Formulary Exception form to [facility] for Propranolol HCI ER on 4.15.2021. Fax # [phone].

## 2019-06-29 NOTE — Telephone Encounter (Signed)
propranolol ER (INDERAL LA) 60 MG 24 hr capsule is approved by insurance and is good for a yearm

## 2019-06-29 NOTE — Patient Instructions (Addendum)
Rock tape  Tylenol  Petra Kuba wise tumeric/ginger/curcumin supplement  Lidocaine patch  xyzal 5 mg  Nasal saline  atrovent  pataday eye drops

## 2019-06-29 NOTE — Progress Notes (Signed)
Chief Complaint  Patient presents with  . Follow-up  . Allergies    watery eyes and a lot of nasal drainage, has histor of cardiac issues so has to consult a DR for any OTC medications.    F/u with daughter  1. Allergies c/o runny nose and eyes and wants meds to help  2. Daughter reports 1 month ago x on and off qd had moderate lower ab pain not constipated nothing tried and sister had breast and bladder cancer and she is c/w ab pain in mother  3 HTN controlled on losartan 50 mg and inderal la 60 mg qd  4. HLD on zetia will check fasting labs today  5. Memory loss aricept 5 mg is helping  Review of Systems  Constitutional: Negative for weight loss.  HENT: Negative for hearing loss.   Eyes: Negative for blurred vision.  Respiratory: Negative for shortness of breath.   Cardiovascular: Negative for chest pain.  Gastrointestinal: Positive for abdominal pain. Negative for nausea and vomiting.  Skin: Negative for rash.  Neurological: Negative for headaches.  Psychiatric/Behavioral: Positive for memory loss.   Past Medical History:  Diagnosis Date  . Allergy   . Anxiety   . Bell's palsy    left  . Chronic back pain   . Family history of adverse reaction to anesthesia    pts daughter had severe vomiting 2014   . Hip joint pain    left  . Hypertension   . Osteoarthritis   . PAF (paroxysmal atrial fibrillation) ([facility])    a. initially diagnosed 04/2014; b. on Eliquis; c. CHADS2VASc = 3  . Palpitations   . Wears contact lenses   . Wears dentures    partial upper   Past Surgical History:  Procedure Laterality Date  . CHOLECYSTECTOMY    . COLONOSCOPY WITH PROPOFOL N/A 11/05/2014   Procedure: COLONOSCOPY WITH PROPOFOL;  Surgeon: Lucilla Lame, MD;  Location: [place];  Service: Endoscopy;  Laterality: N/A;  . JOINT REPLACEMENT     left knee x 2 04/2024 and 01/2017 Dr. Rudene Christians   . KNEE ARTHROSCOPY Left 06/28/2014   Procedure: ARTHROSCOPY KNEE WITH PARTIALLATERAL MENISECTOMY AND  DEBRIDEMENT;  Surgeon: Susa Day, MD;  Location: [facility];  Service: Orthopedics;  Laterality: Left;  . TOOTH EXTRACTION    . 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 TOTAL KNEE REVISION Left 02/11/2017   Procedure: TOTAL KNEE REVISION, POLYETHELENE EXCHANGE;  Surgeon: Hessie Knows, MD;  Location: [facility];  Service: Orthopedics;  Laterality: Left;   Family History  Problem Relation Age of Onset  . Heart disease Mother   . Hypertension Mother   . Mental illness Mother   . Diabetes Mother   . Heart disease Father   . Hypertension Sister   . Depression Sister   . Diabetes Sister   . Breast cancer Sister 73  . Bladder Cancer Sister   . Heart disease Brother   . Hypertension Brother   . Arthritis Daughter   . GER disease Daughter   . Hypothyroidism Daughter   . Lupus Daughter   . Depression Daughter   . Cancer Other        colon cancer, breast cancer   . Heart disease Other   . Breast cancer Other   . Depression Other    Social History   Socioeconomic History  . Marital status:  Divorced    Spouse name: Not on file  . Number of children: 2  . Years of education: Not on file  . Highest education level: Not on file  Occupational History  . Not on file  Tobacco Use  . Smoking status: Never Smoker  . Smokeless tobacco: Never Used  Substance and Sexual Activity  . Alcohol use: No  . Drug use: No  . Sexual activity: Never  Other Topics Concern  . Not on file  Social History Narrative   2 daughters 1 lives [place] the other [place] pt visits both    Lives with daughter in [place] and grand daughter       Social Determinants of Health   Financial Resource Strain:   . Difficulty of Paying Living Expenses:   Food Insecurity:   . Worried About Charity fundraiser in the Last Year:   . Arboriculturist in the Last Year:   Transportation  Needs:   . Film/video editor (Medical):   Marland Kitchen Lack of Transportation (Non-Medical):   Physical Activity:   . Days of Exercise per Week:   . Minutes of Exercise per Session:   Stress:   . Feeling of Stress :   Social Connections:   . Frequency of Communication with Friends and Family:   . Frequency of Social Gatherings with Friends and Family:   . Attends Religious Services:   . Active Member of Clubs or Organizations:   . Attends Archivist Meetings:   Marland Kitchen Marital Status:   Intimate Partner Violence:   . Fear of Current or Ex-Partner:   . Emotionally Abused:   Marland Kitchen Physically Abused:   . Sexually Abused:    Current Meds  Medication Sig  . acetaminophen (TYLENOL) 500 MG tablet Take 1,000 mg every 8 (eight) hours as needed by mouth for mild pain.  Marland Kitchen apixaban (ELIQUIS) 5 MG TABS tablet Please call the office to make an appt  . Calcium Carbonate-Vitamin D 600-400 MG-UNIT tablet Take by mouth.  . Cholecalciferol (VITAMIN D) 2000 units tablet Take 2,000 Units by mouth daily.  Marland Kitchen donepezil (ARICEPT) 5 MG tablet Take 5 mg by mouth at bedtime.  Marland Kitchen ezetimibe (ZETIA) 10 MG tablet Take 1 tablet (10 mg total) by mouth daily.  . flecainide (TAMBOCOR) 50 MG tablet Take 1 tablet (50 mg total) by mouth 2 (two) times daily.  Marland Kitchen losartan (COZAAR) 50 MG tablet Take 1 tablet (50 mg total) by mouth daily. At night  . propranolol ER (INDERAL LA) 60 MG 24 hr capsule Take 1 capsule (60 mg total) by mouth daily.  Marland Kitchen zolpidem (AMBIEN) 5 MG tablet Take 1 tablet (5 mg total) by mouth at bedtime as needed for sleep.   Allergies  Allergen Reactions  . Pravastatin     Diarrhea/constipation per pt    . Fish Allergy Nausea And Vomiting, Swelling and Rash   Recent Results (from the past 2160 hour(s))  Novel Coronavirus, NAA ([facility])     Status: None   Collection Time: 05/17/19  2:37 PM   Specimen: Nasopharyngeal(NP) swabs in vial transport medium   NASOPHARYNGE  TESTING  Result Value Ref Range    SARS-CoV-2, NAA Not Detected Not Detected    Comment: This nucleic acid amplification test was developed and its performance characteristics determined by [employer]. Nucleic acid amplification tests include RT-PCR and TMA. This test has not been FDA cleared or approved. This test has been authorized by FDA under an  Emergency Use Authorization (EUA). This test is only authorized for the duration of time the declaration that circumstances exist justifying the authorization of the emergency use of in vitro diagnostic tests for detection of SARS-CoV-2 virus and/or diagnosis of COVID-19 infection under section 564(b)(1) of the Act, 21 U.S.C. PT:2852782) (1), unless the authorization is terminated or revoked sooner. When diagnostic testing is negative, the possibility of a false negative result should be considered in the context of a patient's recent exposures and the presence of clinical signs and symptoms consistent with COVID-19. An individual without symptoms of COVID-19 and who is not shedding SARS-CoV-2 virus wo uld expect to have a negative (not detected) result in this assay.    Objective  Body mass index is 29.23 kg/m. Wt Readings from Last 3 Encounters:  06/29/19 159 lb 12.8 oz (72.5 kg)  02/16/19 160 lb (72.6 kg)  11/10/18 160 lb 14.4 oz (73 kg)   Temp Readings from Last 3 Encounters:  06/29/19 (!) 96.9 F (36.1 C) (Temporal)  11/10/18 (!) 95.4 F (35.2 C) (Temporal)  06/07/18 98.3 F (36.8 C) (Oral)   BP Readings from Last 3 Encounters:  06/29/19 130/80  11/10/18 138/70  06/07/18 (!) 146/86   Pulse Readings from Last 3 Encounters:  06/29/19 64  11/10/18 61  06/07/18 63    Physical Exam Vitals and nursing note reviewed.  Constitutional:      Appearance: Normal appearance. She is well-developed and well-groomed.  HENT:     Head: Normocephalic and atraumatic.  Eyes:     Conjunctiva/sclera: Conjunctivae normal.     Pupils: Pupils are equal, round,  and reactive to light.  Cardiovascular:     Rate and Rhythm: Normal rate and regular rhythm.     Heart sounds: Normal heart sounds. No murmur.  Pulmonary:     Effort: Pulmonary effort is normal.     Breath sounds: Normal breath sounds.  Abdominal:     General: Abdomen is flat. Bowel sounds are normal.     Tenderness: There is no abdominal tenderness.  Skin:    General: Skin is warm and dry.  Neurological:     General: No focal deficit present.     Mental Status: She is alert and oriented to person, place, and time. Mental status is at baseline.     Gait: Gait normal.  Psychiatric:        Attention and Perception: Attention and perception normal.        Mood and Affect: Mood and affect normal.        Speech: Speech normal.        Behavior: Behavior normal. Behavior is cooperative.        Thought Content: Thought content normal.        Cognition and Memory: Cognition and memory normal.        Judgment: Judgment normal.     Assessment  Plan  Essential hypertension - Plan: Comprehensive metabolic panel, Lipid panel, CBC w/Diff Cont meds   Hyperlipidemia, unspecified hyperlipidemia type - Plan: Lipid panel Cont meds   Screening for thyroid disorder - Plan: TSH  Abdominal pain, unspecified abdominal location - Plan: Urinalysis, Routine w reflex microscopic, Urine Culture, CT Abdomen Pelvis Wo Contrast  Vitamin D deficiency - Plan: Vitamin D (25 hydroxy)  Seasonal allergic rhinitis, unspecified trigger - Plan: levocetirizine (XYZAL) 5 MG tablet, ipratropium (ATROVENT) 0.06 % nasal spray, sodium chloride (OCEAN) 0.65 % SOLN nasal spray, Olopatadine HCl 0.2 % SOLN   HM sch fasting labs  Had flu shotutd  had, pna vx x 2 consider repeat pna 23 when it has been 5 years 11/09/2019  zoster consider shingrix in futuregiven Rx prev has not yet had  -prevnar 01/28/17, pna 23 11/09/14 confirmed, Tdap 01/06/13  covid vx had 2/2   Pap no h/o abnormal no GU blood has not had in a  while though typically stop age 44.  Colonoscopy last 11/05/14 diverticulosis/hemorrhoids repeat in 10 years also consider cologuard mammo normal 04/25/19  dexa osteoporosis 07/2017 on prolia had shot recently 04/28/18 and 11/01/18 f/u q6 months -will repeat dEXA after 04/2019 Never smoker Skin- as of 02/16/2019 no issues  Provider: Dr. Olivia Mackie McLean-Scocuzza-Internal Medicine

## 2019-06-29 NOTE — Telephone Encounter (Signed)
Pt informed while in office today

## 2019-06-30 LAB — URINALYSIS, ROUTINE W REFLEX MICROSCOPIC
Bilirubin Urine: NEGATIVE
Glucose, UA: NEGATIVE
Hgb urine dipstick: NEGATIVE
Ketones, ur: NEGATIVE
Leukocytes,Ua: NEGATIVE
Nitrite: NEGATIVE
Protein, ur: NEGATIVE
Specific Gravity, Urine: 1.014 (ref 1.001–1.03)
pH: 5.5 (ref 5.0–8.0)

## 2019-06-30 LAB — URINE CULTURE
MICRO NUMBER:: 10367680
Result:: NO GROWTH
SPECIMEN QUALITY:: ADEQUATE

## 2019-07-03 ENCOUNTER — Other Ambulatory Visit: Payer: Self-pay

## 2019-07-03 ENCOUNTER — Ambulatory Visit
Admission: RE | Admit: 2019-07-03 | Discharge: 2019-07-03 | Disposition: A | Discharge status: Home / Self Care | Payer: Medicare Other | Source: Ambulatory Visit | Attending: Internal Medicine | Admitting: Internal Medicine

## 2019-07-03 ENCOUNTER — Other Ambulatory Visit: Payer: Self-pay | Admitting: Cardiovascular Disease

## 2019-07-03 ENCOUNTER — Ambulatory Visit: Payer: Medicare Other

## 2019-07-03 DIAGNOSIS — R109 Unspecified abdominal pain: Secondary | ICD-10-CM | POA: Diagnosis not present

## 2019-07-03 NOTE — Telephone Encounter (Signed)
Please schedule overdue 6 month F/U with Dr. Gollan. Thank you! 

## 2019-07-04 ENCOUNTER — Encounter: Payer: Self-pay | Admitting: Internal Medicine

## 2019-07-04 NOTE — Addendum Note (Signed)
Addended by: Orland Mustard on: 07/04/2019 02:18 PM   Modules accepted: Orders

## 2019-07-04 NOTE — Telephone Encounter (Signed)
-----   Message from Delorise Jackson, MD sent at 07/04/2019  9:09 AM EDT ----- Uterus enlarged rec see GYN  -is agreeable to referral ?  Arthritis low back and plaque build up in aorta

## 2019-07-18 NOTE — Progress Notes (Signed)
Date:  07/19/2019   ID:  Gina Frazier, DOB October 14, 1945, MRN [id number]  Patient Location:  [place] [address]   Provider location:   [facility], [place] office  PCP:  McLean-Scocuzza, Nino Glow, MD  Cardiologist:  No primary care provider on file.   Chief Complaint  Patient presents with  . office visit    Pt has no complaints. Meds verbally reviewed w/ pt.    History of Present Illness:    Gina Frazier is a 74 y.o. female with PMH of  Hypertension Evaluated in 2012 for acute hypertension in the dentist chair,  hospital with  atrial fibrillation on 04/28/2014, started on Cardizem, Lopressor converting to normal sinus rhythm (Prior episode 6 months prior) started on anticoagulation with eliquis 5 mg by mouth twice a day,   flecainide 50 mg twice a day, continued on beta blocker. She presents today for follow-up of her atrial fibrillation, hypertension, left chest pain  In follow-up today she reports that she feels relatively well Blood pressure high today but reports is typically well controlled at home, often [id number] systolic  Denies any arrhythmia, no atrial fib  Moved to third floor, walks the steps every day Trouble with her knees getting up and down the stairs, may need to move  CT ABD Aortic atherosclerosis. Images pulled up and discussed mild to moderate diffuse aortic atherosclerosis descending aorta  No regular walking program, daughter who presents with her today trying to get her to walk more  EKG personally reviewed by myself on todays visit Shows normal sinus rhythm with rate 58 bpm nonspecific T wave abnormality  Prior CV studies:   The following studies were reviewed today:  Echocardiogram from the hospital shows ejection fraction 60-65%, otherwise normal study   Past Medical History:  Diagnosis Date  . Allergy   . Anxiety   . Bell's palsy    left  . Chronic back pain   . Family history of adverse reaction to  anesthesia    pts daughter had severe vomiting 2014   . Hip joint pain    left  . Hypertension   . Osteoarthritis   . PAF (paroxysmal atrial fibrillation) ([facility])    a. initially diagnosed 04/2014; b. on Eliquis; c. CHADS2VASc = 3  . Palpitations   . Wears contact lenses   . Wears dentures    partial upper   Past Surgical History:  Procedure Laterality Date  . CHOLECYSTECTOMY    . COLONOSCOPY WITH PROPOFOL N/A 11/05/2014   Procedure: COLONOSCOPY WITH PROPOFOL;  Surgeon: Lucilla Lame, MD;  Location: [facility];  Service: Endoscopy;  Laterality: N/A;  . JOINT REPLACEMENT     left knee x 2 04/2024 and 01/2017 Dr. Rudene Christians   . KNEE ARTHROSCOPY Left 06/28/2014   Procedure: ARTHROSCOPY KNEE WITH PARTIALLATERAL MENISECTOMY AND DEBRIDEMENT;  Surgeon: Susa Day, MD;  Location: [facility];  Service: Orthopedics;  Laterality: Left;  . TOOTH EXTRACTION    . 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 TOTAL KNEE REVISION Left 02/11/2017   Procedure: TOTAL KNEE REVISION, POLYETHELENE EXCHANGE;  Surgeon: Hessie Knows, MD;  Location: [facility];  Service: Orthopedics;  Laterality: Left;     Current Meds  Medication Sig  . acetaminophen (TYLENOL) 500 MG tablet Take 1,000 mg every 8 (eight) hours as needed by mouth for mild  pain.  . apixaban (ELIQUIS) 5 MG TABS tablet Please call the office to make an appt  . Calcium Carbonate-Vitamin D 600-400 MG-UNIT tablet Take by mouth.  . Cholecalciferol (VITAMIN D) 2000 units tablet Take 2,000 Units by mouth daily.  Marland Kitchen donepezil (ARICEPT) 5 MG tablet Take 5 mg by mouth at bedtime.  Marland Kitchen ezetimibe (ZETIA) 10 MG tablet Take 1 tablet (10 mg total) by mouth daily.  . flecainide (TAMBOCOR) 50 MG tablet TAKE 1 TABLET (50 MG TOTAL) BY MOUTH 2 (TWO) TIMES DAILY.  Marland Kitchen ipratropium (ATROVENT) 0.06 % nasal spray Place 2 sprays into both nostrils 3  (three) times daily.  Marland Kitchen levocetirizine (XYZAL) 5 MG tablet Take 0.5-1 tablets (2.5-5 mg total) by mouth at bedtime as needed for allergies.  Marland Kitchen losartan (COZAAR) 50 MG tablet Take 1 tablet (50 mg total) by mouth daily. At night  . Olopatadine HCl 0.2 % SOLN Apply 1 drop to eye daily. Both  . propranolol ER (INDERAL LA) 60 MG 24 hr capsule Take 1 capsule (60 mg total) by mouth daily.  . sodium chloride (OCEAN) 0.65 % SOLN nasal spray Place 1-2 sprays into both nostrils daily as needed for congestion.  Marland Kitchen zolpidem (AMBIEN) 5 MG tablet Take 1 tablet (5 mg total) by mouth at bedtime as needed for sleep.     Allergies:   Pravastatin and Fish allergy   Social History   Tobacco Use  . Smoking status: Never Smoker  . Smokeless tobacco: Never Used  Substance Use Topics  . Alcohol use: No  . Drug use: No     Family Hx: The patient's family history includes Arthritis in her daughter; Bladder Cancer in her sister; Breast cancer in an other family member; Breast cancer (age of onset: 35) in her sister; Cancer in an other family member; Depression in her daughter, sister, and another family member; Diabetes in her mother and sister; GER disease in her daughter; Heart disease in her brother, father, mother, and another family member; Hypertension in her brother, mother, and sister; Hypothyroidism in her daughter; Lupus in her daughter; Mental illness in her mother.  ROS:   Please see the history of present illness.    Review of Systems  Constitutional: Negative.   Respiratory: Negative.   Cardiovascular: Negative.   Gastrointestinal: Negative.   Musculoskeletal: Negative.   Neurological: Negative.   Psychiatric/Behavioral: The patient is nervous/anxious.   All other systems reviewed and are negative.    Labs/Other Tests and Data Reviewed:    Recent Labs: 06/29/2019: ALT 13; BUN 16; Creatinine, Ser 0.76; Hemoglobin 12.4; Platelets 213.0; Potassium 4.0; Sodium 140; TSH 3.25   Recent Lipid Panel  Lab Results  Component Value Date/Time   CHOL 191 06/29/2019 11:46 AM   TRIG 72.0 06/29/2019 11:46 AM   HDL 72.40 06/29/2019 11:46 AM   CHOLHDL 3 06/29/2019 11:46 AM   LDLCALC 105 (H) 06/29/2019 11:46 AM    Wt Readings from Last 3 Encounters:  07/19/19 158 lb 6 oz (71.8 kg)  06/29/19 159 lb 12.8 oz (72.5 kg)  02/16/19 160 lb (72.6 kg)     Exam:    Vital Signs:  BP (!) 164/70 (BP Location: Left Arm, Patient Position: Sitting, Cuff Size: Normal)   Pulse (!) 58   Ht 5\' 2"  (1.575 m)   Wt 158 lb 6 oz (71.8 kg)   SpO2 98%   BMI 28.97 kg/m   Constitutional:  oriented to person, place, and time. No distress.  HENT:  Head: Grossly  normal Eyes:  no discharge. No scleral icterus.  Neck: No JVD, no carotid bruits  Cardiovascular: Regular rate and rhythm, no murmurs appreciated Pulmonary/Chest: Clear to auscultation bilaterally, no wheezes or rails Abdominal: Soft.  no distension.  no tenderness.  Musculoskeletal: Normal range of motion Neurological:  normal muscle tone. Coordination normal. No atrophy Skin: Skin warm and dry Psychiatric: normal affect, pleasant   ASSESSMENT & PLAN:    PAF (paroxysmal atrial fibrillation) (HCC) No changes to her medications, no recurrent arrhythmia  Essential hypertension Blood pressure well controlled at home, no changes to medications  Chest pain, unspecified type No chest pain, no further work-up at this time  Anxiety  recommend regular walking program  Aortic atherosclerosis Goal LDL less than 70, recommended she start Crestor 5 mg every other day to start with slow titration up to daily as tolerated Continue Zetia   Total encounter time more than 25 minutes  Greater than 50% was spent in counseling and coordination of care with the patient   Disposition: Follow-up in 12 months   Signed, [name], MD  07/19/2019 8:44 AM    [facility] [address] [place] [address], [place], [place]  [address]

## 2019-07-19 ENCOUNTER — Ambulatory Visit (INDEPENDENT_AMBULATORY_CARE_PROVIDER_SITE_OTHER): Payer: Medicare Other | Admitting: Cardiovascular Disease

## 2019-07-19 ENCOUNTER — Other Ambulatory Visit: Payer: Self-pay

## 2019-07-19 ENCOUNTER — Encounter: Payer: Self-pay | Admitting: Cardiovascular Disease

## 2019-07-19 VITALS — BP 164/70 | HR 58 | Ht 62.0 in | Wt 158.4 lb

## 2019-07-19 DIAGNOSIS — R079 Chest pain, unspecified: Secondary | ICD-10-CM | POA: Diagnosis not present

## 2019-07-19 DIAGNOSIS — F419 Anxiety disorder, unspecified: Secondary | ICD-10-CM

## 2019-07-19 DIAGNOSIS — I700 Atherosclerosis of aorta: Secondary | ICD-10-CM | POA: Diagnosis not present

## 2019-07-19 DIAGNOSIS — I480 Paroxysmal atrial fibrillation: Secondary | ICD-10-CM

## 2019-07-19 DIAGNOSIS — I10 Essential (primary) hypertension: Secondary | ICD-10-CM

## 2019-07-19 MED ORDER — ROSUVASTATIN CALCIUM 5 MG PO TABS
5.0000 mg | ORAL_TABLET | Freq: Every day | ORAL | 3 refills | Status: DC
Start: 2019-07-19 — End: 2019-07-24

## 2019-07-19 NOTE — Patient Instructions (Addendum)
Medication Instructions:  Crestor 5 mg daily  (ok to start every other day for 1-2 months)   If you need a refill on your cardiac medications before your next appointment, please call your pharmacy.    Lab work: No new labs needed   If you have labs (blood work) drawn today and your tests are completely normal, you will receive your results only by: Marland Kitchen MyChart Message (if you have MyChart) OR . A paper copy in the mail If you have any lab test that is abnormal or we need to change your treatment, we will call you to review the results.   Testing/Procedures: No new testing needed   Follow-Up: At [facility], you and your health needs are our priority.  As part of our continuing mission to provide you with exceptional heart care, we have created designated Provider Care Teams.  These Care Teams include your primary Cardiologist (physician) and Advanced Practice Providers (APPs -  Physician Assistants and Nurse Practitioners) who all work together to provide you with the care you need, when you need it.  . You will need a follow up appointment in 12 months  . Providers on your designated Care Team:   . [name], NP . [name], PA-C . [name], PA-C  Any Other Special Instructions Will Be Listed Below (If Applicable).  For educational health videos Log in to : [email or web address] Or : [email or web address], password : [email or web address]

## 2019-07-21 ENCOUNTER — Telehealth: Payer: Self-pay | Admitting: Internal Medicine

## 2019-07-21 NOTE — Telephone Encounter (Signed)
Cardiology Moonlighter Note  Returned page from patient. Page stated that patient having issues with hypertension despite taking her blood pressure medications. I contacted number in patient's chart as well as alternate number provided by paging operator. No response.   Marcie Mowers, MD Cardiology Fellow, PGY-7

## 2019-07-21 NOTE — Telephone Encounter (Signed)
Cardiology Moonlighter Note  Returned page from patient's daughter. Patient's SBP earlier was in the 190's. She took her 50mg  losartan at 7pm as she typically does. BP improved to 170's, but then up to 180's. Patient not feeling well. Feels dizzy, lightheaded. Took another 50mg  losartan around 10:30pm. Recheck BP at [id number] with systolic near [id number]. Patient still not feeling well. Having polyuria. She does not take any other BP meds.  I recommended patient come to ED for evaluation. She has taken maximum dose of her losartan and she is still having symptomatic hypertension. She will come to ED.   Marcie Mowers, MD Cardiology Fellow, PGY-7

## 2019-07-22 ENCOUNTER — Emergency Department: Payer: Medicare Other

## 2019-07-22 ENCOUNTER — Other Ambulatory Visit: Payer: Self-pay

## 2019-07-22 ENCOUNTER — Emergency Department
Admission: EM | Admit: 2019-07-22 | Discharge: 2019-07-22 | Disposition: A | Discharge status: Home / Self Care | Payer: Medicare Other | Attending: Student | Admitting: Student

## 2019-07-22 ENCOUNTER — Telehealth: Payer: Self-pay | Admitting: Cardiology

## 2019-07-22 DIAGNOSIS — Z6379 Other stressful life events affecting family and household: Secondary | ICD-10-CM | POA: Insufficient documentation

## 2019-07-22 DIAGNOSIS — R202 Paresthesia of skin: Secondary | ICD-10-CM | POA: Insufficient documentation

## 2019-07-22 DIAGNOSIS — Z96652 Presence of left artificial knee joint: Secondary | ICD-10-CM | POA: Insufficient documentation

## 2019-07-22 DIAGNOSIS — Z79899 Other long term (current) drug therapy: Secondary | ICD-10-CM | POA: Insufficient documentation

## 2019-07-22 DIAGNOSIS — R002 Palpitations: Secondary | ICD-10-CM | POA: Diagnosis not present

## 2019-07-22 DIAGNOSIS — Z7901 Long term (current) use of anticoagulants: Secondary | ICD-10-CM | POA: Insufficient documentation

## 2019-07-22 DIAGNOSIS — F419 Anxiety disorder, unspecified: Secondary | ICD-10-CM | POA: Insufficient documentation

## 2019-07-22 DIAGNOSIS — R42 Dizziness and giddiness: Secondary | ICD-10-CM | POA: Insufficient documentation

## 2019-07-22 DIAGNOSIS — I10 Essential (primary) hypertension: Secondary | ICD-10-CM | POA: Insufficient documentation

## 2019-07-22 DIAGNOSIS — J439 Emphysema, unspecified: Secondary | ICD-10-CM | POA: Diagnosis not present

## 2019-07-22 LAB — URINALYSIS, COMPLETE (UACMP) WITH MICROSCOPIC
Bilirubin Urine: NEGATIVE
Glucose, UA: NEGATIVE mg/dL
Ketones, ur: NEGATIVE mg/dL
Leukocytes,Ua: NEGATIVE
Nitrite: NEGATIVE
Protein, ur: 30 mg/dL — AB
Specific Gravity, Urine: 1.005 (ref 1.005–1.030)
pH: 8 (ref 5.0–8.0)

## 2019-07-22 LAB — BASIC METABOLIC PANEL
Anion gap: 6 (ref 5–15)
BUN: 22 mg/dL (ref 8–23)
CO2: 31 mmol/L (ref 22–32)
Calcium: 9.5 mg/dL (ref 8.9–10.3)
Chloride: 105 mmol/L (ref 98–111)
Creatinine, Ser: 0.74 mg/dL (ref 0.44–1.00)
GFR calc Af Amer: >60 mL/min (ref >60)
GFR calc non Af Amer: >60 mL/min (ref >60)
Glucose, Bld: 104 mg/dL — ABNORMAL HIGH (ref 70–99)
Potassium: 3.7 mmol/L (ref 3.5–5.1)
Sodium: 142 mmol/L (ref 135–145)

## 2019-07-22 LAB — CBC
HCT: 38.4 % (ref 36.0–46.0)
Hemoglobin: 12.7 g/dL (ref 12.0–15.0)
MCH: 30.5 pg (ref 26.0–34.0)
MCHC: 33.1 g/dL (ref 30.0–36.0)
MCV: 92.1 fL (ref 80.0–100.0)
Platelets: 199 10*3/uL (ref 150–400)
RBC: 4.17 MIL/uL (ref 3.87–5.11)
RDW: 13.2 % (ref 11.5–15.5)
WBC: 6.6 10*3/uL (ref 4.0–10.5)
nRBC: 0 % (ref 0.0–0.2)

## 2019-07-22 LAB — TROPONIN I (HIGH SENSITIVITY)
Troponin I (High Sensitivity): 10 ng/L (ref <18)
Troponin I (High Sensitivity): 16 ng/L (ref <18)

## 2019-07-22 NOTE — ED Triage Notes (Signed)
Pt accompanied by daughter complaints of elevated BP at home per pt's daughter 200/100, cardiologist recommended pt to come to ER

## 2019-07-22 NOTE — ED Triage Notes (Signed)
Pt states is here for htn and face tingling. Pt states she has been checking her blood pressure every hour today to monitor it. Pt states last took her blood pressure medication at 1900.

## 2019-07-22 NOTE — Telephone Encounter (Signed)
Patient's daughter called stating that her mother's BP has been running in the 190/70's.  Apparently was sent to the ER last night and was in afib and was eventually sent home.  Tonight she is having tingling of the lips and face and her BP is elevated again [id number] systolic.  I have instructed her to go back to the [facility] ER to be evaluated for possible CVA.  Patient's daughter agrees to take her to the ER now.

## 2019-07-22 NOTE — ED Provider Notes (Signed)
[facility] Emergency Department Provider Note  ____________________________________________   First MD Initiated Contact with Patient 07/22/19 [phone]     (approximate)  I have reviewed the triage vital signs and the nursing notes.  History  Chief Complaint Hypertension    HPI Gina Frazier is a 74 y.o. female hx of HTN, pAF, anxiety who presents to the ER for an episodes of heart pounding, dizziness/lightheadedness, and high blood pressure. Patient reports 2 episodes occurring this evening, each about 10 minutes, where she could feel her heart pounding. She had associated dizziness/lightheadedness and felt like she may pass out and could not walk steady. She and daughter checked her BP during these episodes and noticed it was elevated into [id number] systolic which concerned them. She took a 2nd dose of her home losartan.  No chest pain. No vomiting or diarrhea. No dysuria or hematuria. No difficulty w/ speech. No focal weakness or numbness. No syncope. Hx of similar episodes previously, but says her BP has never been high with those episodes like it was today. No longer asymptomatic in the ER.    Past Medical Hx Past Medical History:  Diagnosis Date  . Allergy   . Anxiety   . Bell's palsy    left  . Chronic back pain   . Family history of adverse reaction to anesthesia    pts daughter had severe vomiting 2014   . Hip joint pain    left  . Hypertension   . Osteoarthritis   . PAF (paroxysmal atrial fibrillation) ([place])    a. initially diagnosed 04/2014; b. on Eliquis; c. CHADS2VASc = 3  . Palpitations   . Wears contact lenses   . Wears dentures    partial upper    Problem List Patient Active Problem List   Diagnosis Date Noted  . Seasonal allergic rhinitis 06/29/2019  . Hyperlipidemia 06/29/2019  . Memory loss 02/16/2019  . MDD (major depressive disorder), single episode, moderate ([place]) 01/18/2019  . GAD (generalized anxiety disorder) 01/18/2019    . Right knee pain 06/07/2018  . 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 Osteoporosis 07/29/2017  . Orthostatic hypotension 07/05/2017  . Bradycardia 07/05/2017  . Essential tremor 03/31/2017  . Hip pain 03/31/2017  . Hoarseness of voice 03/31/2017  . Hyperglycemia 03/31/2017  . 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 Chronic pain of left knee 02/08/2017  . 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 Weight gain 03/18/2016  . Numbness 01/28/2016  . Chronic bilateral low back pain 12/12/2015  . Weakness   . Weight loss   . PAF (paroxysmal atrial fibrillation) ([place])   . Primary osteoarthritis of knee 05/02/2015  . Special screening for malignant neoplasms, colon   . Snoring 05/01/2014  . Anxiety 12/30/2010  . Preoperative cardiovascular examination 12/30/2010  . Hypertension 07/21/2010  . Chronic back pain 07/21/2010  . Vitamin D deficiency 07/21/2010  . SHORTNESS OF BREATH 05/01/2010  . OSTEOARTHROS UNSPEC GEN/LOC OTH SPEC SITES 04/18/2010  . KNEE PAIN, LEFT 04/18/2010    Past Surgical Hx Past Surgical History:  Procedure Laterality Date  . CHOLECYSTECTOMY    . COLONOSCOPY WITH PROPOFOL N/A 11/05/2014   Procedure: COLONOSCOPY WITH PROPOFOL;  Surgeon: Lucilla Lame, MD;  Location: [place];  Service: Endoscopy;  Laterality: N/A;  . JOINT REPLACEMENT     left knee x 2 04/2024 and 01/2017 Dr. Rudene Christians   . KNEE ARTHROSCOPY Left 06/28/2014   Procedure: ARTHROSCOPY KNEE WITH PARTIALLATERAL MENISECTOMY AND DEBRIDEMENT;  Surgeon: Susa Day, MD;  Location: [facility];  Service: Orthopedics;  Laterality: Left;  . TOOTH EXTRACTION    . 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 TOTAL KNEE REVISION Left 02/11/2017   Procedure: TOTAL KNEE REVISION, POLYETHELENE EXCHANGE;  Surgeon: Hessie Knows, MD;  Location: [facility];  Service:  Orthopedics;  Laterality: Left;    Medications Prior to Admission medications   Medication Sig Start Date End Date Taking? Authorizing Provider  acetaminophen (TYLENOL) 500 MG tablet Take 1,000 mg every 8 (eight) hours as needed by mouth for mild pain.    [provider]  apixaban (ELIQUIS) 5 MG TABS tablet Please call the office to make an appt 06/06/19   Minna Merritts, MD  Calcium Carbonate-Vitamin D 600-400 MG-UNIT tablet Take by mouth.    [provider]  Cholecalciferol (VITAMIN D) 2000 units tablet Take 2,000 Units by mouth daily.    [provider]  donepezil (ARICEPT) 5 MG tablet Take 5 mg by mouth at bedtime.    [provider]  ezetimibe (ZETIA) 10 MG tablet Take 1 tablet (10 mg total) by mouth daily. 05/31/19   McLean-Scocuzza, Nino Glow, MD  flecainide (TAMBOCOR) 50 MG tablet TAKE 1 TABLET (50 MG TOTAL) BY MOUTH 2 (TWO) TIMES DAILY. 07/04/19   Minna Merritts, MD  ipratropium (ATROVENT) 0.06 % nasal spray Place 2 sprays into both nostrils 3 (three) times daily. 06/29/19   McLean-Scocuzza, Nino Glow, MD  levocetirizine (XYZAL) 5 MG tablet Take 0.5-1 tablets (2.5-5 mg total) by mouth at bedtime as needed for allergies. 06/29/19   McLean-Scocuzza, Nino Glow, MD  losartan (COZAAR) 50 MG tablet Take 1 tablet (50 mg total) by mouth daily. At night 06/07/18   McLean-Scocuzza, Nino Glow, MD  Olopatadine HCl 0.2 % SOLN Apply 1 drop to eye daily. Both 06/29/19   McLean-Scocuzza, Nino Glow, MD  propranolol ER (INDERAL LA) 60 MG 24 hr capsule Take 1 capsule (60 mg total) by mouth daily. 06/23/19   McLean-Scocuzza, Nino Glow, MD  rosuvastatin (CRESTOR) 5 MG tablet Take 1 tablet (5 mg total) by mouth daily. 07/19/19   Minna Merritts, MD  sodium chloride (OCEAN) 0.65 % SOLN nasal spray Place 1-2 sprays into both nostrils daily as needed for congestion. 06/29/19   McLean-Scocuzza, Nino Glow, MD  zolpidem (AMBIEN) 5 MG tablet Take 1 tablet (5 mg total) by mouth at bedtime as needed  for sleep. 03/15/19   McLean-Scocuzza, Nino Glow, MD    Allergies Pravastatin and Fish allergy  Family Hx Family History  Problem Relation Age of Onset  . Heart disease Mother   . Hypertension Mother   . Mental illness Mother   . Diabetes Mother   . Heart disease Father   . Hypertension Sister   . Depression Sister   . Diabetes Sister   . Breast cancer Sister 42  . Bladder Cancer Sister   . Heart disease Brother   . Hypertension Brother   . Arthritis Daughter   . GER disease Daughter   . Hypothyroidism Daughter   . Lupus Daughter   . Depression Daughter   . Cancer Other        colon cancer, breast cancer   . Heart disease Other   . Breast cancer Other   . Depression Other     Social Hx Social History   Tobacco Use  .  Smoking status: Never Smoker  . Smokeless tobacco: Never Used  Substance Use Topics  . Alcohol use: No  . Drug use: No     Review of Systems  Constitutional: Negative for fever. Negative for chills. + elevated home BP reading Eyes: Negative for visual changes. ENT: Negative for sore throat. Cardiovascular: Negative for chest pain. + heart pounding Respiratory: Negative for shortness of breath. Gastrointestinal: Negative for nausea. Negative for vomiting.  Genitourinary: Negative for dysuria. Musculoskeletal: Negative for leg swelling. Skin: Negative for rash. Neurological: Negative for headaches.+ lightheadedness/dizzy   Physical Exam  Vital Signs: ED Triage Vitals [07/22/19 0044]  Enc Vitals Group     BP (!) 192/89     Pulse Rate 76     Resp 18     Temp 98.6 F (37 C)     Temp Source Oral     SpO2 99 %     Weight 158 lb (71.7 kg)     Height 5\' 2"  (1.575 m)     Head Circumference      Peak Flow      Pain Score 0     Pain Loc      Pain Edu?      Excl. in [place]?     Constitutional: Alert and oriented. Well appearing. NAD.  Head: Normocephalic. Atraumatic. Eyes: Conjunctivae clear. Sclera anicteric. Pupils equal and  symmetric. Nose: No masses or lesions. No congestion or rhinorrhea. Mouth/Throat: Wearing mask.  Neck: No stridor. Trachea midline.  Cardiovascular: Normal rate, regular rhythm. Extremities well perfused. Respiratory: Normal respiratory effort.  Lungs CTAB. Gastrointestinal: Soft. Non-distended. Non-tender.  Genitourinary: Deferred. Musculoskeletal: No lower extremity edema. No deformities. Neurologic:  Normal speech and language. No gross focal or lateralizing neurologic deficits are appreciated. Alert and oriented.  Face symmetric.  Tongue midline.  Cranial nerves II through XII intact. UE and LE strength 5/5 and symmetric. UE and LE SILT.  Skin: Skin is warm, dry and intact. No rash noted. Psychiatric: Mood and affect are appropriate for situation.  EKG  Personally reviewed and interpreted by myself.   Date: 07/22/19 Time: 0050 Rate: 75 Rhythm: sinus Axis: normal Intervals: WNL Q wave I, aVL Non-specific ST changes, no acute ischemic changes No STEMI    Radiology  Personally reviewed available imaging myself.   CT head - IMPRESSION:  Normal head CT. No acute intracranial abnormality identified.    Procedures  Procedure(s) performed (including critical care):  .1-3 Lead EKG Interpretation Performed by: Lilia Pro., MD Authorized by: Lilia Pro., MD     Interpretation: normal     ECG rate assessment: normal     Rhythm: sinus rhythm   Comments:     Indication: dizziness, heart pounding Impression: NSR, no evidence of acute arrhythmia      Initial Impression / Assessment and Plan / MDM / ED Course  74 y.o. female who presents to the ED for 2 episodes of heart pounding, dizziness/lightheadedness, and elevated BP at home  Ddx: arrhythmia, highest suspicion for symptomatic pAF, electrolyte abnormality, anemia, UTI, TIA, peripheral vertigo. BP improved in ED on recheck, consider perhaps episode of symptomatic HTN while at home, but no evidence for this  currently in ED. No AMS, neurological deficits to suggest HTN emergency.   Will plan for labs, imaging, cardiac monitoring  Work up essentially unremarkable. HS troponin x 2 negative. EKG w/o acute arrhythmia or ischemia. No evidence of arrhythmia while on cardiac monitoring. No UTI. CT head negative. She has ambulated in  the ED with steady gait and w/o recurrence of symptoms. Offered MRI imaging to further evaluate for central etiology (though low suspicion based on presentation) vs close outpatient follow up, which would be reasonable as she is asymptomatic and w/o recurrence of symptoms. Patient says she gets very anxious in MRIs and would need to be in an open MRI and would therefore prefer outpatient follow up with her PCP for further discussion of this. As noted, given she is asymptomatic and work up w/o actionable derangements, including CT head, feel this is reasonable. Daughter at bedside agreeable as well. Will proceed w/ discharge, advised PCP & cardiology follow up, given return precautions.    _______________________________   As part of my medical decision making I have reviewed available labs, radiology tests, reviewed old records/performed chart review, obtained additional history from family.    Final Clinical Impression(s) / ED Diagnosis  Final diagnoses:  Dizziness  Pounding heartbeat       Note:  This document was prepared using Dragon voice recognition software and may include unintentional dictation errors.   Lilia Pro., MD 07/22/19 [phone]

## 2019-07-22 NOTE — Discharge Instructions (Addendum)
Thank you for letting us take care of you in the emergency department today.   Please continue to take any regular, prescribed medications.   Please follow up with: Your primary care doctor to review your ER visit and follow up on your symptoms. Discuss with them getting a potential open MRI for further evaluation of your dizziness. Your cardiology doctor, in case your episodes were related to an irregular heart beat that may benefit from Holter monitor   Please return to the ER for any new or worsening symptoms.

## 2019-07-22 NOTE — Telephone Encounter (Signed)
Can we call to follow-up on emergency room visit from Saturday had high blood pressure Verify any medication changes, would make sure she is tracking her blood pressure at home 2 hours after taking her morning medication

## 2019-07-22 NOTE — ED Triage Notes (Addendum)
Patient reports checked her blood pressure at home and it was elevated.  Reports checked it because she was dizzy.  Patient reports she took her blood pressure medication at regular time 7:00 pm but took a 2nd dose of it (losartan at 10:30 pm.

## 2019-07-23 ENCOUNTER — Emergency Department: Payer: Medicare Other

## 2019-07-23 ENCOUNTER — Emergency Department
Admission: EM | Admit: 2019-07-23 | Discharge: 2019-07-23 | Disposition: A | Discharge status: Home / Self Care | Payer: Medicare Other | Source: Home / Self Care | Attending: Emergency Medicine | Admitting: Emergency Medicine

## 2019-07-23 DIAGNOSIS — I10 Essential (primary) hypertension: Secondary | ICD-10-CM

## 2019-07-23 DIAGNOSIS — J439 Emphysema, unspecified: Secondary | ICD-10-CM | POA: Diagnosis not present

## 2019-07-23 DIAGNOSIS — F419 Anxiety disorder, unspecified: Secondary | ICD-10-CM

## 2019-07-23 LAB — CBC WITH DIFFERENTIAL/PLATELET
Abs Immature Granulocytes: 0.01 10*3/uL (ref 0.00–0.07)
Basophils Absolute: 0 10*3/uL (ref 0.0–0.1)
Basophils Relative: 1 %
Eosinophils Absolute: 0.2 10*3/uL (ref 0.0–0.5)
Eosinophils Relative: 3 %
HCT: 39 % (ref 36.0–46.0)
Hemoglobin: 13.2 g/dL (ref 12.0–15.0)
Immature Granulocytes: 0 %
Lymphocytes Relative: 32 %
Lymphs Abs: 2.2 10*3/uL (ref 0.7–4.0)
MCH: 30.6 pg (ref 26.0–34.0)
MCHC: 33.8 g/dL (ref 30.0–36.0)
MCV: 90.5 fL (ref 80.0–100.0)
Monocytes Absolute: 0.5 10*3/uL (ref 0.1–1.0)
Monocytes Relative: 8 %
Neutro Abs: 3.8 10*3/uL (ref 1.7–7.7)
Neutrophils Relative %: 56 %
Platelets: 198 10*3/uL (ref 150–400)
RBC: 4.31 MIL/uL (ref 3.87–5.11)
RDW: 13.2 % (ref 11.5–15.5)
WBC: 6.7 10*3/uL (ref 4.0–10.5)
nRBC: 0 % (ref 0.0–0.2)

## 2019-07-23 LAB — COMPREHENSIVE METABOLIC PANEL
ALT: 14 U/L (ref 0–44)
AST: 22 U/L (ref 15–41)
Albumin: 4.1 g/dL (ref 3.5–5.0)
Alkaline Phosphatase: 83 U/L (ref 38–126)
Anion gap: 7 (ref 5–15)
BUN: 19 mg/dL (ref 8–23)
CO2: 30 mmol/L (ref 22–32)
Calcium: 9.2 mg/dL (ref 8.9–10.3)
Chloride: 103 mmol/L (ref 98–111)
Creatinine, Ser: 0.8 mg/dL (ref 0.44–1.00)
GFR calc Af Amer: >60 mL/min (ref >60)
GFR calc non Af Amer: >60 mL/min (ref >60)
Glucose, Bld: 98 mg/dL (ref 70–99)
Potassium: 3.6 mmol/L (ref 3.5–5.1)
Sodium: 140 mmol/L (ref 135–145)
Total Bilirubin: 1.5 mg/dL — ABNORMAL HIGH (ref 0.3–1.2)
Total Protein: 7.3 g/dL (ref 6.5–8.1)

## 2019-07-23 LAB — TROPONIN I (HIGH SENSITIVITY): Troponin I (High Sensitivity): 10 ng/L (ref <18)

## 2019-07-23 MED ORDER — LORAZEPAM 0.5 MG PO TABS
0.5000 mg | ORAL_TABLET | Freq: Once | ORAL | Status: AC
  Administered 2019-07-23: 0.5 mg via ORAL
  Filled 2019-07-23: qty 1

## 2019-07-23 MED ORDER — LORAZEPAM 0.5 MG PO TABS
0.5000 mg | ORAL_TABLET | Freq: Three times a day (TID) | ORAL | 0 refills | Status: DC | PRN
Start: 2019-07-23 — End: 2019-07-27

## 2019-07-23 NOTE — Telephone Encounter (Signed)
Two visits to the hospital over the weekend BP elevated, reported episodes of tachycardia Could be anxiety, was given ativan Could hold crestor for now (last med change) Would start diltiazem er 120 mg daily Check pressures, call us with numbers Also propronolol 20 TID PRN for breakthrough tachycardia

## 2019-07-23 NOTE — ED Provider Notes (Signed)
[facility] Emergency Department Provider Note   ____________________________________________   First MD Initiated Contact with Patient 07/23/19 [phone]     (approximate)  I have reviewed the triage vital signs and the nursing notes.   HISTORY  Chief Complaint Hypertension    HPI Gina Frazier is a 74 y.o. female who returns to the ED from home with a chief complaint of elevated blood pressure and face tingling.  Patient has a history of hypertension, paroxysmal A. fib on Eliquis who saw her cardiologist Dr. Rockey Situ several days ago.  He put her on an additional cholesterol agent and she states her blood pressure has been higher than baseline ever since.  Baseline BP 130s/80s.  She has not had any change in her antihypertensives which include losartan 50 mg daily, propranolol ER 60 mg daily.  Also takes flecainide.  She was seen in the ED last night for same and had an unremarkable work-up including CT head, 2 sets of troponins and UA.  She was offered an MRI of the brain but she prefers to have an open MRI and wanted to do that through her PCP.  Over the course of the day she has been checking her blood pressure every 30 minutes to 1 hour.  She last took her blood pressure medication at 7 PM.  Presents with face tingling and elevated blood pressures.  Denies slurred speech, extremity weakness or fall.  Denies fever, cough, chest pain, shortness of breath, headache, abdominal pain, nausea, vomiting, dizziness, diaphoresis.  She describes self as "a nervous wreck".  Stressors include caring for her elderly mother who is in declining health.       Past Medical History:  Diagnosis Date  . Allergy   . Anxiety   . Bell's palsy    left  . Chronic back pain   . Family history of adverse reaction to anesthesia    pts daughter had severe vomiting 2014   . Hip joint pain    left  . Hypertension   . Osteoarthritis   . PAF (paroxysmal atrial fibrillation) ([place])    a.  initially diagnosed 04/2014; b. on Eliquis; c. CHADS2VASc = 3  . Palpitations   . Wears contact lenses   . Wears dentures    partial upper    Patient Active Problem List   Diagnosis Date Noted  . Seasonal allergic rhinitis 06/29/2019  . Hyperlipidemia 06/29/2019  . Memory loss 02/16/2019  . MDD (major depressive disorder), single episode, moderate ([place]) 01/18/2019  . GAD (generalized anxiety disorder) 01/18/2019  . Right knee pain 06/07/2018  . 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 Osteoporosis 07/29/2017  . Orthostatic hypotension 07/05/2017  . Bradycardia 07/05/2017  . Essential tremor 03/31/2017  . Hip pain 03/31/2017  . Hoarseness of voice 03/31/2017  . Hyperglycemia 03/31/2017  . 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 Chronic pain of left knee 02/08/2017  . 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 Weight gain 03/18/2016  . Numbness 01/28/2016  . Chronic bilateral low back pain 12/12/2015  . Weakness   . Weight loss   . PAF (paroxysmal atrial fibrillation) ([place])   . Primary osteoarthritis of knee 05/02/2015  . Special screening for malignant neoplasms, colon   . Snoring 05/01/2014  . Anxiety 12/30/2010  . Preoperative cardiovascular examination 12/30/2010  . Hypertension 07/21/2010  . Chronic back pain 07/21/2010  . Vitamin D deficiency 07/21/2010  . SHORTNESS OF BREATH 05/01/2010  . OSTEOARTHROS UNSPEC GEN/LOC  OTH SPEC SITES 04/18/2010  . KNEE PAIN, LEFT 04/18/2010    Past Surgical History:  Procedure Laterality Date  . CHOLECYSTECTOMY    . COLONOSCOPY WITH PROPOFOL N/A 11/05/2014   Procedure: COLONOSCOPY WITH PROPOFOL;  Surgeon: Lucilla Lame, MD;  Location: [facility];  Service: Endoscopy;  Laterality: N/A;  . JOINT REPLACEMENT     left knee x 2 04/2024 and 01/2017 Dr. Rudene Christians   . KNEE ARTHROSCOPY Left 06/28/2014   Procedure: ARTHROSCOPY KNEE WITH PARTIALLATERAL MENISECTOMY AND DEBRIDEMENT;  Surgeon: Susa Day, MD;   Location: [facility];  Service: Orthopedics;  Laterality: Left;  . TOOTH EXTRACTION    . 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 TOTAL KNEE REVISION Left 02/11/2017   Procedure: TOTAL KNEE REVISION, POLYETHELENE EXCHANGE;  Surgeon: Hessie Knows, MD;  Location: [facility];  Service: Orthopedics;  Laterality: Left;    Prior to Admission medications   Medication Sig Start Date End Date Taking? Authorizing Provider  acetaminophen (TYLENOL) 500 MG tablet Take 1,000 mg every 8 (eight) hours as needed by mouth for mild pain.    [provider]  apixaban (ELIQUIS) 5 MG TABS tablet Please call the office to make an appt 06/06/19   Minna Merritts, MD  Calcium Carbonate-Vitamin D 600-400 MG-UNIT tablet Take by mouth.    [provider]  Cholecalciferol (VITAMIN D) 2000 units tablet Take 2,000 Units by mouth daily.    [provider]  donepezil (ARICEPT) 5 MG tablet Take 5 mg by mouth at bedtime.    [provider]  ezetimibe (ZETIA) 10 MG tablet Take 1 tablet (10 mg total) by mouth daily. 05/31/19   McLean-Scocuzza, Nino Glow, MD  flecainide (TAMBOCOR) 50 MG tablet TAKE 1 TABLET (50 MG TOTAL) BY MOUTH 2 (TWO) TIMES DAILY. 07/04/19   Minna Merritts, MD  ipratropium (ATROVENT) 0.06 % nasal spray Place 2 sprays into both nostrils 3 (three) times daily. 06/29/19   McLean-Scocuzza, Nino Glow, MD  levocetirizine (XYZAL) 5 MG tablet Take 0.5-1 tablets (2.5-5 mg total) by mouth at bedtime as needed for allergies. 06/29/19   McLean-Scocuzza, Nino Glow, MD  LORazepam (ATIVAN) 0.5 MG tablet Take 1 tablet (0.5 mg total) by mouth every 8 (eight) hours as needed for anxiety. 07/23/19   Paulette Blanch, MD  losartan (COZAAR) 50 MG tablet Take 1 tablet (50 mg total) by mouth daily. At night 06/07/18   McLean-Scocuzza, Nino Glow, MD  Olopatadine HCl 0.2 % SOLN Apply 1 drop to  eye daily. Both 06/29/19   McLean-Scocuzza, Nino Glow, MD  propranolol ER (INDERAL LA) 60 MG 24 hr capsule Take 1 capsule (60 mg total) by mouth daily. 06/23/19   McLean-Scocuzza, Nino Glow, MD  rosuvastatin (CRESTOR) 5 MG tablet Take 1 tablet (5 mg total) by mouth daily. 07/19/19   Minna Merritts, MD  sodium chloride (OCEAN) 0.65 % SOLN nasal spray Place 1-2 sprays into both nostrils daily as needed for congestion. 06/29/19   McLean-Scocuzza, Nino Glow, MD  zolpidem (AMBIEN) 5 MG tablet Take 1 tablet (5 mg total) by mouth at bedtime as needed for sleep. 03/15/19   McLean-Scocuzza, Nino Glow, MD    Allergies Pravastatin and Fish allergy  Family History  Problem Relation Age of Onset  . Heart disease Mother   . Hypertension Mother   . Mental illness Mother   . Diabetes Mother   .  Heart disease Father   . Hypertension Sister   . Depression Sister   . Diabetes Sister   . Breast cancer Sister 22  . Bladder Cancer Sister   . Heart disease Brother   . Hypertension Brother   . Arthritis Daughter   . GER disease Daughter   . Hypothyroidism Daughter   . Lupus Daughter   . Depression Daughter   . Cancer Other        colon cancer, breast cancer   . Heart disease Other   . Breast cancer Other   . Depression Other     Social History Social History   Tobacco Use  . Smoking status: Never Smoker  . Smokeless tobacco: Never Used  Substance Use Topics  . Alcohol use: No  . Drug use: No    Review of Systems  Constitutional: No fever/chills Eyes: No visual changes. ENT: No sore throat. Cardiovascular: Denies chest pain. Respiratory: Denies shortness of breath. Gastrointestinal: No abdominal pain.  No nausea, no vomiting.  No diarrhea.  No constipation. Genitourinary: Negative for dysuria. Musculoskeletal: Negative for back pain. Skin: Negative for rash. Neurological: Positive for face tingling.  Negative for headaches, focal weakness or numbness. Psychiatric:  Positive for stress and  anxiety.  ____________________________________________   PHYSICAL EXAM:  VITAL SIGNS: ED Triage Vitals  Enc Vitals Group     BP 07/22/19 2341 (!) 196/89     Pulse Rate 07/22/19 2341 65     Resp 07/22/19 2341 20     Temp 07/22/19 2341 97.9 F (36.6 C)     Temp Source 07/22/19 2341 Oral     SpO2 07/22/19 2341 100 %     Weight 07/22/19 2341 158 lb (71.7 kg)     Height 07/22/19 2341 5\' 2"  (1.575 m)     Head Circumference --      Peak Flow --      Pain Score 07/22/19 2346 0     Pain Loc --      Pain Edu? --      Excl. in [place]? --     Constitutional: Alert and oriented. Well appearing and in no acute distress. Eyes: Conjunctivae are normal. PERRL. EOMI. Head: Atraumatic. Nose: No congestion/rhinnorhea. Mouth/Throat: Mucous membranes are moist.  Oropharynx non-erythematous. Neck: No stridor.  No carotid bruits. Cardiovascular: Normal rate, regular rhythm. Grossly normal heart sounds.  Good peripheral circulation. Respiratory: Normal respiratory effort.  No retractions. Lungs CTAB. Gastrointestinal: Soft and nontender. No distention. No abdominal bruits. No CVA tenderness. Musculoskeletal: No lower extremity tenderness nor edema.  No joint effusions. Neurologic: Alert and oriented x3.  CN II-XII grossly intact.  Normal speech and language. No gross focal neurologic deficits are appreciated. No gait instability. Skin:  Skin is warm, dry and intact. No rash noted. Psychiatric: Mood and affect are mildly anxious. Speech and behavior are normal.  ____________________________________________   LABS (all labs ordered are listed, but only abnormal results are displayed)  Labs Reviewed  COMPREHENSIVE METABOLIC PANEL - Abnormal; Notable for the following components:      Result Value   Total Bilirubin 1.5 (*)    All other components within normal limits  CBC WITH DIFFERENTIAL/PLATELET  URINALYSIS, COMPLETE (UACMP) WITH MICROSCOPIC  TROPONIN I (HIGH SENSITIVITY)    ____________________________________________  EKG  ED ECG REPORT I, Erabella Kuipers J, the attending physician, personally viewed and interpreted this ECG.   Date: 07/23/2019  EKG Time: 2346  Rate: 61  Rhythm: normal EKG, normal sinus rhythm  Axis: Normal  Intervals:none  ST&T Change: Nonspecific  ____________________________________________  RADIOLOGY  ED MD interpretation: No acute cardiopulmonary process  Official radiology report(s): DG Chest 2 View  Result Date: 07/23/2019 CLINICAL DATA:  Hypertension, facial tingling EXAM: CHEST - 2 VIEW COMPARISON:  06/08/2017 FINDINGS: Frontal and lateral views of the chest demonstrate a stable cardiac silhouette. No airspace disease, effusion, or pneumothorax. Hyperinflation and background scarring consistent with emphysema. No acute bony abnormalities. IMPRESSION: 1. No acute intrathoracic process. 2. Emphysema. Electronically Signed   By: Randa Ngo M.D.   On: 07/23/2019 00:29    ____________________________________________   PROCEDURES  Procedure(s) performed (including Critical Care):  Procedures  NIH Stroke Scale  Interval: Baseline Time: 5:04 AM Person Administering Scale: Norelle Runnion J  Administer stroke scale items in the order listed. Record performance in each category after each subscale exam. Do not go back and change scores. Follow directions provided for each exam technique. Scores should reflect what the patient does, not what the clinician thinks the patient can do. The clinician should record answers while administering the exam and work quickly. Except where indicated, the patient should not be coached (i.e., repeated requests to patient to make a special effort).   1a  Level of consciousness: 0=alert; keenly responsive  1b. LOC questions:  0=Performs both tasks correctly  1c. LOC commands: 0=Performs both tasks correctly  2.  Best Gaze: 0=normal  3.  Visual: 0=No visual loss  4. Facial Palsy: 0=Normal  symmetric movement  5a.  Motor left arm: 0=No drift, limb holds 90 (or 45) degrees for full 10 seconds  5b.  Motor right arm: 0=No drift, limb holds 90 (or 45) degrees for full 10 seconds  6a. motor left leg: 0=No drift, limb holds 90 (or 45) degrees for full 10 seconds  6b  Motor right leg:  0=No drift, limb holds 90 (or 45) degrees for full 10 seconds  7. Limb Ataxia: 0=Absent  8.  Sensory: 0=Normal; no sensory loss  9. Best Language:  0=No aphasia, normal  10. Dysarthria: 0=Normal  11. Extinction and Inattention: 0=No abnormality  12. Distal motor function: 0=Normal   Total:   0   ____________________________________________   INITIAL IMPRESSION / ASSESSMENT AND PLAN / ED COURSE  As part of my medical decision making, I reviewed the following data within the [facility] notes reviewed and incorporated, Labs reviewed, EKG interpreted, Old chart reviewed, Radiograph reviewed and Notes from prior ED visits     Gina Frazier was evaluated in Emergency Department on 07/23/2019 for the symptoms described in the history of present illness. She was evaluated in the context of the global COVID-19 pandemic, which necessitated consideration that the patient might be at risk for infection with the SARS-CoV-2 virus that causes COVID-19. Institutional protocols and algorithms that pertain to the evaluation of patients at risk for COVID-19 are in a state of rapid change based on information released by regulatory bodies including the CDC and federal and state organizations. These policies and algorithms were followed during the patient's care in the ED.    74 year old female with hypertension who returns for elevated blood pressure and face tingling.  Differential diagnosis includes but is not limited to CVA, TIA, ACS, infectious, metabolic etiologies, etc.  I have personally reviewed patient's old chart and her most recent ED visit yesterday.  Repeat labs unremarkable; this her  third unremarkable troponin.  CT head obtained last night was unremarkable.  I offered again to obtain MRI of the brain which  patient declines as she would rather have this done through her PCP in an open MRI.  She notes that she skipped her Ambien last night and has only had 2 hours of sleep.  We discussed benefits of low-dose anxiolytic; will prescribe 0.5 mg Ativan for her to use as needed.  She will message her cardiologist for further instructions on whether or not to continue the new cholesterol medication and recommendations on blood pressure control.  Strict return precautions given.  Patient verbalizes understanding agrees with plan of care.      ____________________________________________   FINAL CLINICAL IMPRESSION(S) / ED DIAGNOSES  Final diagnoses:  Essential hypertension  Anxiety     ED Discharge Orders         Ordered    LORazepam (ATIVAN) 0.5 MG tablet  Every 8 hours PRN     07/23/19 0407           Note:  This document was prepared using Dragon voice recognition software and may include unintentional dictation errors.   Paulette Blanch, MD 07/23/19 [phone]

## 2019-07-23 NOTE — ED Notes (Signed)
Spoke with dr. Beather Arbour regarding pt's symptoms, md to place orders.

## 2019-07-23 NOTE — Discharge Instructions (Signed)
You may take Ativan 0.5mg  as needed for anxiety. Return to the ER for worsening symptoms, persistent vomiting, difficulty breathing or other concerns.

## 2019-07-24 ENCOUNTER — Telehealth: Payer: Self-pay | Admitting: Cardiovascular Disease

## 2019-07-24 MED ORDER — DILTIAZEM HCL ER COATED BEADS 120 MG PO CP24
120.0000 mg | ORAL_CAPSULE | Freq: Every day | ORAL | 3 refills | Status: DC
Start: 2019-07-24 — End: 2020-06-26

## 2019-07-24 MED ORDER — PROPRANOLOL HCL 20 MG PO TABS: 20.0000 mg | ORAL_TABLET | Freq: Three times a day (TID) | ORAL | 6 refills | Status: DC | PRN

## 2019-07-24 NOTE — Addendum Note (Signed)
Addended by: Valora Corporal on: 07/24/2019 10:40 AM   Modules accepted: Orders

## 2019-07-24 NOTE — Telephone Encounter (Signed)
Spoke with patients daughter per release form. Reviewed provider recommendations and requested that she please have her monitor blood pressures 2 hours after medications. She verbalized understanding of all recommendations and had no further questions at this time.

## 2019-07-24 NOTE — Telephone Encounter (Signed)
Spoke with patients daughter per release form. Reviewed other phone encounters and recommendations.

## 2019-07-24 NOTE — Telephone Encounter (Signed)
See other telephone notes. Medications sent in for patient and instructions for monitoring blood pressures.

## 2019-07-24 NOTE — Telephone Encounter (Signed)
Patient daughter wants to know if fu ed visit needed because she was just seen at the office.    Please advise

## 2019-07-27 ENCOUNTER — Telehealth: Payer: Self-pay | Admitting: *Deleted

## 2019-07-27 ENCOUNTER — Encounter: Payer: Self-pay | Admitting: Internal Medicine

## 2019-07-27 ENCOUNTER — Ambulatory Visit (INDEPENDENT_AMBULATORY_CARE_PROVIDER_SITE_OTHER): Payer: Medicare Other | Admitting: Internal Medicine

## 2019-07-27 ENCOUNTER — Other Ambulatory Visit: Payer: Self-pay | Admitting: Internal Medicine

## 2019-07-27 ENCOUNTER — Other Ambulatory Visit: Payer: Self-pay

## 2019-07-27 VITALS — BP 126/82 | HR 57 | Temp 97.3°F | Ht 62.0 in | Wt 153.0 lb

## 2019-07-27 DIAGNOSIS — I4891 Unspecified atrial fibrillation: Secondary | ICD-10-CM

## 2019-07-27 DIAGNOSIS — I10 Essential (primary) hypertension: Secondary | ICD-10-CM | POA: Diagnosis not present

## 2019-07-27 DIAGNOSIS — R269 Unspecified abnormalities of gait and mobility: Secondary | ICD-10-CM | POA: Diagnosis not present

## 2019-07-27 DIAGNOSIS — R42 Dizziness and giddiness: Secondary | ICD-10-CM | POA: Diagnosis not present

## 2019-07-27 DIAGNOSIS — N852 Hypertrophy of uterus: Secondary | ICD-10-CM | POA: Diagnosis not present

## 2019-07-27 NOTE — Telephone Encounter (Signed)
Spoke with patients daughter per release form and reviewed that she should continue those medications and scheduled her to come in for follow up visit on Monday. She verbalized understanding, confirmed appointment date and time, with no further questions at this time.

## 2019-07-27 NOTE — Telephone Encounter (Signed)
-----   Message from Delorise Jackson, MD sent at 07/27/2019 10:51 AM EDT ----- Pt and daughter wanting to know if will continue diltiazem and flecainide I was thinking yes but pt needs f/u after ED visit due to Afib Can you all reach out to them please?

## 2019-07-27 NOTE — Patient Instructions (Addendum)
Dr. Marcelline Mates [phone] call for cancellations    MRI ordered

## 2019-07-27 NOTE — Progress Notes (Signed)
Chief Complaint  Patient presents with  . Hospitalization Follow-up    Was seen for hypertension and AFib in the ER over the weekend.    ED f/u [facility] with daughter  1. Afib and HTN saw ED 5/8 for dizziness/anxiety and 5/9 for HTN cardiology added Dilt 120 24 hr and prn propranolol 20 mg tid prn already on propranolol 60 la and eliquis 5 mg qd, and flecainide pt wants to know if should be on dilt and flecainide  On losartan 50 mg qd  2. Dizziness and abnormal gait CT head 07/22/19 negative she has a cane to use at times  3. Enlarged uterus wants ob/gyn appt Dr. Marcelline Mates sooner than 10/18/19    Review of Systems  Constitutional: Negative for weight loss.  HENT: Negative for hearing loss.   Eyes: Negative for blurred vision.  Respiratory: Negative for shortness of breath.   Cardiovascular: Negative for chest pain.  Neurological: Negative for dizziness and headaches.  Psychiatric/Behavioral: The patient is nervous/anxious.    Past Medical History:  Diagnosis Date  . Allergy   . Anxiety   . Bell's palsy    left  . Chronic back pain   . Family history of adverse reaction to anesthesia    pts daughter had severe vomiting 2014   . Hip joint pain    left  . Hypertension   . Osteoarthritis   . PAF (paroxysmal atrial fibrillation) ([place])    a. initially diagnosed 04/2014; b. on Eliquis; c. CHADS2VASc = 3  . Palpitations   . Wears contact lenses   . Wears dentures    partial upper   Past Surgical History:  Procedure Laterality Date  . CHOLECYSTECTOMY    . COLONOSCOPY WITH PROPOFOL N/A 11/05/2014   Procedure: COLONOSCOPY WITH PROPOFOL;  Surgeon: Lucilla Lame, MD;  Location: [place];  Service: Endoscopy;  Laterality: N/A;  . JOINT REPLACEMENT     left knee x 2 04/2024 and 01/2017 Dr. Rudene Christians   . KNEE ARTHROSCOPY Left 06/28/2014   Procedure: ARTHROSCOPY KNEE WITH PARTIALLATERAL MENISECTOMY AND DEBRIDEMENT;  Surgeon: Susa Day, MD;  Location: [facility];  Service: Orthopedics;   Laterality: Left;  . TOOTH EXTRACTION    . 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 TOTAL KNEE REVISION Left 02/11/2017   Procedure: TOTAL KNEE REVISION, POLYETHELENE EXCHANGE;  Surgeon: Hessie Knows, MD;  Location: [facility];  Service: Orthopedics;  Laterality: Left;   Family History  Problem Relation Age of Onset  . Heart disease Mother   . Hypertension Mother   . Mental illness Mother   . Diabetes Mother   . Heart disease Father   . Hypertension Sister   . Depression Sister   . Diabetes Sister   . Breast cancer Sister 65  . Bladder Cancer Sister   . Heart disease Brother   . Hypertension Brother   . Arthritis Daughter   . GER disease Daughter   . Hypothyroidism Daughter   . Lupus Daughter   . Depression Daughter   . Cancer Other        colon cancer, breast cancer   . Heart disease Other   . Breast cancer Other   . Depression Other    Social History   Socioeconomic History  . Marital status: Divorced    Spouse name: Not on file  . Number of children: 2  . Years  of education: Not on file  . Highest education level: Not on file  Occupational History  . Not on file  Tobacco Use  . Smoking status: Never Smoker  . Smokeless tobacco: Never Used  Substance and Sexual Activity  . Alcohol use: No  . Drug use: No  . Sexual activity: Never  Other Topics Concern  . Not on file  Social History Narrative   2 daughters 1 lives [place] the other [place] pt visits both    Lives with daughter in [place] and grand daughter       Social Determinants of Health   Financial Resource Strain:   . Difficulty of Paying Living Expenses:   Food Insecurity:   . Worried About Charity fundraiser in the Last Year:   . Arboriculturist in the Last Year:   Transportation Needs:   . Film/video editor (Medical):   Marland Kitchen Lack of Transportation  (Non-Medical):   Physical Activity:   . Days of Exercise per Week:   . Minutes of Exercise per Session:   Stress:   . Feeling of Stress :   Social Connections:   . Frequency of Communication with Friends and Family:   . Frequency of Social Gatherings with Friends and Family:   . Attends Religious Services:   . Active Member of Clubs or Organizations:   . Attends Archivist Meetings:   Marland Kitchen Marital Status:   Intimate Partner Violence:   . Fear of Current or Ex-Partner:   . Emotionally Abused:   Marland Kitchen Physically Abused:   . Sexually Abused:    Current Meds  Medication Sig  . acetaminophen (TYLENOL) 500 MG tablet Take 1,000 mg every 8 (eight) hours as needed by mouth for mild pain.  Marland Kitchen apixaban (ELIQUIS) 5 MG TABS tablet Please call the office to make an appt  . Calcium Carbonate-Vitamin D 600-400 MG-UNIT tablet Take by mouth.  . Cholecalciferol (VITAMIN D) 2000 units tablet Take 2,000 Units by mouth daily.  Marland Kitchen diltiazem (CARDIZEM CD) 120 MG 24 hr capsule Take 1 capsule (120 mg total) by mouth daily.  Marland Kitchen donepezil (ARICEPT) 5 MG tablet Take 5 mg by mouth at bedtime.  Marland Kitchen ezetimibe (ZETIA) 10 MG tablet Take 1 tablet (10 mg total) by mouth daily.  . flecainide (TAMBOCOR) 50 MG tablet TAKE 1 TABLET (50 MG TOTAL) BY MOUTH 2 (TWO) TIMES DAILY.  Marland Kitchen ipratropium (ATROVENT) 0.06 % nasal spray Place 2 sprays into both nostrils 3 (three) times daily.  Marland Kitchen levocetirizine (XYZAL) 5 MG tablet Take 0.5-1 tablets (2.5-5 mg total) by mouth at bedtime as needed for allergies.  Marland Kitchen losartan (COZAAR) 50 MG tablet Take 1 tablet (50 mg total) by mouth daily. At night  . Olopatadine HCl 0.2 % SOLN Apply 1 drop to eye daily. Both  . propranolol (INDERAL) 20 MG tablet Take 1 tablet (20 mg total) by mouth 3 (three) times daily as needed (As needed for breakthrough tachycardia (fast heart rates)).  Marland Kitchen propranolol ER (INDERAL LA) 60 MG 24 hr capsule Take 1 capsule (60 mg total) by mouth daily.  . sodium chloride (OCEAN)  0.65 % SOLN nasal spray Place 1-2 sprays into both nostrils daily as needed for congestion.  Marland Kitchen zolpidem (AMBIEN) 5 MG tablet Take 1 tablet (5 mg total) by mouth at bedtime as needed for sleep.   Allergies  Allergen Reactions  . Pravastatin     Diarrhea/constipation per pt    . Fish Allergy Nausea And  Vomiting, Swelling and Rash   Recent Results (from the past 2160 hour(s))  Novel Coronavirus, NAA ([facility])     Status: None   Collection Time: 05/17/19  2:37 PM   Specimen: Nasopharyngeal(NP) swabs in vial transport medium   NASOPHARYNGE  TESTING  Result Value Ref Range   SARS-CoV-2, NAA Not Detected Not Detected    Comment: This nucleic acid amplification test was developed and its performance characteristics determined by [employer]. Nucleic acid amplification tests include RT-PCR and TMA. This test has not been FDA cleared or approved. This test has been authorized by FDA under an Emergency Use Authorization (EUA). This test is only authorized for the duration of time the declaration that circumstances exist justifying the authorization of the emergency use of in vitro diagnostic tests for detection of SARS-CoV-2 virus and/or diagnosis of COVID-19 infection under section 564(b)(1) of the Act, 21 U.S.C. [id number]) (1), unless the authorization is terminated or revoked sooner. When diagnostic testing is negative, the possibility of a false negative result should be considered in the context of a patient's recent exposures and the presence of clinical signs and symptoms consistent with COVID-19. An individual without symptoms of COVID-19 and who is not shedding SARS-CoV-2 virus wo uld expect to have a negative (not detected) result in this assay.   Comprehensive metabolic panel     Status: Abnormal   Collection Time: 06/29/19 11:46 AM  Result Value Ref Range   Sodium 140 135 - 145 mEq/L   Potassium 4.0 3.5 - 5.1 mEq/L   Chloride 101 96 - 112 mEq/L   CO2 33 (H) 19 - 32  mEq/L   Glucose, Bld 92 70 - 99 mg/dL   BUN 16 6 - 23 mg/dL   Creatinine, Ser 0.76 0.40 - 1.20 mg/dL   Total Bilirubin 1.1 0.2 - 1.2 mg/dL   Alkaline Phosphatase 87 39 - 117 U/L   AST 19 0 - 37 U/L   ALT 13 0 - 35 U/L   Total Protein 6.7 6.0 - 8.3 g/dL   Albumin 4.3 3.5 - 5.2 g/dL   GFR 74.01 >60.00 mL/min   Calcium 9.1 8.4 - 10.5 mg/dL  Lipid panel     Status: Abnormal   Collection Time: 06/29/19 11:46 AM  Result Value Ref Range   Cholesterol 191 0 - 200 mg/dL    Comment: ATP III Classification       Desirable:  < 200 mg/dL               Borderline High:  200 - 239 mg/dL          High:  > = 240 mg/dL   Triglycerides 72.0 0.0 - 149.0 mg/dL    Comment: Normal:  <150 mg/dLBorderline High:  150 - 199 mg/dL   HDL 72.40 >39.00 mg/dL   VLDL 14.4 0.0 - 40.0 mg/dL   LDL Cholesterol 105 (H) 0 - 99 mg/dL   Total CHOL/HDL Ratio 3     Comment:                Men          Women1/2 Average Risk     3.4          3.3Average Risk          5.0          4.42X Average Risk          9.6          7.13X Average Risk  15.0          11.0                       NonHDL 119.01     Comment: NOTE:  Non-HDL goal should be 30 mg/dL higher than patient's LDL goal (i.e. LDL goal of < 70 mg/dL, would have non-HDL goal of < 100 mg/dL)  CBC w/Diff     Status: None   Collection Time: 06/29/19 11:46 AM  Result Value Ref Range   WBC 5.3 4.0 - 10.5 K/uL   RBC 3.94 3.87 - 5.11 Mil/uL   Hemoglobin 12.4 12.0 - 15.0 g/dL   HCT 36.5 36.0 - 46.0 %   MCV 92.4 78.0 - 100.0 fl   MCHC 34.0 30.0 - 36.0 g/dL   RDW 14.5 11.5 - 15.5 %   Platelets 213.0 150.0 - 400.0 K/uL   Neutrophils Relative % 58.7 43.0 - 77.0 %   Lymphocytes Relative 29.5 12.0 - 46.0 %   Monocytes Relative 7.6 3.0 - 12.0 %   Eosinophils Relative 3.5 0.0 - 5.0 %   Basophils Relative 0.7 0.0 - 3.0 %   Neutro Abs 3.1 1.4 - 7.7 K/uL   Lymphs Abs 1.6 0.7 - 4.0 K/uL   Monocytes Absolute 0.4 0.1 - 1.0 K/uL   Eosinophils Absolute 0.2 0.0 - 0.7 K/uL    Basophils Absolute 0.0 0.0 - 0.1 K/uL  TSH     Status: None   Collection Time: 06/29/19 11:46 AM  Result Value Ref Range   TSH 3.25 0.35 - 4.50 uIU/mL  Urinalysis, Routine w reflex microscopic     Status: None   Collection Time: 06/29/19 11:46 AM  Result Value Ref Range   Color, Urine YELLOW YELLOW   APPearance CLEAR CLEAR   Specific Gravity, Urine 1.014 1.001 - 1.03   pH 5.5 5.0 - 8.0   Glucose, UA NEGATIVE NEGATIVE   Bilirubin Urine NEGATIVE NEGATIVE   Ketones, ur NEGATIVE NEGATIVE   Hgb urine dipstick NEGATIVE NEGATIVE   Protein, ur NEGATIVE NEGATIVE   Nitrite NEGATIVE NEGATIVE   Leukocytes,Ua NEGATIVE NEGATIVE  Urine Culture     Status: None   Collection Time: 06/29/19 11:46 AM   Specimen: Urine  Result Value Ref Range   MICRO NUMBER: [id number]    SPECIMEN QUALITY: Adequate    Sample Source NOT GIVEN    STATUS: FINAL    Result: No Growth   Vitamin D (25 hydroxy)     Status: None   Collection Time: 06/29/19 11:46 AM  Result Value Ref Range   VITD 56.05 30.00 - 100.00 ng/mL  Basic metabolic panel     Status: Abnormal   Collection Time: 07/22/19 12:53 AM  Result Value Ref Range   Sodium 142 135 - 145 mmol/L   Potassium 3.7 3.5 - 5.1 mmol/L   Chloride 105 98 - 111 mmol/L   CO2 31 22 - 32 mmol/L   Glucose, Bld 104 (H) 70 - 99 mg/dL    Comment: Glucose reference range applies only to samples taken after fasting for at least 8 hours.   BUN 22 8 - 23 mg/dL   Creatinine, Ser 0.74 0.44 - 1.00 mg/dL   Calcium 9.5 8.9 - 10.3 mg/dL   GFR calc non Af Amer >60 >60 mL/min   GFR calc Af Amer >60 >60 mL/min   Anion gap 6 5 - 15    Comment: Performed at [facility], [address] [place],  [place] [address]  CBC     Status: None   Collection Time: 07/22/19 12:53 AM  Result Value Ref Range   WBC 6.6 4.0 - 10.5 K/uL   RBC 4.17 3.87 - 5.11 MIL/uL   Hemoglobin 12.7 12.0 - 15.0 g/dL   HCT 38.4 36.0 - 46.0 %   MCV 92.1 80.0 - 100.0 fL   MCH 30.5 26.0 - 34.0 pg   MCHC  33.1 30.0 - 36.0 g/dL   RDW 13.2 11.5 - 15.5 %   Platelets 199 150 - 400 K/uL   nRBC 0.0 0.0 - 0.2 %    Comment: Performed at [facility], [address] [place], [place] [address]  Urinalysis, Complete w Microscopic     Status: Abnormal   Collection Time: 07/22/19 12:53 AM  Result Value Ref Range   Color, Urine COLORLESS (A) YELLOW   APPearance CLEAR (A) CLEAR   Specific Gravity, Urine 1.005 1.005 - 1.030   pH 8.0 5.0 - 8.0   Glucose, UA NEGATIVE NEGATIVE mg/dL   Hgb urine dipstick SMALL (A) NEGATIVE   Bilirubin Urine NEGATIVE NEGATIVE   Ketones, ur NEGATIVE NEGATIVE mg/dL   Protein, ur 30 (A) NEGATIVE mg/dL   Nitrite NEGATIVE NEGATIVE   Leukocytes,Ua NEGATIVE NEGATIVE   RBC / HPF 6-10 0 - 5 RBC/hpf   WBC, UA 0-5 0 - 5 WBC/hpf   Bacteria, UA RARE (A) NONE SEEN   Squamous Epithelial / LPF 0-5 0 - 5   Mucus PRESENT     Comment: Performed at [facility], [place], [place] [address]  Troponin I (High Sensitivity)     Status: None   Collection Time: 07/22/19 12:53 AM  Result Value Ref Range   Troponin I (High Sensitivity) 10 <18 ng/L    Comment: (NOTE) Elevated high sensitivity troponin I (hsTnI) values and significant  changes across serial measurements may suggest ACS but many other  chronic and acute conditions are known to elevate hsTnI results.  Refer to the "Links" section for chest pain algorithms and additional  guidance. Performed at [facility], [place], [place] [address]   Troponin I (High Sensitivity)     Status: None   Collection Time: 07/22/19  5:38 AM  Result Value Ref Range   Troponin I (High Sensitivity) 16 <18 ng/L    Comment: (NOTE) Elevated high sensitivity troponin I (hsTnI) values and significant  changes across serial measurements may suggest ACS but many other  chronic and acute conditions are known to elevate hsTnI results.  Refer to the "Links" section for chest pain algorithms and  additional  guidance. Performed at [facility], [address] [place],  [address]   CBC with Differential     Status: None   Collection Time: 07/22/19 11:51 PM  Result Value Ref Range   WBC 6.7 4.0 - 10.5 K/uL   RBC 4.31 3.87 - 5.11 MIL/uL   Hemoglobin 13.2 12.0 - 15.0 g/dL   HCT 39.0 36.0 - 46.0 %   MCV 90.5 80.0 - 100.0 fL   MCH 30.6 26.0 - 34.0 pg   MCHC 33.8 30.0 - 36.0 g/dL   RDW 13.2 11.5 - 15.5 %   Platelets 198 150 - 400 K/uL   nRBC 0.0 0.0 - 0.2 %   Neutrophils Relative % 56 %   Neutro Abs 3.8 1.7 - 7.7 K/uL   Lymphocytes Relative 32 %   Lymphs Abs 2.2 0.7 - 4.0 K/uL  Monocytes Relative 8 %   Monocytes Absolute 0.5 0.1 - 1.0 K/uL   Eosinophils Relative 3 %   Eosinophils Absolute 0.2 0.0 - 0.5 K/uL   Basophils Relative 1 %   Basophils Absolute 0.0 0.0 - 0.1 K/uL   Immature Granulocytes 0 %   Abs Immature Granulocytes 0.01 0.00 - 0.07 K/uL    Comment: Performed at [facility], [address] [place], [place] [address]  Comprehensive metabolic panel     Status: Abnormal   Collection Time: 07/23/19  1:14 AM  Result Value Ref Range   Sodium 140 135 - 145 mmol/L   Potassium 3.6 3.5 - 5.1 mmol/L   Chloride 103 98 - 111 mmol/L   CO2 30 22 - 32 mmol/L   Glucose, Bld 98 70 - 99 mg/dL    Comment: Glucose reference range applies only to samples taken after fasting for at least 8 hours.   BUN 19 8 - 23 mg/dL   Creatinine, Ser 0.80 0.44 - 1.00 mg/dL   Calcium 9.2 8.9 - 10.3 mg/dL   Total Protein 7.3 6.5 - 8.1 g/dL   Albumin 4.1 3.5 - 5.0 g/dL   AST 22 15 - 41 U/L   ALT 14 0 - 44 U/L   Alkaline Phosphatase 83 38 - 126 U/L   Total Bilirubin 1.5 (H) 0.3 - 1.2 mg/dL   GFR calc non Af Amer >60 >60 mL/min   GFR calc Af Amer >60 >60 mL/min   Anion gap 7 5 - 15    Comment: Performed at [facility], [place], [place] [address]  Troponin I (High Sensitivity)     Status: None   Collection Time: 07/23/19  1:14 AM  Result  Value Ref Range   Troponin I (High Sensitivity) 10 <18 ng/L    Comment: (NOTE) Elevated high sensitivity troponin I (hsTnI) values and significant  changes across serial measurements may suggest ACS but many other  chronic and acute conditions are known to elevate hsTnI results.  Refer to the "Links" section for chest pain algorithms and additional  guidance. Performed at [facility], [address] [place], [place] [address]    Objective  Body mass index is 27.98 kg/m. Wt Readings from Last 3 Encounters:  07/27/19 153 lb (69.4 kg)  07/22/19 158 lb (71.7 kg)  07/22/19 158 lb (71.7 kg)   Temp Readings from Last 3 Encounters:  07/27/19 (!) 97.3 F (36.3 C) (Temporal)  07/22/19 97.9 F (36.6 C) (Oral)  07/22/19 98.6 F (37 C) (Oral)   BP Readings from Last 3 Encounters:  07/27/19 126/82  07/23/19 (!) 155/62  07/22/19 (!) 155/68   Pulse Readings from Last 3 Encounters:  07/27/19 (!) 57  07/23/19 60  07/22/19 (!) 55    Physical Exam Vitals and nursing note reviewed.  Constitutional:      Appearance: Normal appearance. She is well-developed and well-groomed.  HENT:     Head: Normocephalic and atraumatic.  Eyes:     Conjunctiva/sclera: Conjunctivae normal.     Pupils: Pupils are equal, round, and reactive to light.  Cardiovascular:     Rate and Rhythm: Normal rate and regular rhythm.     Heart sounds: Normal heart sounds. No murmur.     Comments: In NSR  Skin:    General: Skin is warm and dry.  Neurological:     General: No focal deficit present.     Mental Status: She is alert and oriented to  person, place, and time. Mental status is at baseline.     Gait: Gait normal.  Psychiatric:        Attention and Perception: Attention and perception normal.        Mood and Affect: Mood and affect normal.        Speech: Speech normal.        Behavior: Behavior normal. Behavior is cooperative.        Thought Content: Thought content normal.        Cognition  and Memory: Cognition and memory normal.        Judgment: Judgment normal.     Assessment  Plan  Essential hypertension/Afib Needs f/u Cards Dr. Myer Peer for now  Cont meds  Prn propranolol  wanting to know if will continue diltiazem and flecainide I was thinking yes but pt needs f/u after ED visit due to Afib  Can you all reach out to them please?   Dizziness - Plan: MR Brain Wo Contrast CT head neg 07/22/19  Abnormal gait - Plan: MR Brain Wo Contrast Walks with cane at times  Consider h/h PT/ot after MRI per pt and daughter  Enlarged uterus Wants sooner appt dr. Marcelline Mates than 10/18/19 CC'ed ob/gyn   HM  See last note   Provider: Dr. Olivia Mackie McLean-Scocuzza-Internal Medicine

## 2019-07-28 ENCOUNTER — Telehealth: Payer: Self-pay | Admitting: Internal Medicine

## 2019-07-28 NOTE — Telephone Encounter (Signed)
Pls help with this MRI brain

## 2019-07-28 NOTE — Progress Notes (Signed)
Would stay on the diltiazem and the flecainide Stay on propranolol, losartan, Eliquis Keep an eye on blood pressure

## 2019-07-28 NOTE — Telephone Encounter (Signed)
Received a call from [facility] stating that the only order they received through the system was for a Head/Brain MRI but the paperwork sent states the patient also needs a brain MRA.   They are needing clarification on what testing needs to be done. From has been faxed back to [place] and placed on your desk.

## 2019-07-28 NOTE — Telephone Encounter (Signed)
I re faxed a new form with just MRI check it was an error.

## 2019-07-31 ENCOUNTER — Ambulatory Visit (INDEPENDENT_AMBULATORY_CARE_PROVIDER_SITE_OTHER): Payer: Medicare Other | Admitting: Family

## 2019-07-31 ENCOUNTER — Encounter: Payer: Self-pay | Admitting: Family

## 2019-07-31 ENCOUNTER — Other Ambulatory Visit: Payer: Self-pay

## 2019-07-31 VITALS — BP 138/66 | HR 58 | Ht 62.0 in | Wt 152.0 lb

## 2019-07-31 DIAGNOSIS — Z7901 Long term (current) use of anticoagulants: Secondary | ICD-10-CM

## 2019-07-31 DIAGNOSIS — R0683 Snoring: Secondary | ICD-10-CM

## 2019-07-31 DIAGNOSIS — F419 Anxiety disorder, unspecified: Secondary | ICD-10-CM | POA: Diagnosis not present

## 2019-07-31 DIAGNOSIS — I480 Paroxysmal atrial fibrillation: Secondary | ICD-10-CM | POA: Diagnosis not present

## 2019-07-31 DIAGNOSIS — I10 Essential (primary) hypertension: Secondary | ICD-10-CM | POA: Diagnosis not present

## 2019-07-31 MED ORDER — LOSARTAN POTASSIUM 50 MG PO TABS: 75.0000 mg | ORAL_TABLET | Freq: Every day | ORAL | 3 refills | Status: DC

## 2019-07-31 NOTE — Patient Instructions (Signed)
Medication Instructions:  Your physician has recommended you make the following change in your medication:   INCREASE Losartan to 75 mg daily (1.5 tablets)  *If you need a refill on your cardiac medications before your next appointment, please call your pharmacy*  Lab Work: No lab work today.   Testing/Procedures: Your EKG today shows sinus bradycardia with a rate of 58 bpm which is a slow but regular heart rhythm. This is a good result!  Follow-Up: At [facility], you and your health needs are our priority.  As part of our continuing mission to provide you with exceptional heart care, we have created designated Provider Care Teams.  These Care Teams include your primary Cardiologist (physician) and Advanced Practice Providers (APPs -  Physician Assistants and Nurse Practitioners) who all work together to provide you with the care you need, when you need it.  We recommend signing up for the patient portal called "MyChart".  Sign up information is provided on this After Visit Summary.  MyChart is used to connect with patients for Virtual Visits (Telemedicine).  Patients are able to view lab/test results, encounter notes, upcoming appointments, etc.  Non-urgent messages can be sent to your provider as well.   To learn more about what you can do with MyChart, go to [email or web address].    Your next appointment:   1 month(s)  The format for your next appointment:   Virtual Visit   Provider:   You may see [name], MD or one of the following Advanced Practice Providers on your designated Care Team:    [name], NP  [name], PA-C  [name], PA-C   Other Instructions   Call us in July if you want to see pulmonology about a sleep study to evaluate your snoring.   Check your blood pressure once per day at least 1-2 hours after taking your medications and keep a log.   If your blood pressure is consistently more than 130/80, please call our office. If  your blood pressure is consisently less than 110/70, call our office.

## 2019-07-31 NOTE — Progress Notes (Addendum)
Office Visit    Patient Name: Gina Frazier Date of Encounter: 07/31/2019  Primary Care Provider:  McLean-Scocuzza, Nino Glow, MD Primary Cardiologist:  [name], MD Electrophysiologist:  None   Chief Complaint    Gina Frazier is a 74 y.o. female with a hx of PAF, HTN, aortic atherosclerosis presents today for follow-up of BP after recent ED visit.  Past Medical History    Past Medical History:  Diagnosis Date  . Allergy   . Anxiety   . Bell's palsy    left  . Chronic back pain   . Family history of adverse reaction to anesthesia    pts daughter had severe vomiting 2014   . Hip joint pain    left  . Hypertension   . Osteoarthritis   . PAF (paroxysmal atrial fibrillation) ([place])    a. initially diagnosed 04/2014; b. on Eliquis; c. CHADS2VASc = 3  . Palpitations   . Wears contact lenses   . Wears dentures    partial upper   Past Surgical History:  Procedure Laterality Date  . CHOLECYSTECTOMY    . COLONOSCOPY WITH PROPOFOL N/A 11/05/2014   Procedure: COLONOSCOPY WITH PROPOFOL;  Surgeon: Lucilla Lame, MD;  Location: [facility];  Service: Endoscopy;  Laterality: N/A;  . JOINT REPLACEMENT     left knee x 2 04/2024 and 01/2017 Dr. Rudene Christians   . KNEE ARTHROSCOPY Left 06/28/2014   Procedure: ARTHROSCOPY KNEE WITH PARTIALLATERAL MENISECTOMY AND DEBRIDEMENT;  Surgeon: Susa Day, MD;  Location: [facility];  Service: Orthopedics;  Laterality: Left;  . TOOTH EXTRACTION    . 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 TOTAL KNEE REVISION Left 02/11/2017   Procedure: TOTAL KNEE REVISION, POLYETHELENE EXCHANGE;  Surgeon: Hessie Knows, MD;  Location: [facility];  Service: Orthopedics;  Laterality: Left;    Allergies  Allergies  Allergen Reactions  . Pravastatin     Diarrhea/constipation per pt    . Fish Allergy Nausea And Vomiting, Swelling  and Rash    History of Present Illness    Gina Frazier is a 74 y.o. female with a hx of PAF, HTN, aortic atherosclerosis last seen 07/19/2019 by Dr. Rockey Situ.  Her atrial fibrillation has well been controlled on flecainide and beta-blocker.  Echocardiogram 06/12/2015 with LVEF 60 to 65%, no significant valvular abnormalities.  Seen in clinic 07/19/2019 by Dr. Rockey Situ.  She was recommended to start Crestor 5 mg every other day and tolerated up to daily.  Her BP was elevated in clinic but she reported it was better controlled at home.  She contacted the after-hours line 07/21/2019 for elevated blood pressures.  SBP at home 190s, took her losartan 50 mg, BP improved to 170s then up to 180s and felt poorly.  Took another 50 mg of losartan with repeat systolic near [id number].  She was recommended to proceed to the ED for evaluation.  ED visit 07/22/2019 with high sensitive troponins negative x2.  EKG no acute changes.  CT head unremarkable.  She was offered MRI for dizziness but preferred to discuss open MRI with her PCP.  EKG showed normal sinus rhythm.  Represented to the ED 07/23/2019 with elevated blood pressure and face tingling.  Noted stressor including her mother who is in declining health.  She was giving 0.5 mg Ativan for use as needed.  Brings blood pressure log call from home with  blood pressures Sunday 8:31 AM (160/9) 9:30 AM (151/80) 10:29 AM (129/96) 1:38 PM (130/65).  Heart rates from Saturday and Sunday show 65 bpm to 111 bpm.  Eats a very low-sodium diet and does most of the cooking to prevent sodium being added into her food.  Reports she can tell when she has high blood pressure she feels dizzy.  Takes herbal medications at approximately 8 AM and 7 PM.  Is very worried about her blood pressure as she is planning to drive to stay with her mother for a month to help take care of her.  EKGs/Labs/Other Studies Reviewed:   The following studies were reviewed today:  EKG:  EKG is ordered today.  The  ekg ordered today demonstrates sinus bradycardia 50 bpm with LVH and QRS widening  Recent Labs: 06/29/2019: TSH 3.25 07/22/2019: Hemoglobin 13.2; Platelets 198 07/23/2019: ALT 14; BUN 19; Creatinine, Ser 0.80; Potassium 3.6; Sodium 140  Recent Lipid Panel    Component Value Date/Time   CHOL 191 06/29/2019 1146   TRIG 72.0 06/29/2019 1146   HDL 72.40 06/29/2019 1146   CHOLHDL 3 06/29/2019 1146   VLDL 14.4 06/29/2019 1146   LDLCALC 105 (H) 06/29/2019 1146    Home Medications   Current Meds  Medication Sig  . acetaminophen (TYLENOL) 500 MG tablet Take 1,000 mg every 8 (eight) hours as needed by mouth for mild pain.  Marland Kitchen apixaban (ELIQUIS) 5 MG TABS tablet Please call the office to make an appt  . Calcium Carbonate-Vitamin D 600-400 MG-UNIT tablet Take by mouth.  . Cholecalciferol (VITAMIN D) 2000 units tablet Take 2,000 Units by mouth daily.  Marland Kitchen diltiazem (CARDIZEM CD) 120 MG 24 hr capsule Take 1 capsule (120 mg total) by mouth daily.  Marland Kitchen donepezil (ARICEPT) 5 MG tablet Take 5 mg by mouth at bedtime.  Marland Kitchen ezetimibe (ZETIA) 10 MG tablet Take 1 tablet (10 mg total) by mouth daily.  . flecainide (TAMBOCOR) 50 MG tablet TAKE 1 TABLET (50 MG TOTAL) BY MOUTH 2 (TWO) TIMES DAILY.  Marland Kitchen ipratropium (ATROVENT) 0.06 % nasal spray Place 2 sprays into both nostrils 3 (three) times daily.  Marland Kitchen levocetirizine (XYZAL) 5 MG tablet Take 0.5-1 tablets (2.5-5 mg total) by mouth at bedtime as needed for allergies.  Marland Kitchen losartan (COZAAR) 50 MG tablet Take 1.5 tablets (75 mg total) by mouth daily. At night  . Olopatadine HCl 0.2 % SOLN Apply 1 drop to eye daily. Both  . propranolol (INDERAL) 20 MG tablet Take 1 tablet (20 mg total) by mouth 3 (three) times daily as needed (As needed for breakthrough tachycardia (fast heart rates)).  Marland Kitchen propranolol ER (INDERAL LA) 60 MG 24 hr capsule Take 1 capsule (60 mg total) by mouth daily.  . sodium chloride (OCEAN) 0.65 % SOLN nasal spray Place 1-2 sprays into both nostrils daily as  needed for congestion.  Marland Kitchen zolpidem (AMBIEN) 5 MG tablet Take 1 tablet (5 mg total) by mouth at bedtime as needed for sleep.  . [DISCONTINUED] losartan (COZAAR) 50 MG tablet Take 1 tablet (50 mg total) by mouth daily. At night      Review of Systems       Review of Systems  Constitution: Negative for chills, fever and malaise/fatigue.  Cardiovascular: Negative for chest pain, dyspnea on exertion, irregular heartbeat, leg swelling, near-syncope, orthopnea, palpitations and syncope.  Respiratory: Negative for cough, shortness of breath and wheezing.   Gastrointestinal: Negative for melena, nausea and vomiting.  Genitourinary: Negative for hematuria.  Neurological: Negative  for dizziness, light-headedness and weakness.   All other systems reviewed and are otherwise negative except as noted above.  Physical Exam    VS:  BP 138/66 (BP Location: Left Arm, Patient Position: Sitting, Cuff Size: Normal)   Pulse (!) 58   Ht 5\' 2"  (1.575 m)   Wt 152 lb (68.9 kg)   SpO2 94%   BMI 27.80 kg/m  , BMI Body mass index is 27.8 kg/m. GEN: Well nourished, well developed, in no acute distress. HEENT: normal. Neck: Supple, no JVD, carotid bruits, or masses. Cardiac: RRR, no murmurs, rubs, or gallops. No clubbing, cyanosis, edema.  Radials/DP/PT 2+ and equal bilaterally.  Respiratory:  Respirations regular and unlabored, clear to auscultation bilaterally. GI: Soft, nontender, nondistended, BS + x 4. MS: No deformity or atrophy. Skin: Warm and dry, no rash. Neuro:  Strength and sensation are intact. Psych: Normal affect.  Assessment & Plan    1. PAF -maintaining NSR.  Continue flecainide 50 mg twice daily with appropriate AV nodal blocking agent and propranolol 60 mg 3/day as well as diltiazem 120 mg daily. 2. Chronic anticoagulation -denies bleeding complications.  Recent hemoglobin normal.  Continue Eliquis 5 mg twice daily. 3. HTN -BP remains elevated at home [id number] to [id number] systolic.  Continue  diltiazem 120 mg daily, propanolol 60 mg daily.  Increase losartan to 75 mg daily. 4. Anxiety - Likely contributory to elevated blood pressures.  Continue to follow with PCP. 5. Snores - Consider OSA may be contributory to HTN.  She has never undergone sleep testing.  She will consider after she returns from her upcoming trip. 6. HLD -continue Zetia 10 mg daily.  She has stopped Crestor 5 mg 3 times per week as she was taking it prior to her elevated blood pressure episode she associates this with her elevated blood pressure.  And has been at the anxiety surrounding the statin could have caused some of her elevated blood pressure.  Will readdress at follow-up.  Disposition: Follow up in 4 week(s) with Dr. Rockey Situ or APP   Loel Dubonnet, NP 07/31/2019, 4:47 PM

## 2019-08-01 ENCOUNTER — Telehealth: Payer: Self-pay | Admitting: Internal Medicine

## 2019-08-01 ENCOUNTER — Other Ambulatory Visit: Payer: Self-pay | Admitting: Cardiovascular Disease

## 2019-08-01 NOTE — Telephone Encounter (Signed)
Refill request

## 2019-08-01 NOTE — Telephone Encounter (Signed)
Dr. Rockey Situ recs inform pt  Would stay on the diltiazem and the flecainide  Stay on propranolol, losartan, Eliquis  Keep an eye on blood pressure

## 2019-08-01 NOTE — Telephone Encounter (Signed)
-----   Message from Valora Corporal, RN sent at 07/31/2019  8:35 AM EDT ----- Minna Merritts, MD at 07/27/2019 10:30 AM  Status: Signed    Would stay on the diltiazem and the flecainide Stay on propranolol, losartan, Eliquis Keep an eye on blood pressure   ----- Message ----- From: McLean-Scocuzza, Nino Glow, MD Sent: 07/28/2019   8:05 AM EDT To: Valora Corporal, RN  Thank you ----- Message ----- From: Valora Corporal, RN Sent: 07/27/2019   1:00 PM EDT To: Nino Glow McLean-Scocuzza, MD  07/31/19 appt here with one of our APP's ----- Message ----- From: McLean-Scocuzza, Nino Glow, MD Sent: 07/27/2019  12:16 PM EDT To: Valora Corporal, RN  Thank you ----- Message ----- From: Valora Corporal, RN Sent: 07/27/2019  11:22 AM EDT To: Nino Glow McLean-Scocuzza, MD  Happy to assist. Thanks ----- Message ----- From: McLean-Scocuzza, Nino Glow, MD Sent: 07/27/2019  10:51 AM EDT To: Valora Corporal, RN  Pt and daughter wanting to know if will continue diltiazem and flecainide I was thinking yes but pt needs f/u after ED visit due to Afib  Can you all reach out to them please?

## 2019-08-02 ENCOUNTER — Other Ambulatory Visit: Payer: Self-pay | Admitting: Obstetrics and Gynecology

## 2019-08-02 ENCOUNTER — Telehealth: Payer: Self-pay | Admitting: *Deleted

## 2019-08-02 DIAGNOSIS — N852 Hypertrophy of uterus: Secondary | ICD-10-CM

## 2019-08-02 NOTE — Telephone Encounter (Signed)
No answer/No voicemail.   Author: Minna Merritts, MD Service: Cardiology Author Type: Physician  Filed: 07/28/2019 3:41 PM Encounter Date: 07/27/2019 Status: Signed  Editor: Minna Merritts, MD (Physician)     Would stay on the diltiazem and the flecainide Stay on propranolol, losartan, Eliquis Keep an eye on blood pressure      Author: Minna Merritts, MD Service: Cardiology Author Type: Physician  Filed: 07/28/2019 3:41 PM Encounter Date: 07/27/2019 Status: Signed  Editor: Minna Merritts, MD (Physician)

## 2019-08-02 NOTE — Telephone Encounter (Signed)
Patient was notified.

## 2019-08-02 NOTE — Telephone Encounter (Deleted)
-----   Message from Minna Merritts, MD sent at 07/28/2019  3:40 PM EDT -----   ----- Message ----- From: McLean-Scocuzza, Nino Glow, MD Sent: 07/27/2019  10:51 AM EDT To: Minna Merritts, MD  Pt and daughter wanting to know if will continue diltiazem and flecainide I was thinking yes but pt needs f/u after ED visit due to Afib Can you all reach out to them please?

## 2019-08-02 NOTE — Telephone Encounter (Signed)
No answer, no voicemail.

## 2019-08-07 DIAGNOSIS — R42 Dizziness and giddiness: Secondary | ICD-10-CM | POA: Diagnosis not present

## 2019-08-07 NOTE — Telephone Encounter (Signed)
Spoke with patient and reviewed provider recommendations and she verbalized understanding. Also confirmed her upcoming appointment and she did report that she needed to reschedule this. Let her know that I would have someone from scheduling call to try and assist with getting that on better day for her. She verbalized understanding of our conversation, agreement with plan, and had no further questions at this time.

## 2019-08-07 NOTE — Telephone Encounter (Signed)
No answer. No voicemail. 

## 2019-08-09 ENCOUNTER — Telehealth: Payer: Self-pay | Admitting: Internal Medicine

## 2019-08-09 NOTE — Telephone Encounter (Signed)
08/07/19 MRI brain normal but some age related changes=chronic small vessel disease   No stroke or reason for dizziness   MRI 08/07/19 [facility]   TMS

## 2019-08-10 NOTE — Telephone Encounter (Signed)
No answer, no voicemail.

## 2019-08-11 DIAGNOSIS — F4323 Adjustment disorder with mixed anxiety and depressed mood: Secondary | ICD-10-CM | POA: Diagnosis not present

## 2019-08-11 NOTE — Telephone Encounter (Signed)
Patient informed and verbalized understanding

## 2019-08-25 ENCOUNTER — Telehealth: Payer: Self-pay | Admitting: Obstetrics and Gynecology

## 2019-08-25 NOTE — Telephone Encounter (Signed)
Called in to cancel appt stated shes in [place] and will call when she comes back.

## 2019-08-29 ENCOUNTER — Encounter: Payer: Self-pay | Admitting: Obstetrics and Gynecology

## 2019-08-29 ENCOUNTER — Other Ambulatory Visit: Payer: Self-pay

## 2019-09-01 ENCOUNTER — Telehealth: Payer: Medicare Other | Admitting: Family

## 2019-09-01 ENCOUNTER — Encounter: Payer: Self-pay | Admitting: Obstetrics and Gynecology

## 2019-09-18 NOTE — Progress Notes (Signed)
Virtual Visit via Video Note   This visit type was conducted due to national recommendations for restrictions regarding the COVID-19 Pandemic (e.g. social distancing) in an effort to limit this patient's exposure and mitigate transmission in our community.  Due to her co-morbid illnesses, this patient is at least at moderate risk for complications without adequate follow up.  This format is felt to be most appropriate for this patient at this time.  All issues noted in this document were discussed and addressed.  A limited physical exam was performed with this format.  Please refer to the patient's chart for her consent to telehealth for [facility].   The patient was identified using 2 identifiers.  Date:  09/21/2019   ID:  Gina Frazier, DOB May 06, 1945, MRN [id number]  Patient Location: Home Provider Location: Office  PCP:  McLean-Scocuzza, Nino Glow, MD  Cardiologist:  [name], MD  Electrophysiologist:  None   Evaluation Performed:  Follow-Up Visit  Chief Complaint:  Follow up  History of Present Illness:    Gina Frazier is a 74 y.o. female with PAF on Eliquis, aortic atherosclerosis, HTN, OA, chronic back pain, and anxiety who is being seen virtually today for follow up of elevated BP.   Echo in 05/2015 showed an EF of 60-65% and was without significant valvular abnormalities.  More recently, she was seen by her primary cardiologist on 07/19/2019 with BP in the 542H systolic.  She was doing reasonably well.  No changes were made.  She was seen in the ED 3 days later palpitations, dizziness/lightheadedness, and elevated BP with readings as high as 062-376 systolic.  Troponin negative x2.  EKG no acute changes.  CT head without acute process.  MRI of the brain was declined.  She was seen again in the ED 24 hours later with elevated BP and facial paresthesias in the setting of increased stress.  She was prescribed Ativan as needed.  She was seen in follow-up on 07/31/2019 with BP  ranging from 283-151 systolic over the 76H to 60V diastolic with heart rates ranging from 65 to 111 bpm.  She noted dizziness when she had elevated BPs.  Losartan was titrated to 75 mg daily.  She was continued on propanolol 60 mg as well as Cardizem CD 120 mg.  Stress and anxiety were felt to be contributing to her elevated BP.  She was also noted to have sleep disordered breathing with possible sleep apnea contributing to her elevated BP.  Subsequent MRI of the brain on 08/07/2019 through [facility], obtained through her PCP, showed no acute process.  She is seen virtually with the assistance of her daughter to assist with her video visit.  Since titrating losartan to 75 mg daily with continuation of propanolol and diltiazem her blood pressure has significantly improved.  She is also eating less sodium.  Highest BP reading has been in the 140s.  She denies any significant hypotensive readings.  No falls, lightheadedness, fatigue, dizziness, recent BP, syncope.  No chest pain, palpitations, or dyspnea.  No hematochezia or melena.  He continues to remain off Crestor secondary to GI upset though is tolerating Zetia.  He does not have any issues or concerns at this time.   Labs independently reviewed: 07/2019 - potassium 3.6, BUN 19, SCr 0.80, albumin 4.1, AST/ALT normal, HGB 13.2, PLT 198 06/2019 - TSH normal, TC 191, TG 72, HDL 72, LDL 105  The patient does not have symptoms concerning for COVID-19 infection (fever, chills, cough, or new  shortness of breath).    Past Medical History:  Diagnosis Date  . Allergy   . Anxiety   . Bell's palsy    left  . Chronic back pain   . Family history of adverse reaction to anesthesia    pts daughter had severe vomiting 2014   . Hip joint pain    left  . Hypertension   . Osteoarthritis   . PAF (paroxysmal atrial fibrillation) ([place])    a. initially diagnosed 04/2014; b. on Eliquis; c. CHADS2VASc = 3  . Palpitations   . Wears contact lenses   . Wears  dentures    partial upper   Past Surgical History:  Procedure Laterality Date  . CHOLECYSTECTOMY    . COLONOSCOPY WITH PROPOFOL N/A 11/05/2014   Procedure: COLONOSCOPY WITH PROPOFOL;  Surgeon: Lucilla Lame, MD;  Location: [place];  Service: Endoscopy;  Laterality: N/A;  . JOINT REPLACEMENT     left knee x 2 04/2024 and 01/2017 Dr. Rudene Christians   . KNEE ARTHROSCOPY Left 06/28/2014   Procedure: ARTHROSCOPY KNEE WITH PARTIALLATERAL MENISECTOMY AND DEBRIDEMENT;  Surgeon: Susa Day, MD;  Location: [facility];  Service: Orthopedics;  Laterality: Left;  . TOOTH EXTRACTION    . 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 TOTAL KNEE REVISION Left 02/11/2017   Procedure: TOTAL KNEE REVISION, POLYETHELENE EXCHANGE;  Surgeon: Hessie Knows, MD;  Location: [facility];  Service: Orthopedics;  Laterality: Left;     Current Meds  Medication Sig  . acetaminophen (TYLENOL) 500 MG tablet Take 1,000 mg every 8 (eight) hours as needed by mouth for mild pain.  Marland Kitchen apixaban (ELIQUIS) 5 MG TABS tablet Take 1 tablet (5 mg total) by mouth 2 (two) times daily.  . Calcium Carbonate-Vitamin D 600-400 MG-UNIT tablet Take by mouth.  . Cholecalciferol (VITAMIN D) 2000 units tablet Take 2,000 Units by mouth daily.  Marland Kitchen diltiazem (CARDIZEM CD) 120 MG 24 hr capsule Take 1 capsule (120 mg total) by mouth daily.  Marland Kitchen donepezil (ARICEPT) 5 MG tablet Take 5 mg by mouth at bedtime.  Marland Kitchen ezetimibe (ZETIA) 10 MG tablet Take 1 tablet (10 mg total) by mouth daily.  . flecainide (TAMBOCOR) 50 MG tablet TAKE 1 TABLET (50 MG TOTAL) BY MOUTH 2 (TWO) TIMES DAILY.  Marland Kitchen ipratropium (ATROVENT) 0.06 % nasal spray Place 2 sprays into both nostrils 3 (three) times daily.  Marland Kitchen levocetirizine (XYZAL) 5 MG tablet Take 0.5-1 tablets (2.5-5 mg total) by mouth at bedtime as needed for allergies.  Marland Kitchen losartan (COZAAR) 50 MG tablet Take  1.5 tablets (75 mg total) by mouth daily. At night  . Olopatadine HCl 0.2 % SOLN Apply 1 drop to eye daily. Both  . propranolol (INDERAL) 20 MG tablet Take 1 tablet (20 mg total) by mouth 3 (three) times daily as needed (As needed for breakthrough tachycardia (fast heart rates)).  Marland Kitchen propranolol ER (INDERAL LA) 60 MG 24 hr capsule Take 1 capsule (60 mg total) by mouth daily.  . sodium chloride (OCEAN) 0.65 % SOLN nasal spray Place 1-2 sprays into both nostrils daily as needed for congestion.  Marland Kitchen zolpidem (AMBIEN) 5 MG tablet Take 1 tablet (5 mg total) by mouth at bedtime as needed for sleep.     Allergies:   Pravastatin and Fish allergy   Social History   Tobacco Use  . Smoking status: Never Smoker  .  Smokeless tobacco: Never Used  Vaping Use  . Vaping Use: Never used  Substance Use Topics  . Alcohol use: No  . Drug use: No     Family Hx: The patient's family history includes Arthritis in her daughter; Bladder Cancer in her sister; Breast cancer in an other family member; Breast cancer (age of onset: 59) in her sister; Cancer in an other family member; Depression in her daughter, sister, and another family member; Diabetes in her mother and sister; GER disease in her daughter; Heart disease in her brother, father, mother, and another family member; Hypertension in her brother, mother, and sister; Hypothyroidism in her daughter; Lupus in her daughter; Mental illness in her mother.  ROS:   Please see the history of present illness.     All other systems reviewed and are negative.   Prior CV studies:   The following studies were reviewed today:  2D echo 06/12/2015: - Left ventricle: The cavity size was normal. Systolic function was  normal. The estimated ejection fraction was in the range of 60%  to 65%. Wall motion was normal; there were no regional wall  motion abnormalities. Doppler parameters are consistent with  abnormal left ventricular relaxation (grade 1 diastolic    dysfunction).  - Left atrium: The atrium was normal in size.  - Right ventricle: Systolic function was normal.  - Pulmonary arteries: Systolic pressure was within the normal  range.   Impressions:   - Normal study.   Labs/Other Tests and Data Reviewed:    EKG:  An ECG dated 07/31/2019 was personally reviewed today and demonstrated:  Sinus bradycardia, 58 bpm, LVH, nonspecific IVCD, poor R wave progression along the precordial leads  Recent Labs: 06/29/2019: TSH 3.25 07/22/2019: Hemoglobin 13.2; Platelets 198 07/23/2019: ALT 14; BUN 19; Creatinine, Ser 0.80; Potassium 3.6; Sodium 140   Recent Lipid Panel Lab Results  Component Value Date/Time   CHOL 191 06/29/2019 11:46 AM   TRIG 72.0 06/29/2019 11:46 AM   HDL 72.40 06/29/2019 11:46 AM   CHOLHDL 3 06/29/2019 11:46 AM   LDLCALC 105 (H) 06/29/2019 11:46 AM    Wt Readings from Last 3 Encounters:  09/21/19 145 lb (65.8 kg)  07/31/19 152 lb (68.9 kg)  07/27/19 153 lb (69.4 kg)     Objective:    Vital Signs:  Ht 5\' 1"  (1.549 m)   Wt 145 lb (65.8 kg)   BMI 27.40 kg/m    VITAL SIGNS:  reviewed GEN:  no acute distress EYES:  sclerae anicteric, EOMI - Extraocular Movements Intact  ASSESSMENT & PLAN:    1. PAF: No symptoms concerning for tachypalpitations/recurrent arrhythmia.  Maintaining sinus rhythm with a bradycardic rate at her last visit.  Given her CHA2DS2-VASc of at least 5 (HTN, age x 2, vascular disease, gender), she remains on Eliquis without any symptoms concerning for bleeding with stable CBC as outlined above.  Continue flecainide and propanolol.  2. HTN: Blood pressure much improved on higher dose losartan 75 mg daily.  She will otherwise continue current dose propanolol and diltiazem.  Low-sodium diet discussed.  3. HLD: LDL 105 from 06/2019.  No longer taking Crestor secondary to GI upset.  Tolerating Zetia.  4. Sleep disordered breathing: Consider outpatient sleep study and follow-up.  This was not discussed  during this visit.  COVID-19 Education: The signs and symptoms of COVID-19 were discussed with the patient and how to seek care for testing (follow up with PCP or arrange E-visit).  The importance of social distancing  was discussed today.  Time:   Today, I have spent 9 minutes with the patient with telehealth technology discussing the above problems.     Medication Adjustments/Labs and Tests Ordered: Current medicines are reviewed at length with the patient today.  Concerns regarding medicines are outlined above.   Tests Ordered: No orders of the defined types were placed in this encounter.   Medication Changes: No orders of the defined types were placed in this encounter.   Follow Up:  In Person in 6 month(s)  Signed, [name], PA-C  09/21/2019 3:23 PM    [place]

## 2019-09-21 ENCOUNTER — Other Ambulatory Visit: Payer: Self-pay

## 2019-09-21 ENCOUNTER — Telehealth (INDEPENDENT_AMBULATORY_CARE_PROVIDER_SITE_OTHER): Payer: Medicare Other | Admitting: Physician Assistant

## 2019-09-21 ENCOUNTER — Telehealth: Payer: Self-pay

## 2019-09-21 ENCOUNTER — Encounter: Payer: Self-pay | Admitting: Physician Assistant

## 2019-09-21 VITALS — Ht 61.0 in | Wt 145.0 lb

## 2019-09-21 DIAGNOSIS — E782 Mixed hyperlipidemia: Secondary | ICD-10-CM | POA: Diagnosis not present

## 2019-09-21 DIAGNOSIS — I480 Paroxysmal atrial fibrillation: Secondary | ICD-10-CM

## 2019-09-21 DIAGNOSIS — I10 Essential (primary) hypertension: Secondary | ICD-10-CM | POA: Diagnosis not present

## 2019-09-21 NOTE — Telephone Encounter (Signed)
Yes, I consent for the virtual visit today with [name], PA per the daughter Gina Frazier is on the [facility].  Your [facility] team (Cardiologist [Heart Doctor] and Advanced Practice Provider [phone] Assistant; Nurse Practitioner]) has arranged for your next office appointment to be a virtual visit (also known as "Telehealth", "Telemedicine", "E-Visit").   We now offer virtual visits for all our patients.  This helps [place] to expand our ability to see patients in a timely and safe manner.  These visits are billed to your insurance just like traditional, in person, appointments.  Please review this IMPORTANT information about your upcoming appointment.   **PLEASE READ THE SECTION BELOW LABELED "CONSENT".**   **CALL OUR OFFICE WITH QUESTIONS.**   WHAT YOU NEED FOR YOUR VIRTUAL VISIT:  You will need a SmartPhone with microphone and video capability.  [If you are using MyChart to connect to your visit, it is also possible to use a desktop/laptop computer (with an Internet connection), as long as you have microphone and video capability.]  You will need to use Chrome, Edge or [employer] as your Wellsite geologist.  We highly recommend that you have a MyChart account as this will make connecting to your visit seamless.  A MyChart account not only allows you to connect to your provider for a virtual visit, but also allows you to see the results of all your tests, provider notes, medications and upcoming appointments.    A MyChart account is not absolutely necessary.  We can still complete your visit if you do not have one.    If you do not have a computer or SmartPhone with video/microphone capability or your Internet/cell service is weak on the day of your visit, we will do your visit by telephone.    A blood pressure cuff and scale are essential to collect your vital signs at home.  If you do not have these and you are unable to obtain them, please contact our office so that we can make arrangements  for you.  If you have a pulse oximeter, Apple watch, Kardia mobile device, etc, you can collect data from these devices as well to share with the provider for your visit.  These devices are not required for a virtual visit.    WHAT TO DO ON THE DAY OF YOUR APPOINTMENT: 30 minutes before your appointment:  Take your blood pressure, pulse or heart rate (if your blood pressure machine is able to collect it) and weight.  Write all these numbers down so you can give it to the nurse or medical assistant that calls.  Get all of the medications you currently take and put them where you will be sitting for the appointment.  The nurse/medical assistant will go over these with you when he/she calls.  15 minutes before your appointment:  You will receive a phone call from a nurse or medical assistant from our office.  The caller ID on your phone may indicate that the caller is either "[facility]" or "[facility]".  However, the number may come across as spam.  Please turn off any spam blocker so you do not miss the call.  The nurse will:  Ask for your blood pressure, pulse, weight, height.  Go over all of your medications to make sure your chart is correct.  Review your allergies, smoking history, reason for appointment, etc.   Give you instructions on how to connect with the video platform or telephone.  IF YOUR VISIT IS BY TELEPHONE  ONLY: After the nurse finishes getting you ready for the visit, your provider will call you on the phone number you provide to [place].   TO CONNECT WITH YOUR PROVIDER FOR YOUR APPOINTMENT (BY VIDEO): You will either connect with the provider with your MyChart account or (if you are not using MyChart) with a link sent to your SmartPhone by text message.  If you are using MyChart, see below.  If you are not using MyChart, the nurse will send you the text message during the phone call.     If you are connecting with your MyChart account:   (The nurse that calls you  will tell you when to do this):  You will log into your account. At the top of your home screen, you should see the following prompt that tells you to "Begin your video visit with . . . ".  Click the green button (BEGIN VISIT).    The next screen will say "It's time to start your video visit!" Click the green button (BEGIN VIDEO VISIT)    There may be a screen that appears that asks for permission to use your camera and/or microphone.  Click ALLOW.  This will open the browser where your appointment will take place.   You may see the message: "Welcome.  Waiting for the call to begin."  Or, you will see that the nurse or your provider are already "in" the room waiting on you.   If you are connecting with a link sent to your SmartPhone via text: The nurse that calls you will send the link by text message. Click on the link (it should look something like this):     If asked to give permission to use the camera and or microphone, click ALLOW This will open the browser where your appointment will take place.      You may see the message: "Welcome.  Waiting for the call to begin."  Or, you will see that the nurse or your provider are already "in" the room waiting on you.    The controls for your visit look like the picture below.  Please note that this is what the microphone and camera look like when they are ON.  If muted, they will have a line through them.    After the appointment: Once your provider leaves the appointment, he/she will go over instructions with the nurse/medical assistant.  If needed, this person will call you with any instructions, appointments, etc.   A copy of your After Visit Summary (AVS) will be available later that day in your MyChart account.  This document will have all of your instructions, medications, appointments, etc.  If you are not using MyChart, we will mail it to your home.     *CONSENT FOR TELE-HEALTH VISIT - PLEASE REVIEW* By participating in  the scheduled virtual visit (and any virtual visit scheduled within 365 days of the printing of this document), I agree to the following:   I hereby voluntarily request, consent and authorize [facility] and its employed or contracted physicians, physician assistants, nurse practitioners or other licensed health care professionals (the Practitioner), to provide me with telemedicine health care services (the "Services") as deemed necessary by the treating Practitioner. I acknowledge and consent to receive the Services by the Practitioner via telemedicine. I understand that the telemedicine visit will involve communicating with the Practitioner through live audiovisual communication technology and the disclosure of certain medical information by electronic transmission. I acknowledge that I  have been given the opportunity to request an in-person assessment or other available alternative prior to the telemedicine visit and am voluntarily participating in the telemedicine visit.  I understand that I have the right to withhold or withdraw my consent to the use of telemedicine in the course of my care at any time, without affecting my right to future care or treatment, and that the Practitioner or I may terminate the telemedicine visit at any time. I understand that I have the right to inspect all information obtained and/or recorded in the course of the telemedicine visit and may receive copies of available information for a reasonable fee.  I understand that some of the potential risks of receiving the Services via telemedicine include:  Marland Kitchen Delay or interruption in medical evaluation due to technological equipment failure or disruption; . Information transmitted may not be sufficient (e.g. poor resolution of images) to allow for appropriate medical decision making by the Practitioner; and/or  . In rare instances, security protocols could fail, causing a breach of personal health information.  Furthermore, I  acknowledge that it is my responsibility to provide information about my medical history, conditions and care that is complete and accurate to the best of my ability. I acknowledge that Practitioner's advice, recommendations, and/or decision may be based on factors not within their control, such as incomplete or inaccurate data provided by me or distortions of diagnostic images or specimens that may result from electronic transmissions. I understand that the practice of medicine is not an exact science and that Practitioner makes no warranties or guarantees regarding treatment outcomes. I acknowledge that a copy of this consent can be made available to me via my patient portal ([facility]), or I can request a printed copy by calling the office of [place].    I understand that my insurance will be billed for this visit.   I have read or had this consent read to me. . I understand the contents of this consent, which adequately explains the benefits and risks of the Services being provided via telemedicine.  . I have been provided ample opportunity to ask questions regarding this consent and the Services and have had my questions answered to my satisfaction. . I give my informed consent for the services to be provided through the use of telemedicine in my medical care

## 2019-09-21 NOTE — Patient Instructions (Signed)
Medication Instructions:  No changes  *If you need a refill on your cardiac medications before your next appointment, please call your pharmacy*   Lab Work: None  If you have labs (blood work) drawn today and your tests are completely normal, you will receive your results only by: Marland Kitchen MyChart Message (if you have MyChart) OR . A paper copy in the mail If you have any lab test that is abnormal or we need to change your treatment, we will call you to review the results.   Testing/Procedures: None   Follow-Up: At [facility], you and your health needs are our priority.  As part of our continuing mission to provide you with exceptional heart care, we have created designated Provider Care Teams.  These Care Teams include your primary Cardiologist (physician) and Advanced Practice Providers (APPs -  Physician Assistants and Nurse Practitioners) who all work together to provide you with the care you need, when you need it.   Your next appointment:   6 month(s)  The format for your next appointment:   In Person  Provider:    You may see [name], MD or one of the following Advanced Practice Providers on your designated Care Team:    [name], NP  [name], PA-C  [name], PA-C

## 2019-10-10 ENCOUNTER — Other Ambulatory Visit: Payer: Self-pay | Admitting: Internal Medicine

## 2019-10-10 DIAGNOSIS — G4700 Insomnia, unspecified: Secondary | ICD-10-CM

## 2019-10-10 MED ORDER — ZOLPIDEM TARTRATE 5 MG PO TABS: 5.0000 mg | ORAL_TABLET | Freq: Every evening | ORAL | 1 refills | Status: DC | PRN

## 2019-10-12 ENCOUNTER — Ambulatory Visit (INDEPENDENT_AMBULATORY_CARE_PROVIDER_SITE_OTHER): Payer: Medicare Other

## 2019-10-12 VITALS — Ht 61.0 in | Wt 145.0 lb

## 2019-10-12 DIAGNOSIS — Z0000 Encounter for general adult medical examination without abnormal findings: Secondary | ICD-10-CM

## 2019-10-12 NOTE — Progress Notes (Signed)
Subjective:   Gina Frazier is a 74 y.o. female who presents for an Initial Medicare Annual Wellness Visit.  Review of Systems    No ROS.  Medicare Wellness Virtual Visit.     Cardiac Risk Factors include: advanced age (>59men, >79 women);hypertension     Objective:    Today's Vitals   10/12/19 1038  Weight: 145 lb (65.8 kg)  Height: 5\' 1"  (1.549 m)   Body mass index is 27.4 kg/m.  Advanced Directives 10/12/2019 07/22/2019 07/22/2019 06/09/2017 02/11/2017 02/11/2017 02/08/2017  Does Patient Have a Medical Advance Directive? No No No No No No No  Does patient want to make changes to medical advance directive? - - - - - - -  Would patient like information on creating a medical advance directive? No - Patient declined - - - No - Patient declined No - Patient declined No - Patient declined  Some encounter information is confidential and restricted. Go to Review Flowsheets activity to see all data.    Current Medications (verified) Outpatient Encounter Medications as of 10/12/2019  Medication Sig  . acetaminophen (TYLENOL) 500 MG tablet Take 1,000 mg every 8 (eight) hours as needed by mouth for mild pain.  Marland Kitchen apixaban (ELIQUIS) 5 MG TABS tablet Take 1 tablet (5 mg total) by mouth 2 (two) times daily.  . Calcium Carbonate-Vitamin D 600-400 MG-UNIT tablet Take by mouth.  . Cholecalciferol (VITAMIN D) 2000 units tablet Take 2,000 Units by mouth daily.  Marland Kitchen diltiazem (CARDIZEM CD) 120 MG 24 hr capsule Take 1 capsule (120 mg total) by mouth daily.  Marland Kitchen donepezil (ARICEPT) 5 MG tablet Take 5 mg by mouth at bedtime.  Marland Kitchen ezetimibe (ZETIA) 10 MG tablet Take 1 tablet (10 mg total) by mouth daily.  . flecainide (TAMBOCOR) 50 MG tablet TAKE 1 TABLET (50 MG TOTAL) BY MOUTH 2 (TWO) TIMES DAILY.  Marland Kitchen ipratropium (ATROVENT) 0.06 % nasal spray Place 2 sprays into both nostrils 3 (three) times daily.  Marland Kitchen levocetirizine (XYZAL) 5 MG tablet Take 0.5-1 tablets (2.5-5 mg total) by mouth at bedtime as needed for  allergies.  Marland Kitchen losartan (COZAAR) 50 MG tablet Take 1.5 tablets (75 mg total) by mouth daily. At night  . Olopatadine HCl 0.2 % SOLN Apply 1 drop to eye daily. Both  . propranolol (INDERAL) 20 MG tablet Take 1 tablet (20 mg total) by mouth 3 (three) times daily as needed (As needed for breakthrough tachycardia (fast heart rates)).  Marland Kitchen propranolol ER (INDERAL LA) 60 MG 24 hr capsule Take 1 capsule (60 mg total) by mouth daily.  . sodium chloride (OCEAN) 0.65 % SOLN nasal spray Place 1-2 sprays into both nostrils daily as needed for congestion.  Marland Kitchen zolpidem (AMBIEN) 5 MG tablet Take 1 tablet (5 mg total) by mouth at bedtime as needed for sleep.   No facility-administered encounter medications on file as of 10/12/2019.    Allergies (verified) Pravastatin and Fish allergy   History: Past Medical History:  Diagnosis Date  . Allergy   . Anxiety   . Bell's palsy    left  . Chronic back pain   . Family history of adverse reaction to anesthesia    pts daughter had severe vomiting 2014   . Hip joint pain    left  . Hypertension   . Osteoarthritis   . PAF (paroxysmal atrial fibrillation) ([place])    a. initially diagnosed 04/2014; b. on Eliquis; c. CHADS2VASc = 3  . Palpitations   . Wears contact lenses   .  Wears dentures    partial upper   Past Surgical History:  Procedure Laterality Date  . CHOLECYSTECTOMY    . COLONOSCOPY WITH PROPOFOL N/A 11/05/2014   Procedure: COLONOSCOPY WITH PROPOFOL;  Surgeon: Lucilla Lame, MD;  Location: [facility];  Service: Endoscopy;  Laterality: N/A;  . JOINT REPLACEMENT     left knee x 2 04/2024 and 01/2017 Dr. Rudene Christians   . KNEE ARTHROSCOPY Left 06/28/2014   Procedure: ARTHROSCOPY KNEE WITH PARTIALLATERAL MENISECTOMY AND DEBRIDEMENT;  Surgeon: Susa Day, MD;  Location: [facility];  Service: Orthopedics;  Laterality: Left;  . TOOTH EXTRACTION    . 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 TOTAL KNEE REVISION Left 02/11/2017   Procedure: TOTAL KNEE REVISION, POLYETHELENE EXCHANGE;  Surgeon: Hessie Knows, MD;  Location: [facility];  Service: Orthopedics;  Laterality: Left;   Family History  Problem Relation Age of Onset  . Heart disease Mother   . Hypertension Mother   . Mental illness Mother   . Diabetes Mother   . Heart disease Father   . Hypertension Sister   . Depression Sister   . Diabetes Sister   . Breast cancer Sister 63  . Bladder Cancer Sister   . Heart disease Brother   . Hypertension Brother   . Arthritis Daughter   . GER disease Daughter   . Hypothyroidism Daughter   . Lupus Daughter   . Depression Daughter   . Cancer Other        colon cancer, breast cancer   . Heart disease Other   . Breast cancer Other   . Depression Other    Social History   Socioeconomic History  . Marital status: Divorced    Spouse name: Not on file  . Number of children: 2  . Years of education: Not on file  . Highest education level: Not on file  Occupational History  . Not on file  Tobacco Use  . Smoking status: Never Smoker  . Smokeless tobacco: Never Used  Vaping Use  . Vaping Use: Never used  Substance and Sexual Activity  . Alcohol use: No  . Drug use: No  . Sexual activity: Never  Other Topics Concern  . Not on file  Social History Narrative   2 daughters 1 lives [place] the other [place] pt visits both    Lives with daughter in [place] and grand daughter       Social Determinants of Health   Financial Resource Strain:   . Difficulty of Paying Living Expenses:   Food Insecurity:   . Worried About Charity fundraiser in the Last Year:   . Arboriculturist in the Last Year:   Transportation Needs: No Transportation Needs  . Lack of Transportation (Medical): No  . Lack of Transportation (Non-Medical): No  Physical Activity:   . Days of Exercise per Week:   . Minutes of Exercise  per Session:   Stress:   . Feeling of Stress :   Social Connections:   . Frequency of Communication with Friends and Family:   . Frequency of Social Gatherings with Friends and Family:   . Attends Religious Services:   . Active Member of Clubs or Organizations:   . Attends Archivist Meetings:   Marland Kitchen Marital Status:     Tobacco Counseling Counseling given: Not Answered   Clinical  Intake:  Pre-visit preparation completed: Yes        Diabetes: No  How often do you need to have someone help you when you read instructions, pamphlets, or other written materials from your doctor or pharmacy?: 1 - Never   Interpreter Needed?: No      Activities of Daily Living In your present state of health, do you have any difficulty performing the following activities: 10/12/2019 02/16/2019  Hearing? N N  Vision? N N  Difficulty concentrating or making decisions? Tempie Donning  Walking or climbing stairs? N N  Dressing or bathing? N N  Doing errands, shopping? N Y  Conservation officer, nature and eating ? N -  Using the Toilet? N -  In the past six months, have you accidently leaked urine? N -  Do you have problems with loss of bowel control? N -  Managing your Medications? N -  Managing your Finances? N -  Housekeeping or managing your Housekeeping? N -  Some recent data might be hidden    Patient Care Team: McLean-Scocuzza, Nino Glow, MD as PCP - General (Internal Medicine) Minna Merritts, MD as PCP - Cardiology (Cardiology) Minna Merritts, MD (Cardiology)  Indicate any recent Medical Services you may have received from other than [facility] providers in the past year (date may be approximate).     Assessment:   This is a routine wellness examination for Zahira.  I connected with Alanni today by telephone and verified that I am speaking with the correct person using two identifiers. Location patient: home Location provider: work Persons participating in the virtual visit: patient, Marine scientist.      I discussed the limitations, risks, security and privacy concerns of performing an evaluation and management service by telephone and the availability of in person appointments. The patient expressed understanding and verbally consented to this telephonic visit.    Interactive audio and video telecommunications were attempted between this provider and patient, however failed, due to patient having technical difficulties OR patient did not have access to video capability.  We continued and completed visit with audio only.  Some vital signs may be absent or patient reported.   Hearing/Vision screen  Hearing Screening   125Hz  250Hz  500Hz  1000Hz  2000Hz  3000Hz  4000Hz  6000Hz  8000Hz   Right ear:           Left ear:           Comments: Patient is able to hear conversational tones without difficulty.  No issues reported.  Vision Screening Comments: Followed by [facility] Wears corrective lenses Cataract extraction, bilateral Visual acuity not assessed, virtual visit.  They have seen their ophthalmologist in the last 12 months.     Dietary issues and exercise activities discussed: Current Exercise Habits: The patient does not participate in regular exercise at present, Intensity: Mild  Low salt diet Good water intake  Goals    . Follow up with Primary Care Provider     As needed Stay active      Depression Screen PHQ 2/9 Scores 10/12/2019 06/29/2019 02/16/2019 07/05/2017 09/26/2013  PHQ - 2 Score 1 0 1 1 0  PHQ- 9 Score - - 4 8 -  Some encounter information is confidential and restricted. Go to Review Flowsheets activity to see all data.    Fall Risk Fall Risk  10/12/2019 07/27/2019 06/29/2019 02/16/2019 02/08/2019  Falls in the past year? 1 0 0 0 0  Comment - - - - Emmi Telephone Survey: data to providers prior to load  Number falls in past yr: 0 0 0 - -  Comment Patient slipped on a tray on the floor - - - -  Injury with Fall? 0 0 0 - -  Risk for fall due to : - Impaired  balance/gait;Impaired mobility - - -  Follow up Falls evaluation completed Falls evaluation completed Falls evaluation completed Falls evaluation completed -   Handrails in use when climbing stairs? Yes  Home free of loose throw rugs in walkways, pet beds, electrical cords, etc? Yes  Adequate lighting in your home to reduce risk of falls? Yes   ASSISTIVE DEVICES UTILIZED TO PREVENT FALLS:  Life alert? No  Use of a cane, walker or w/c? No  Grab bars in the bathroom? Yes  Shower chair or bench in shower? No  Elevated toilet seat or a handicapped toilet? No   TIMED UP AND GO:  Was the test performed? No . Virtual visit.   Cognitive Function:     6CIT Screen 10/12/2019  What Year? 0 points  What month? 0 points  Months in reverse 4 points  Repeat phrase 4 points   Immunizations Immunization History  Administered Date(s) Administered  . Fluad Quad(high Dose 65+) 11/10/2018  . Hepatitis B 03/16/2014, 10/11/2014, 11/25/2014, 07/29/2015  . Influenza Split 01/05/2011, 12/23/2011, 01/06/2013  . Influenza Whole 04/19/2010  . Influenza, High Dose Seasonal PF 12/17/2015, 02/12/2017, 12/03/2017  . Moderna SARS-COVID-2 Vaccination 04/28/2019, 05/27/2019  . Pneumococcal Conjugate-13 09/29/2013, 01/28/2017  . Pneumococcal Polysaccharide-23 07/21/2010  . Td 04/19/2010  . Tdap 01/06/2013  . Zoster 07/31/2013   Health Maintenance There are no preventive care reminders to display for this patient. Health Maintenance  Topic Date Due  . INFLUENZA VACCINE  10/15/2019  . TETANUS/TDAP  01/07/2023  . DEXA SCAN  Completed  . COVID-19 Vaccine  Completed  . Hepatitis C Screening  Completed  . PNA vac Low Risk Adult  Completed   Dental Screening: Recommended annual dental exams for proper oral hygiene  Community Resource Referral / Chronic Care Management: CRR required this visit?  No   CCM required this visit?  No      Plan:   Keep all routine maintenance appointments.   Follow up  10/13/19 @ 10:00  I have personally reviewed and noted the following in the patient's chart:   . Medical and social history . Use of alcohol, tobacco or illicit drugs  . Current medications and supplements . Functional ability and status . Nutritional status . Physical activity . Advanced directives . List of other physicians . Hospitalizations, surgeries, and ER visits in previous 12 months . Vitals . Screenings to include cognitive, depression, and falls . Referrals and appointments  In addition, I have reviewed and discussed with patient certain preventive protocols, quality metrics, and best practice recommendations. A written personalized care plan for preventive services as well as general preventive health recommendations were provided to patient via mychart.     Varney Biles, LPN   9/37/3428

## 2019-10-12 NOTE — Patient Instructions (Addendum)
Gina Frazier , Thank you for taking time to come for your Medicare Wellness Visit. I appreciate your ongoing commitment to your health goals. Please review the following plan we discussed and let me know if I can assist you in the future.   These are the goals we discussed: Goals    . Follow up with Primary Care Provider     As needed Stay active       This is a list of the screening recommended for you and due dates:  Health Maintenance  Topic Date Due  . Flu Shot  10/15/2019  . Tetanus Vaccine  01/07/2023  . DEXA scan (bone density measurement)  Completed  . COVID-19 Vaccine  Completed  .  Hepatitis C: One time screening is recommended by Center for Disease Control  (CDC) for  adults born from [birth year] through 1967.   Completed  . Pneumonia vaccines  Completed    Immunizations Immunization History  Administered Date(s) Administered  . Fluad Quad(high Dose 65+) 11/10/2018  . Hepatitis B 03/16/2014, 10/11/2014, 11/25/2014, 07/29/2015  . Influenza Split 01/05/2011, 12/23/2011, 01/06/2013  . Influenza Whole 04/19/2010  . Influenza, High Dose Seasonal PF 12/17/2015, 02/12/2017, 12/03/2017  . Moderna SARS-COVID-2 Vaccination 04/28/2019, 05/27/2019  . Pneumococcal Conjugate-13 09/29/2013, 01/28/2017  . Pneumococcal Polysaccharide-23 07/21/2010  . Td 04/19/2010  . Tdap 01/06/2013  . Zoster 07/31/2013   Advanced directives: declined.   Follow up in one year for your annual wellness visit.  Keep all routine maintenance appointments.   Follow up 10/13/19 @ 10:00  Preventive Care 65 Years and Older, Female Preventive care refers to lifestyle choices and visits with your health care provider that can promote health and wellness. What does preventive care include?  A yearly physical exam. This is also called an annual well check.  Dental exams once or twice a year.  Routine eye exams. Ask your health care provider how often you should have your eyes checked.  Personal  lifestyle choices, including:  Daily care of your teeth and gums.  Regular physical activity.  Eating a healthy diet.  Avoiding tobacco and drug use.  Limiting alcohol use.  Practicing safe sex.  Taking low-dose aspirin every day.  Taking vitamin and mineral supplements as recommended by your health care provider. What happens during an annual well check? The services and screenings done by your health care provider during your annual well check will depend on your age, overall health, lifestyle risk factors, and family history of disease. Counseling  Your health care provider may ask you questions about your:  Alcohol use.  Tobacco use.  Drug use.  Emotional well-being.  Home and relationship well-being.  Sexual activity.  Eating habits.  History of falls.  Memory and ability to understand (cognition).  Work and work Statistician.  Reproductive health. Screening  You may have the following tests or measurements:  Height, weight, and BMI.  Blood pressure.  Lipid and cholesterol levels. These may be checked every 5 years, or more frequently if you are over 76 years old.  Skin check.  Lung cancer screening. You may have this screening every year starting at age 78 if you have a 30-pack-year history of smoking and currently smoke or have quit within the past 15 years.  Fecal occult blood test (FOBT) of the stool. You may have this test every year starting at age 7.  Flexible sigmoidoscopy or colonoscopy. You may have a sigmoidoscopy every 5 years or a colonoscopy every 10 years starting at age  50.  Hepatitis C blood test.  Hepatitis B blood test.  Sexually transmitted disease (STD) testing.  Diabetes screening. This is done by checking your blood sugar (glucose) after you have not eaten for a while (fasting). You may have this done every 1-3 years.  Bone density scan. This is done to screen for osteoporosis. You may have this done starting at age  64.  Mammogram. This may be done every 1-2 years. Talk to your health care provider about how often you should have regular mammograms. Talk with your health care provider about your test results, treatment options, and if necessary, the need for more tests. Vaccines  Your health care provider may recommend certain vaccines, such as:  Influenza vaccine. This is recommended every year.  Tetanus, diphtheria, and acellular pertussis (Tdap, Td) vaccine. You may need a Td booster every 10 years.  Zoster vaccine. You may need this after age 2.  Pneumococcal 13-valent conjugate (PCV13) vaccine. One dose is recommended after age 54.  Pneumococcal polysaccharide (PPSV23) vaccine. One dose is recommended after age 22. Talk to your health care provider about which screenings and vaccines you need and how often you need them. This information is not intended to replace advice given to you by your health care provider. Make sure you discuss any questions you have with your health care provider. Document Released: 03/29/2015 Document Revised: 11/20/2015 Document Reviewed: 01/01/2015 Elsevier Interactive Patient Education  2017 [place] Prevention in the Home Falls can cause injuries. They can happen to people of all ages. There are many things you can do to make your home safe and to help prevent falls. What can I do on the outside of my home?  Regularly fix the edges of walkways and driveways and fix any cracks.  Remove anything that might make you trip as you walk through a door, such as a raised step or threshold.  Trim any bushes or trees on the path to your home.  Use bright outdoor lighting.  Clear any walking paths of anything that might make someone trip, such as rocks or tools.  Regularly check to see if handrails are loose or broken. Make sure that both sides of any steps have handrails.  Any raised decks and porches should have guardrails on the edges.  Have any  leaves, snow, or ice cleared regularly.  Use sand or salt on walking paths during winter.  Clean up any spills in your garage right away. This includes oil or grease spills. What can I do in the bathroom?  Use night lights.  Install grab bars by the toilet and in the tub and shower. Do not use towel bars as grab bars.  Use non-skid mats or decals in the tub or shower.  If you need to sit down in the shower, use a plastic, non-slip stool.  Keep the floor dry. Clean up any water that spills on the floor as soon as it happens.  Remove soap buildup in the tub or shower regularly.  Attach bath mats securely with double-sided non-slip rug tape.  Do not have throw rugs and other things on the floor that can make you trip. What can I do in the bedroom?  Use night lights.  Make sure that you have a light by your bed that is easy to reach.  Do not use any sheets or blankets that are too big for your bed. They should not hang down onto the floor.  Have a firm chair that  has side arms. You can use this for support while you get dressed.  Do not have throw rugs and other things on the floor that can make you trip. What can I do in the kitchen?  Clean up any spills right away.  Avoid walking on wet floors.  Keep items that you use a lot in easy-to-reach places.  If you need to reach something above you, use a strong step stool that has a grab bar.  Keep electrical cords out of the way.  Do not use floor polish or wax that makes floors slippery. If you must use wax, use non-skid floor wax.  Do not have throw rugs and other things on the floor that can make you trip. What can I do with my stairs?  Do not leave any items on the stairs.  Make sure that there are handrails on both sides of the stairs and use them. Fix handrails that are broken or loose. Make sure that handrails are as long as the stairways.  Check any carpeting to make sure that it is firmly attached to the stairs.  Fix any carpet that is loose or worn.  Avoid having throw rugs at the top or bottom of the stairs. If you do have throw rugs, attach them to the floor with carpet tape.  Make sure that you have a light switch at the top of the stairs and the bottom of the stairs. If you do not have them, ask someone to add them for you. What else can I do to help prevent falls?  Wear shoes that:  Do not have high heels.  Have rubber bottoms.  Are comfortable and fit you well.  Are closed at the toe. Do not wear sandals.  If you use a stepladder:  Make sure that it is fully opened. Do not climb a closed stepladder.  Make sure that both sides of the stepladder are locked into place.  Ask someone to hold it for you, if possible.  Clearly mark and make sure that you can see:  Any grab bars or handrails.  First and last steps.  Where the edge of each step is.  Use tools that help you move around (mobility aids) if they are needed. These include:  Canes.  Walkers.  Scooters.  Crutches.  Turn on the lights when you go into a dark area. Replace any light bulbs as soon as they burn out.  Set up your furniture so you have a clear path. Avoid moving your furniture around.  If any of your floors are uneven, fix them.  If there are any pets around you, be aware of where they are.  Review your medicines with your doctor. Some medicines can make you feel dizzy. This can increase your chance of falling. Ask your doctor what other things that you can do to help prevent falls. This information is not intended to replace advice given to you by your health care provider. Make sure you discuss any questions you have with your health care provider. Document Released: 12/27/2008 Document Revised: 08/08/2015 Document Reviewed: 04/06/2014 Elsevier Interactive Patient Education  2017 [employer].

## 2019-10-13 ENCOUNTER — Other Ambulatory Visit: Payer: Self-pay

## 2019-10-13 ENCOUNTER — Encounter: Payer: Self-pay | Admitting: Internal Medicine

## 2019-10-13 ENCOUNTER — Ambulatory Visit (INDEPENDENT_AMBULATORY_CARE_PROVIDER_SITE_OTHER): Payer: Medicare Other | Admitting: Internal Medicine

## 2019-10-13 ENCOUNTER — Ambulatory Visit (INDEPENDENT_AMBULATORY_CARE_PROVIDER_SITE_OTHER): Payer: Medicare Other

## 2019-10-13 VITALS — BP 116/70 | HR 51 | Temp 98.6°F | Ht 61.0 in | Wt 146.2 lb

## 2019-10-13 DIAGNOSIS — M545 Low back pain, unspecified: Secondary | ICD-10-CM

## 2019-10-13 DIAGNOSIS — R269 Unspecified abnormalities of gait and mobility: Secondary | ICD-10-CM

## 2019-10-13 DIAGNOSIS — R197 Diarrhea, unspecified: Secondary | ICD-10-CM

## 2019-10-13 DIAGNOSIS — M549 Dorsalgia, unspecified: Secondary | ICD-10-CM

## 2019-10-13 DIAGNOSIS — I700 Atherosclerosis of aorta: Secondary | ICD-10-CM | POA: Insufficient documentation

## 2019-10-13 DIAGNOSIS — M546 Pain in thoracic spine: Secondary | ICD-10-CM | POA: Diagnosis not present

## 2019-10-13 DIAGNOSIS — R42 Dizziness and giddiness: Secondary | ICD-10-CM | POA: Diagnosis not present

## 2019-10-13 DIAGNOSIS — R001 Bradycardia, unspecified: Secondary | ICD-10-CM

## 2019-10-13 NOTE — Addendum Note (Signed)
Addended by: Orland Mustard on: 10/13/2019 11:12 AM   Modules accepted: Orders

## 2019-10-13 NOTE — Progress Notes (Addendum)
Chief Complaint  Patient presents with  . Follow-up  . Dizziness   F/u with dizziness x 1 week with daughter  1. Dizziness worse in the am and has to put head down and sit down or feels like she will fall and she closes eyes. Denies palpitations but she feels off balance at times she is dizzy  Orthostatics 122/74 HR 51 lying, sitting 130/80 HR 53, standing 122/70 HR 53   2. Fall 1 week ago tripped over object on kitchen floor and landed on hardwood floor has left hip pain and mid back and low back pain nothing tried. She also had large bruise to left thigh which is improving and hematoma improving declines Xray mid and low back tpday  Pain 5/10 today tried Tylenol   Review of Systems  Constitutional: Negative for weight loss.  HENT: Negative for hearing loss.   Eyes: Negative for blurred vision.  Respiratory: Negative for shortness of breath.   Cardiovascular: Negative for chest pain and palpitations.  Musculoskeletal: Positive for falls.  Neurological: Positive for dizziness.   Past Medical History:  Diagnosis Date  . Allergy   . Anxiety   . Bell's palsy    left  . Chronic back pain   . Family history of adverse reaction to anesthesia    pts daughter had severe vomiting 2014   . Hip joint pain    left  . Hypertension   . Osteoarthritis   . PAF (paroxysmal atrial fibrillation) ([place])    a. initially diagnosed 04/2014; b. on Eliquis; c. CHADS2VASc = 3  . Palpitations   . Wears contact lenses   . Wears dentures    partial upper   Past Surgical History:  Procedure Laterality Date  . CHOLECYSTECTOMY    . COLONOSCOPY WITH PROPOFOL N/A 11/05/2014   Procedure: COLONOSCOPY WITH PROPOFOL;  Surgeon: Lucilla Lame, MD;  Location: [facility];  Service: Endoscopy;  Laterality: N/A;  . JOINT REPLACEMENT     left knee x 2 04/2024 and 01/2017 Dr. Rudene Christians   . KNEE ARTHROSCOPY Left 06/28/2014   Procedure: ARTHROSCOPY KNEE WITH PARTIALLATERAL MENISECTOMY AND DEBRIDEMENT;  Surgeon:  Susa Day, MD;  Location: [facility];  Service: Orthopedics;  Laterality: Left;  . TOOTH EXTRACTION    . 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 TOTAL KNEE REVISION Left 02/11/2017   Procedure: TOTAL KNEE REVISION, POLYETHELENE EXCHANGE;  Surgeon: Hessie Knows, MD;  Location: [facility];  Service: Orthopedics;  Laterality: Left;   Family History  Problem Relation Age of Onset  . Heart disease Mother   . Hypertension Mother   . Mental illness Mother   . Diabetes Mother   . Heart disease Father   . Hypertension Sister   . Depression Sister   . Diabetes Sister   . Breast cancer Sister 77  . Bladder Cancer Sister   . Heart disease Brother   . Hypertension Brother   . Arthritis Daughter   . GER disease Daughter   . Hypothyroidism Daughter   . Lupus Daughter   . Depression Daughter   . Cancer Other        colon cancer, breast cancer   . Heart disease Other   . Breast cancer Other   . Depression Other    Social History   Socioeconomic History  . Marital status: Divorced    Spouse name: Not on  file  . Number of children: 2  . Years of education: Not on file  . Highest education level: Not on file  Occupational History  . Not on file  Tobacco Use  . Smoking status: Never Smoker  . Smokeless tobacco: Never Used  Vaping Use  . Vaping Use: Never used  Substance and Sexual Activity  . Alcohol use: No  . Drug use: No  . Sexual activity: Never  Other Topics Concern  . Not on file  Social History Narrative   2 daughters 1 lives [place] the other [place] pt visits both    Lives with daughter in [place] and grand daughter       Social Determinants of Health   Financial Resource Strain:   . Difficulty of Paying Living Expenses:   Food Insecurity:   . Worried About Charity fundraiser in the Last Year:   . Arboriculturist in the Last Year:    Transportation Needs: No Transportation Needs  . Lack of Transportation (Medical): No  . Lack of Transportation (Non-Medical): No  Physical Activity:   . Days of Exercise per Week:   . Minutes of Exercise per Session:   Stress:   . Feeling of Stress :   Social Connections:   . Frequency of Communication with Friends and Family:   . Frequency of Social Gatherings with Friends and Family:   . Attends Religious Services:   . Active Member of Clubs or Organizations:   . Attends Archivist Meetings:   Marland Kitchen Marital Status:   Intimate Partner Violence:   . Fear of Current or Ex-Partner:   . Emotionally Abused:   Marland Kitchen Physically Abused:   . Sexually Abused:    Current Meds  Medication Sig  . acetaminophen (TYLENOL) 500 MG tablet Take 1,000 mg every 8 (eight) hours as needed by mouth for mild pain.  Marland Kitchen apixaban (ELIQUIS) 5 MG TABS tablet Take 1 tablet (5 mg total) by mouth 2 (two) times daily.  . Calcium Carbonate-Vitamin D 600-400 MG-UNIT tablet Take by mouth.  . Cholecalciferol (VITAMIN D) 2000 units tablet Take 2,000 Units by mouth daily.  Marland Kitchen diltiazem (CARDIZEM CD) 120 MG 24 hr capsule Take 1 capsule (120 mg total) by mouth daily.  Marland Kitchen donepezil (ARICEPT) 5 MG tablet Take 5 mg by mouth at bedtime.  Marland Kitchen ezetimibe (ZETIA) 10 MG tablet Take 1 tablet (10 mg total) by mouth daily.  . flecainide (TAMBOCOR) 50 MG tablet TAKE 1 TABLET (50 MG TOTAL) BY MOUTH 2 (TWO) TIMES DAILY.  Marland Kitchen ipratropium (ATROVENT) 0.06 % nasal spray Place 2 sprays into both nostrils 3 (three) times daily.  Marland Kitchen levocetirizine (XYZAL) 5 MG tablet Take 0.5-1 tablets (2.5-5 mg total) by mouth at bedtime as needed for allergies.  Marland Kitchen losartan (COZAAR) 50 MG tablet Take 1.5 tablets (75 mg total) by mouth daily. At night  . Olopatadine HCl 0.2 % SOLN Apply 1 drop to eye daily. Both  . propranolol (INDERAL) 20 MG tablet Take 1 tablet (20 mg total) by mouth 3 (three) times daily as needed (As needed for breakthrough tachycardia (fast  heart rates)).  Marland Kitchen propranolol ER (INDERAL LA) 60 MG 24 hr capsule Take 1 capsule (60 mg total) by mouth daily.  . sodium chloride (OCEAN) 0.65 % SOLN nasal spray Place 1-2 sprays into both nostrils daily as needed for congestion.  Marland Kitchen zolpidem (AMBIEN) 5 MG tablet Take 1 tablet (5 mg total) by mouth at bedtime as needed for  sleep.   Allergies  Allergen Reactions  . Pravastatin     Diarrhea/constipation per pt    . Fish Allergy Nausea And Vomiting, Swelling and Rash   Recent Results (from the past 2160 hour(s))  Basic metabolic panel     Status: Abnormal   Collection Time: 07/22/19 12:53 AM  Result Value Ref Range   Sodium 142 135 - 145 mmol/L   Potassium 3.7 3.5 - 5.1 mmol/L   Chloride 105 98 - 111 mmol/L   CO2 31 22 - 32 mmol/L   Glucose, Bld 104 (H) 70 - 99 mg/dL    Comment: Glucose reference range applies only to samples taken after fasting for at least 8 hours.   BUN 22 8 - 23 mg/dL   Creatinine, Ser 0.74 0.44 - 1.00 mg/dL   Calcium 9.5 8.9 - 10.3 mg/dL   GFR calc non Af Amer >60 >60 mL/min   GFR calc Af Amer >60 >60 mL/min   Anion gap 6 5 - 15    Comment: Performed at [facility], [address] [place], [place] [address]  CBC     Status: None   Collection Time: 07/22/19 12:53 AM  Result Value Ref Range   WBC 6.6 4.0 - 10.5 K/uL   RBC 4.17 3.87 - 5.11 MIL/uL   Hemoglobin 12.7 12.0 - 15.0 g/dL   HCT 38.4 36 - 46 %   MCV 92.1 80.0 - 100.0 fL   MCH 30.5 26.0 - 34.0 pg   MCHC 33.1 30.0 - 36.0 g/dL   RDW 13.2 11.5 - 15.5 %   Platelets 199 150 - 400 K/uL   nRBC 0.0 0.0 - 0.2 %    Comment: Performed at [facility], [address] [place], [place] [address]  Urinalysis, Complete w Microscopic     Status: Abnormal   Collection Time: 07/22/19 12:53 AM  Result Value Ref Range   Color, Urine COLORLESS (A) YELLOW   APPearance CLEAR (A) CLEAR   Specific Gravity, Urine 1.005 1.005 - 1.030   pH 8.0 5.0 - 8.0   Glucose, UA NEGATIVE NEGATIVE mg/dL   Hgb  urine dipstick SMALL (A) NEGATIVE   Bilirubin Urine NEGATIVE NEGATIVE   Ketones, ur NEGATIVE NEGATIVE mg/dL   Protein, ur 30 (A) NEGATIVE mg/dL   Nitrite NEGATIVE NEGATIVE   Leukocytes,Ua NEGATIVE NEGATIVE   RBC / HPF 6-10 0 - 5 RBC/hpf   WBC, UA 0-5 0 - 5 WBC/hpf   Bacteria, UA RARE (A) NONE SEEN   Squamous Epithelial / LPF 0-5 0 - 5   Mucus PRESENT     Comment: Performed at [facility], [place], [place] [address]  Troponin I (High Sensitivity)     Status: None   Collection Time: 07/22/19 12:53 AM  Result Value Ref Range   Troponin I (High Sensitivity) 10 <18 ng/L    Comment: (NOTE) Elevated high sensitivity troponin I (hsTnI) values and significant  changes across serial measurements may suggest ACS but many other  chronic and acute conditions are known to elevate hsTnI results.  Refer to the "Links" section for chest pain algorithms and additional  guidance. Performed at [facility], [place], [place] [address]   Troponin I (High Sensitivity)     Status: None   Collection Time: 07/22/19  5:38 AM  Result Value Ref Range   Troponin I (High Sensitivity) 16 <18 ng/L    Comment: (NOTE) Elevated high sensitivity troponin I (  hsTnI) values and significant  changes across serial measurements may suggest ACS but many other  chronic and acute conditions are known to elevate hsTnI results.  Refer to the "Links" section for chest pain algorithms and additional  guidance. Performed at [facility], [address] [place], [place] [address]   CBC with Differential     Status: None   Collection Time: 07/22/19 11:51 PM  Result Value Ref Range   WBC 6.7 4.0 - 10.5 K/uL   RBC 4.31 3.87 - 5.11 MIL/uL   Hemoglobin 13.2 12.0 - 15.0 g/dL   HCT 39.0 36 - 46 %   MCV 90.5 80.0 - 100.0 fL   MCH 30.6 26.0 - 34.0 pg   MCHC 33.8 30.0 - 36.0 g/dL   RDW 13.2 11.5 - 15.5 %   Platelets 198 150 - 400 K/uL   nRBC 0.0 0.0 - 0.2 %    Neutrophils Relative % 56 %   Neutro Abs 3.8 1.7 - 7.7 K/uL   Lymphocytes Relative 32 %   Lymphs Abs 2.2 0.7 - 4.0 K/uL   Monocytes Relative 8 %   Monocytes Absolute 0.5 0 - 1 K/uL   Eosinophils Relative 3 %   Eosinophils Absolute 0.2 0 - 0 K/uL   Basophils Relative 1 %   Basophils Absolute 0.0 0 - 0 K/uL   Immature Granulocytes 0 %   Abs Immature Granulocytes 0.01 0.00 - 0.07 K/uL    Comment: Performed at [facility], [address] [place], [place] [address]  Comprehensive metabolic panel     Status: Abnormal   Collection Time: 07/23/19  1:14 AM  Result Value Ref Range   Sodium 140 135 - 145 mmol/L   Potassium 3.6 3.5 - 5.1 mmol/L   Chloride 103 98 - 111 mmol/L   CO2 30 22 - 32 mmol/L   Glucose, Bld 98 70 - 99 mg/dL    Comment: Glucose reference range applies only to samples taken after fasting for at least 8 hours.   BUN 19 8 - 23 mg/dL   Creatinine, Ser 0.80 0.44 - 1.00 mg/dL   Calcium 9.2 8.9 - 10.3 mg/dL   Total Protein 7.3 6.5 - 8.1 g/dL   Albumin 4.1 3.5 - 5.0 g/dL   AST 22 15 - 41 U/L   ALT 14 0 - 44 U/L   Alkaline Phosphatase 83 38 - 126 U/L   Total Bilirubin 1.5 (H) 0.3 - 1.2 mg/dL   GFR calc non Af Amer >60 >60 mL/min   GFR calc Af Amer >60 >60 mL/min   Anion gap 7 5 - 15    Comment: Performed at [facility], [place], [place] [address]  Troponin I (High Sensitivity)     Status: None   Collection Time: 07/23/19  1:14 AM  Result Value Ref Range   Troponin I (High Sensitivity) 10 <18 ng/L    Comment: (NOTE) Elevated high sensitivity troponin I (hsTnI) values and significant  changes across serial measurements may suggest ACS but many other  chronic and acute conditions are known to elevate hsTnI results.  Refer to the "Links" section for chest pain algorithms and additional  guidance. Performed at [facility], [address] [place], [place] [address]    Objective  Body mass index is 27.62 kg/m. Wt  Readings from Last 3 Encounters:  10/13/19 146 lb 3.2 oz (66.3 kg)  10/12/19 145 lb (65.8 kg)  09/21/19 145 lb (65.8 kg)  Temp Readings from Last 3 Encounters:  10/13/19 98.6 F (37 C) (Oral)  07/27/19 (!) 97.3 F (36.3 C) (Temporal)  07/22/19 97.9 F (36.6 C) (Oral)   BP Readings from Last 3 Encounters:  10/13/19 116/70  07/31/19 138/66  07/27/19 126/82   Pulse Readings from Last 3 Encounters:  10/13/19 51  07/31/19 (!) 58  07/27/19 (!) 57    Physical Exam Vitals and nursing note reviewed.  Constitutional:      Appearance: Normal appearance. She is well-developed and well-groomed.  HENT:     Head: Normocephalic and atraumatic.  Eyes:     Conjunctiva/sclera: Conjunctivae normal.     Pupils: Pupils are equal, round, and reactive to light.  Cardiovascular:     Rate and Rhythm: Regular rhythm. Bradycardia present.     Heart sounds: No murmur heard.      Comments: No Afib noted today  Pulmonary:     Effort: Pulmonary effort is normal.     Breath sounds: Normal breath sounds.  Musculoskeletal:     Thoracic back: Tenderness present.     Lumbar back: Tenderness present. Negative right straight leg raise test and negative left straight leg raise test.       Back:       Legs:  Skin:    General: Skin is warm and dry.  Neurological:     General: No focal deficit present.     Mental Status: She is alert and oriented to person, place, and time. Mental status is at baseline.     Gait: Gait normal.  Psychiatric:        Attention and Perception: Attention and perception normal.        Mood and Affect: Mood and affect normal.        Speech: Speech normal.        Behavior: Behavior normal. Behavior is cooperative.        Thought Content: Thought content normal.        Cognition and Memory: Cognition and memory normal.        Judgment: Judgment normal.     Assessment  Plan  Dizziness etiology could be bradycardia, BPPV, orthostatics negative today, meds age related    Pt thinks related diltiazem and lower salt diet CC Dr. Rockey Situ to see what he thinks Abnormal gait Declines meclizine today  CT head neg 07/22/19  Pt denies palpitations less likely Afib as cause  Consider neurology f/u and vestibular pt if BPPV considered if orthostatics negative  Consider f/u cards bradycardia  CC Dr. Rockey Situ and Dr. Manuella Ghazi   Mid back pain Low back pain, unspecified back pain laterality, unspecified chronicity, unspecified whether sciatica present  Consider Xray mid and low back in the future  Prn Tylenol  voltaren gel  Lidocaine patch   Diarrhea x 1 month as of 05/24/20 Referred [facility]  Provider: Dr. Olivia Mackie McLean-Scocuzza-[facility] Medicine

## 2019-10-13 NOTE — Patient Instructions (Addendum)
Consider Xray low back  Lidocaine pain patch  voltaren gel  Heat   Dizziness Dizziness is a common problem. It is a feeling of unsteadiness or light-headedness. You may feel like you are about to faint. Dizziness can lead to injury if you stumble or fall. Anyone can become dizzy, but dizziness is more common in older adults. This condition can be caused by a number of things, including medicines, dehydration, or illness. Follow these instructions at home: Eating and drinking  Drink enough fluid to keep your urine clear or pale yellow. This helps to keep you from becoming dehydrated. Try to drink more clear fluids, such as water.  Do not drink alcohol.  Limit your caffeine intake if told to do so by your health care provider. Check ingredients and nutrition facts to see if a food or beverage contains caffeine.  Limit your salt (sodium) intake if told to do so by your health care provider. Check ingredients and nutrition facts to see if a food or beverage contains sodium. Activity  Avoid making quick movements. ? Rise slowly from chairs and steady yourself until you feel okay. ? In the morning, first sit up on the side of the bed. When you feel okay, stand slowly while you hold onto something until you know that your balance is fine.  If you need to stand in one place for a long time, move your legs often. Tighten and relax the muscles in your legs while you are standing.  Do not drive or use heavy machinery if you feel dizzy.  Avoid bending down if you feel dizzy. Place items in your home so that they are easy for you to reach without leaning over. Lifestyle  Do not use any products that contain nicotine or tobacco, such as cigarettes and e-cigarettes. If you need help quitting, ask your health care provider.  Try to reduce your stress level by using methods such as yoga or meditation. Talk with your health care provider if you need help to manage your stress. General  instructions  Watch your dizziness for any changes.  Take over-the-counter and prescription medicines only as told by your health care provider. Talk with your health care provider if you think that your dizziness is caused by a medicine that you are taking.  Tell a friend or a family member that you are feeling dizzy. If he or she notices any changes in your behavior, have this person call your health care provider.  Keep all follow-up visits as told by your health care provider. This is important. Contact a health care provider if:  Your dizziness does not go away.  Your dizziness or light-headedness gets worse.  You feel nauseous.  You have reduced hearing.  You have new symptoms.  You are unsteady on your feet or you feel like the room is spinning. Get help right away if:  You vomit or have diarrhea and are unable to eat or drink anything.  You have problems talking, walking, swallowing, or using your arms, hands, or legs.  You feel generally weak.  You are not thinking clearly or you have trouble forming sentences. It may take a friend or family member to notice this.  You have chest pain, abdominal pain, shortness of breath, or sweating.  Your vision changes.  You have any bleeding.  You have a severe headache.  You have neck pain or a stiff neck.  You have a fever. These symptoms may represent a serious problem that is an  emergency. Do not wait to see if the symptoms will go away. Get medical help right away. Call your local emergency services (911 in the [place] Do not drive yourself to the hospital. Summary  Dizziness is a feeling of unsteadiness or light-headedness. This condition can be caused by a number of things, including medicines, dehydration, or illness.  Anyone can become dizzy, but dizziness is more common in older adults.  Drink enough fluid to keep your urine clear or pale yellow. Do not drink alcohol.  Avoid making quick movements if you feel  dizzy. Monitor your dizziness for any changes. This information is not intended to replace advice given to you by your health care provider. Make sure you discuss any questions you have with your health care provider. Document Revised: 03/05/2017 Document Reviewed: 04/04/2016 Elsevier Patient Education  2020 [place] or Strain Rehab Ask your health care provider which exercises are safe for you. Do exercises exactly as told by your health care provider and adjust them as directed. It is normal to feel mild stretching, pulling, tightness, or discomfort as you do these exercises. Stop right away if you feel sudden pain or your pain gets worse. Do not begin these exercises until told by your health care provider. Stretching and range-of-motion exercises These exercises warm up your muscles and joints and improve the movement and flexibility of your back. These exercises also help to relieve pain, numbness, and tingling. Lumbar rotation  1. Lie on your back on a firm surface and bend your knees. 2. Straighten your arms out to your sides so each arm forms a 90-degree angle (right angle) with a side of your body. 3. Slowly move (rotate) both of your knees to one side of your body until you feel a stretch in your lower back (lumbar). Try not to let your shoulders lift off the floor. 4. Hold this position for __________ seconds. 5. Tense your abdominal muscles and slowly move your knees back to the starting position. 6. Repeat this exercise on the other side of your body. Repeat __________ times. Complete this exercise __________ times a day. Single knee to chest  1. Lie on your back on a firm surface with both legs straight. 2. Bend one of your knees. Use your hands to move your knee up toward your chest until you feel a gentle stretch in your lower back and buttock. ? Hold your leg in this position by holding on to the front of your knee. ? Keep your other leg as straight  as possible. 3. Hold this position for __________ seconds. 4. Slowly return to the starting position. 5. Repeat with your other leg. Repeat __________ times. Complete this exercise __________ times a day. Prone extension on elbows  1. Lie on your abdomen on a firm surface (prone position). 2. Prop yourself up on your elbows. 3. Use your arms to help lift your chest up until you feel a gentle stretch in your abdomen and your lower back. ? This will place some of your body weight on your elbows. If this is uncomfortable, try stacking pillows under your chest. ? Your hips should stay down, against the surface that you are lying on. Keep your hip and back muscles relaxed. 4. Hold this position for __________ seconds. 5. Slowly relax your upper body and return to the starting position. Repeat __________ times. Complete this exercise __________ times a day. Strengthening exercises These exercises build strength and endurance in your back.  Endurance is the ability to use your muscles for a long time, even after they get tired. Pelvic tilt This exercise strengthens the muscles that lie deep in the abdomen. 1. Lie on your back on a firm surface. Bend your knees and keep your feet flat on the floor. 2. Tense your abdominal muscles. Tip your pelvis up toward the ceiling and flatten your lower back into the floor. ? To help with this exercise, you may place a small towel under your lower back and try to push your back into the towel. 3. Hold this position for __________ seconds. 4. Let your muscles relax completely before you repeat this exercise. Repeat __________ times. Complete this exercise __________ times a day. Alternating arm and leg raises  1. Get on your hands and knees on a firm surface. If you are on a hard floor, you may want to use padding, such as an exercise mat, to cushion your knees. 2. Line up your arms and legs. Your hands should be directly below your shoulders, and your knees  should be directly below your hips. 3. Lift your left leg behind you. At the same time, raise your right arm and straighten it in front of you. ? Do not lift your leg higher than your hip. ? Do not lift your arm higher than your shoulder. ? Keep your abdominal and back muscles tight. ? Keep your hips facing the ground. ? Do not arch your back. ? Keep your balance carefully, and do not hold your breath. 4. Hold this position for __________ seconds. 5. Slowly return to the starting position. 6. Repeat with your right leg and your left arm. Repeat __________ times. Complete this exercise __________ times a day. Abdominal set with straight leg raise  1. Lie on your back on a firm surface. 2. Bend one of your knees and keep your other leg straight. 3. Tense your abdominal muscles and lift your straight leg up, 4-6 inches (10-15 cm) off the ground. 4. Keep your abdominal muscles tight and hold this position for __________ seconds. ? Do not hold your breath. ? Do not arch your back. Keep it flat against the ground. 5. Keep your abdominal muscles tense as you slowly lower your leg back to the starting position. 6. Repeat with your other leg. Repeat __________ times. Complete this exercise __________ times a day. Single leg lower with bent knees 1. Lie on your back on a firm surface. 2. Tense your abdominal muscles and lift your feet off the floor, one foot at a time, so your knees and hips are bent in 90-degree angles (right angles). ? Your knees should be over your hips and your lower legs should be parallel to the floor. 3. Keeping your abdominal muscles tense and your knee bent, slowly lower one of your legs so your toe touches the ground. 4. Lift your leg back up to return to the starting position. ? Do not hold your breath. ? Do not let your back arch. Keep your back flat against the ground. 5. Repeat with your other leg. Repeat __________ times. Complete this exercise __________ times a  day. Posture and body mechanics Good posture and healthy body mechanics can help to relieve stress in your body's tissues and joints. Body mechanics refers to the movements and positions of your body while you do your daily activities. Posture is part of body mechanics. Good posture means:  Your spine is in its natural S-curve position (neutral).  Your shoulders are pulled  back slightly.  Your head is not tipped forward. Follow these guidelines to improve your posture and body mechanics in your everyday activities. Standing   When standing, keep your spine neutral and your feet about hip width apart. Keep a slight bend in your knees. Your ears, shoulders, and hips should line up.  When you do a task in which you stand in one place for a long time, place one foot up on a stable object that is 2-4 inches (5-10 cm) high, such as a footstool. This helps keep your spine neutral. Sitting   When sitting, keep your spine neutral and keep your feet flat on the floor. Use a footrest, if necessary, and keep your thighs parallel to the floor. Avoid rounding your shoulders, and avoid tilting your head forward.  When working at a desk or a computer, keep your desk at a height where your hands are slightly lower than your elbows. Slide your chair under your desk so you are close enough to maintain good posture.  When working at a computer, place your monitor at a height where you are looking straight ahead and you do not have to tilt your head forward or downward to look at the screen. Resting  When lying down and resting, avoid positions that are most painful for you.  If you have pain with activities such as sitting, bending, stooping, or squatting, lie in a position in which your body does not bend very much. For example, avoid curling up on your side with your arms and knees near your chest (fetal position).  If you have pain with activities such as standing for a long time or reaching with your  arms, lie with your spine in a neutral position and bend your knees slightly. Try the following positions: ? Lying on your side with a pillow between your knees. ? Lying on your back with a pillow under your knees. Lifting   When lifting objects, keep your feet at least shoulder width apart and tighten your abdominal muscles.  Bend your knees and hips and keep your spine neutral. It is important to lift using the strength of your legs, not your back. Do not lock your knees straight out.  Always ask for help to lift heavy or awkward objects. This information is not intended to replace advice given to you by your health care provider. Make sure you discuss any questions you have with your health care provider. Document Revised: 06/24/2018 Document Reviewed: 03/24/2018 Elsevier Patient Education  [place].   Thoracic Strain Rehab Ask your health care provider which exercises are safe for you. Do exercises exactly as told by your health care provider and adjust them as directed. It is normal to feel mild stretching, pulling, tightness, or discomfort as you do these exercises. Stop right away if you feel sudden pain or your pain gets worse. Do not begin these exercises until told by your health care provider. Stretching and range-of-motion exercise This exercise warms up your muscles and joints and improves the movement and flexibility of your back and shoulders. This exercise also helps to relieve pain. Chest and spine stretch  7. Lie down on your back on a firm surface. 8. Roll a towel or a small blanket so it is about 4 inches (10 cm) in diameter. 9. Put the towel lengthwise under the middle of your back so it is under your spine, but not under your shoulder blades. 10. Put your hands behind your head and let  your elbows fall to your sides. This will increase your stretch. 11. Take a deep breath (inhale). 12. Hold for __________ seconds. 13. Relax after you breathe out  (exhale). Repeat __________ times. Complete this exercise __________ times a day. Strengthening exercises These exercises build strength and endurance in your back and your shoulder blade muscles. Endurance is the ability to use your muscles for a long time, even after they get tired. Alternating arm and leg raises  6. Get on your hands and knees on a firm surface. If you are on a hard floor, you may want to use padding, such as an exercise mat, to cushion your knees. 7. Line up your arms and legs. Your hands should be directly below your shoulders, and your knees should be directly below your hips. 8. Lift your left leg behind you. At the same time, raise your right arm and straighten it in front of you. ? Do not lift your leg higher than your hip. ? Do not lift your arm higher than your shoulder. ? Keep your abdominal and back muscles tight. ? Keep your hips facing the ground. ? Do not arch your back. ? Keep your balance carefully, and do not hold your breath. 9. Hold for __________ seconds. 10. Slowly return to the starting position and repeat with your right leg and your left arm. Repeat __________ times. Complete this exercise __________ times a day. Straight arm rows This exercise is also called shoulder extension exercise. 6. Stand with your feet shoulder width apart. 7. Secure an exercise band to a stable object in front of you so the band is at or above shoulder height. 8. Hold one end of the exercise band in each hand. 9. Straighten your elbows and lift your hands up to shoulder height. 10. Step back, away from the secured end of the exercise band, until the band stretches. 11. Squeeze your shoulder blades together and pull your hands down to the sides of your thighs. Stop when your hands are straight down by your sides. This is shoulder extension. Do not let your hands go behind your body. 12. Hold for __________ seconds. 13. Slowly return to the starting position. Repeat  __________ times. Complete this exercise __________ times a day. Prone shoulder external rotation 5. Lie on your abdomen on a firm bed so your left / right forearm hangs over the edge of the bed and your upper arm is on the bed, straight out from your body. This is the prone position. ? Your elbow should be bent. ? Your palm should be facing your feet. 6. If instructed, hold a __________ weight in your hand. 7. Squeeze your shoulder blade toward the middle of your back. Do not let your shoulder lift toward your ear. 8. Keep your elbow bent in a 90-degree angle (right angle) while you slowly move your forearm up toward the ceiling. Move your forearm up to the height of the bed, toward your head. This is external rotation. ? Your upper arm should not move. ? At the top of the movement, your palm should face the floor. 9. Hold for __________ seconds. 10. Slowly return to the starting position and relax your muscles. Repeat __________ times. Complete this exercise __________ times a day. Rowing scapular retraction This is an exercise in which the shoulder blades (scapulae) are pulled toward each other (retraction). 7. Sit in a stable chair without armrests, or stand up. 8. Secure an exercise band to a stable object in front of you  so the band is at shoulder height. 9. Hold one end of the exercise band in each hand. Your palms should face down. 10. Bring your arms out straight in front of you. 11. Step back, away from the secured end of the exercise band, until the band stretches. 12. Pull the band backward. As you do this, bend your elbows and squeeze your shoulder blades together, but avoid letting the rest of your body move. Do not shrug your shoulders upward while you do this. 13. Stop when your elbows are at your sides or slightly behind your body. 14. Hold for __________ seconds. 15. Slowly straighten your arms to return to the starting position. Repeat __________ times. Complete this  exercise __________ times a day. Posture and body mechanics Good posture and healthy body mechanics can help to relieve stress in your body's tissues and joints. Body mechanics refers to the movements and positions of your body while you do your daily activities. Posture is part of body mechanics. Good posture means:  Your spine is in its natural S-curve position (neutral).  Your shoulders are pulled back slightly.  Your head is not tipped forward. Follow these guidelines to improve your posture and body mechanics in your everyday activities. Standing   When standing, keep your spine neutral and your feet about hip width apart. Keep a slight bend in your knees. Your ears, shoulders, and hips should line up with each other.  When you do a task in which you lean forward while standing in one place for a long time, place one foot up on a stable object that is 2-4 inches (5-10 cm) high, such as a footstool. This helps keep your spine neutral. Sitting   When sitting, keep your spine neutral and keep your feet flat on the floor. Use a footrest, if necessary, and keep your thighs parallel to the floor. Avoid rounding your shoulders, and avoid tilting your head forward.  When working at a desk or a computer, keep your desk at a height where your hands are slightly lower than your elbows. Slide your chair under your desk so you are close enough to maintain good posture.  When working at a computer, place your monitor at a height where you are looking straight ahead and you do not have to tilt your head forward or downward to look at the screen. Resting When lying down and resting, avoid positions that are most painful for you.  If you have pain with activities such as sitting, bending, stooping, or squatting (flexion-basedactivities), lie in a position in which your body does not bend very much. For example, avoid curling up on your side with your arms and knees near your chest (fetal  position).  If you have pain with activities such as standing for a long time or reaching with your arms (extension-basedactivities), lie with your spine in a neutral position and bend your knees slightly. Try the following positions: ? Lie on your side with a pillow between your knees. ? Lie on your back with a pillow under your knees.  Lifting   When lifting objects, keep your feet at least shoulder width apart and tighten your abdominal muscles.  Bend your knees and hips and keep your spine neutral. It is important to lift using the strength of your legs, not your back. Do not lock your knees straight out.  Always ask for help to lift heavy or awkward objects. This information is not intended to replace advice given to you by  your health care provider. Make sure you discuss any questions you have with your health care provider. Document Revised: 06/24/2018 Document Reviewed: 04/11/2018 Elsevier Patient Education  [place].

## 2019-10-18 ENCOUNTER — Encounter: Payer: Self-pay | Admitting: Obstetrics and Gynecology

## 2019-10-19 ENCOUNTER — Encounter: Payer: Self-pay | Admitting: Obstetrics and Gynecology

## 2019-10-19 ENCOUNTER — Ambulatory Visit (INDEPENDENT_AMBULATORY_CARE_PROVIDER_SITE_OTHER): Payer: Medicare Other | Admitting: Obstetrics and Gynecology

## 2019-10-19 VITALS — BP 150/69 | HR 60 | Ht 61.0 in | Wt 146.8 lb

## 2019-10-19 DIAGNOSIS — R102 Pelvic and perineal pain: Secondary | ICD-10-CM | POA: Diagnosis not present

## 2019-10-19 DIAGNOSIS — N852 Hypertrophy of uterus: Secondary | ICD-10-CM

## 2019-10-19 NOTE — Patient Instructions (Signed)
Pelvic Pain, Female Pelvic pain is pain in your lower belly (abdomen), below your belly button and between your hips. The pain may start suddenly (be acute), keep coming back (be recurring), or last a long time (become chronic). Pelvic pain that lasts longer than 6 months is called chronic pelvic pain. There are many causes of pelvic pain. Sometimes the cause of pelvic pain is not known. Follow these instructions at home:   Take over-the-counter and prescription medicines only as told by your doctor.  Rest as told by your doctor.  Do not have sex if it hurts.  Keep a journal of your pelvic pain. Write down: ? When the pain started. ? Where the pain is located. ? What seems to make the pain better or worse, such as food or your period (menstrual cycle). ? Any symptoms you have along with the pain.  Keep all follow-up visits as told by your doctor. This is important. Contact a doctor if:  Medicine does not help your pain.  Your pain comes back.  You have new symptoms.  You have unusual discharge or bleeding from your vagina.  You have a fever or chills.  You are having trouble pooping (constipation).  You have blood in your pee (urine) or poop (stool).  Your pee smells bad.  You feel weak or light-headed. Get help right away if:  You have sudden pain that is very bad.  Your pain keeps getting worse.  You have very bad pain and also have any of these symptoms: ? A fever. ? Feeling sick to your stomach (nausea). ? Throwing up (vomiting). ? Being very sweaty.  You pass out (lose consciousness). Summary  Pelvic pain is pain in your lower belly (abdomen), below your belly button and between your hips.  There are many possible causes of pelvic pain.  Keep a journal of your pelvic pain. This information is not intended to replace advice given to you by your health care provider. Make sure you discuss any questions you have with your health care provider. Document  Revised: 08/18/2017 Document Reviewed: 08/18/2017 Elsevier Patient Education  2020 [employer]  

## 2019-10-19 NOTE — Progress Notes (Signed)
Pt present as referral for cysts. Pt stated that she noticed pain about 2-3 months ago in the lower abd with pressure.

## 2019-10-19 NOTE — Progress Notes (Signed)
GYNECOLOGY PROGRESS NOTE  Subjective:    Patient ID: Gina Frazier, female    DOB: 11-20-1945, 74 y.o.   MRN: [id number]  HPI  Patient is a 74 y.o. [id number] female who presents as a referral from her PCP (Dr. Olivia Mackie McLean-Scocuzza at [facility])  for complaints of intermittent pelvic pain over the past 2-3 months.  Tylenol has helped to relieved her pain. Currently not experiencing any pain. She had a CT scan that was performed in April noting some uterine enlargement. Notes that she was unable to get the ultrasound that was ordered secondary to being out of town caring for her mother.  She denies any postmenopausal bleeding or vaginal discharge.    Family History  Problem Relation Age of Onset  . Heart disease Mother   . Hypertension Mother   . Mental illness Mother   . Diabetes Mother   . Heart disease Father   . Hypertension Sister   . Depression Sister   . Diabetes Sister   . Breast cancer Sister 26  . Bladder Cancer Sister   . Heart disease Brother   . Hypertension Brother   . Arthritis Daughter   . GER disease Daughter   . Hypothyroidism Daughter   . Lupus Daughter   . Depression Daughter   . Cancer Other        colon cancer, breast cancer   . Heart disease Other   . Breast cancer Other   . Depression Other     Past Surgical History:  Procedure Laterality Date  . CHOLECYSTECTOMY    . COLONOSCOPY WITH PROPOFOL N/A 11/05/2014   Procedure: COLONOSCOPY WITH PROPOFOL;  Surgeon: Lucilla Lame, MD;  Location: [place];  Service: Endoscopy;  Laterality: N/A;  . JOINT REPLACEMENT     left knee x 2 04/2024 and 01/2017 Dr. Rudene Christians   . KNEE ARTHROSCOPY Left 06/28/2014   Procedure: ARTHROSCOPY KNEE WITH PARTIALLATERAL MENISECTOMY AND DEBRIDEMENT;  Surgeon: Susa Day, MD;  Location: [facility];  Service: Orthopedics;  Laterality: Left;  . TOOTH EXTRACTION    . 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 TOTAL KNEE REVISION Left 02/11/2017   Procedure: TOTAL KNEE REVISION, POLYETHELENE EXCHANGE;  Surgeon: Hessie Knows, MD;  Location: [facility];  Service: Orthopedics;  Laterality: Left;    Family History  Problem Relation Age of Onset  . Heart disease Mother   . Hypertension Mother   . Mental illness Mother   . Diabetes Mother   . Heart disease Father   . Hypertension Sister   . Depression Sister   . Diabetes Sister   . Breast cancer Sister 98  . Bladder Cancer Sister   . Heart disease Brother   . Hypertension Brother   . Arthritis Daughter   . GER disease Daughter   . Hypothyroidism Daughter   . Lupus Daughter   . Depression Daughter   . Cancer Other        colon cancer, breast cancer   . Heart disease Other   . Breast cancer Other   . Depression Other     Social History   Socioeconomic History  . Marital status: Divorced    Spouse name: Not on file  . Number of children: 2  . Years of education: Not on file  . Highest education level: Not on file  Occupational History  . Not on  file  Tobacco Use  . Smoking status: Never Smoker  . Smokeless tobacco: Never Used  Vaping Use  . Vaping Use: Never used  Substance and Sexual Activity  . Alcohol use: No  . Drug use: No  . Sexual activity: Never  Other Topics Concern  . Not on file  Social History Narrative   2 daughters 1 lives [place] the other [place] pt visits both    Lives with daughter in [place] and grand daughter       Social Determinants of Health   Financial Resource Strain:   . Difficulty of Paying Living Expenses:   Food Insecurity:   . Worried About Charity fundraiser in the Last Year:   . Arboriculturist in the Last Year:   Transportation Needs: No Transportation Needs  . Lack of Transportation (Medical): No  . Lack of Transportation (Non-Medical): No  Physical Activity:   . Days of Exercise per Week:   . Minutes of Exercise per  Session:   Stress:   . Feeling of Stress :   Social Connections:   . Frequency of Communication with Friends and Family:   . Frequency of Social Gatherings with Friends and Family:   . Attends Religious Services:   . Active Member of Clubs or Organizations:   . Attends Archivist Meetings:   Marland Kitchen Marital Status:   Intimate Partner Violence:   . Fear of Current or Ex-Partner:   . Emotionally Abused:   Marland Kitchen Physically Abused:   . Sexually Abused:    Current Outpatient Medications on File Prior to Visit  Medication Sig Dispense Refill  . acetaminophen (TYLENOL) 500 MG tablet Take 1,000 mg every 8 (eight) hours as needed by mouth for mild pain.    Marland Kitchen apixaban (ELIQUIS) 5 MG TABS tablet Take 1 tablet (5 mg total) by mouth 2 (two) times daily. 180 tablet 1  . Calcium Carbonate-Vitamin D 600-400 MG-UNIT tablet Take by mouth.    . Cholecalciferol (VITAMIN D) 2000 units tablet Take 2,000 Units by mouth daily.    Marland Kitchen diltiazem (CARDIZEM CD) 120 MG 24 hr capsule Take 1 capsule (120 mg total) by mouth daily. 90 capsule 3  . donepezil (ARICEPT) 5 MG tablet Take 5 mg by mouth at bedtime.    Marland Kitchen ezetimibe (ZETIA) 10 MG tablet Take 1 tablet (10 mg total) by mouth daily. 90 tablet 3  . flecainide (TAMBOCOR) 50 MG tablet TAKE 1 TABLET (50 MG TOTAL) BY MOUTH 2 (TWO) TIMES DAILY. 180 tablet 3  . ipratropium (ATROVENT) 0.06 % nasal spray Place 2 sprays into both nostrils 3 (three) times daily. 15 mL 12  . levocetirizine (XYZAL) 5 MG tablet Take 0.5-1 tablets (2.5-5 mg total) by mouth at bedtime as needed for allergies. 90 tablet 3  . losartan (COZAAR) 50 MG tablet Take 1.5 tablets (75 mg total) by mouth daily. At night 135 tablet 3  . Olopatadine HCl 0.2 % SOLN Apply 1 drop to eye daily. Both 2.5 mL 12  . propranolol (INDERAL) 20 MG tablet Take 1 tablet (20 mg total) by mouth 3 (three) times daily as needed (As needed for breakthrough tachycardia (fast heart rates)). 90 tablet 6  . propranolol ER (INDERAL  LA) 60 MG 24 hr capsule Take 1 capsule (60 mg total) by mouth daily. 90 capsule 3  . sodium chloride (OCEAN) 0.65 % SOLN nasal spray Place 1-2 sprays into both nostrils daily as needed for congestion. 30 mL 12  .  zolpidem (AMBIEN) 5 MG tablet Take 1 tablet (5 mg total) by mouth at bedtime as needed for sleep. 90 tablet 1   No current facility-administered medications on file prior to visit.    Allergies  Allergen Reactions  . Pravastatin     Diarrhea/constipation per pt    . Fish Allergy Nausea And Vomiting, Swelling and Rash     Review of Systems Constitutional: negative for chills, fatigue, fevers and sweats Eyes: negative for irritation, redness and visual disturbance Ears, nose, mouth, throat, and face: negative for hearing loss, nasal congestion, snoring and tinnitus Respiratory: negative for asthma, cough, sputum Cardiovascular: negative for chest pain, dyspnea, exertional chest pressure/discomfort, irregular heart beat, palpitations and syncope Gastrointestinal: negative for abdominal pain, nausea and vomiting. Positive for change in bowel habits (loose stools).  Genitourinary: negative for abnormal menstrual periods, genital lesions, sexual problems and vaginal discharge, dysuria and urinary incontinence.   Positive for pelvic pain x 3 months (see HPI). Integument/breast: negative for breast lump, breast tenderness and nipple discharge.   Hematologic/lymphatic: negative for bleeding and easy bruising Musculoskeletal:negative for back pain and muscle weakness Neurological: negative for dizziness, headaches, vertigo and weakness Endocrine: negative for diabetic symptoms including polydipsia, polyuria and skin dryness Allergic/Immunologic: negative for hay fever and urticaria      Objective:   Blood pressure (!) 150/69, pulse 60, height 5\' 1"  (1.549 m), weight 146 lb 12.8 oz (66.6 kg). General appearance: alert and no distress Abdomen: soft, non-tender; bowel sounds normal; no  masses,  no organomegaly Pelvic: external genitalia normal, rectovaginal septum normal.  Vagina without discharge, atrophic.  Cervix normal appearing, no lesions and no motion tenderness.  Uterus mobile, nontender, normal shape and size.  Adnexae non-palpable, nontender bilaterally.  Extremities: extremities normal, atraumatic, no cyanosis or edema Neurologic: Grossly normal    Imaging:  CLINICAL DATA:  Abdominal pain for 1 month.  EXAM: CT ABDOMEN AND PELVIS WITHOUT CONTRAST  TECHNIQUE: Multidetector CT imaging of the abdomen and pelvis was performed following the standard protocol without IV contrast.  COMPARISON:  None.  FINDINGS: Lower chest: Normal.  Hepatobiliary: No focal liver abnormality is seen. Status post cholecystectomy. No biliary dilatation.  Pancreas: Unremarkable. No pancreatic ductal dilatation or surrounding inflammatory changes.  Spleen: Normal in size without focal abnormality.  Adrenals/Urinary Tract: Adrenal glands are unremarkable. Kidneys are normal, without renal calculi, focal lesion, or hydronephrosis. Bladder is unremarkable.  Stomach/Bowel: Stomach is within normal limits. Appendix appears normal. No evidence of bowel wall thickening, distention, or inflammatory changes.  Vascular/Lymphatic: Aortic atherosclerosis. No enlarged abdominal or pelvic lymph nodes.  Reproductive: The uterus is enlarged, measuring [phone] cm. No definable discrete mass. The ovaries are normal.  Other: No abdominal wall hernia or abnormality. No abdominopelvic ascites.  Musculoskeletal: No acute abnormality. 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 Disc space narrowing at L5-S1.  IMPRESSION: 1. No acute abnormalities of the abdomen or pelvis. 2. Enlarged uterus of unknown etiology. I recommend pelvic ultrasound for further evaluation. 3. Aortic atherosclerosis.  Aortic Atherosclerosis  (ICD10-I70.0).   Electronically Signed   By: Lorriane Shire M.D.   On: 07/04/2019 08:31   Assessment:   Pelvic pain  Uterine enlargement  Plan:   - No significant findings present on CT scan to explain patient's pain. Uterine size is actually wnl.  However, will order pelvic ultrasound as this is the best modality to assess the pelvic organs.   If no findings, can refer to GI to further workup pelvic pain as patient  also noting some issues with loose stools.   Rubie Maid, MD [facility]

## 2019-10-23 ENCOUNTER — Encounter: Payer: Self-pay | Admitting: Obstetrics and Gynecology

## 2019-10-24 ENCOUNTER — Ambulatory Visit (INDEPENDENT_AMBULATORY_CARE_PROVIDER_SITE_OTHER): Payer: Medicare Other

## 2019-10-24 DIAGNOSIS — R102 Pelvic and perineal pain: Secondary | ICD-10-CM | POA: Diagnosis not present

## 2019-10-24 DIAGNOSIS — N852 Hypertrophy of uterus: Secondary | ICD-10-CM

## 2019-11-01 ENCOUNTER — Ambulatory Visit: Payer: Medicare Other | Admitting: Internal Medicine

## 2019-11-21 ENCOUNTER — Telehealth: Payer: Self-pay | Admitting: Physician Assistant

## 2019-11-21 NOTE — Telephone Encounter (Signed)
Pt c/o BP issue: STAT if pt c/o blurred vision, one-sided weakness or slurred speech  1. What are your last 5 BP readings?  Just now -118/80 Yesterday 120/70  2. Are you having any other symptoms (ex. Dizziness, headache, blurred vision, passed out)? Dizziness, feeling like shes gonna faint  3. What is your BP issue? Low

## 2019-11-21 NOTE — Telephone Encounter (Signed)
Returned call to patient who reports having "low BP."   Bp as reported 114/70 this AM up to 150/70+ , and Just now -118/80 Yesterday 120/70.  Explained to patient the blood pressure readings reported above are within normal range, but since she is used to a higher pressure she may not feel well until her body adjusts to the normal range.   Instructed patient to drink adequate amounts of water and change position from lying to sitting to standing slowly.  Pt will check her Bp twice daily and call the office with readings this Friday (11/24/2019).  Pt instructed to call the office if she feels worse before Friday.  Pt verbalized understanding of instructions as provided.

## 2019-12-11 ENCOUNTER — Ambulatory Visit (INDEPENDENT_AMBULATORY_CARE_PROVIDER_SITE_OTHER): Payer: Medicare Other

## 2019-12-11 ENCOUNTER — Other Ambulatory Visit: Payer: Self-pay

## 2019-12-11 DIAGNOSIS — Z23 Encounter for immunization: Secondary | ICD-10-CM | POA: Diagnosis not present

## 2019-12-11 DIAGNOSIS — M810 Age-related osteoporosis without current pathological fracture: Secondary | ICD-10-CM | POA: Diagnosis not present

## 2019-12-11 MED ORDER — DENOSUMAB 60 MG/ML ~~LOC~~ SOSY
60.0000 mg | PREFILLED_SYRINGE | Freq: Once | SUBCUTANEOUS | Status: AC
  Administered 2019-12-11: 60 mg via SUBCUTANEOUS

## 2019-12-11 NOTE — Addendum Note (Signed)
Addended by: Thressa Sheller on: 12/11/2019 02:53 PM   Modules accepted: Orders

## 2019-12-11 NOTE — Progress Notes (Addendum)
Patient received high dose flu shot in left deltoid and Prolia in the right arm.  Patient tolerated both shots well.   Agree Dr/ Olivia Mackie McLean-Scocuzza

## 2019-12-22 ENCOUNTER — Other Ambulatory Visit: Payer: Self-pay | Admitting: Internal Medicine

## 2019-12-22 DIAGNOSIS — M5416 Radiculopathy, lumbar region: Secondary | ICD-10-CM

## 2019-12-22 MED ORDER — TIZANIDINE HCL 2 MG PO TABS: 2.0000 mg | ORAL_TABLET | Freq: Every evening | ORAL | 2 refills | Status: DC | PRN

## 2019-12-25 DIAGNOSIS — G250 Essential tremor: Secondary | ICD-10-CM | POA: Diagnosis not present

## 2019-12-26 ENCOUNTER — Telehealth: Payer: Self-pay | Admitting: Cardiovascular Disease

## 2019-12-26 NOTE — Telephone Encounter (Signed)
Pt c/o medication issue:  1. Name of Medication: cardizem   2. How are you currently taking this medication (dosage and times per day)? dc  3. Are you having a reaction (difficulty breathing--STAT)? Dizziness lightheaded unsteady gait   4. What is your medication issue?  Patient self dc cardizem 3 days ago and states symptoms have resolved

## 2019-12-27 NOTE — Telephone Encounter (Signed)
No answer/Voicemail box has not been set up yet.

## 2020-01-03 DIAGNOSIS — H2513 Age-related nuclear cataract, bilateral: Secondary | ICD-10-CM | POA: Diagnosis not present

## 2020-01-03 DIAGNOSIS — H353131 Nonexudative age-related macular degeneration, bilateral, early dry stage: Secondary | ICD-10-CM | POA: Diagnosis not present

## 2020-01-30 NOTE — Telephone Encounter (Signed)
Spoke to pt's dtr Gina Frazier (DPR) to follow up regarding Cardizem side effects. Last seen in office 07/31/19. Reports ~a month ago s/s of dizziness and drop in BP consistently 90s/60s. Pt stopped her Cardizem on her own and dizziness immediately resolved and BP returned to 120s/70s. Please advise of any recommendation.

## 2020-01-30 NOTE — Telephone Encounter (Signed)
As she has been off the Cardizem for 1 month would not recommend resumption. Goal for BP <130/80. Recommend she check her BP/HR at least a few times per week and keep a log.   When seen via telemedicine 09/21/19 by [name], PA she was recommended for 6 month follow up in person. Can we please get her an in-office visit with Dr. Rockey Situ or APP for January 2022?  Thanks! Loel Dubonnet, NP

## 2020-01-31 NOTE — Telephone Encounter (Signed)
Spoke to pt's dtr and notified of recc below. Dtr verbalized understanding of instructions. Will forward to scheduling to make sure pt gets set up for 6 month follow up in person with Dr. Rockey Situ or APP.

## 2020-01-31 NOTE — Telephone Encounter (Signed)
Patient called and unable to leave message. Needs 6 month fu in January 2022 with Rockey Situ

## 2020-02-05 ENCOUNTER — Other Ambulatory Visit: Payer: Self-pay | Admitting: Family

## 2020-02-05 NOTE — Telephone Encounter (Signed)
Prescription refill request for Eliquis received. Indication: Atrial Fibrillation Last office visit: 09/2019 [place] Scr: 0.8 07/2019 Age: 74 Weight:66.6 kg  Prescription refilled

## 2020-04-25 ENCOUNTER — Other Ambulatory Visit: Payer: Self-pay | Admitting: Internal Medicine

## 2020-04-25 DIAGNOSIS — G4700 Insomnia, unspecified: Secondary | ICD-10-CM

## 2020-05-03 ENCOUNTER — Other Ambulatory Visit: Payer: Self-pay | Admitting: Internal Medicine

## 2020-05-03 DIAGNOSIS — Z1231 Encounter for screening mammogram for malignant neoplasm of breast: Secondary | ICD-10-CM

## 2020-05-24 ENCOUNTER — Telehealth: Payer: Self-pay | Admitting: Internal Medicine

## 2020-05-24 NOTE — Telephone Encounter (Signed)
Please advise 

## 2020-05-24 NOTE — Telephone Encounter (Signed)
1. She needs to call 1 of below for therapy and psychiatry   [facility]  [place] [address] [phone]    [facility] [place]  [address]  [place] [address]  [phone]   2.  Why does she need GI? where does she want to go to see GI? A) [facility] she will not see the doctor unless she has a procedure?  B) [facility] in [place] ? C) [facility] in [place] or [place]? D) [facility] in [place] ? E)[facility] in [place]) [facility] in [place]

## 2020-05-24 NOTE — Telephone Encounter (Signed)
Patient called in need a referral to a psychiatrist and GI doctor

## 2020-05-28 NOTE — Telephone Encounter (Signed)
Referred [facility] urgently  She will have to call [facility] in [place] or [place]

## 2020-05-28 NOTE — Addendum Note (Signed)
Addended by: Orland Mustard on: 05/28/2020 05:55 PM   Modules accepted: Orders

## 2020-05-28 NOTE — Telephone Encounter (Signed)
Spoke with pt and gave her the information for Therapy and psychiatry. Pt stated that she would like to see GI because she has been having "explosive watery diarrhea" for about 1 month. Pt stated that the diarrhea occurs every time after eating and is accompanied by lower abdominal pain. The diarrhea and pain only occurs after eating.

## 2020-05-29 NOTE — Telephone Encounter (Signed)
Good morning!  Received.

## 2020-05-31 ENCOUNTER — Telehealth: Payer: Self-pay | Admitting: Internal Medicine

## 2020-05-31 NOTE — Telephone Encounter (Signed)
Patient called in about referral

## 2020-06-04 NOTE — Telephone Encounter (Signed)
Thank you :)

## 2020-06-07 ENCOUNTER — Other Ambulatory Visit: Payer: Self-pay | Admitting: Internal Medicine

## 2020-06-07 DIAGNOSIS — E785 Hyperlipidemia, unspecified: Secondary | ICD-10-CM

## 2020-06-13 ENCOUNTER — Telehealth: Payer: Self-pay | Admitting: Internal Medicine

## 2020-06-13 NOTE — Telephone Encounter (Signed)
Called to schedule Prolia injection patient is currently out of state at her mothers. Her mother is dying and she  Is taking care of her mother and may be out of town until August. Patient stated she will call the office when she returns and schedule Prolia injection.

## 2020-06-17 ENCOUNTER — Other Ambulatory Visit: Payer: Self-pay | Admitting: Cardiovascular Disease

## 2020-06-26 ENCOUNTER — Telehealth: Payer: Self-pay | Admitting: Cardiovascular Disease

## 2020-06-26 DIAGNOSIS — E785 Hyperlipidemia, unspecified: Secondary | ICD-10-CM

## 2020-06-26 MED ORDER — DILTIAZEM HCL ER COATED BEADS 120 MG PO CP24: 120.0000 mg | ORAL_CAPSULE | Freq: Every day | ORAL | 1 refills | Status: DC

## 2020-06-26 MED ORDER — FLECAINIDE ACETATE 50 MG PO TABS: 50.0000 mg | ORAL_TABLET | Freq: Two times a day (BID) | ORAL | 1 refills | Status: DC

## 2020-06-26 NOTE — Telephone Encounter (Signed)
Patients daughter calling in regarding patients upcoming appt in May. Patient is currently in [place] taking care of a sick family member and not expected to return until august.Patient is spanish speaking and does not feel comfortable doing a virtual visit.  Patient is going to need refills soon and is wanting to know if we can send them in until patient is able to return to  to see provider. Patients upcoming appt has been cancelled

## 2020-06-26 NOTE — Telephone Encounter (Signed)
Was able to return pt's daughter phone call, advised pt will not return from [place] until 10/21/2020 and would need some of her meds refilled until able to be seen. May appt was cancel. Advised could send in some refills on orders that are showing "0" refills, last seen was 09/2019.  Refills sent in on cardiac meds that were empty, others had refills remaining. Schedule pt for f/u 8/15 with Dr. Rockey Situ

## 2020-07-02 ENCOUNTER — Other Ambulatory Visit: Payer: Self-pay | Admitting: Internal Medicine

## 2020-07-02 ENCOUNTER — Other Ambulatory Visit: Payer: Self-pay | Admitting: Family

## 2020-07-02 ENCOUNTER — Other Ambulatory Visit: Payer: Self-pay | Admitting: Cardiovascular Disease

## 2020-07-02 ENCOUNTER — Telehealth: Payer: Self-pay

## 2020-07-02 DIAGNOSIS — G250 Essential tremor: Secondary | ICD-10-CM

## 2020-07-02 DIAGNOSIS — I10 Essential (primary) hypertension: Secondary | ICD-10-CM

## 2020-07-02 DIAGNOSIS — J302 Other seasonal allergic rhinitis: Secondary | ICD-10-CM

## 2020-07-02 DIAGNOSIS — I480 Paroxysmal atrial fibrillation: Secondary | ICD-10-CM

## 2020-07-02 DIAGNOSIS — I4891 Unspecified atrial fibrillation: Secondary | ICD-10-CM

## 2020-07-02 DIAGNOSIS — Z79899 Other long term (current) drug therapy: Secondary | ICD-10-CM

## 2020-07-02 NOTE — Telephone Encounter (Signed)
Was able to reach pt's daughter, Patricia (DPR approved), Murlene is in charge of pt's medication, pt is currently in [place], daughter is having to mail pt her medications as she still gets them filled here in [place]. Advised I daughter of change in medication due to Losartan not on pt's formality plan this 2022 year  Telmisartan 60mg  QHS. Please ask her to send [place] BP log in 2 weeks. Recommend BMP in 2-4 weeks for monitoring.   -Loel Dubonnet, NP   Daughter will try to get BP reading logs, meds will half to be mailed to pt, advised once pt starts meds to have labs drawn 2-4 weeks after taking Telmisartan, there is a [facility] near pt, advised will send to daughter to mail to pt. Daughter verbalized understanding, all questions answer, will call back with any further concerns.  Lab rec mailed to address on file, verify by daughter.

## 2020-07-02 NOTE — Telephone Encounter (Signed)
PA started through covermymeds   [place]: [id number] - Rx #: [id number] help? Call us at [phone] Status Sent to [place] information has been submitted to [place]. [employer] [place] will review the request and notify you of the determination decision directly, typically within 3 business days of your submission and once all necessary information is received.  You will also receive your request decision electronically. To check for an update later, open the request again from your dashboard.  If [employer] has not responded within the specified timeframe or if you have any questions about your PA submission, contact [place] directly at [facility]) [phone] or ([place]) [phone].

## 2020-07-02 NOTE — Telephone Encounter (Signed)
Please advise for alternative. See options when submitting request; Prompted via Epic  losartan (COZAAR) 50 MG tablet [Pharmacy Med Name: Losartan Potassium 50MG  TABS] is not on the preferred formulary for the patient's insurance plan. Below are alternatives which are likely to be more affordable. Do not assume that every medication presented is a clinically appropriate alternative.  These alternatives are medications that are in the same pharmaceutical subclass (Angiotensin II Receptor Antagonists) as the ordered medication and are on formulary for the patient's insurance plan.

## 2020-07-02 NOTE — Telephone Encounter (Signed)
Last OV 10/14/19 patient has appointment scheduled for June but is currently out of state taking care of mother due to terminal illness okay to fill medication?

## 2020-07-05 ENCOUNTER — Other Ambulatory Visit: Payer: Self-pay | Admitting: Cardiovascular Disease

## 2020-07-05 NOTE — Telephone Encounter (Signed)
Pt mentioned that she D/c Zetia due to diarrhea. Pt would like to restart Rosuvastatin 5 mg qd. Please advise pt requesting Rx for 90 day.

## 2020-07-05 NOTE — Telephone Encounter (Signed)
Pt would also discuss her diltiazem. Pt mentioned she d/c due to drop in BP and dizziness back in 12/2019. Pt just wants to make sure that it is ok to stay off medication. Please advise medication on current medlist.

## 2020-07-22 ENCOUNTER — Ambulatory Visit: Payer: Medicare Other | Admitting: Cardiovascular Disease

## 2020-07-29 ENCOUNTER — Telehealth (INDEPENDENT_AMBULATORY_CARE_PROVIDER_SITE_OTHER): Payer: Medicare Other | Admitting: Internal Medicine

## 2020-07-29 DIAGNOSIS — F4321 Adjustment disorder with depressed mood: Secondary | ICD-10-CM

## 2020-07-29 NOTE — Telephone Encounter (Signed)
Agreeable to my chart fee for this new medications we cant send in w/o consult Also rec. Pt consider grief counseling in [place] look for this up there  My condolences

## 2020-07-29 NOTE — Telephone Encounter (Signed)
Patient's mother passed away this morning. She needs something to calm her down. Patient is currently in [place]. Pharmacy info; [place] phone [phone].

## 2020-07-29 NOTE — Telephone Encounter (Signed)
Please advise 

## 2020-07-31 ENCOUNTER — Telehealth: Payer: Self-pay | Admitting: Internal Medicine

## 2020-07-31 NOTE — Telephone Encounter (Signed)
Left message to return call.  Mychart message also sent

## 2020-07-31 NOTE — Telephone Encounter (Signed)
Patient's daughter called in returning call about her mother

## 2020-07-31 NOTE — Telephone Encounter (Signed)
PT Daughter called in to return [place] call from earlier that was left with her in regards to PT.

## 2020-08-01 NOTE — Telephone Encounter (Signed)
PT daughter called in regards to the meds to keep her mom calm wanting more assistance and info about getting her some.

## 2020-08-01 NOTE — Telephone Encounter (Signed)
Patient's daughter informed and verbalized understanding.   They are agreeable to consultation fee. Pharmacy updated in patient chart

## 2020-08-01 NOTE — Telephone Encounter (Signed)
Patient returned referrals call. 

## 2020-08-05 ENCOUNTER — Encounter: Payer: Self-pay | Admitting: Internal Medicine

## 2020-08-05 ENCOUNTER — Other Ambulatory Visit: Payer: Self-pay | Admitting: Internal Medicine

## 2020-08-05 DIAGNOSIS — F419 Anxiety disorder, unspecified: Secondary | ICD-10-CM

## 2020-08-05 DIAGNOSIS — F4321 Adjustment disorder with depressed mood: Secondary | ICD-10-CM | POA: Insufficient documentation

## 2020-08-05 DIAGNOSIS — F4320 Adjustment disorder, unspecified: Secondary | ICD-10-CM | POA: Insufficient documentation

## 2020-08-05 DIAGNOSIS — G4700 Insomnia, unspecified: Secondary | ICD-10-CM

## 2020-08-05 MED ORDER — LORAZEPAM 0.5 MG PO TABS: 0.2500 mg | ORAL_TABLET | Freq: Every day | ORAL | 0 refills | Status: DC | PRN

## 2020-08-05 NOTE — Telephone Encounter (Signed)
Call daughter options would be locally here condolences again options for grief/mood   Consider [facility] in [place]  -referral needed  2. [facility] in [place] or [place] no referral needed  Or  3. [facility] in [place] -no referral needed   In [place]  Can try a [facility] near here they are 7 days a week she can call for appt with therapy and psychiatry if needed

## 2020-08-05 NOTE — Telephone Encounter (Signed)
Patient's daughter informed and verbalized understanding.   They are agreeable to consultation fee. Pharmacy updated in patient chart        Documentation     Gina Frazier routed conversation to Gina Frazier, CMA 4 days ago   Gina Frazier 4 days ago   Gina Frazier    PT daughter called in regards to the meds to keep her mom calm wanting more assistance and info about getting her some.      Documentation    Gina Frazier, Gina Frazier [phone]  Gina Frazier 4 days ago   Gina Frazier, CMA  Gina Frazier 5 days ago      Good morning,   Gina Frazier has received your request for a medication to help with being calm. She does offer her condolences. We have tried to call but do apologize that we missed you.   If you are agreeable to a telephone consultation fee Gina Frazier can send in a new medication for you. She also recommends grief counseling near you.   Once again we are sorry for your loss.    This MyChart message has not been read.   Gina Frazier, CMA 5 days ago       Left message to return call.  Mychart message also sent       Documentation     You  Gina Frazier, CMA 7 days ago   TM    Agreeable to my chart fee for this new medications we cant send in w/o consult Also rec. Pt consider grief counseling in [place] look for this up there  My condolences        Documentation     Gina Frazier, CMA  You 7 days ago       Please advise        Documentation    Gina Frazier, Gina Frazier routed conversation to Gina Frazier, CMA 7 days ago   Gina Frazier, Gina Frazier 7 days ago   [place]    Patient's mother passed away this morning. She needs something to calm her down. Patient is currently in [place]. Pharmacy info; [place] phone [phone].     Telephone call  1. Grief reaction with anxiety/insomnia, depressed mood  Cont ambien 5 mg qhs prn  Add ativan 0.25-0.5 mg qd prn  Likely  needs grief counseling and establish with therapy  Will need therapy and psychiatry in the future   Consider [facility] in [place] [facility] in [place]   Gina. Olivia Mackie Frazier Agreeable to fee  Time spent 5 min

## 2020-08-09 ENCOUNTER — Telehealth: Payer: Self-pay | Admitting: Internal Medicine

## 2020-08-09 NOTE — Telephone Encounter (Signed)
lft vm for daughter to call the ofc. thanks

## 2020-08-11 ENCOUNTER — Other Ambulatory Visit: Payer: Self-pay | Admitting: Nurse Practitioner

## 2020-08-11 ENCOUNTER — Telehealth: Payer: Self-pay | Admitting: Nurse Practitioner

## 2020-08-11 MED ORDER — ELIQUIS 5 MG PO TABS: 1.0000 | ORAL_TABLET | Freq: Two times a day (BID) | ORAL | 2 refills | Status: DC

## 2020-08-11 NOTE — Telephone Encounter (Signed)
   Pts dtr called to request refill on eliquis.  Pt is currently in [place] taking care of her own mother and will run out of eliquis in 5 days.  She was last seen via telemedicine visit in 09/2019 and is scheduled to f/u 10/28/2020.  Available data reviewed.  H/o nl renal fxn w/ last labs 07/23/2019.  H/H nl 07/22/2019.  Wt is 132 lbs per dtr (down from what is recorded in epic).  I have sent in Rx for eliquis 5mg  BID, #60, 2 refills to [facility] in [place], [place].  Pt returning to [facility] 10/22/2020.  Pt/dtr understand that office visit will be required for future refills.  [name], NP 08/11/2020, 1:45 PM

## 2020-08-14 ENCOUNTER — Encounter: Payer: Medicare Other | Admitting: Internal Medicine

## 2020-09-09 ENCOUNTER — Encounter: Payer: Self-pay | Admitting: Internal Medicine

## 2020-09-09 NOTE — Telephone Encounter (Signed)
Daughter informed via Deloris Ping that this medication was sent in to the requested pharmacy 04/25/20 with 5 refills. There should be refills on file.

## 2020-09-09 NOTE — Telephone Encounter (Signed)
Gina Frazier, Gina Frazier, Gina Glow, MD 1 hour ago (1:55 PM)      Hi Dr Linus Orn! I am writing you because my mom [name] date of birth 28-Nov-1945, cannot sleep. She has been complaining of this for weeks. You have given her Lorrin Mais in the past to help her. Is it possible to send to [facility] in [place]  a prescription for this medication for her? I would like her to sleep being that she is still worrying about my grandmother dying. Please help her. If there are any questions please call me at [phone] or my mom at [phone]. Thank you Marlow ocasio

## 2020-10-01 DIAGNOSIS — S79912A Unspecified injury of left hip, initial encounter: Secondary | ICD-10-CM | POA: Diagnosis not present

## 2020-10-01 DIAGNOSIS — W19XXXA Unspecified fall, initial encounter: Secondary | ICD-10-CM | POA: Diagnosis not present

## 2020-10-01 DIAGNOSIS — M25562 Pain in left knee: Secondary | ICD-10-CM | POA: Diagnosis not present

## 2020-10-01 DIAGNOSIS — I4891 Unspecified atrial fibrillation: Secondary | ICD-10-CM | POA: Diagnosis not present

## 2020-10-01 DIAGNOSIS — S0990XA Unspecified injury of head, initial encounter: Secondary | ICD-10-CM | POA: Diagnosis not present

## 2020-10-01 DIAGNOSIS — J069 Acute upper respiratory infection, unspecified: Secondary | ICD-10-CM | POA: Diagnosis not present

## 2020-10-01 DIAGNOSIS — Z7901 Long term (current) use of anticoagulants: Secondary | ICD-10-CM | POA: Diagnosis not present

## 2020-10-01 DIAGNOSIS — S199XXA Unspecified injury of neck, initial encounter: Secondary | ICD-10-CM | POA: Diagnosis not present

## 2020-10-01 DIAGNOSIS — R296 Repeated falls: Secondary | ICD-10-CM | POA: Diagnosis not present

## 2020-10-01 DIAGNOSIS — Z743 Need for continuous supervision: Secondary | ICD-10-CM | POA: Diagnosis not present

## 2020-10-01 DIAGNOSIS — Z20822 Contact with and (suspected) exposure to covid-19: Secondary | ICD-10-CM | POA: Diagnosis not present

## 2020-10-01 DIAGNOSIS — R519 Headache, unspecified: Secondary | ICD-10-CM | POA: Diagnosis not present

## 2020-10-01 DIAGNOSIS — Z043 Encounter for examination and observation following other accident: Secondary | ICD-10-CM | POA: Diagnosis not present

## 2020-10-01 DIAGNOSIS — M25569 Pain in unspecified knee: Secondary | ICD-10-CM | POA: Diagnosis not present

## 2020-10-14 ENCOUNTER — Ambulatory Visit: Payer: Medicare Other

## 2020-10-17 DIAGNOSIS — J029 Acute pharyngitis, unspecified: Secondary | ICD-10-CM | POA: Diagnosis not present

## 2020-10-24 ENCOUNTER — Other Ambulatory Visit: Payer: Self-pay | Admitting: Internal Medicine

## 2020-10-24 DIAGNOSIS — G4700 Insomnia, unspecified: Secondary | ICD-10-CM

## 2020-10-25 ENCOUNTER — Telehealth: Payer: Self-pay

## 2020-10-25 NOTE — Telephone Encounter (Signed)
Pt's daughter called requesting refill on zolpidem (AMBIEN) 5 MG tablet. Pt has CPE in October but has canceled last two appts. She states that she was in [place] taking care of family and that is why she was unable to make appts

## 2020-10-27 NOTE — Progress Notes (Signed)
Date:  10/28/2020   ID:  Gina Frazier, DOB 05/29/1945, MRN [id number]  Patient Location:  [place] [address]   Provider location:   [facility], [place] office  PCP:  McLean-Scocuzza, Nino Glow, MD  Cardiologist:  [name], MD   Chief Complaint  Patient presents with   12 month follow up     Patient c/o weight loss and diarrhea since her mother's passing in May, 2022. Medications reviewed by the patient's daughter Kurdi).    History of Present Illness:    Gina Frazier is a 75 y.o. female with PMH of  Hypertension Evaluated in 2012 for acute hypertension in the dentist chair,  hospital with atrial fibrillation on 04/28/2014, started on Cardizem, Lopressor converting to normal sinus rhythm (Prior episode 6 months prior) started on anticoagulation with eliquis 5 mg by mouth twice a day,   flecainide 50 mg twice a day, continued on beta blocker. 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 mild to moderate diffuse aortic atherosclerosis descending aorta    Prior  CV studies:   The following studies were reviewed today:  Echocardiogram from the hospital shows ejection fraction 60-65%, otherwise normal study    Past Medical History:  Diagnosis Date   Allergy    Anxiety    Bell's palsy    left   Chronic back pain    Family history of adverse reaction to anesthesia    pts daughter had severe vomiting 2014    Hip joint pain    left   Hypertension    Osteoarthritis    PAF (paroxysmal atrial fibrillation) ([facility])    a. initially diagnosed 04/2014; b. on Eliquis; c. CHADS2VASc = 3   Palpitations    Wears contact lenses    Wears dentures    partial upper   Past Surgical History:  Procedure Laterality Date   CHOLECYSTECTOMY     COLONOSCOPY WITH PROPOFOL N/A 11/05/2014   Procedure: COLONOSCOPY WITH PROPOFOL;  Surgeon: Lucilla Lame, MD;  Location: [facility];  Service: Endoscopy;  Laterality: N/A;   JOINT REPLACEMENT     left knee x 2 04/2024 and 01/2017 Dr. 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   albuterol (VENTOLIN HFA) 108 (90 Base) MCG/ACT inhaler inhale 2 puffs by mouth and INTO THE LUNGS every 4 hours if needed for wheezing   apixaban (ELIQUIS) 5 MG TABS tablet Take 1 tablet (5 mg total) by mouth 2 (two) times daily.   Calcium Carbonate-Vitamin D  600-400 MG-UNIT tablet Take by mouth.   Cholecalciferol (VITAMIN D) 2000 units tablet Take 2,000 Units by mouth daily.   diltiazem (CARDIZEM CD) 120 MG 24 hr capsule Take 1 capsule (120 mg total) by mouth daily.   donepezil (ARICEPT) 5 MG tablet Take 5 mg by mouth at bedtime.   ezetimibe (ZETIA) 10 MG tablet TAKE 1 TABLET (10 MG TOTAL) BY MOUTH DAILY.   flecainide (TAMBOCOR) 50 MG tablet Take 1 tablet (50 mg total) by mouth 2 (two) times daily. PLEASE KEEP SCHEDULED APPOINTMENT FOR MAY.   ipratropium (ATROVENT) 0.06 % nasal spray PLACE 2 SPRAYS INTO BOTH NOSTRILS 3 (THREE) TIMES DAILY.   levocetirizine (XYZAL) 5 MG tablet Take 0.5-1 tablets (2.5-5 mg total) by mouth at bedtime as needed for allergies.   LORazepam (ATIVAN) 0.5 MG tablet Take 0.5-1 tablets (0.25-0.5 mg total) by mouth daily as needed for anxiety or sleep.   methocarbamol (ROBAXIN) 500 MG tablet Take 500 mg by mouth 2 (two) times daily.   Olopatadine HCl 0.2 % SOLN Apply 1 drop to eye daily. Both   propranolol ER (INDERAL LA) 60 MG 24 hr capsule TAKE 1 CAPSULE (60 MG TOTAL) BY MOUTH DAILY.   sodium chloride (OCEAN) 0.65 % SOLN nasal spray Place 1-2 sprays into both nostrils daily as needed for congestion.   telmisartan (MICARDIS) 40 MG tablet Take 60 mg (1 1/2/ pills) nightly   tiZANidine (ZANAFLEX) 2 MG tablet Take 1-2 tablets (2-4 mg total) by mouth at bedtime as needed for muscle spasms.   topiramate (TOPAMAX) 50 MG tablet Take 100 mg by mouth 2 (two) times daily.   zolpidem (AMBIEN) 5 MG tablet TAKE 1 TABLET (5 MG TOTAL) BY MOUTH AT BEDTIME AS NEEDED FOR SLEEP.     Allergies:   Pravastatin and Fish allergy   Social History   Tobacco Use   Smoking status: Never   Smokeless tobacco: Never  Vaping Use   Vaping Use: Never used  Substance Use Topics   Alcohol use: No   Drug use: No     Family Hx: The patient's family history includes Arthritis in her daughter; Bladder Cancer in her sister; Breast cancer in an other  family member; Breast cancer (age of onset: 66) in her sister; Cancer in an other family member; Depression in her daughter, sister, and another family member; Diabetes in her mother and sister; GER disease in her daughter; Heart disease in her brother, father, mother, and another family member; Hypertension in her brother, mother, and sister; Hypothyroidism in her daughter; Lupus in her daughter; Mental illness in her mother.  ROS:   Please see the history of present illness.    Review of Systems  Constitutional:  Positive for weight loss.  Respiratory: Negative.    Cardiovascular: Negative.   Gastrointestinal: Negative.   Musculoskeletal: Negative.   Neurological: Negative.   Psychiatric/Behavioral:  The  patient is nervous/anxious.   All other systems reviewed and are negative.   Labs/Other Tests and Data Reviewed:    Recent Labs: No results found for requested labs within last 8760 hours.   Recent Lipid Panel Lab Results  Component Value Date/Time   CHOL 191 06/29/2019 11:46 AM   TRIG 72.0 06/29/2019 11:46 AM   HDL 72.40 06/29/2019 11:46 AM   CHOLHDL 3 06/29/2019 11:46 AM   LDLCALC 105 (H) 06/29/2019 11:46 AM    Wt Readings from Last 3 Encounters:  10/28/20 130 lb 9.6 oz (59.2 kg)  10/19/19 146 lb 12.8 oz (66.6 kg)  10/13/19 146 lb 3.2 oz (66.3 kg)     Exam:    Vital Signs:  BP 136/62 (BP Location: Left Arm, Patient Position: Sitting, Cuff Size: Normal)   Pulse (!) 53   Ht '5\' 4"'$  (1.626 m)   Wt 130 lb 9.6 oz (59.2 kg)   SpO2 98%   BMI 22.42 kg/m   Constitutional:  oriented to person, place, and time. No distress. thin HENT:  Head: Grossly normal Eyes:  no discharge. No scleral icterus.  Neck: No JVD, no carotid bruits  Cardiovascular: Regular rate and rhythm, no murmurs appreciated Pulmonary/Chest: Clear to auscultation bilaterally, no wheezes or rails Abdominal: Soft.  no distension.  no tenderness.  Musculoskeletal: Normal range of motion Neurological:  normal  muscle tone. Coordination normal. No atrophy Skin: Skin warm and dry Psychiatric: normal affect, pleasant   ASSESSMENT & PLAN:    PAF (paroxysmal atrial fibrillation) ([facility]) No changes to her medications, no recurrent arrhythmia  Weight loss, unintentional taking care of her mother missing meals, not taking care of herself Weight down 28 pounds, now with leg weakness Discussed ways to stabilize her weight, rebuild her leg strength  Essential hypertension Blood pressure well controlled at home, no changes to medications  Chest pain, unspecified type No chest pain, no further work-up at this time  Anxiety  recommend regular walking program  Aortic atherosclerosis Weight down 28 pounds On zetia Not on crestor,    Total encounter time more than 25 minutes  Greater than 50% was spent in counseling and coordination of care with the patient    Signed, [name], MD  10/28/2020 3:08 PM    [facility] [id number], [place], [place] [address]

## 2020-10-28 ENCOUNTER — Ambulatory Visit: Payer: Medicare Other | Admitting: Cardiovascular Disease

## 2020-10-28 ENCOUNTER — Other Ambulatory Visit: Payer: Self-pay

## 2020-10-28 ENCOUNTER — Encounter: Payer: Self-pay | Admitting: Cardiovascular Disease

## 2020-10-28 VITALS — BP 136/62 | HR 53 | Ht 64.0 in | Wt 130.6 lb

## 2020-10-28 DIAGNOSIS — I480 Paroxysmal atrial fibrillation: Secondary | ICD-10-CM | POA: Diagnosis not present

## 2020-10-28 DIAGNOSIS — I700 Atherosclerosis of aorta: Secondary | ICD-10-CM

## 2020-10-28 DIAGNOSIS — G250 Essential tremor: Secondary | ICD-10-CM

## 2020-10-28 DIAGNOSIS — E785 Hyperlipidemia, unspecified: Secondary | ICD-10-CM

## 2020-10-28 DIAGNOSIS — R079 Chest pain, unspecified: Secondary | ICD-10-CM

## 2020-10-28 DIAGNOSIS — I4891 Unspecified atrial fibrillation: Secondary | ICD-10-CM

## 2020-10-28 DIAGNOSIS — I10 Essential (primary) hypertension: Secondary | ICD-10-CM

## 2020-10-28 MED ORDER — TELMISARTAN 40 MG PO TABS: ORAL_TABLET | ORAL | 3 refills | Status: DC

## 2020-10-28 MED ORDER — PROPRANOLOL HCL ER 60 MG PO CP24: 60.0000 mg | ORAL_CAPSULE | Freq: Every day | ORAL | 3 refills | Status: DC

## 2020-10-28 MED ORDER — EZETIMIBE 10 MG PO TABS: 10.0000 mg | ORAL_TABLET | Freq: Every day | ORAL | 3 refills | Status: DC

## 2020-10-28 MED ORDER — APIXABAN 5 MG PO TABS: 5.0000 mg | ORAL_TABLET | Freq: Two times a day (BID) | ORAL | 11 refills | Status: DC

## 2020-10-28 MED ORDER — DILTIAZEM HCL ER COATED BEADS 120 MG PO CP24: 120.0000 mg | ORAL_CAPSULE | Freq: Every day | ORAL | 3 refills | Status: DC

## 2020-10-28 NOTE — Telephone Encounter (Signed)
Medication was refilled by PCP 10/24/20

## 2020-10-28 NOTE — Patient Instructions (Signed)
Medication Instructions:   Please continue your current  cardiac medications  *If you need a refill on your cardiac medications before your next appointment, please call your pharmacy*   Lab Work: None needed at this time  Testing/Procedures: None needed at this time.    Follow-Up: At [facility] you and your health needs are our priority.  As part of our continuing mission to provide you with exceptional heart care, we have created designated Provider Care Teams.  These Care Teams include your primary Cardiologist (physician) and Advanced Practice Providers (APPs -  Physician Assistants and Nurse Practitioners) who all work together to provide you with the care you need, when you need it.   Your next appointment:   1 year(s)  The format for your next appointment:   In Person  Provider:   You may see [name], MD or one of the following Advanced Practice Providers on your designated Care Team:   [name], NP [name], PA-C [name], PA-C [name], [place]

## 2020-11-04 ENCOUNTER — Other Ambulatory Visit: Payer: Self-pay | Admitting: Internal Medicine

## 2020-11-04 ENCOUNTER — Other Ambulatory Visit: Payer: Self-pay

## 2020-11-04 DIAGNOSIS — I4891 Unspecified atrial fibrillation: Secondary | ICD-10-CM

## 2020-11-04 DIAGNOSIS — I480 Paroxysmal atrial fibrillation: Secondary | ICD-10-CM

## 2020-11-04 DIAGNOSIS — I10 Essential (primary) hypertension: Secondary | ICD-10-CM

## 2020-11-04 DIAGNOSIS — G4700 Insomnia, unspecified: Secondary | ICD-10-CM

## 2020-11-04 DIAGNOSIS — J302 Other seasonal allergic rhinitis: Secondary | ICD-10-CM

## 2020-11-04 DIAGNOSIS — G250 Essential tremor: Secondary | ICD-10-CM

## 2020-11-04 NOTE — Telephone Encounter (Signed)
PT daughter called to advise that she needs the following meds called in for refill at the [facility] on file:  ezetimibe (ZETIA) 10 MG tablet ipratropium (ATROVENT) 0.06 % nasal spray zolpidem (AMBIEN) 5 MG tablet

## 2020-11-04 NOTE — Telephone Encounter (Signed)
*  STAT* If patient is at the pharmacy, call can be transferred to refill team.   1. Which medications need to be refilled? (please list name of each medication and dose if known) Micardis, Propranolol, Flecainide, Eliquis  2. Which pharmacy/location (including street and city if local pharmacy) is medication to be sent to? [place]  3. Do they need a 30 day or 90 day supply? [place]

## 2020-11-05 MED ORDER — APIXABAN 5 MG PO TABS: 5.0000 mg | ORAL_TABLET | Freq: Two times a day (BID) | ORAL | 1 refills | Status: DC

## 2020-11-05 MED ORDER — PROPRANOLOL HCL ER 60 MG PO CP24
60.0000 mg | ORAL_CAPSULE | Freq: Every day | ORAL | 3 refills | Status: DC
Start: 2020-11-05 — End: 2021-10-16

## 2020-11-05 MED ORDER — TELMISARTAN 40 MG PO TABS: ORAL_TABLET | ORAL | 3 refills | Status: DC

## 2020-11-05 NOTE — Telephone Encounter (Signed)
Prescription refill request for Eliquis received. Indication:afib Last office visit:gollan 10/28/20 Scr:0.9 10/01/20 Age: 42fWeight:59.2kg

## 2020-11-05 NOTE — Telephone Encounter (Signed)
Left a message to call back.

## 2020-11-08 NOTE — Telephone Encounter (Signed)
Left a message to call back.

## 2020-11-11 ENCOUNTER — Telehealth: Payer: Self-pay | Admitting: Cardiovascular Disease

## 2020-11-11 MED ORDER — APIXABAN 5 MG PO TABS: 5.0000 mg | ORAL_TABLET | Freq: Two times a day (BID) | ORAL | 1 refills | Status: DC

## 2020-11-11 NOTE — Telephone Encounter (Signed)
*  STAT* If patient is at the pharmacy, call can be transferred to refill team.   1. Which medications need to be refilled? (please list name of each medication and dose if known) Eliquis '5mg'$  1 tablet twice a day  2. Which pharmacy/location (including street and city if local pharmacy) is medication to be sent to? [facility], [place]  3. Do they need a 30 day or 90 day supply? 30 day

## 2020-11-11 NOTE — Telephone Encounter (Signed)
Eliquis 5 mg refill request received. Patient is 75 years old, weight-59.2 kg, Crea- 0.9 on 10/01/20 via Care Everywhere, Diagnosis-PAF, and last seen by Dr. Rockey Situ on 10/28/20. Dose is appropriate based on dosing criteria. Will send in refill to requested pharmacy.

## 2020-11-12 ENCOUNTER — Telehealth: Payer: Self-pay | Admitting: Cardiovascular Disease

## 2020-11-12 DIAGNOSIS — G250 Essential tremor: Secondary | ICD-10-CM | POA: Diagnosis not present

## 2020-11-12 DIAGNOSIS — R413 Other amnesia: Secondary | ICD-10-CM | POA: Diagnosis not present

## 2020-11-12 DIAGNOSIS — M62830 Muscle spasm of back: Secondary | ICD-10-CM | POA: Diagnosis not present

## 2020-11-12 NOTE — Telephone Encounter (Signed)
Patient calling the office for samples of medication:   1.  What medication and dosage are you requesting samples for? Gina Frazier 5 MG 1 tablet 2 times daily   2.  Are you currently out of this medication? Took last one day

## 2020-11-12 NOTE — Telephone Encounter (Signed)
Patient checking on status.

## 2020-11-12 NOTE — Telephone Encounter (Addendum)
Left a message to call back. Sent a message through [place] for the patient to call back.Marland Kitchen

## 2020-11-12 NOTE — Telephone Encounter (Signed)
*  STAT* If patient is at the pharmacy, call can be transferred to refill team.   1. Which medications need to be refilled? (please list name of each medication and dose if known) Tambocor 50 mg   2. Which pharmacy/location (including street and city if local pharmacy) is medication to be sent to? [facility], [place]  3. Do they need a 30 day or 90 day supply? 90 day

## 2020-11-12 NOTE — Telephone Encounter (Signed)
Patient came in to check on refill request from yesterday since she took her last pill today. After reviewing the chart, prescription for Eliquis did not go through as it was set to "No Print". I called prescription directly to pharmacy and informed patient she could go to the pharmacy to pick up today. If any other issues, please give the office a return call.

## 2020-11-12 NOTE — Telephone Encounter (Signed)
Lmom for the pt to call the pharmacy as we have sent the rx

## 2020-11-12 NOTE — Telephone Encounter (Signed)
This was taken care of already, please refer to previous encounter for further details.

## 2020-11-13 ENCOUNTER — Telehealth: Payer: Self-pay | Admitting: Internal Medicine

## 2020-11-13 NOTE — Telephone Encounter (Signed)
Gina Frazier returned the call concerning her medication. Pt stated that she needed her Ambien and Atrovent sent to [facility] in [facility] on [address]. I informed the patient that the medication was already sent to [facility] in [place]. Pt states that she does not want it to go to this pharmacy and requests the [facility] in [place]. Pt states that she will contact Dr. Rockey Situ to receive a refill of Zetia.  Pt has an appointment scheduled with Dr. Olivia Mackie on 01/10/21 Ambien last refilled on 10/24/20 Atrovent last refilled on 07/02/20 with 12 refills.

## 2020-11-13 NOTE — Telephone Encounter (Signed)
PA started for on [employer] for Prolia.

## 2020-11-14 MED ORDER — IPRATROPIUM BROMIDE 0.06 % NA SOLN: 2.0000 | Freq: Three times a day (TID) | NASAL | 12 refills | Status: DC

## 2020-11-14 MED ORDER — ZOLPIDEM TARTRATE 5 MG PO TABS: 5.0000 mg | ORAL_TABLET | Freq: Every evening | ORAL | 5 refills | Status: DC | PRN

## 2020-11-14 NOTE — Addendum Note (Signed)
Addended by: Ezequiel Ganser on: 11/14/2020 11:28 AM   Modules accepted: Orders

## 2020-11-14 NOTE — Telephone Encounter (Signed)
Atorvent has been sent to the pharmacy specified. Ambien is a controlled medication and I can not refill. It has been pended for provider signature.

## 2020-11-21 DIAGNOSIS — K921 Melena: Secondary | ICD-10-CM | POA: Diagnosis not present

## 2020-11-21 DIAGNOSIS — E1143 Type 2 diabetes mellitus with diabetic autonomic (poly)neuropathy: Secondary | ICD-10-CM | POA: Diagnosis not present

## 2020-11-21 DIAGNOSIS — E0865 Diabetes mellitus due to underlying condition with hyperglycemia: Secondary | ICD-10-CM | POA: Diagnosis not present

## 2020-11-21 DIAGNOSIS — R197 Diarrhea, unspecified: Secondary | ICD-10-CM | POA: Diagnosis not present

## 2020-11-21 DIAGNOSIS — Z6823 Body mass index (BMI) 23.0-23.9, adult: Secondary | ICD-10-CM | POA: Diagnosis not present

## 2020-11-27 DIAGNOSIS — E1143 Type 2 diabetes mellitus with diabetic autonomic (poly)neuropathy: Secondary | ICD-10-CM | POA: Diagnosis not present

## 2020-11-27 DIAGNOSIS — R197 Diarrhea, unspecified: Secondary | ICD-10-CM | POA: Diagnosis not present

## 2020-11-27 DIAGNOSIS — K921 Melena: Secondary | ICD-10-CM | POA: Diagnosis not present

## 2020-12-04 DIAGNOSIS — H0231 Blepharochalasis right upper eyelid: Secondary | ICD-10-CM | POA: Diagnosis not present

## 2020-12-04 DIAGNOSIS — H2513 Age-related nuclear cataract, bilateral: Secondary | ICD-10-CM | POA: Diagnosis not present

## 2020-12-04 DIAGNOSIS — H04123 Dry eye syndrome of bilateral lacrimal glands: Secondary | ICD-10-CM | POA: Diagnosis not present

## 2020-12-04 DIAGNOSIS — H353131 Nonexudative age-related macular degeneration, bilateral, early dry stage: Secondary | ICD-10-CM | POA: Diagnosis not present

## 2020-12-05 ENCOUNTER — Other Ambulatory Visit: Payer: Self-pay

## 2020-12-05 ENCOUNTER — Telehealth: Payer: Self-pay | Admitting: Internal Medicine

## 2020-12-05 ENCOUNTER — Ambulatory Visit (INDEPENDENT_AMBULATORY_CARE_PROVIDER_SITE_OTHER): Payer: Medicare Other

## 2020-12-05 DIAGNOSIS — Z23 Encounter for immunization: Secondary | ICD-10-CM | POA: Diagnosis not present

## 2020-12-05 NOTE — Telephone Encounter (Signed)
I left pt a vm to call ofc to follow up with me regarding if pt wanted to resch appt with [facility] gi. thanks

## 2020-12-11 ENCOUNTER — Telehealth: Payer: Self-pay | Admitting: Cardiovascular Disease

## 2020-12-11 MED ORDER — FLECAINIDE ACETATE 50 MG PO TABS: 50.0000 mg | ORAL_TABLET | Freq: Two times a day (BID) | ORAL | 3 refills | Status: DC

## 2020-12-11 NOTE — Telephone Encounter (Signed)
*  STAT* If patient is at the pharmacy, call can be transferred to refill team.   1. Which medications need to be refilled? (please list name of each medication and dose if known) Tambocor 1 tablet twice a day  2. Which pharmacy/location (including street and city if local pharmacy) is medication to be sent to? [place]  3. Do they need a 30 day or 90 day supply? 90 day

## 2020-12-11 NOTE — Telephone Encounter (Signed)
Requested Prescriptions   Signed Prescriptions Disp Refills   flecainide (TAMBOCOR) 50 MG tablet 180 tablet 3    Sig: Take 1 tablet (50 mg total) by mouth 2 (two) times daily.    Authorizing Provider: Minna Merritts    Ordering User: Britt Bottom

## 2021-01-10 ENCOUNTER — Ambulatory Visit (INDEPENDENT_AMBULATORY_CARE_PROVIDER_SITE_OTHER): Payer: Medicare Other | Admitting: Internal Medicine

## 2021-01-10 ENCOUNTER — Encounter: Payer: Self-pay | Admitting: Internal Medicine

## 2021-01-10 ENCOUNTER — Other Ambulatory Visit: Payer: Self-pay

## 2021-01-10 VITALS — BP 130/78 | HR 54 | Temp 97.6°F | Ht 60.91 in | Wt 131.2 lb

## 2021-01-10 DIAGNOSIS — R1310 Dysphagia, unspecified: Secondary | ICD-10-CM

## 2021-01-10 DIAGNOSIS — G4700 Insomnia, unspecified: Secondary | ICD-10-CM

## 2021-01-10 DIAGNOSIS — E785 Hyperlipidemia, unspecified: Secondary | ICD-10-CM

## 2021-01-10 DIAGNOSIS — F02A4 Dementia in other diseases classified elsewhere, mild, with anxiety: Secondary | ICD-10-CM

## 2021-01-10 DIAGNOSIS — H269 Unspecified cataract: Secondary | ICD-10-CM

## 2021-01-10 DIAGNOSIS — M810 Age-related osteoporosis without current pathological fracture: Secondary | ICD-10-CM

## 2021-01-10 DIAGNOSIS — R059 Cough, unspecified: Secondary | ICD-10-CM

## 2021-01-10 DIAGNOSIS — R634 Abnormal weight loss: Secondary | ICD-10-CM | POA: Diagnosis not present

## 2021-01-10 DIAGNOSIS — G309 Alzheimer's disease, unspecified: Secondary | ICD-10-CM

## 2021-01-10 DIAGNOSIS — R053 Chronic cough: Secondary | ICD-10-CM

## 2021-01-10 DIAGNOSIS — F419 Anxiety disorder, unspecified: Secondary | ICD-10-CM

## 2021-01-10 MED ORDER — BENZONATATE 200 MG PO CAPS: 200.0000 mg | ORAL_CAPSULE | Freq: Three times a day (TID) | ORAL | 2 refills | Status: DC | PRN

## 2021-01-10 MED ORDER — ALBUTEROL SULFATE HFA 108 (90 BASE) MCG/ACT IN AERS: 1.0000 | INHALATION_SPRAY | Freq: Four times a day (QID) | RESPIRATORY_TRACT | 12 refills | Status: DC | PRN

## 2021-01-10 MED ORDER — ZOLPIDEM TARTRATE 5 MG PO TABS: 5.0000 mg | ORAL_TABLET | Freq: Every evening | ORAL | 5 refills | Status: DC | PRN

## 2021-01-10 MED ORDER — EZETIMIBE 10 MG PO TABS: 10.0000 mg | ORAL_TABLET | Freq: Every day | ORAL | 3 refills | Status: DC

## 2021-01-10 NOTE — Patient Instructions (Addendum)
Consider colonoscopy and upper endoscopy   Call for appt they have therapy and psychiatry Call for appointment no referral needed   [facility] counseling and psychiatry [facility]  [place] [address] [phone]    [facility]  [address]  [place] [address]  [phone]  Theraworx spray for cramps for your daughter

## 2021-01-11 LAB — COMPREHENSIVE METABOLIC PANEL
AG Ratio: 1.7 (calc) (ref 1.0–2.5)
ALT: 9 U/L (ref 6–29)
AST: 18 U/L (ref 10–35)
Albumin: 4.2 g/dL (ref 3.6–5.1)
Alkaline phosphatase (APISO): 82 U/L (ref 37–153)
BUN: 25 mg/dL (ref 7–25)
CO2: 25 mmol/L (ref 20–32)
Calcium: 9.2 mg/dL (ref 8.6–10.4)
Chloride: 107 mmol/L (ref 98–110)
Creat: 0.97 mg/dL (ref 0.60–1.00)
Globulin: 2.5 g/dL (calc) (ref 1.9–3.7)
Glucose, Bld: 77 mg/dL (ref 65–99)
Potassium: 3.7 mmol/L (ref 3.5–5.3)
Sodium: 141 mmol/L (ref 135–146)
Total Bilirubin: 0.6 mg/dL (ref 0.2–1.2)
Total Protein: 6.7 g/dL (ref 6.1–8.1)

## 2021-01-11 LAB — LIPID PANEL
Cholesterol: 199 mg/dL (ref <200)
HDL: 79 mg/dL (ref >=50)
LDL Cholesterol (Calc): 106 mg/dL (calc) — ABNORMAL HIGH
Non-HDL Cholesterol (Calc): 120 mg/dL (calc) (ref <130)
Total CHOL/HDL Ratio: 2.5 (calc) (ref <5.0)
Triglycerides: 54 mg/dL (ref <150)

## 2021-01-13 ENCOUNTER — Encounter: Payer: Self-pay | Admitting: Internal Medicine

## 2021-01-13 DIAGNOSIS — H269 Unspecified cataract: Secondary | ICD-10-CM | POA: Insufficient documentation

## 2021-01-13 NOTE — Progress Notes (Addendum)
Chief Complaint  Patient presents with   Annual Exam   Medication Refill   Annual per pt with multiple complaints with daughter 1. Wt loss no missed meals seeing [facility] sch colonoscopy pending from 168 to 131 lbs since Apr 07, 2020 to November 05, 2020 mom her mom died in 2020/08/05 and she was under significant stress ans she has had diarrhea and is down about 30 lbs stool tests, thyroid function tests, cbc, cmet have been negative with GI. She does c/o cough starting in her throat with h/o asthma as a kid 2. Hld been out of zetia 10 needs refill  3. Dementia, Chronic insomnia, anxiety due to grandaughters boyfriend stressor given # [facility] to call and sch appt for psychiatry and thearpy. They are moving to get away from him as he lived with daughter, granddaughter and herself  Daughter asking for h/h services to help with medications and home needs with memory loss   Review of Systems  Constitutional:  Positive for weight loss.  HENT:  Negative for hearing loss.   Eyes:  Negative for blurred vision.  Respiratory:  Positive for cough. Negative for shortness of breath.   Cardiovascular:  Negative for chest pain.  Gastrointestinal:  Positive for diarrhea.  Musculoskeletal:  Positive for falls. Negative for joint pain.  Skin:  Negative for rash.  Neurological:  Negative for headaches.  Psychiatric/Behavioral:  Negative for memory loss.   Past Medical History:  Diagnosis Date   Allergy    Anxiety    Bell's palsy    left   Chronic back pain    Family history of adverse reaction to anesthesia    pts daughter had severe vomiting 2014    Hip joint pain    left   Hypertension    Osteoarthritis    PAF (paroxysmal atrial fibrillation) ([place])    a. initially diagnosed 04/2014; b. on Eliquis; c. CHADS2VASc = 3   Palpitations    Wears contact lenses    Wears dentures    partial upper   Past Surgical History:  Procedure Laterality Date   CHOLECYSTECTOMY     COLONOSCOPY WITH PROPOFOL N/A 11/05/2014    Procedure: COLONOSCOPY WITH PROPOFOL;  Surgeon: Lucilla Lame, MD;  Location: [place];  Service: Endoscopy;  Laterality: N/A;   JOINT REPLACEMENT     left knee x 2 04/2024 and 01/2017 Dr. 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   Procedure: TOTAL KNEE ARTHROPLASTY;  Surgeon: Hessie Knows, MD;  Location: [facility];  Service: Orthopedics;  Laterality: Left;   TOTAL KNEE REVISION Left 02/11/2017   Procedure: TOTAL KNEE REVISION, POLYETHELENE EXCHANGE;  Surgeon: Hessie Knows, MD;  Location: [facility];  Service: Orthopedics;  Laterality: Left;   Family History  Problem Relation Age of Onset   Heart disease Mother    Hypertension Mother    Mental illness Mother    Diabetes Mother    Heart disease Father    Hypertension Sister    Depression Sister    Diabetes Sister    Breast cancer Sister 28   Bladder Cancer Sister    Heart disease Brother    Hypertension Brother    Arthritis Daughter    GER disease Daughter    Hypothyroidism Daughter    Lupus Daughter  Depression Daughter    Cancer Other        colon cancer, breast cancer    Heart disease Other    Breast cancer Other    Depression Other    Social History   Socioeconomic History   Marital status: Divorced    Spouse name: Not on file   Number of children: 2   Years of education: Not on file   Highest education level: Not on file  Occupational History   Not on file  Tobacco Use   Smoking status: Never   Smokeless tobacco: Never  Vaping Use   Vaping Use: Never used  Substance and Sexual Activity   Alcohol use: No   Drug use: No   Sexual activity: Never  Other Topics Concern   Not on file  Social History Narrative   2 daughters 1 lives [place] the other [place] pt visits both     Lives with daughter in [place] and grand daughter       Social Determinants of Health   Financial Resource Strain: Not on file  Food Insecurity: Not on file  Transportation Needs: Not on file  Physical Activity: Not on file  Stress: Not on file  Social Connections: Not on file  Intimate Partner Violence: Not on file   Current Meds  Medication Sig   acetaminophen (TYLENOL) 500 MG tablet Take 1,000 mg every 8 (eight) hours as needed by mouth for mild pain.   apixaban (ELIQUIS) 5 MG TABS tablet Take 1 tablet (5 mg total) by mouth 2 (two) times daily.   benzonatate (TESSALON) 200 MG capsule Take 1 capsule (200 mg total) by mouth 3 (three) times daily as needed for cough.   Calcium Carbonate-Vitamin D 600-400 MG-UNIT tablet Take by mouth.   Cholecalciferol (VITAMIN D) 2000 units tablet Take 2,000 Units by mouth daily.   diltiazem (CARDIZEM CD) 120 MG 24 hr capsule Take 1 capsule (120 mg total) by mouth daily.   donepezil (ARICEPT) 5 MG tablet Take 5 mg by mouth at bedtime.   flecainide (TAMBOCOR) 50 MG tablet Take 1 tablet (50 mg total) by mouth 2 (two) times daily.   ipratropium (ATROVENT) 0.06 % nasal spray Place 2 sprays into both nostrils 3 (three) times daily.   levocetirizine (XYZAL) 5 MG tablet Take 0.5-1 tablets (2.5-5 mg total) by mouth at bedtime as needed for allergies.   LORazepam (ATIVAN) 0.5 MG tablet Take 0.5-1 tablets (0.25-0.5 mg total) by mouth daily as needed for anxiety or sleep.   methocarbamol (ROBAXIN) 500 MG tablet Take 500 mg by mouth 2 (two) times daily.   propranolol ER (INDERAL LA) 60 MG 24 hr capsule Take 1 capsule (60 mg total) by mouth daily.   sodium chloride (OCEAN) 0.65 % SOLN nasal spray Place 1-2 sprays into both nostrils daily as needed for congestion.   telmisartan (MICARDIS) 40 MG tablet Take 60 mg (1 1/2/ pills) nightly   tiZANidine (ZANAFLEX) 2 MG tablet Take 1-2 tablets (2-4 mg total) by mouth at bedtime as needed for muscle spasms.    topiramate (TOPAMAX) 50 MG tablet Take 100 mg by mouth 2 (two) times daily.   [DISCONTINUED] albuterol (VENTOLIN HFA) 108 (90 Base) MCG/ACT inhaler inhale 2 puffs by mouth and INTO THE LUNGS every 4 hours if needed for wheezing   [DISCONTINUED] ezetimibe (ZETIA) 10 MG tablet Take 1 tablet (10 mg total) by mouth daily.   [DISCONTINUED] zolpidem (AMBIEN) 5 MG tablet Take 1 tablet (5 mg total) by  mouth at bedtime as needed for sleep.   Allergies  Allergen Reactions   Pravastatin     Diarrhea/constipation per pt     Fish Allergy Nausea And Vomiting, Swelling and Rash   Recent Results (from the past 2160 hour(s))  Comprehensive metabolic panel     Status: None   Collection Time: 01/10/21  2:30 PM  Result Value Ref Range   Glucose, Bld 77 65 - 99 mg/dL    Comment: .            Fasting reference interval .    BUN 25 7 - 25 mg/dL   Creat 0.97 0.60 - 1.00 mg/dL   BUN/Creatinine Ratio NOT APPLICABLE 6 - 22 (calc)   Sodium 141 135 - 146 mmol/L   Potassium 3.7 3.5 - 5.3 mmol/L   Chloride 107 98 - 110 mmol/L   CO2 25 20 - 32 mmol/L   Calcium 9.2 8.6 - 10.4 mg/dL   Total Protein 6.7 6.1 - 8.1 g/dL   Albumin 4.2 3.6 - 5.1 g/dL   Globulin 2.5 1.9 - 3.7 g/dL (calc)   AG Ratio 1.7 1.0 - 2.5 (calc)   Total Bilirubin 0.6 0.2 - 1.2 mg/dL   Alkaline phosphatase (APISO) 82 37 - 153 U/L   AST 18 10 - 35 U/L   ALT 9 6 - 29 U/L  Lipid panel     Status: Abnormal   Collection Time: 01/10/21  2:30 PM  Result Value Ref Range   Cholesterol 199 <200 mg/dL   HDL 79 > OR = 50 mg/dL   Triglycerides 54 <150 mg/dL   LDL Cholesterol (Calc) 106 (H) mg/dL (calc)    Comment: Reference range: <100 . Desirable range <100 mg/dL for primary prevention;   <70 mg/dL for patients with CHD or diabetic patients  with > or = 2 CHD risk factors. Marland Kitchen LDL-C is now calculated using the Martin-Hopkins  calculation, which is a validated novel method providing  better accuracy than the Friedewald equation in the   estimation of LDL-C.  Cresenciano Genre et al. [name]. 3662;947(65): 2061-2068  (http://education.[employer].com/faq/FAQ164)    Total CHOL/HDL Ratio 2.5 <5.0 (calc)   Non-HDL Cholesterol (Calc) 120 <130 mg/dL (calc)    Comment: For patients with diabetes plus 1 major ASCVD risk  factor, treating to a non-HDL-C goal of <100 mg/dL  (LDL-C of <70 mg/dL) is considered a therapeutic  option.    Objective  Body mass index is 24.87 kg/m. Wt Readings from Last 3 Encounters:  01/10/21 131 lb 3.2 oz (59.5 kg)  10/28/20 130 lb 9.6 oz (59.2 kg)  10/19/19 146 lb 12.8 oz (66.6 kg)   Temp Readings from Last 3 Encounters:  01/10/21 97.6 F (36.4 C) (Temporal)  10/13/19 98.6 F (37 C) (Oral)  07/27/19 (!) 97.3 F (36.3 C) (Temporal)   BP Readings from Last 3 Encounters:  01/10/21 130/78  10/28/20 136/62  10/19/19 (!) 150/69   Pulse Readings from Last 3 Encounters:  01/10/21 (!) 54  10/28/20 (!) 53  10/19/19 60    Physical Exam Vitals and nursing note reviewed.  Constitutional:      Appearance: Normal appearance. She is well-developed and well-groomed.  HENT:     Head: Normocephalic and atraumatic.  Eyes:     Conjunctiva/sclera: Conjunctivae normal.     Pupils: Pupils are equal, round, and reactive to light.  Cardiovascular:     Rate and Rhythm: Normal rate and regular rhythm.     Heart sounds: Normal heart  sounds. No murmur heard. Pulmonary:     Effort: Pulmonary effort is normal.     Breath sounds: Normal breath sounds.  Chest:     Comments: Cough on exam   Skin:    General: Skin is warm and dry.  Neurological:     General: No focal deficit present.     Mental Status: She is alert and oriented to person, place, and time. Mental status is at baseline.     Gait: Gait normal.  Psychiatric:        Attention and Perception: Attention and perception normal.        Mood and Affect: Mood and affect normal.        Speech: Speech normal.        Behavior: Behavior normal.  Behavior is cooperative.        Thought Content: Thought content normal.        Cognition and Memory: Cognition and memory normal.        Judgment: Judgment normal.    Assessment  Plan  Weight loss r/o malignancy with diarrhea, cough wt loss 30 lbs- Plan: CT Chest Wo Contrast persistent cough, CT ABDOMEN PELVIS WO CONTRAST, wt loss of 30 lbs Comprehensive metabolic panel Pending colonoscopy [facility] will see if they will do egd as well    Hyperlipidemia, unspecified hyperlipidemia type - Plan: ezetimibe (ZETIA) 10 MG tablet, Lipid panel F/u cards  Insomnia, anxiety/stress/dementia- Plan: zolpidem (AMBIEN) 5 MG tablet Given # [facility] to call and sch with psych and therapy Dementia, Chronic insomnia, anxiety due to grandaughters boyfriend stressor given # [facility] to call and sch appt for psychiatry and thearpy. They are moving to get away from him as he lived with daughter, granddaughter and herself  Daughter asking for h/h services to help with medications and home needs with memory loss   Cough, unspecified type - Plan: albuterol (VENTOLIN HFA) 108 (90 Base) MCG/ACT inhaler, CT Chest Wo Contrast, CT SOFT TISSUE NECK WO CONTRAST for dysphagia , benzonatate (TESSALON) 200 MG capsule  Persistent cough - Plan: CT Chest Wo Contrast, CT SOFT TISSUE NECK WO CONTRAST, benzonatate (TESSALON) 200 MG capsule bid to tid prn  Prn albuterol inhaler h/o asthma younger age   Dysphagia, unspecified type - Plan: CT SOFT TISSUE NECK WO CONTRAST iv contrast causes diarrhea will avoid all contrast   HM Labs today had cmet, cbc, tfts stool studies all neg [facility] 11/21/20 and 11/27/20  Had flu shot utd  had, pna vx x 2 consider repeat pna 23 when it has been 5 years 11/09/2019 but new guidelines not needed with age >32   zoster consider shingrix in future given Rx prev has not yet had   -prevnar 01/28/17, pna 23 11/09/14 confirmed, Tdap 01/06/13  covid vx had 2/2    Pap no h/o  abnormal no GU blood has not had in a while though typically stop age 33.  Colonoscopy last 11/05/14 diverticulosis/hemorrhoids repeat in 10 years also consider cologuard  -est [facility] as of 01/10/21 pending colonoscopy mammo normal 04/25/19 -as of 01/10/21 order in needs to call and sch mammogram and dexa   dexa osteoporosis 07/2017 on prolia had shot recently 04/28/18 and 11/01/18 f/u q6 months -will repeat dEXA after 04/2019 Never smoker  Skin- as of 01/10/21 Provider: Dr. Olivia Mackie McLean-Scocuzza-[facility]

## 2021-01-15 DIAGNOSIS — F4323 Adjustment disorder with mixed anxiety and depressed mood: Secondary | ICD-10-CM | POA: Diagnosis not present

## 2021-01-20 NOTE — Progress Notes (Signed)
Faxed via epic routing with note on cover

## 2021-01-23 ENCOUNTER — Other Ambulatory Visit: Payer: Medicare Other

## 2021-01-29 ENCOUNTER — Other Ambulatory Visit: Payer: Self-pay

## 2021-01-29 ENCOUNTER — Ambulatory Visit
Admission: RE | Admit: 2021-01-29 | Discharge: 2021-01-29 | Disposition: A | Discharge status: Home / Self Care | Payer: Medicare Other | Source: Ambulatory Visit | Attending: Internal Medicine | Admitting: Internal Medicine

## 2021-01-29 DIAGNOSIS — N852 Hypertrophy of uterus: Secondary | ICD-10-CM | POA: Diagnosis not present

## 2021-01-29 DIAGNOSIS — R059 Cough, unspecified: Secondary | ICD-10-CM | POA: Insufficient documentation

## 2021-01-29 DIAGNOSIS — R634 Abnormal weight loss: Secondary | ICD-10-CM | POA: Diagnosis not present

## 2021-01-29 DIAGNOSIS — K5900 Constipation, unspecified: Secondary | ICD-10-CM | POA: Diagnosis not present

## 2021-01-29 DIAGNOSIS — R053 Chronic cough: Secondary | ICD-10-CM | POA: Diagnosis not present

## 2021-01-29 DIAGNOSIS — R918 Other nonspecific abnormal finding of lung field: Secondary | ICD-10-CM | POA: Diagnosis not present

## 2021-01-29 DIAGNOSIS — R1310 Dysphagia, unspecified: Secondary | ICD-10-CM | POA: Insufficient documentation

## 2021-01-29 DIAGNOSIS — K5790 Diverticulosis of intestine, part unspecified, without perforation or abscess without bleeding: Secondary | ICD-10-CM | POA: Diagnosis not present

## 2021-01-29 DIAGNOSIS — I700 Atherosclerosis of aorta: Secondary | ICD-10-CM | POA: Diagnosis not present

## 2021-02-02 ENCOUNTER — Other Ambulatory Visit: Payer: Self-pay | Admitting: Obstetrics and Gynecology

## 2021-02-02 DIAGNOSIS — N852 Hypertrophy of uterus: Secondary | ICD-10-CM

## 2021-02-02 DIAGNOSIS — D259 Leiomyoma of uterus, unspecified: Secondary | ICD-10-CM

## 2021-02-02 DIAGNOSIS — R1030 Lower abdominal pain, unspecified: Secondary | ICD-10-CM

## 2021-02-02 NOTE — Progress Notes (Unsigned)
Please have patient scheduled for ultrasound (per recommendations from recent CT scan and PCP). Will review results with patient via Mychart or phone.

## 2021-02-02 NOTE — Progress Notes (Signed)
I have reviewed. Essentially unchanged since last imaging last year with ultrasound however since the ovaries were not seen on recent CT, can follow up with ultrasound to assess.

## 2021-02-03 NOTE — Progress Notes (Signed)
Rubie Maid, MD  McLean-Scocuzza, Nino Glow, MD I have reviewed. Essentially unchanged since last imaging last year with ultrasound however since the ovaries were not seen on recent CT, can follow up with ultrasound to assess.

## 2021-02-05 ENCOUNTER — Other Ambulatory Visit: Payer: Medicare Other

## 2021-02-11 NOTE — Telephone Encounter (Signed)
Ok to restart prolia if approved kathy please schedule

## 2021-02-11 NOTE — Telephone Encounter (Signed)
Patient  was out of town when last Prolia due and we restart? Last Calcium 9.2.

## 2021-02-19 ENCOUNTER — Other Ambulatory Visit: Payer: Self-pay | Admitting: Obstetrics and Gynecology

## 2021-02-19 ENCOUNTER — Other Ambulatory Visit: Payer: Self-pay

## 2021-02-19 ENCOUNTER — Ambulatory Visit (INDEPENDENT_AMBULATORY_CARE_PROVIDER_SITE_OTHER): Payer: Medicare Other

## 2021-02-19 DIAGNOSIS — D259 Leiomyoma of uterus, unspecified: Secondary | ICD-10-CM

## 2021-02-19 DIAGNOSIS — R1030 Lower abdominal pain, unspecified: Secondary | ICD-10-CM | POA: Diagnosis not present

## 2021-02-19 DIAGNOSIS — N852 Hypertrophy of uterus: Secondary | ICD-10-CM

## 2021-02-20 ENCOUNTER — Telehealth: Payer: Self-pay | Admitting: Internal Medicine

## 2021-02-20 NOTE — Telephone Encounter (Signed)
Pt will need a current face to face or video visit for the home health referral. Please advise and Thank you!

## 2021-02-20 NOTE — Telephone Encounter (Signed)
Noted, Patient was seen 01/10/21.

## 2021-02-20 NOTE — Addendum Note (Signed)
Addended by: Orland Mustard on: 02/20/2021 12:59 PM   Modules accepted: Orders

## 2021-02-20 NOTE — Telephone Encounter (Signed)
Please disregard message. Its 90 days.

## 2021-02-20 NOTE — Addendum Note (Signed)
Addended by: Orland Mustard on: 02/20/2021 09:45 AM   Modules accepted: Orders

## 2021-02-22 DIAGNOSIS — R1310 Dysphagia, unspecified: Secondary | ICD-10-CM | POA: Diagnosis not present

## 2021-02-22 DIAGNOSIS — F0284 Dementia in other diseases classified elsewhere, unspecified severity, with anxiety: Secondary | ICD-10-CM | POA: Diagnosis not present

## 2021-02-22 DIAGNOSIS — I480 Paroxysmal atrial fibrillation: Secondary | ICD-10-CM | POA: Diagnosis not present

## 2021-02-22 DIAGNOSIS — Z7901 Long term (current) use of anticoagulants: Secondary | ICD-10-CM | POA: Diagnosis not present

## 2021-02-22 DIAGNOSIS — G20 Parkinson's disease: Secondary | ICD-10-CM | POA: Diagnosis not present

## 2021-02-22 DIAGNOSIS — G309 Alzheimer's disease, unspecified: Secondary | ICD-10-CM | POA: Diagnosis not present

## 2021-02-22 DIAGNOSIS — R634 Abnormal weight loss: Secondary | ICD-10-CM | POA: Diagnosis not present

## 2021-02-22 DIAGNOSIS — I10 Essential (primary) hypertension: Secondary | ICD-10-CM | POA: Diagnosis not present

## 2021-02-22 DIAGNOSIS — G510 Bell's palsy: Secondary | ICD-10-CM | POA: Diagnosis not present

## 2021-02-22 DIAGNOSIS — M479 Spondylosis, unspecified: Secondary | ICD-10-CM | POA: Diagnosis not present

## 2021-02-22 DIAGNOSIS — M1612 Unilateral primary osteoarthritis, left hip: Secondary | ICD-10-CM | POA: Diagnosis not present

## 2021-02-22 DIAGNOSIS — Z6827 Body mass index (BMI) 27.0-27.9, adult: Secondary | ICD-10-CM | POA: Diagnosis not present

## 2021-02-22 DIAGNOSIS — G8929 Other chronic pain: Secondary | ICD-10-CM | POA: Diagnosis not present

## 2021-02-22 DIAGNOSIS — E785 Hyperlipidemia, unspecified: Secondary | ICD-10-CM | POA: Diagnosis not present

## 2021-02-22 DIAGNOSIS — M810 Age-related osteoporosis without current pathological fracture: Secondary | ICD-10-CM | POA: Diagnosis not present

## 2021-02-22 DIAGNOSIS — F5104 Psychophysiologic insomnia: Secondary | ICD-10-CM | POA: Diagnosis not present

## 2021-02-24 ENCOUNTER — Telehealth: Payer: Self-pay | Admitting: Internal Medicine

## 2021-02-24 NOTE — Telephone Encounter (Signed)
Left message to call office

## 2021-02-24 NOTE — Telephone Encounter (Signed)
Mirica from [facility] called in regards to pt and pts daughter. Pt was admitted into home health on 12/10. Pt is having a Level 2 medication interaction with propranolol and her albuterol inhaler. Pt has been ordered nursing, PT, OT, speech therapist, and a Education officer, museum.

## 2021-02-26 NOTE — Telephone Encounter (Signed)
Noted do not have the fax yet for her or her daughter

## 2021-02-26 NOTE — Telephone Encounter (Signed)
Colletta Maryland from [facility] called stating the patients care will be on Delay. The OT will not be able to go out until tomorrow , Speech will go out Friday. The social worker will not be able to come due to a Covid diagnosis may be 2 weeks before she will be able to come.

## 2021-02-26 NOTE — Telephone Encounter (Signed)
Awaiting fax that will require a signature on the message from 2 days ago.   For your information on treatment delay

## 2021-02-27 NOTE — Telephone Encounter (Signed)
Ok for verbal orders ?

## 2021-02-27 NOTE — Telephone Encounter (Signed)
Mackie from [facility] called in regards to verbal orders for OT needed for pt and daughter.   1 week 3 1q 2 week 2    Ardelle Lesches can be reached at [phone] and the line is secure to leave a message

## 2021-02-27 NOTE — Telephone Encounter (Signed)
Okay for verbal orders. 

## 2021-03-03 NOTE — Telephone Encounter (Signed)
Left detailed voicemail with okay for verbal orders

## 2021-03-06 ENCOUNTER — Encounter: Payer: Self-pay | Admitting: Internal Medicine

## 2021-03-14 ENCOUNTER — Telehealth: Payer: Self-pay | Admitting: Internal Medicine

## 2021-03-14 NOTE — Telephone Encounter (Signed)
She can take micardis 40 mg 2x per day increase from 60 mg daily  Monitor BP and let me know BP readings in 1-2 weeks  Reduce salt intake

## 2021-03-14 NOTE — Telephone Encounter (Signed)
Gina Frazier from [facility] called.  Please call daughter if any questions, Irs [phone]. Patient's BP is running 165/85. [name] stated that this morning BP was normal. Patient does not have any other symptoms.

## 2021-03-14 NOTE — Telephone Encounter (Signed)
Per daughter says mother BP was just high mother had not been active she had been resting on coach  165/85 pulse 70 no other symptoms at all.

## 2021-03-18 NOTE — Telephone Encounter (Signed)
Patient notified and voiced understanding.

## 2021-03-25 ENCOUNTER — Telehealth: Payer: Self-pay | Admitting: Internal Medicine

## 2021-03-25 DIAGNOSIS — F4323 Adjustment disorder with mixed anxiety and depressed mood: Secondary | ICD-10-CM | POA: Diagnosis not present

## 2021-03-25 NOTE — Telephone Encounter (Signed)
The 01/10/22 note needs to be addended. Alvis Lemmings will need to have the reason for the patient being seen by home health. This needs to be documented into the assessment. The focus is on Dementia . This will help them to be compliant with Medicare.

## 2021-03-25 NOTE — Telephone Encounter (Signed)
Have you gotten this letter?   Did not see this in your box or the color folder upfront.

## 2021-03-25 NOTE — Telephone Encounter (Signed)
Mariann Laster from [facility] is looking for orders for patient. She needs them signed and faxed asap, patient is currently out of medicare compliance.

## 2021-03-25 NOTE — Telephone Encounter (Signed)
Pts daughter dropped off letter with information regarding her mothers appt at [facility] on 01/16. Pt is needing a referral placed before 01/16. Letter placed in PCPs box

## 2021-03-25 NOTE — Telephone Encounter (Signed)
I dont see this in my box in the back

## 2021-03-26 ENCOUNTER — Telehealth: Payer: Self-pay | Admitting: Internal Medicine

## 2021-03-26 DIAGNOSIS — M479 Spondylosis, unspecified: Secondary | ICD-10-CM | POA: Diagnosis not present

## 2021-03-26 DIAGNOSIS — R634 Abnormal weight loss: Secondary | ICD-10-CM | POA: Diagnosis not present

## 2021-03-26 DIAGNOSIS — M810 Age-related osteoporosis without current pathological fracture: Secondary | ICD-10-CM | POA: Diagnosis not present

## 2021-03-26 DIAGNOSIS — G309 Alzheimer's disease, unspecified: Secondary | ICD-10-CM | POA: Diagnosis not present

## 2021-03-26 DIAGNOSIS — M1612 Unilateral primary osteoarthritis, left hip: Secondary | ICD-10-CM | POA: Diagnosis not present

## 2021-03-26 DIAGNOSIS — I10 Essential (primary) hypertension: Secondary | ICD-10-CM | POA: Diagnosis not present

## 2021-03-26 DIAGNOSIS — Z7901 Long term (current) use of anticoagulants: Secondary | ICD-10-CM | POA: Diagnosis not present

## 2021-03-26 DIAGNOSIS — F02A4 Dementia in other diseases classified elsewhere, mild, with anxiety: Secondary | ICD-10-CM | POA: Insufficient documentation

## 2021-03-26 DIAGNOSIS — R1310 Dysphagia, unspecified: Secondary | ICD-10-CM | POA: Diagnosis not present

## 2021-03-26 DIAGNOSIS — F0284 Dementia in other diseases classified elsewhere, unspecified severity, with anxiety: Secondary | ICD-10-CM | POA: Diagnosis not present

## 2021-03-26 DIAGNOSIS — G510 Bell's palsy: Secondary | ICD-10-CM | POA: Diagnosis not present

## 2021-03-26 DIAGNOSIS — Z6827 Body mass index (BMI) 27.0-27.9, adult: Secondary | ICD-10-CM | POA: Diagnosis not present

## 2021-03-26 DIAGNOSIS — G20 Parkinson's disease: Secondary | ICD-10-CM | POA: Diagnosis not present

## 2021-03-26 DIAGNOSIS — G8929 Other chronic pain: Secondary | ICD-10-CM | POA: Diagnosis not present

## 2021-03-26 DIAGNOSIS — E785 Hyperlipidemia, unspecified: Secondary | ICD-10-CM | POA: Diagnosis not present

## 2021-03-26 DIAGNOSIS — I480 Paroxysmal atrial fibrillation: Secondary | ICD-10-CM | POA: Diagnosis not present

## 2021-03-26 DIAGNOSIS — F5104 Psychophysiologic insomnia: Secondary | ICD-10-CM | POA: Diagnosis not present

## 2021-03-26 NOTE — Telephone Encounter (Signed)
Please resend referral [facility] from 02/20/21 pt has appt with them 03/31/21 at 10 am and needs this referral  No longer wants to see [facility]

## 2021-03-26 NOTE — Telephone Encounter (Signed)
I am not sure what is needed from me all of the information is in my note and the order  What does [facility] need from me?

## 2021-03-26 NOTE — Telephone Encounter (Signed)
Please advise, do you know where the note was placed?

## 2021-03-27 NOTE — Telephone Encounter (Signed)
States they are needing dementia added to the 01/10/21 note. Gina Frazier saying this is not in her note.   States this needs to be mentioned in the written assessment portion of the office note.

## 2021-03-27 NOTE — Telephone Encounter (Signed)
Pls refax note if needed dementia added to note was on problem list for the day 01/10/21

## 2021-03-31 ENCOUNTER — Telehealth: Payer: Self-pay | Admitting: Cardiovascular Disease

## 2021-03-31 DIAGNOSIS — R197 Diarrhea, unspecified: Secondary | ICD-10-CM | POA: Diagnosis not present

## 2021-03-31 DIAGNOSIS — R634 Abnormal weight loss: Secondary | ICD-10-CM | POA: Diagnosis not present

## 2021-03-31 DIAGNOSIS — K219 Gastro-esophageal reflux disease without esophagitis: Secondary | ICD-10-CM | POA: Diagnosis not present

## 2021-03-31 DIAGNOSIS — R1310 Dysphagia, unspecified: Secondary | ICD-10-CM | POA: Diagnosis not present

## 2021-03-31 NOTE — Telephone Encounter (Signed)
° °  Pre-operative Risk Assessment    Patient Name: Gina Frazier  DOB: Apr 02, 1945 MRN: [id number]      Request for Surgical Clearance    Procedure:   Colon/EGD  Date of Surgery:  Clearance 06/05/21                                 Surgeon:  Not listed Surgeon's Group or Practice Name:  [facility]  Phone number:  [phone] Fax number:  [phone]   Type of Clearance Requested:   - Pharmacy:  Hold Apixaban (Eliquis) instructions   Type of Anesthesia:  Not Indicated   Additional requests/questions:    Manfred Arch   03/31/2021, 2:18 PM

## 2021-03-31 NOTE — Telephone Encounter (Signed)
Good afternoon,   Do I need to fax this note to [facility]? I have the fax number for their signed orders department, is this the same as referral?

## 2021-03-31 NOTE — Telephone Encounter (Signed)
Faxed note via epic routing to referrals number [phone]

## 2021-04-01 NOTE — Telephone Encounter (Signed)
Patient with diagnosis of atrial fibrillation on Eliquis for anticoagulation.    Procedure: colon/EGD Date of procedure: 06/05/21   CHA2DS2-VASc Score = 4   This indicates a 4.8% annual risk of stroke. The patient's score is based upon: CHF History: 0 HTN History: 1 Diabetes History: 0 Stroke History: 0 Vascular Disease History: 0 Age Score: 2 Gender Score: 1    CrCl 37 Platelet count 204  Per office protocol, patient can hold Eliquis for 2 days prior to procedure.   Patient will not need bridging with Lovenox (enoxaparin) around procedure.

## 2021-04-03 NOTE — Telephone Encounter (Signed)
Left a message for the patient to call back and speak to the on-call preop APP of the day 

## 2021-04-04 NOTE — Telephone Encounter (Signed)
Attempted to contact patient as part of preoperative protocol.  Patient needs call back.

## 2021-04-07 NOTE — Telephone Encounter (Signed)
Left message to call back on 04/07/2021 at 1638.  Patient is call back.

## 2021-04-10 ENCOUNTER — Telehealth: Payer: Self-pay | Admitting: Cardiovascular Disease

## 2021-04-10 NOTE — Telephone Encounter (Signed)
Left voice mail to call back 

## 2021-04-10 NOTE — Telephone Encounter (Signed)
I will forward call to pre op provider, as pre op provider left message for call back.

## 2021-04-10 NOTE — Telephone Encounter (Signed)
Pt returning a call to preop please advise

## 2021-04-11 ENCOUNTER — Telehealth: Payer: Self-pay | Admitting: Cardiovascular Disease

## 2021-04-11 NOTE — Telephone Encounter (Signed)
° ° °  Patient Name: Gina Frazier  DOB: 02-Sep-1945 MRN: [id number]  Primary Cardiologist: [name], MD  Finally able to get in touch with the patient.  Given past medical history and time since last visit, based on ACC/AHA guidelines, [name] would be at acceptable risk for the planned procedure without further cardiovascular testing.   The patient was advised that if she develops new symptoms prior to surgery to contact our office to arrange for a follow-up visit, and she verbalized understanding.  I will route this recommendation to the requesting party via Epic fax function and remove from pre-op pool.  Please call with questions.  [place], [place] 04/11/2021, 11:08 AM

## 2021-04-11 NOTE — Telephone Encounter (Signed)
Patient returned call

## 2021-04-11 NOTE — Telephone Encounter (Signed)
I will forward today's message to pre op provider today. Yesterday's message was as well sent to pre op provider that pt called back. Looks like pre op provider has tried to reach pt in regard to clearance request.

## 2021-04-15 ENCOUNTER — Encounter: Payer: Self-pay | Admitting: Internal Medicine

## 2021-04-15 ENCOUNTER — Ambulatory Visit: Payer: Medicare Other | Admitting: Internal Medicine

## 2021-04-15 ENCOUNTER — Ambulatory Visit (INDEPENDENT_AMBULATORY_CARE_PROVIDER_SITE_OTHER): Payer: Medicare Other | Admitting: Internal Medicine

## 2021-04-15 ENCOUNTER — Other Ambulatory Visit: Payer: Self-pay

## 2021-04-15 VITALS — BP 124/86 | HR 70 | Temp 98.3°F | Ht 60.91 in | Wt 134.0 lb

## 2021-04-15 DIAGNOSIS — R9389 Abnormal findings on diagnostic imaging of other specified body structures: Secondary | ICD-10-CM

## 2021-04-15 DIAGNOSIS — J984 Other disorders of lung: Secondary | ICD-10-CM

## 2021-04-15 DIAGNOSIS — R0602 Shortness of breath: Secondary | ICD-10-CM | POA: Diagnosis not present

## 2021-04-15 DIAGNOSIS — R918 Other nonspecific abnormal finding of lung field: Secondary | ICD-10-CM

## 2021-04-15 DIAGNOSIS — H9193 Unspecified hearing loss, bilateral: Secondary | ICD-10-CM

## 2021-04-15 DIAGNOSIS — M4306 Spondylolysis, lumbar region: Secondary | ICD-10-CM

## 2021-04-15 DIAGNOSIS — J4520 Mild intermittent asthma, uncomplicated: Secondary | ICD-10-CM

## 2021-04-15 DIAGNOSIS — R269 Unspecified abnormalities of gait and mobility: Secondary | ICD-10-CM

## 2021-04-15 DIAGNOSIS — M810 Age-related osteoporosis without current pathological fracture: Secondary | ICD-10-CM

## 2021-04-15 DIAGNOSIS — F03A3 Unspecified dementia, mild, with mood disturbance: Secondary | ICD-10-CM

## 2021-04-15 MED ORDER — ALBUTEROL SULFATE (2.5 MG/3ML) 0.083% IN NEBU: 2.5000 mg | INHALATION_SOLUTION | Freq: Four times a day (QID) | RESPIRATORY_TRACT | 11 refills | Status: DC | PRN

## 2021-04-15 NOTE — Progress Notes (Signed)
Chief Complaint  Patient presents with   Follow-up   F/u with daughter  1. C/o sob worsening esp with exertion and prone to falls wants rx rollator for home and outings h/o and abnormal ct chest 01/2021 rec repeat in 6 months scarring and lung nodules  Wants referral pulm  Using grandaughters neb h/o asthma as a kid  Pt has ? About propranolol and asthma will defer use to pulmonary cardiology has rx propranolol   2. H/o hearing loss worsening watching tv load wants referral to ent hearing check  3. Memory worse wants referral to [facility]    Review of Systems  Constitutional:  Negative for weight loss.  HENT:  Negative for hearing loss.   Eyes:  Negative for blurred vision.  Respiratory:  Negative for shortness of breath.   Cardiovascular:  Negative for chest pain.  Gastrointestinal:  Negative for abdominal pain and blood in stool.  Genitourinary:  Negative for dysuria.  Musculoskeletal:  Negative for falls and joint pain.  Skin:  Negative for rash.  Neurological:  Negative for headaches.  Psychiatric/Behavioral:  Negative for depression.   Past Medical History:  Diagnosis Date   Allergy    Anxiety    Asthma    Bell's palsy    left   Chronic back pain    Family history of adverse reaction to anesthesia    pts daughter had severe vomiting 2014    Hip joint pain    left   Hypertension    Osteoarthritis    PAF (paroxysmal atrial fibrillation) ([facility])    a. initially diagnosed 04/2014; b. on Eliquis; c. CHADS2VASc = 3   Palpitations    Wears contact lenses    Wears dentures    partial upper   Past Surgical History:  Procedure Laterality Date   CHOLECYSTECTOMY     COLONOSCOPY WITH PROPOFOL N/A 11/05/2014   Procedure: COLONOSCOPY WITH PROPOFOL;  Surgeon: Lucilla Lame, MD;  Location: [facility];  Service: Endoscopy;  Laterality: N/A;   JOINT REPLACEMENT     left knee x 2 04/2024 and 01/2017 Dr. Rudene Christians    KNEE ARTHROSCOPY Left 06/28/2014   Procedure:  ARTHROSCOPY KNEE WITH PARTIALLATERAL MENISECTOMY AND DEBRIDEMENT;  Surgeon: Susa Day, MD;  [facility];  Service: Orthopedics;  Laterality: Left;   TOOTH EXTRACTION     TOTAL KNEE ARTHROPLASTY Left 05/02/2015   Procedure: TOTAL KNEE ARTHROPLASTY;  Surgeon: Hessie Knows, MD;  Location: [facility];  Service: Orthopedics;  Laterality: Left;   TOTAL KNEE REVISION Left 02/11/2017   Procedure: TOTAL KNEE REVISION, POLYETHELENE EXCHANGE;  Surgeon: Hessie Knows, MD;  Location: [facility];  Service: Orthopedics;  Laterality: Left;   Family History  Problem Relation Age of Onset   Heart disease Mother    Hypertension Mother    Mental illness Mother    Diabetes Mother    Heart disease Father    Hypertension Sister    Depression Sister    Diabetes Sister    Breast cancer Sister 39   Bladder Cancer Sister    Heart disease Brother    Hypertension Brother    Arthritis Daughter    GER disease Daughter    Hypothyroidism Daughter    Lupus Daughter    Depression Daughter    Cancer Other        colon cancer, breast cancer    Heart disease Other    Breast cancer Other    Depression Other    Social History  Socioeconomic History   Marital status: Divorced    Spouse name: Not on file   Number of children: 2   Years of education: Not on file   Highest education level: Not on file  Occupational History   Not on file  Tobacco Use   Smoking status: Never   Smokeless tobacco: Never  Vaping Use   Vaping Use: Never used  Substance and Sexual Activity   Alcohol use: No   Drug use: No   Sexual activity: Never  Other Topics Concern   Not on file  Social History Narrative   2 daughters 1 lives [place] the other [place] pt visits both    Lives with daughter in [place] and grand daughter       Social Determinants of Health   Financial Resource Strain: Not on file  Food Insecurity: Not on file  Transportation Needs: Not on file  Physical Activity: Not on file  Stress: Not on  file  Social Connections: Not on file  Intimate Partner Violence: Not on file   Current Meds  Medication Sig   acetaminophen (TYLENOL) 500 MG tablet Take 1,000 mg every 8 (eight) hours as needed by mouth for mild pain.   albuterol (PROVENTIL) (2.5 MG/3ML) 0.083% nebulizer solution Take 3 mLs (2.5 mg total) by nebulization every 6 (six) hours as needed for wheezing or shortness of breath.   albuterol (VENTOLIN HFA) 108 (90 Base) MCG/ACT inhaler Inhale 1-2 puffs into the lungs every 6 (six) hours as needed for wheezing or shortness of breath.   apixaban (ELIQUIS) 5 MG TABS tablet Take 1 tablet (5 mg total) by mouth 2 (two) times daily.   benzonatate (TESSALON) 200 MG capsule Take 1 capsule (200 mg total) by mouth 3 (three) times daily as needed for cough.   Calcium Carbonate-Vitamin D 600-400 MG-UNIT tablet Take by mouth.   Cholecalciferol (VITAMIN D) 2000 units tablet Take 2,000 Units by mouth daily.   donepezil (ARICEPT) 5 MG tablet Take 5 mg by mouth at bedtime.   ezetimibe (ZETIA) 10 MG tablet Take 1 tablet (10 mg total) by mouth daily.   flecainide (TAMBOCOR) 50 MG tablet Take 1 tablet (50 mg total) by mouth 2 (two) times daily.   ipratropium (ATROVENT) 0.06 % nasal spray Place 2 sprays into both nostrils 3 (three) times daily.   levocetirizine (XYZAL) 5 MG tablet Take 0.5-1 tablets (2.5-5 mg total) by mouth at bedtime as needed for allergies.   LORazepam (ATIVAN) 0.5 MG tablet Take 0.5-1 tablets (0.25-0.5 mg total) by mouth daily as needed for anxiety or sleep.   methocarbamol (ROBAXIN) 500 MG tablet Take 500 mg by mouth 2 (two) times daily.   propranolol ER (INDERAL LA) 60 MG 24 hr capsule Take 1 capsule (60 mg total) by mouth daily.   sodium chloride (OCEAN) 0.65 % SOLN nasal spray Place 1-2 sprays into both nostrils daily as needed for congestion.   telmisartan (MICARDIS) 40 MG tablet Take 60 mg (1 1/2/ pills) nightly   tiZANidine (ZANAFLEX) 2 MG tablet Take 1-2 tablets (2-4 mg total)  by mouth at bedtime as needed for muscle spasms.   topiramate (TOPAMAX) 50 MG tablet Take 100 mg by mouth 2 (two) times daily.   zolpidem (AMBIEN) 5 MG tablet Take 1 tablet (5 mg total) by mouth at bedtime as needed for sleep.   Allergies  Allergen Reactions   Pravastatin     Diarrhea/constipation per pt     Fish Allergy Nausea And Vomiting, Swelling  and Rash   No results found for this or any previous visit (from the past 2160 hour(s)). Objective  Body mass index is 25.4 kg/m. Wt Readings from Last 3 Encounters:  04/15/21 134 lb 0.3 oz (60.8 kg)  01/10/21 131 lb 3.2 oz (59.5 kg)  10/28/20 130 lb 9.6 oz (59.2 kg)   Temp Readings from Last 3 Encounters:  04/15/21 98.3 F (36.8 C) (Oral)  01/10/21 97.6 F (36.4 C) (Temporal)  10/13/19 98.6 F (37 C) (Oral)   BP Readings from Last 3 Encounters:  04/15/21 124/86  01/10/21 130/78  10/28/20 136/62   Pulse Readings from Last 3 Encounters:  01/10/21 (!) 54  10/28/20 (!) 53  10/19/19 60    Physical Exam Vitals and nursing note reviewed.  Constitutional:      Appearance: Normal appearance. She is well-developed and well-groomed.  HENT:     Head: Normocephalic and atraumatic.  Eyes:     Conjunctiva/sclera: Conjunctivae normal.     Pupils: Pupils are equal, round, and reactive to light.  Cardiovascular:     Rate and Rhythm: Normal rate and regular rhythm.     Heart sounds: Normal heart sounds. No murmur heard. Pulmonary:     Effort: Pulmonary effort is normal.     Breath sounds: Normal breath sounds.  Abdominal:     General: Abdomen is flat. Bowel sounds are normal.     Tenderness: There is no abdominal tenderness.  Musculoskeletal:        General: No tenderness.  Skin:    General: Skin is warm and dry.  Neurological:     General: No focal deficit present.     Mental Status: She is alert and oriented to person, place, and time. Mental status is at baseline.     Cranial Nerves: Cranial nerves 2-12 are intact.      Sensory: Sensation is intact.     Coordination: Coordination is intact.     Gait: Gait is intact.  Psychiatric:        Attention and Perception: Attention and perception normal.        Mood and Affect: Mood and affect normal.        Speech: Speech normal.        Behavior: Behavior normal. Behavior is cooperative.        Thought Content: Thought content normal.        Cognition and Memory: Cognition and memory normal.        Judgment: Judgment normal.    Assessment  Plan  Lung nodules - Plan: Ambulatory referral to Pulmonology  Abnormal CT of the chest - Plan: Ambulatory referral to Pulmonology  SOB (shortness of breath) - Plan: Ambulatory referral to Pulmonology  Scarring of lung - Plan: Ambulatory referral to Pulmonology  H/o asthma  Given Rx sample neb   Lumbar spondylolysis Declines Dr. Sharlet Salina for now  Disc pain clinic clinic  Disc pt declines    Mild dementia with mood disturbance, unspecified dementia type - Plan: Ambulatory referral to Neurology  Bilateral hearing loss, unspecified hearing loss type - Plan: Ambulatory referral to ENT  Abnormal gait  Rx rollator   Osteoporosis wants to start prolia again   HM Labs today had cmet, cbc, tfts stool studies all neg [facility] 11/21/20 and 11/27/20  Had flu shot utd  had, pna vx x 2 consider repeat pna 23 when it has been 5 years 11/09/2019 but new guidelines not needed with age >5   zoster consider shingrix in  future given Rx prev has not yet had   -prevnar 01/28/17, pna 23 11/09/14 confirmed, Tdap 01/06/13  covid vx had 2/2    Pap no h/o abnormal no GU blood has not had in a while though typically stop age 12.  Colonoscopy last 11/05/14 diverticulosis/hemorrhoids repeat in 10 years also consider cologuard  -est [facility] gi as of 01/10/21 pending colonoscopy mammo normal 04/25/19 -as of 01/10/21 order in needs to call and sch mammogram and dexa   dexa osteoporosis 07/2017 on prolia had shot recently 04/28/18 and 11/01/18 f/u q6  months -will repeat dEXA after 04/2019 Never smoker  Skin- as of 01/10/21 Provider: Dr. Olivia Mackie McLean-Scocuzza-Internal Medicine

## 2021-04-15 NOTE — Patient Instructions (Addendum)
[facility]   [place], [place] [address]   [phone]   Sabas Sous, DO   [facility]   [place]   [place], [place] [address]   [phone]   [phone] (Fax)   Lumbar radiculitis (Primary Dx);DDD (degenerative disc disease), lumbar;Lumbar stenosis with neurogenic claudication  [facility] [address]    Referral [facility]  Referral to ENT [facility]

## 2021-04-22 ENCOUNTER — Other Ambulatory Visit
Admission: RE | Admit: 2021-04-22 | Discharge: 2021-04-22 | Disposition: A | Discharge status: Home / Self Care | Payer: Medicare Other | Source: Ambulatory Visit | Attending: Pulmonary Disease | Admitting: Pulmonary Disease

## 2021-04-22 ENCOUNTER — Other Ambulatory Visit: Payer: Self-pay

## 2021-04-22 ENCOUNTER — Ambulatory Visit: Payer: Medicare Other | Admitting: Pulmonary Disease

## 2021-04-22 ENCOUNTER — Encounter: Payer: Self-pay | Admitting: Pulmonary Disease

## 2021-04-22 VITALS — BP 140/80 | HR 64 | Temp 96.8°F | Ht 62.0 in | Wt 135.2 lb

## 2021-04-22 DIAGNOSIS — J45909 Unspecified asthma, uncomplicated: Secondary | ICD-10-CM | POA: Diagnosis not present

## 2021-04-22 DIAGNOSIS — R634 Abnormal weight loss: Secondary | ICD-10-CM

## 2021-04-22 DIAGNOSIS — R053 Chronic cough: Secondary | ICD-10-CM | POA: Diagnosis not present

## 2021-04-22 DIAGNOSIS — R918 Other nonspecific abnormal finding of lung field: Secondary | ICD-10-CM | POA: Diagnosis not present

## 2021-04-22 DIAGNOSIS — R911 Solitary pulmonary nodule: Secondary | ICD-10-CM

## 2021-04-22 LAB — CBC WITH DIFFERENTIAL/PLATELET
Abs Immature Granulocytes: 0.01 10*3/uL (ref 0.00–0.07)
Basophils Absolute: 0 10*3/uL (ref 0.0–0.1)
Basophils Relative: 1 %
Eosinophils Absolute: 0.2 10*3/uL (ref 0.0–0.5)
Eosinophils Relative: 3 %
HCT: 35.6 % — ABNORMAL LOW (ref 36.0–46.0)
Hemoglobin: 11.5 g/dL — ABNORMAL LOW (ref 12.0–15.0)
Immature Granulocytes: 0 %
Lymphocytes Relative: 32 %
Lymphs Abs: 1.7 10*3/uL (ref 0.7–4.0)
MCH: 31.2 pg (ref 26.0–34.0)
MCHC: 32.3 g/dL (ref 30.0–36.0)
MCV: 96.5 fL (ref 80.0–100.0)
Monocytes Absolute: 0.4 10*3/uL (ref 0.1–1.0)
Monocytes Relative: 7 %
Neutro Abs: 3.1 10*3/uL (ref 1.7–7.7)
Neutrophils Relative %: 57 %
Platelets: 195 10*3/uL (ref 150–400)
RBC: 3.69 MIL/uL — ABNORMAL LOW (ref 3.87–5.11)
RDW: 12.8 % (ref 11.5–15.5)
WBC: 5.4 10*3/uL (ref 4.0–10.5)
nRBC: 0 % (ref 0.0–0.2)

## 2021-04-22 MED ORDER — TRELEGY ELLIPTA 100-62.5-25 MCG/ACT IN AEPB: 100.0000 ug | INHALATION_SPRAY | Freq: Every day | RESPIRATORY_TRACT | 0 refills | Status: DC

## 2021-04-22 MED ORDER — TRELEGY ELLIPTA 100-62.5-25 MCG/ACT IN AEPB: 1.0000 | INHALATION_SPRAY | Freq: Every day | RESPIRATORY_TRACT | 11 refills | Status: DC

## 2021-04-22 NOTE — Patient Instructions (Addendum)
We are going to give her a trial of an inhaler called Trelegy Ellipta 100, 1 inhalation daily.  Please let us know if she has any difficulty getting the inhaler.  Continue using the rescue inhaler as needed.  Make sure you rinse your mouth well after the use of the inhaler.  We are going to obtain breathing tests that will let us know about her lung function.  We will repeat a CT scan in May we will follow-up on the small little spots in her lung.  Our impressions from today is that your cough is related likely to cough variant asthma.  We may need to recommend that some of your heart medicines be changed if this is the case.  The breathing tests and a trial of the inhaler should give [place] an idea.  With regards to your weight loss I am concerned this may be related to the Topamax.  This is a common side effect of Topamax.  We will see you in follow-up in 4 to 6 weeks time call sooner should any new problems arise.

## 2021-04-22 NOTE — Progress Notes (Incomplete)
Subjective:    Patient ID: Gina Frazier, female    DOB: 03/11/1946, 76 y.o.   MRN: [id number] Chief Complaint  Patient presents with   Consult    Lung  nodule    HPI    Review of Systems A 10 point review of systems was performed and it is as noted above otherwise negative.  Past Medical History:  Diagnosis Date   Allergy    Anxiety    Asthma    Bell's palsy    left   Chronic back pain    Family history of adverse reaction to anesthesia    pts daughter had severe vomiting 2014    Hip joint pain    left   Hypertension    Osteoarthritis    PAF (paroxysmal atrial fibrillation) ([facility])    a. initially diagnosed 04/2014; b. on Eliquis; c. CHADS2VASc = 3   Palpitations    Wears contact lenses    Wears dentures    partial upper   Past Surgical History:  Procedure Laterality Date   CHOLECYSTECTOMY     COLONOSCOPY WITH PROPOFOL N/A 11/05/2014   Procedure: COLONOSCOPY WITH PROPOFOL;  Surgeon: Lucilla Lame, MD;  Location: [facility];  Service: Endoscopy;  Laterality: N/A;   JOINT REPLACEMENT     left knee x 2 04/2024 and 01/2017 Dr. 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   Procedure: TOTAL KNEE ARTHROPLASTY;  Surgeon: Hessie Knows, MD;  Location: [facility];  Service: Orthopedics;  Laterality: Left;   TOTAL KNEE REVISION Left 02/11/2017   Procedure: TOTAL KNEE REVISION, POLYETHELENE EXCHANGE;  Surgeon: Hessie Knows, MD;  Location: [facility];  Service: Orthopedics;  Laterality: Left;   Patient Active Problem List   Diagnosis Date Noted   Abnormal gait 04/15/2021   Mild intermittent asthma without complication 46/65/9935   Mild Alzheimer's dementia with anxiety ([facility] [facility]) 03/26/2021   Cataract of both eyes 01/13/2021    Grief reaction 08/05/2020   Aortic atherosclerosis ([facility]) 10/13/2019   Seasonal allergic rhinitis 06/29/2019   Hyperlipidemia 06/29/2019   Memory loss 02/16/2019   MDD (major depressive disorder), single episode, moderate ([facility]) 01/18/2019   GAD (generalized anxiety disorder) 01/18/2019   Right knee pain 06/07/2018   Insomnia due to mental disorder 06/07/2018   Osteoporosis 07/29/2017   Orthostatic hypotension 07/05/2017   Bradycardia 07/05/2017   Essential tremor 03/31/2017   Hip pain 03/31/2017   Hoarseness of voice 03/31/2017   Hyperglycemia 03/31/2017   Status post revision of total knee replacement, left 02/11/2017   Chronic pain of left knee 02/08/2017   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   Weight gain 03/18/2016   Numbness 01/28/2016   Chronic bilateral low back pain 12/12/2015   Weakness    Weight loss    PAF (paroxysmal atrial fibrillation) ([facility])    Primary osteoarthritis of knee 05/02/2015   Special screening for malignant neoplasms, colon    Snoring 05/01/2014   Anxiety 12/30/2010   Preoperative cardiovascular examination 12/30/2010   Hypertension 07/21/2010   Chronic back pain 07/21/2010   Vitamin D deficiency 07/21/2010   SHORTNESS OF BREATH 05/01/2010   Essential hypertension 04/18/2010   OSTEOARTHROS UNSPEC GEN/LOC OTH SPEC SITES 04/18/2010   KNEE PAIN, LEFT 04/18/2010   Family History  Problem Relation Age of Onset  Heart disease Mother    Hypertension Mother    Mental illness Mother    Diabetes Mother    Heart disease Father    Hypertension Sister    Depression Sister    Diabetes Sister    Breast cancer Sister 69   Bladder Cancer Sister    Heart disease Brother    Hypertension Brother    Arthritis Daughter    GER disease Daughter    Hypothyroidism Daughter    Lupus Daughter    Depression Daughter    Cancer Other        colon cancer, breast cancer    Heart disease Other    Breast cancer  Other    Depression Other    Social History   Tobacco Use   Smoking status: Never   Smokeless tobacco: Never  Substance Use Topics   Alcohol use: No   Allergies  Allergen Reactions   Pravastatin     Diarrhea/constipation per pt     Fish Allergy Nausea And Vomiting, Swelling and Rash   Current Meds  Medication Sig   acetaminophen (TYLENOL) 500 MG tablet Take 1,000 mg every 8 (eight) hours as needed by mouth for mild pain.   albuterol (PROVENTIL) (2.5 MG/3ML) 0.083% nebulizer solution Take 3 mLs (2.5 mg total) by nebulization every 6 (six) hours as needed for wheezing or shortness of breath.   albuterol (VENTOLIN HFA) 108 (90 Base) MCG/ACT inhaler Inhale 1-2 puffs into the lungs every 6 (six) hours as needed for wheezing or shortness of breath.   apixaban (ELIQUIS) 5 MG TABS tablet Take 1 tablet (5 mg total) by mouth 2 (two) times daily.   benzonatate (TESSALON) 200 MG capsule Take 1 capsule (200 mg total) by mouth 3 (three) times daily as needed for cough.   Calcium Carbonate-Vitamin D 600-400 MG-UNIT tablet Take by mouth.   Cholecalciferol (VITAMIN D) 2000 units tablet Take 2,000 Units by mouth daily.   donepezil (ARICEPT) 5 MG tablet Take 5 mg by mouth at bedtime.   flecainide (TAMBOCOR) 50 MG tablet Take 1 tablet (50 mg total) by mouth 2 (two) times daily.   ipratropium (ATROVENT) 0.06 % nasal spray Place 2 sprays into both nostrils 3 (three) times daily.   levocetirizine (XYZAL) 5 MG tablet Take 0.5-1 tablets (2.5-5 mg total) by mouth at bedtime as needed for allergies.   LORazepam (ATIVAN) 0.5 MG tablet Take 0.5-1 tablets (0.25-0.5 mg total) by mouth daily as needed for anxiety or sleep.   methocarbamol (ROBAXIN) 500 MG tablet Take 500 mg by mouth 2 (two) times daily.   propranolol ER (INDERAL LA) 60 MG 24 hr capsule Take 1 capsule (60 mg total) by mouth daily.   sodium chloride (OCEAN) 0.65 % SOLN nasal spray Place 1-2 sprays into both nostrils daily as  needed for congestion.   telmisartan (MICARDIS) 40 MG tablet Take 60 mg (1 1/2/ pills) nightly   tiZANidine (ZANAFLEX) 2 MG tablet Take 1-2 tablets (2-4 mg total) by mouth at bedtime as needed for muscle spasms.   topiramate (TOPAMAX) 50 MG tablet Take 100 mg by mouth 2 (two) times daily.   zolpidem (AMBIEN) 5 MG tablet Take 1 tablet (5 mg total) by mouth at bedtime as needed for sleep.   [DISCONTINUED] ezetimibe (ZETIA) 10 MG tablet Take 1 tablet (10 mg total) by mouth daily.       Objective:   Physical Exam BP 140/80 (BP Location: Left Arm, Patient Position: Sitting, Cuff Size: Normal)  Pulse 64    Temp (!) 96.8 F (36 C) (Oral)    Ht 5\' 2"  (1.575 m)    Wt 135 lb 3.2 oz (61.3 kg)    SpO2 97%    BMI 24.73 kg/m  GENERAL: HEAD: Normocephalic, atraumatic.  EYES: Pupils equal, round, reactive to light.  No scleral icterus.  MOUTH:  NECK: Supple. No thyromegaly. Trachea midline. No JVD.  No adenopathy. PULMONARY: Good air entry bilaterally.  No adventitious sounds. CARDIOVASCULAR: S1 and S2. Regular rate and rhythm.  ABDOMEN: MUSCULOSKELETAL: No joint deformity, no clubbing, no edema.  NEUROLOGIC:  SKIN: Intact,warm,dry. PSYCH:       Assessment & Plan:

## 2021-04-28 DIAGNOSIS — J4520 Mild intermittent asthma, uncomplicated: Secondary | ICD-10-CM | POA: Diagnosis not present

## 2021-04-28 LAB — ALLERGEN PANEL (27) + IGE
Alternaria Alternata IgE: <0.1 kU/L
Aspergillus Fumigatus IgE: <0.1 kU/L
Bahia Grass IgE: <0.1 kU/L
Bermuda Grass IgE: <0.1 kU/L
Cat Dander IgE: <0.1 kU/L
Cedar, Mountain IgE: <0.1 kU/L
Cladosporium Herbarum IgE: <0.1 kU/L
Cocklebur IgE: <0.1 kU/L
Cockroach, American IgE: <0.1 kU/L
Common Silver Birch IgE: <0.1 kU/L
D Farinae IgE: <0.1 kU/L
D Pteronyssinus IgE: <0.1 kU/L
Dog Dander IgE: 0.19 kU/L — AB
Elm, American IgE: <0.1 kU/L
Hickory, White IgE: <0.1 kU/L
IgE (Immunoglobulin E), Serum: 27 IU/mL (ref 6–495)
Johnson Grass IgE: <0.1 kU/L
Kentucky Bluegrass IgE: <0.1 kU/L
Maple/Box Elder IgE: <0.1 kU/L
Mucor Racemosus IgE: <0.1 kU/L
Oak, White IgE: <0.1 kU/L
Penicillium Chrysogen IgE: <0.1 kU/L
Pigweed, Rough IgE: <0.1 kU/L
Plantain, English IgE: <0.1 kU/L
Ragweed, Short IgE: 0.18 kU/L — AB
Setomelanomma Rostrat: <0.1 kU/L
Timothy Grass IgE: <0.1 kU/L
White Mulberry IgE: <0.1 kU/L

## 2021-04-29 ENCOUNTER — Telehealth: Payer: Self-pay | Admitting: Internal Medicine

## 2021-04-29 NOTE — Telephone Encounter (Signed)
See note below

## 2021-04-29 NOTE — Telephone Encounter (Signed)
Gina Frazier from [place], [phone]. She is looking for the order from 1/23 , order number is [id number]. She has not received it.

## 2021-05-01 ENCOUNTER — Other Ambulatory Visit: Payer: Self-pay | Admitting: Cardiovascular Disease

## 2021-05-01 DIAGNOSIS — I480 Paroxysmal atrial fibrillation: Secondary | ICD-10-CM

## 2021-05-01 NOTE — Telephone Encounter (Signed)
Eliquis 5mg  refill request received. Patient is 76 years old, weight-61.3kg, Crea-0.97 on 01/10/2021, Diagnosis-Afib, and last seen by Dr. Rockey Situ on 10/28/2020. Dose is appropriate based on dosing criteria. Will send in refill to requested pharmacy.

## 2021-05-01 NOTE — Telephone Encounter (Signed)
Refill Request.  

## 2021-05-05 NOTE — Telephone Encounter (Addendum)
Gina Frazier is calling about outstanding orders. Pt is a month out of medical compliance

## 2021-05-07 NOTE — Telephone Encounter (Signed)
Have been attempting to fax orders to [facility] on multiple Patient's. Have been getting fax failed notifications due to the number ringing busy/no signal. Will fax all Gina Frazier order again first thing tomorrow morning when Their fax lines are hopefully not busy.

## 2021-05-13 DIAGNOSIS — F0150 Vascular dementia without behavioral disturbance: Secondary | ICD-10-CM | POA: Diagnosis not present

## 2021-05-13 DIAGNOSIS — F0280 Dementia in other diseases classified elsewhere without behavioral disturbance: Secondary | ICD-10-CM | POA: Diagnosis not present

## 2021-05-13 DIAGNOSIS — G250 Essential tremor: Secondary | ICD-10-CM | POA: Diagnosis not present

## 2021-05-13 DIAGNOSIS — G309 Alzheimer's disease, unspecified: Secondary | ICD-10-CM | POA: Diagnosis not present

## 2021-05-19 ENCOUNTER — Telehealth: Payer: Self-pay | Admitting: Pulmonary Disease

## 2021-05-19 NOTE — Telephone Encounter (Signed)
Lm for reminder of covid test prior to PFT ? ?05/22/2021 between 8-12 at [employer] building.  ?

## 2021-05-20 NOTE — Telephone Encounter (Signed)
Lm x2 for patient. ?Will close encounter office protocol.  ? ?

## 2021-05-22 ENCOUNTER — Other Ambulatory Visit: Payer: Self-pay

## 2021-05-22 ENCOUNTER — Other Ambulatory Visit
Admission: RE | Admit: 2021-05-22 | Discharge: 2021-05-22 | Disposition: A | Discharge status: Home / Self Care | Payer: Medicare Other | Source: Ambulatory Visit | Attending: Pulmonary Disease | Admitting: Pulmonary Disease

## 2021-05-22 DIAGNOSIS — Z20822 Contact with and (suspected) exposure to covid-19: Secondary | ICD-10-CM | POA: Insufficient documentation

## 2021-05-22 DIAGNOSIS — Z01812 Encounter for preprocedural laboratory examination: Secondary | ICD-10-CM | POA: Insufficient documentation

## 2021-05-22 LAB — SARS CORONAVIRUS 2 (TAT 6-24 HRS): SARS Coronavirus 2: NEGATIVE

## 2021-05-23 ENCOUNTER — Ambulatory Visit: Payer: Medicare Other | Attending: Pulmonary Disease

## 2021-05-23 DIAGNOSIS — J45909 Unspecified asthma, uncomplicated: Secondary | ICD-10-CM | POA: Insufficient documentation

## 2021-05-27 DIAGNOSIS — H903 Sensorineural hearing loss, bilateral: Secondary | ICD-10-CM | POA: Diagnosis not present

## 2021-06-04 ENCOUNTER — Encounter: Payer: Self-pay | Admitting: Gastroenterology

## 2021-06-05 ENCOUNTER — Encounter
Admission: RE | Disposition: A | Discharge status: Home / Self Care | Payer: Self-pay | Source: Home / Self Care | Attending: Gastroenterology

## 2021-06-05 ENCOUNTER — Encounter: Payer: Self-pay | Admitting: Gastroenterology

## 2021-06-05 ENCOUNTER — Ambulatory Visit: Payer: Medicare Other | Admitting: Anesthesiology

## 2021-06-05 ENCOUNTER — Ambulatory Visit
Admission: RE | Admit: 2021-06-05 | Discharge: 2021-06-05 | Disposition: A | Discharge status: Home / Self Care | Payer: Medicare Other | Attending: Gastroenterology | Admitting: Gastroenterology

## 2021-06-05 DIAGNOSIS — I10 Essential (primary) hypertension: Secondary | ICD-10-CM | POA: Insufficient documentation

## 2021-06-05 DIAGNOSIS — K640 First degree hemorrhoids: Secondary | ICD-10-CM | POA: Insufficient documentation

## 2021-06-05 DIAGNOSIS — R634 Abnormal weight loss: Secondary | ICD-10-CM | POA: Insufficient documentation

## 2021-06-05 DIAGNOSIS — Z6823 Body mass index (BMI) 23.0-23.9, adult: Secondary | ICD-10-CM | POA: Insufficient documentation

## 2021-06-05 DIAGNOSIS — K449 Diaphragmatic hernia without obstruction or gangrene: Secondary | ICD-10-CM | POA: Diagnosis not present

## 2021-06-05 DIAGNOSIS — K921 Melena: Secondary | ICD-10-CM | POA: Insufficient documentation

## 2021-06-05 DIAGNOSIS — Z9049 Acquired absence of other specified parts of digestive tract: Secondary | ICD-10-CM | POA: Diagnosis not present

## 2021-06-05 DIAGNOSIS — K529 Noninfective gastroenteritis and colitis, unspecified: Secondary | ICD-10-CM | POA: Diagnosis not present

## 2021-06-05 DIAGNOSIS — R197 Diarrhea, unspecified: Secondary | ICD-10-CM | POA: Diagnosis not present

## 2021-06-05 DIAGNOSIS — K219 Gastro-esophageal reflux disease without esophagitis: Secondary | ICD-10-CM | POA: Diagnosis not present

## 2021-06-05 DIAGNOSIS — Z8379 Family history of other diseases of the digestive system: Secondary | ICD-10-CM | POA: Diagnosis not present

## 2021-06-05 DIAGNOSIS — K5730 Diverticulosis of large intestine without perforation or abscess without bleeding: Secondary | ICD-10-CM | POA: Insufficient documentation

## 2021-06-05 DIAGNOSIS — F0390 Unspecified dementia without behavioral disturbance: Secondary | ICD-10-CM | POA: Diagnosis not present

## 2021-06-05 DIAGNOSIS — R1310 Dysphagia, unspecified: Secondary | ICD-10-CM | POA: Diagnosis not present

## 2021-06-05 DIAGNOSIS — I480 Paroxysmal atrial fibrillation: Secondary | ICD-10-CM | POA: Diagnosis not present

## 2021-06-05 DIAGNOSIS — Z800 Family history of malignant neoplasm of digestive organs: Secondary | ICD-10-CM | POA: Insufficient documentation

## 2021-06-05 DIAGNOSIS — F419 Anxiety disorder, unspecified: Secondary | ICD-10-CM | POA: Insufficient documentation

## 2021-06-05 DIAGNOSIS — J45909 Unspecified asthma, uncomplicated: Secondary | ICD-10-CM | POA: Diagnosis not present

## 2021-06-05 DIAGNOSIS — K635 Polyp of colon: Secondary | ICD-10-CM | POA: Insufficient documentation

## 2021-06-05 DIAGNOSIS — K317 Polyp of stomach and duodenum: Secondary | ICD-10-CM | POA: Diagnosis not present

## 2021-06-05 HISTORY — DX: Hyperlipidemia, unspecified: E78.5

## 2021-06-05 HISTORY — DX: Polyneuropathy, unspecified: G62.9

## 2021-06-05 HISTORY — PX: ESOPHAGOGASTRODUODENOSCOPY (EGD) WITH PROPOFOL: SHX5813

## 2021-06-05 HISTORY — PX: COLONOSCOPY WITH PROPOFOL: SHX5780

## 2021-06-05 SURGERY — COLONOSCOPY WITH PROPOFOL
Anesthesia: General

## 2021-06-05 MED ORDER — PROPOFOL 500 MG/50ML IV EMUL
INTRAVENOUS | Status: AC
  Filled 2021-06-05: qty 50

## 2021-06-05 MED ORDER — SODIUM CHLORIDE 0.9 % IV SOLN: INTRAVENOUS | Status: DC

## 2021-06-05 MED ORDER — PROPOFOL 500 MG/50ML IV EMUL
INTRAVENOUS | Status: DC | PRN
  Administered 2021-06-05: 135 ug/kg/min via INTRAVENOUS

## 2021-06-05 MED ORDER — LIDOCAINE HCL (PF) 2 % IJ SOLN
INTRAMUSCULAR | Status: AC
Start: 2021-06-05 — End: ?
  Filled 2021-06-05: qty 5

## 2021-06-05 MED ORDER — LIDOCAINE HCL (CARDIAC) PF 100 MG/5ML IV SOSY
PREFILLED_SYRINGE | INTRAVENOUS | Status: DC | PRN
  Administered 2021-06-05: 50 mg via INTRAVENOUS

## 2021-06-05 MED ORDER — PROPOFOL 10 MG/ML IV BOLUS
INTRAVENOUS | Status: DC | PRN
  Administered 2021-06-05: 70 mg via INTRAVENOUS

## 2021-06-05 NOTE — Anesthesia Postprocedure Evaluation (Signed)
Anesthesia Post Note ? ?Patient: Gina Frazier ? ?Procedure(s) Performed: COLONOSCOPY WITH PROPOFOL ?ESOPHAGOGASTRODUODENOSCOPY (EGD) WITH PROPOFOL ? ?Patient location during evaluation: Endoscopy ?Anesthesia Type: General ?Level of consciousness: awake and alert ?Pain management: pain level controlled ?Vital Signs Assessment: post-procedure vital signs reviewed and stable ?Respiratory status: spontaneous breathing, nonlabored ventilation, respiratory function stable and patient connected to nasal cannula oxygen ?Cardiovascular status: blood pressure returned to baseline and stable ?Postop Assessment: no apparent nausea or vomiting ?Anesthetic complications: no ? ? ?No notable events documented. ? ? ?Last Vitals:  ?Vitals:  ? 06/05/21 1037 06/05/21 1038  ?BP: (!) 116/96 (!) 116/96  ?Pulse:  73  ?Resp: 17 15  ?Temp: (!) 36.1 ?C   ?SpO2:  100%  ?  ?Last Pain:  ?Vitals:  ? 06/05/21 1107  ?TempSrc:   ?PainSc: 0-No pain  ? ? ?  ?  ?  ?  ?  ?  ? ?Arita Miss ? ? ? ? ?

## 2021-06-05 NOTE — Op Note (Signed)
[facility] ?Gastroenterology ?Patient Name: Gina Frazier ?Procedure Date: 06/05/2021 9:40 AM ?MRN: [id number] ?Account #: [id number] ?Date of Birth: [birth year] ?Admit Type: Outpatient ?Age: 76 ?Room: [facility] ENDO ROOM 2 ?Gender: Female ?Note Status: Finalized ?Instrument Name: Colonoscope [id number] ?Procedure:             Colonoscopy ?Indications:           Hematochezia, Chronic diarrhea, Weight loss ?Providers:             [name] DO, DO ?Referring MD:          Nino Glow Mclean-Scocuzza MD, MD (Referring MD) ?Medicines:             Monitored Anesthesia Care ?Complications:         No immediate complications. Estimated blood loss:  ?                       Minimal. ?Procedure:             Pre-Anesthesia Assessment: ?                       - Prior to the procedure, a History and Physical was  ?                       performed, and patient medications and allergies were  ?                       reviewed. The patient is competent. The risks and  ?                       benefits of the procedure and the sedation options and  ?                       risks were discussed with the patient. All questions  ?                       were answered and informed consent was obtained.  ?                       Patient identification and proposed procedure were  ?                       verified by the physician, the nurse, the anesthetist  ?                       and the technician in the endoscopy suite. Mental  ?                       Status Examination: alert and oriented. Airway  ?                       Examination: normal oropharyngeal airway and neck  ?                       mobility. Respiratory Examination: clear to  ?                       auscultation. CV Examination: normal. Prophylactic  ?  Antibiotics: The patient does not require prophylactic  ?                       antibiotics. Prior Anticoagulants: The patient has  ?                       taken Eliquis (apixaban), last  dose was 3 days prior  ?                       to procedure. ASA Grade Assessment: III - A patient  ?                       with severe systemic disease. After reviewing the  ?                       risks and benefits, the patient was deemed in  ?                       satisfactory condition to undergo the procedure. The  ?                       anesthesia plan was to use monitored anesthesia care  ?                       (MAC). Immediately prior to administration of  ?                       medications, the patient was re-assessed for adequacy  ?                       to receive sedatives. The heart rate, respiratory  ?                       rate, oxygen saturations, blood pressure, adequacy of  ?                       pulmonary ventilation, and response to care were  ?                       monitored throughout the procedure. The physical  ?                       status of the patient was re-assessed after the  ?                       procedure. ?                       After obtaining informed consent, the colonoscope was  ?                       passed under direct vision. Throughout the procedure,  ?                       the patient's blood pressure, pulse, and oxygen  ?                       saturations were monitored continuously. The  ?  Colonoscope was introduced through the anus and  ?                       advanced to the the terminal ileum, with  ?                       identification of the appendiceal orifice and IC  ?                       valve. The colonoscopy was performed without  ?                       difficulty. The patient tolerated the procedure well.  ?                       The quality of the bowel preparation was evaluated  ?                       using the BBPS [facility] Bowel Preparation Scale) with  ?                       scores of: Right Colon = 3, Transverse Colon = 3 and  ?                       Left Colon = 3 (entire mucosa seen well with no  ?                        residual staining, small fragments of stool or opaque  ?                       liquid). The total BBPS score equals 9. The terminal  ?                       ileum, ileocecal valve, appendiceal orifice, and  ?                       rectum were photographed. ?Findings: ?     The perianal and digital rectal examinations were normal. Pertinent  ?     negatives include normal sphincter tone. ?     The terminal ileum appeared normal. Estimated blood loss: none. ?     Multiple small-mouthed diverticula were found in the entire colon.  ?     Estimated blood loss: none. ?     A 3 to 4 mm polyp was found in the ascending colon. The polyp was  ?     sessile. The polyp was removed with a cold snare. Resection and  ?     retrieval were complete. Estimated blood loss was minimal. ?     Normal mucosa was found in the entire colon. Biopsies for histology were  ?     taken with a cold forceps from the right colon and left colon for  ?     evaluation of microscopic colitis. Estimated blood loss was minimal. ?     Non-bleeding internal hemorrhoids were found during retroflexion. The  ?     hemorrhoids were Grade I (internal hemorrhoids that do not prolapse).  ?     Estimated blood loss: none. ?     The exam was otherwise  without abnormality on direct and retroflexion  ?     views. ?Impression:            - The examined portion of the ileum was normal. ?                       - Diverticulosis in the entire examined colon. ?                       - One 3 to 4 mm polyp in the ascending colon, removed  ?                       with a cold snare. Resected and retrieved. ?                       - Normal mucosa in the entire examined colon. Biopsied. ?                       - Non-bleeding internal hemorrhoids. ?                       - The examination was otherwise normal on direct and  ?                       retroflexion views. ?Recommendation:        - Discharge patient to home. ?                       - Resume previous diet. ?                        - Continue present medications. ?                       - Resume Eliquis (apixaban) at prior dose in 2 days.  ?                       Refer to managing physician for further adjustment of  ?                       therapy. ?                       - Await pathology results. ?                       - Repeat colonoscopy for surveillance based on  ?                       pathology results. ?                       - Return to GI office as previously scheduled. ?                       - The findings and recommendations were discussed with  ?                       the patient. ?Procedure Code(s):     --- Professional --- ?  45385, Colonoscopy, flexible; with removal of  ?                       tumor(s), polyp(s), or other lesion(s) by snare  ?                       technique ?                       45380, 59, Colonoscopy, flexible; with biopsy, single  ?                       or multiple ?Diagnosis Code(s):     --- Professional --- ?                       K64.0, First degree hemorrhoids ?                       K63.5, Polyp of colon ?                       K92.1, Melena (includes Hematochezia) ?                       K52.9, Noninfective gastroenteritis and colitis,  ?                       unspecified ?                       R63.4, Abnormal weight loss ?                       K57.30, Diverticulosis of large intestine without  ?                       perforation or abscess without bleeding ?CPT copyright 2019 [employer]. All rights reserved. ?The codes documented in this report are preliminary and upon coder review may  ?be revised to meet current compliance requirements. ?Attending Participation: ?     I personally performed the entire procedure. ?Volney American, DO ?[name] DO, DO ?06/05/2021 10:40:21 AM ?This report has been signed electronically. ?Number of Addenda: 0 ?Note Initiated On: 06/05/2021 9:40 AM ?Scope Withdrawal Time: 0 hours 14 minutes 18 seconds   ?Total Procedure Duration: 0 hours 22 minutes 49 seconds  ?Estimated Blood Loss:  Estimated blood loss was minimal. ?     [facility] ?

## 2021-06-05 NOTE — Op Note (Signed)
[facility] ?Gastroenterology ?Patient Name: Gina Frazier ?Procedure Date: 06/05/2021 9:40 AM ?MRN: [id number] ?Account #: [id number] ?Date of Birth: 26-Jan-1946 ?Admit Type: Outpatient ?Age: 76 ?Room: [facility] ENDO ROOM 2 ?Gender: Female ?Note Status: Finalized ?Instrument Name: Upper Endoscope [id number] ?Procedure:             Upper GI endoscopy ?Indications:           Diarrhea ?Providers:             [name] DO, DO ?Referring MD:          Nino Glow Mclean-Scocuzza MD, MD (Referring MD) ?Medicines:             Monitored Anesthesia Care ?Complications:         No immediate complications. Estimated blood loss:  ?                       Minimal. ?Procedure:             Pre-Anesthesia Assessment: ?                       - Prior to the procedure, a History and Physical was  ?                       performed, and patient medications and allergies were  ?                       reviewed. The patient is competent. The risks and  ?                       benefits of the procedure and the sedation options and  ?                       risks were discussed with the patient. All questions  ?                       were answered and informed consent was obtained.  ?                       Patient identification and proposed procedure were  ?                       verified by the physician, the nurse, the anesthetist  ?                       and the technician in the endoscopy suite. Mental  ?                       Status Examination: alert and oriented. Airway  ?                       Examination: normal oropharyngeal airway and neck  ?                       mobility. Respiratory Examination: clear to  ?                       auscultation. CV Examination: RRR, no murmurs, no S3  ?  or S4. Prophylactic Antibiotics: The patient does not  ?                       require prophylactic antibiotics. Prior  ?                       Anticoagulants: The patient has taken Eliquis  ?                        (apixaban), last dose was 3 days prior to procedure.  ?                       ASA Grade Assessment: III - A patient with severe  ?                       systemic disease. After reviewing the risks and  ?                       benefits, the patient was deemed in satisfactory  ?                       condition to undergo the procedure. The anesthesia  ?                       plan was to use monitored anesthesia care (MAC).  ?                       Immediately prior to administration of medications,  ?                       the patient was re-assessed for adequacy to receive  ?                       sedatives. The heart rate, respiratory rate, oxygen  ?                       saturations, blood pressure, adequacy of pulmonary  ?                       ventilation, and response to care were monitored  ?                       throughout the procedure. The physical status of the  ?                       patient was re-assessed after the procedure. ?                       After obtaining informed consent, the endoscope was  ?                       passed under direct vision. Throughout the procedure,  ?                       the patient's blood pressure, pulse, and oxygen  ?                       saturations were monitored continuously. The Endoscope  ?  was introduced through the mouth, and advanced to the  ?                       second part of duodenum. The upper GI endoscopy was  ?                       accomplished without difficulty. The patient tolerated  ?                       the procedure well. ?Findings: ?     The duodenal bulb, first portion of the duodenum and second portion of  ?     the duodenum were normal. Biopsies for histology were taken with a cold  ?     forceps for evaluation of celiac disease. Estimated blood loss was  ?     minimal. ?     A small hiatal hernia was present. Estimated blood loss: none. ?     The entire examined stomach was normal. Biopsies were taken with a  cold  ?     forceps for Helicobacter pylori testing. Estimated blood loss was  ?     minimal. ?     The Z-line was regular. Estimated blood loss: none. ?     Esophagogastric landmarks were identified: the gastroesophageal junction  ?     was found at 35 cm from the incisors. ?     The examined esophagus was normal. The scope was withdrawn. Dilation was  ?     performed with a Maloney dilator with no resistance at 52 Fr. The  ?     dilation site was examined following endoscope reinsertion and showed no  ?     change. Estimated blood loss: none. ?     The exam was otherwise without abnormality. ?Impression:            - Normal duodenal bulb, first portion of the duodenum  ?                       and second portion of the duodenum. Biopsied. ?                       - Small hiatal hernia. ?                       - Normal stomach. Biopsied. ?                       - Z-line regular. ?                       - Esophagogastric landmarks identified. ?                       - Normal esophagus. Dilated. ?                       - The examination was otherwise normal. ?Recommendation:        - Discharge patient to home. ?                       - Resume previous diet. ?                       -  Continue present medications. ?                       - Await pathology results. ?                       - Return to referring physician as previously  ?                       scheduled. ?                       - The findings and recommendations were discussed with  ?                       the patient. ?Procedure Code(s):     --- Professional --- ?                       [phone], Esophagogastroduodenoscopy, flexible,  ?                       transoral; with biopsy, single or multiple ?                       43450, Dilation of esophagus, by unguided sound or  ?                       bougie, single or multiple passes ?Diagnosis Code(s):     --- Professional --- ?                       K44.9, Diaphragmatic hernia without obstruction or  ?                        gangrene ?                       R19.7, Diarrhea, unspecified ?CPT copyright 2019 American Medical Association. All rights reserved. ?The codes documented in this report are preliminary and upon coder review may  ?be revised to meet current compliance requirements. ?Attending Participation: ?     I personally performed the entire procedure. ?Volney American, DO ?[name] DO, DO ?06/05/2021 10:07:48 AM ?This report has been signed electronically. ?Number of Addenda: 0 ?Note Initiated On: 06/05/2021 9:40 AM ?Estimated Blood Loss:  Estimated blood loss was minimal. ?     [facility] ?

## 2021-06-05 NOTE — Interval H&P Note (Signed)
History and Physical Interval Note: Preprocedure H&P from 06/05/21 ? was reviewed and there was no interval change after seeing and examining the patient.  Written consent was obtained from the patient after discussion of risks, benefits, and alternatives. Patient has consented to proceed with Esophagogastroduodenoscopy and Colonoscopy with possible intervention ? ? ?06/05/2021 ?9:49 AM ? ?Gina Frazier  has presented today for surgery, with the diagnosis of K21.9 - Gastroesophageal reflux disease, unspecified whether esophagitis present ?R13.10 - Dysphagia, unspecified type ?R63.4 - Weight loss ?R19.7 - Diarrhea, unspecified type ?K62.5  - Rectal bleeding.  The various methods of treatment have been discussed with the patient and family. After consideration of risks, benefits and other options for treatment, the patient has consented to  Procedure(s) with comments: ?COLONOSCOPY WITH PROPOFOL (N/A) - ELIQUIS ?ESOPHAGOGASTRODUODENOSCOPY (EGD) WITH PROPOFOL (N/A) as a surgical intervention.  The patient's history has been reviewed, patient examined, no change in status, stable for surgery.  I have reviewed the patient's chart and labs.  Questions were answered to the patient's satisfaction.   ? ? ?[name] ? ? ?

## 2021-06-05 NOTE — Anesthesia Preprocedure Evaluation (Signed)
Anesthesia Evaluation  ?Patient identified by MRN, date of birth, ID band ?Patient awake ? ? ? ?Reviewed: ?Allergy & Precautions, NPO status , Patient's Chart, lab work & pertinent test results ? ?History of Anesthesia Complications ?(+) Family history of anesthesia reactionNegative for: history of anesthetic complications ? ?Airway ?Mallampati: III ? ?TM Distance: >3 FB ?Neck ROM: Full ? ? ? Dental ? ?(+) Partial Lower, Partial Upper ?  ?Pulmonary ?asthma , neg sleep apnea, neg COPD, Patient abstained from smoking.Not current smoker,  ?  ?Pulmonary exam normal ?breath sounds clear to auscultation ? ? ? ? ? ? Cardiovascular ?Exercise Tolerance: Good ?METShypertension, Pt. on medications ?(-) CAD, (-) Past MI and (-) CHF + dysrhythmias (hx of afib, currently sinus) Atrial Fibrillation (-) Valvular Problems/Murmurs ?Rhythm:Regular Rate:Normal ?- Systolic murmurs ? ?  ?Neuro/Psych ?neg Seizures PSYCHIATRIC DISORDERS Anxiety Depression Dementia negative neurological ROS ?   ? GI/Hepatic ?Neg liver ROS, neg GERD  ,  ?Endo/Other  ?neg diabetes ? Renal/GU ?negative Renal ROS  ? ?  ?Musculoskeletal ? ? Abdominal ?  ?Peds ? Hematology ?  ?Anesthesia Other Findings ?Past Medical History: ?No date: Allergy ?No date: Anxiety ?No date: Asthma ?No date: Bell's palsy ?    Comment:  left ?No date: Chronic back pain ?No date: Family history of adverse reaction to anesthesia ?    Comment:  pts daughter had severe vomiting 2014  ?No date: Hip joint pain ?    Comment:  left ?No date: HLD (hyperlipidemia) ?No date: Hypertension ?No date: Neuropathy ?No date: Osteoarthritis ?No date: PAF (paroxysmal atrial fibrillation) ([place]) ?    Comment:  a. initially diagnosed 04/2014; b. on Eliquis; c.  ?             CHADS2VASc = 3 ?No date: Palpitations ?No date: Wears contact lenses ?No date: Wears dentures ?    Comment:  partial upper ? Reproductive/Obstetrics ? ?  ? ? ? ? ? ? ? ? ? ? ? ? ? ?  ?   ? ? ? ? ? ? ? ? ?Anesthesia Physical ? ?Anesthesia Plan ? ?ASA: 3 ? ?Anesthesia Plan: General  ? ?Post-op Pain Management: Minimal or no pain anticipated  ? ?Induction: Intravenous ? ?PONV Risk Score and Plan: 3 and Ondansetron, Treatment may vary due to age or medical condition, TIVA and Propofol infusion ? ?Airway Management Planned: Natural Airway ? ?Additional Equipment: None ? ?Intra-op Plan:  ? ?Post-operative Plan:  ? ?Informed Consent: I have reviewed the patients History and Physical, chart, labs and discussed the procedure including the risks, benefits and alternatives for the proposed anesthesia with the patient or authorized representative who has indicated his/her understanding and acceptance.  ? ? ? ?Dental advisory given ? ?Plan Discussed with: CRNA and Surgeon ? ?Anesthesia Plan Comments: (Discussed risks of anesthesia with patient, including possibility of difficulty with spontaneous ventilation under anesthesia necessitating airway intervention, PONV, and rare risks such as cardiac or respiratory or neurological events, and allergic reactions. Discussed the role of CRNA in patient's perioperative care. Patient understands.)  ? ? ? ? ? ? ?Anesthesia Quick Evaluation ? ?

## 2021-06-05 NOTE — Transfer of Care (Signed)
Immediate Anesthesia Transfer of Care Note ? ?Patient: Gina Frazier ? ?Procedure(s) Performed: COLONOSCOPY WITH PROPOFOL ?ESOPHAGOGASTRODUODENOSCOPY (EGD) WITH PROPOFOL ? ?Patient Location: PACU ? ?Anesthesia Type:General ? ?Level of Consciousness: awake and drowsy ? ?Airway & Oxygen Therapy: Patient Spontanous Breathing ? ?Post-op Assessment: Report given to RN and Post -op Vital signs reviewed and stable ? ?Post vital signs: Reviewed and stable ? ?Last Vitals:  ?Vitals Value Taken Time  ?BP 116/96 06/05/21 1038  ?Temp 36.1 ?C 06/05/21 1037  ?Pulse 73 06/05/21 1038  ?Resp 15 06/05/21 1038  ?SpO2 100 % 06/05/21 1038  ? ? ?Last Pain:  ?Vitals:  ? 06/05/21 1037  ?TempSrc: Temporal  ?PainSc:   ?   ? ?  ? ?Complications: No notable events documented. ?

## 2021-06-05 NOTE — H&P (Signed)
? ?Pre-Procedure H&P ?  ?Patient ID: Myana Schlup is a 76 y.o. female. ? ?Gastroenterology Provider: [name], DO ? ?Referring Provider: Laurine Blazer, PA ?PCP: McLean-Scocuzza, Nino Glow, MD ? ?Date: 06/05/2021 ? ?HPI ?Ms. Laporscha Mirkin is a 76 y.o. female who presents today for Esophagogastroduodenoscopy and Colonoscopy for dysphagia; weight loss, bloody, diarrhea, fhx colon cancer and IBD. ? ?Starting May 2022 the patient noted weight loss with bloody diarrhea.  She has gone from 168 to 134 pounds, however, her weight has stabilized.  Her granddaughter has celiac disease and the patient transition to gluten-free diet which has greatly improved her symptoms.  She was tested for celiac serologies in September 2022 which were negative, however, is unclear if she was on a gluten-free diet at that point in time.  The patient denies any abdominal pain or reflux.  She does note dysphagia to breads and liquids 20 to her xiphoid process for things sticking.  She denies any odynophagia or dysphagia to liquids.  She does note early satiety. ? ?Eliquis has been held since Monday evening. ? ?Brother with colon cancer in his 60s.  Sister with ulcerative colitis ?The patient has had a cholecystectomy ? ?Last colonoscopy here in 2016 with diverticulosis and hemorrhoids  ? ?Past Medical History:  ?Diagnosis Date  ? Allergy   ? Anxiety   ? Asthma   ? Bell's palsy   ? left  ? Chronic back pain   ? Family history of adverse reaction to anesthesia   ? pts daughter had severe vomiting 2014   ? Hip joint pain   ? left  ? HLD (hyperlipidemia)   ? Hypertension   ? Neuropathy   ? Osteoarthritis   ? PAF (paroxysmal atrial fibrillation) ([place])   ? a. initially diagnosed 04/2014; b. on Eliquis; c. CHADS2VASc = 3  ? Palpitations   ? Wears contact lenses   ? Wears dentures   ? partial upper  ? ? ?Past Surgical History:  ?Procedure Laterality Date  ? CHOLECYSTECTOMY    ? COLONOSCOPY WITH PROPOFOL N/A 11/05/2014  ? Procedure:  COLONOSCOPY WITH PROPOFOL;  Surgeon: Lucilla Lame, MD;  Location: [facility];  Service: Endoscopy;  Laterality: N/A;  ? JOINT REPLACEMENT    ? left knee x 2 04/2024 and 01/2017 Dr. Rudene Christians   ? KNEE ARTHROSCOPY Left 06/28/2014  ? Procedure: ARTHROSCOPY KNEE WITH PARTIALLATERAL MENISECTOMY AND DEBRIDEMENT;  Surgeon: Susa Day, MD;  Location: [facility];  Service: Orthopedics;  Laterality: Left;  ? TOOTH EXTRACTION    ? TOTAL KNEE ARTHROPLASTY Left 05/02/2015  ? Procedure: TOTAL KNEE ARTHROPLASTY;  Surgeon: Hessie Knows, MD;  Location: [facility];  Service: Orthopedics;  Laterality: Left;  ? TOTAL KNEE REVISION Left 02/11/2017  ? Procedure: TOTAL KNEE REVISION, POLYETHELENE EXCHANGE;  Surgeon: Hessie Knows, MD;  Location: [facility];  Service: Orthopedics;  Laterality: Left;  ? ? ?Family History ?Brother- crc- 70s ?Sister- UC ?No other h/o GI disease or malignancy ? ?Review of Systems  ?Constitutional:  Positive for unexpected weight change. Negative for activity change, appetite change, chills, diaphoresis, fatigue and fever.  ?HENT:  Positive for trouble swallowing. Negative for voice change.   ?Respiratory:  Negative for shortness of breath and wheezing.   ?Cardiovascular:  Negative for chest pain, palpitations and leg swelling.  ?Gastrointestinal:  Positive for blood in stool and diarrhea. Negative for abdominal distention, abdominal pain, anal bleeding, constipation, nausea, rectal pain and vomiting.  ?     Early satiety  ?Musculoskeletal:  Negative for arthralgias and myalgias.  ?Skin:  Negative for color change and pallor.  ?Neurological:  Negative for dizziness, syncope and weakness.  ?Psychiatric/Behavioral:  Negative for confusion.   ?All other systems reviewed and are negative.  ? ?Medications ?No current facility-administered medications on file prior to encounter.  ? ?Current Outpatient Medications on File Prior to Encounter  ?Medication Sig Dispense Refill  ? flecainide (TAMBOCOR) 50 MG tablet Take 1  tablet (50 mg total) by mouth 2 (two) times daily. 180 tablet 3  ? losartan (COZAAR) 50 MG tablet Take 50 mg by mouth daily.    ? Multiple Vitamins-Minerals (MULTIVITAMIN WITH MINERALS) tablet Take 1 tablet by mouth daily.    ? nitroGLYCERIN (NITROSTAT) 0.4 MG SL tablet Place 0.4 mg under the tongue every 5 (five) minutes as needed for chest pain.    ? propranolol ER (INDERAL LA) 60 MG 24 hr capsule Take 1 capsule (60 mg total) by mouth daily. 90 capsule 3  ? acetaminophen (TYLENOL) 500 MG tablet Take 1,000 mg every 8 (eight) hours as needed by mouth for mild pain.    ? albuterol (VENTOLIN HFA) 108 (90 Base) MCG/ACT inhaler Inhale 1-2 puffs into the lungs every 6 (six) hours as needed for wheezing or shortness of breath. 18 g 12  ? benzonatate (TESSALON) 200 MG capsule Take 1 capsule (200 mg total) by mouth 3 (three) times daily as needed for cough. 30 capsule 2  ? Calcium Carbonate-Vitamin D 600-400 MG-UNIT tablet Take by mouth.    ? Cholecalciferol (VITAMIN D) 2000 units tablet Take 2,000 Units by mouth daily.    ? diltiazem (CARDIZEM CD) 120 MG 24 hr capsule Take 1 capsule (120 mg total) by mouth daily. 90 capsule 3  ? donepezil (ARICEPT) 5 MG tablet Take 5 mg by mouth at bedtime. (Patient not taking: Reported on 06/05/2021)    ? ipratropium (ATROVENT) 0.06 % nasal spray Place 2 sprays into both nostrils 3 (three) times daily. 15 mL 12  ? levocetirizine (XYZAL) 5 MG tablet Take 0.5-1 tablets (2.5-5 mg total) by mouth at bedtime as needed for allergies. 90 tablet 3  ? LORazepam (ATIVAN) 0.5 MG tablet Take 0.5-1 tablets (0.25-0.5 mg total) by mouth daily as needed for anxiety or sleep. 20 tablet 0  ? methocarbamol (ROBAXIN) 500 MG tablet Take 500 mg by mouth 2 (two) times daily.    ? sodium chloride (OCEAN) 0.65 % SOLN nasal spray Place 1-2 sprays into both nostrils daily as needed for congestion. 30 mL 12  ? telmisartan (MICARDIS) 40 MG tablet Take 60 mg (1 1/2/ pills) nightly 120 tablet 3  ? tiZANidine (ZANAFLEX)  2 MG tablet Take 1-2 tablets (2-4 mg total) by mouth at bedtime as needed for muscle spasms. 60 tablet 2  ? topiramate (TOPAMAX) 50 MG tablet Take 100 mg by mouth 2 (two) times daily.    ? zolpidem (AMBIEN) 5 MG tablet Take 1 tablet (5 mg total) by mouth at bedtime as needed for sleep. 30 tablet 5  ? ? ?Pertinent medications related to GI and procedure were reviewed by me with the patient prior to the procedure ? ? ?Current Facility-Administered Medications:  ?  0.9 %  sodium chloride infusion, , Intravenous, Continuous, [name], DO, Last Rate: 20 mL/hr at 06/05/21 0920, Continued from Pre-op at 06/05/21 0920 ? sodium chloride 20 mL/hr at 06/05/21 0920  ?  ?  ? ?Allergies  ?Allergen Reactions  ? Pravastatin   ?  Diarrhea/constipation per pt  ?  ?  Fish Allergy Nausea And Vomiting, Swelling and Rash  ? ?Allergies were reviewed by me prior to the procedure ? ?Objective  ? ? ?Vitals:  ? 06/05/21 0850  ?BP: (!) 174/75  ?Pulse: 70  ?Resp: 18  ?Temp: (!) 97.1 ?F (36.2 ?C)  ?TempSrc: Temporal  ?SpO2: 99%  ?Weight: 59 kg  ?Height: '5\' 2"'$  (1.575 m)  ? ? ?Physical Exam ?Vitals and nursing note reviewed.  ?Constitutional:   ?   General: She is not in acute distress. ?   Appearance: Normal appearance. She is not ill-appearing, toxic-appearing or diaphoretic.  ?HENT:  ?   Head: Normocephalic and atraumatic.  ?   Nose: Nose normal.  ?   Mouth/Throat:  ?   Mouth: Mucous membranes are moist.  ?   Pharynx: Oropharynx is clear.  ?   Comments: Poor dentition, missing teeth ?Eyes:  ?   General: No scleral icterus. ?   Extraocular Movements: Extraocular movements intact.  ?Cardiovascular:  ?   Rate and Rhythm: Normal rate and regular rhythm.  ?   Heart sounds: Normal heart sounds. No murmur heard. ?  No friction rub. No gallop.  ?Pulmonary:  ?   Effort: Pulmonary effort is normal. No respiratory distress.  ?   Breath sounds: Normal breath sounds. No wheezing, rhonchi or rales.  ?Abdominal:  ?   General: Bowel sounds are  normal. There is no distension.  ?   Palpations: Abdomen is soft.  ?   Tenderness: There is no abdominal tenderness. There is no guarding or rebound.  ?Musculoskeletal:  ?   Cervical back: Neck supple.  ?   Right lowe

## 2021-06-05 NOTE — Anesthesia Procedure Notes (Signed)
Date/Time: 06/05/2021 9:52 AM ?Performed by: Johnna Acosta, CRNA ?Pre-anesthesia Checklist: Patient identified, Emergency Drugs available, Suction available, Patient being monitored and Timeout performed ?Patient Re-evaluated:Patient Re-evaluated prior to induction ?Oxygen Delivery Method: Nasal cannula ?Preoxygenation: Pre-oxygenation with 100% oxygen ?Induction Type: IV induction ? ? ? ? ?

## 2021-06-06 ENCOUNTER — Encounter: Payer: Self-pay | Admitting: Gastroenterology

## 2021-06-09 ENCOUNTER — Other Ambulatory Visit: Payer: Self-pay

## 2021-06-09 ENCOUNTER — Encounter: Payer: Self-pay | Admitting: Pulmonary Disease

## 2021-06-09 ENCOUNTER — Ambulatory Visit: Payer: Medicare Other | Admitting: Pulmonary Disease

## 2021-06-09 VITALS — BP 118/80 | HR 68 | Temp 97.5°F | Ht 62.0 in | Wt 137.4 lb

## 2021-06-09 DIAGNOSIS — R918 Other nonspecific abnormal finding of lung field: Secondary | ICD-10-CM

## 2021-06-09 DIAGNOSIS — R053 Chronic cough: Secondary | ICD-10-CM | POA: Diagnosis not present

## 2021-06-09 DIAGNOSIS — R634 Abnormal weight loss: Secondary | ICD-10-CM | POA: Diagnosis not present

## 2021-06-09 DIAGNOSIS — J4530 Mild persistent asthma, uncomplicated: Secondary | ICD-10-CM | POA: Diagnosis not present

## 2021-06-09 LAB — SURGICAL PATHOLOGY

## 2021-06-09 NOTE — Progress Notes (Signed)
? ?Subjective:  ? ? Patient ID: Gina Frazier, female    DOB: [birth year], 76 y.o.   MRN: [id number] ?Patient Care Team: ?McLean-Scocuzza, Nino Glow, MD as PCP - General (Internal Medicine) ?Minna Merritts, MD as PCP - Cardiology (Cardiology) ?Minna Merritts, MD (Cardiology) ? ?Chief Complaint  ?Patient presents with  ?? Follow-up  ? ? ?HPI ?Gina Frazier is a 76 year old lifelong never smoker who follows here for the issue of lung nodules, cough and shortness of breath.  She was initially seen here on 22 April 2021 for the details of that said please refer to that note.  This is a follow-up visit.  She presents today with her daughter who adds to history.  She had pulmonary function testing performed 23 May 2021.  This showed well-preserved spirometric values.  The patient did have significant difficulty performing the test however the effort independent portions of the test did not show evidence of overt obstruction.  Patient does have small airways component.  Diffusion capacity was normal.  No further assessment can be made to due to patient's inability to perform the test optimally.  She does have mild to moderate dementia and has some difficulties with following directions. ? ?The patient discontinued Topamax after last visit.  She has noted some improvement on her appetite though she is still eating less than prior.  She has been followed by neurology and  Namenda has been started as it appears that her dementia is progressing. ? ?Her cough has resolved and shortness of breath has improved on Trelegy.  Overall she feels that she is doing better.  Her allergen panel was negative.  She has been scheduled for follow-up CT for her lung nodules. ? ? ? ?Review of Systems ?A 10 point review of systems was performed and it is as noted above otherwise negative. ? ?Patient Active Problem List  ? Diagnosis Date Noted  ?? Abnormal gait 04/15/2021  ?? Mild intermittent asthma without complication 05/69/7948  ?? 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 Cataract of both eyes 01/13/2021  ?? Grief reaction 08/05/2020  ?? Aortic atherosclerosis ([place]) 10/13/2019  ?? Seasonal allergic rhinitis 06/29/2019  ?? Hyperlipidemia 06/29/2019  ?? Memory loss 02/16/2019  ?? MDD (major depressive disorder), single episode, moderate ([place]) 01/18/2019  ?? GAD (generalized anxiety disorder) 01/18/2019  ?? Right knee pain 06/07/2018  ?? 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 Osteoporosis 07/29/2017  ?? Orthostatic hypotension 07/05/2017  ?? Bradycardia 07/05/2017  ?? Essential tremor 03/31/2017  ?? Hip pain 03/31/2017  ?? Hoarseness of voice 03/31/2017  ?? Hyperglycemia 03/31/2017  ?? Status post revision of total knee replacement, left 02/11/2017  ?? Chronic pain of left knee 02/08/2017  ?? 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 Weight gain 03/18/2016  ?? Numbness 01/28/2016  ?? Chronic bilateral low back pain 12/12/2015  ?? Weakness   ?? Weight loss   ?? PAF (paroxysmal atrial fibrillation) ([place])   ?? Primary osteoarthritis of knee 05/02/2015  ?? Special screening for malignant neoplasms, colon   ?? Snoring 05/01/2014  ?? Anxiety 12/30/2010  ?? Preoperative cardiovascular examination 12/30/2010  ?? Hypertension 07/21/2010  ?? Chronic back pain 07/21/2010  ?? Vitamin D deficiency 07/21/2010  ?? SHORTNESS OF BREATH 05/01/2010  ?? Essential hypertension 04/18/2010  ?? OSTEOARTHROS UNSPEC GEN/LOC OTH SPEC SITES 04/18/2010  ?? KNEE PAIN, LEFT 04/18/2010  ? ?Social History  ? ?Tobacco Use  ?? Smoking status: Never  ?? Smokeless tobacco: Never  ?Substance Use Topics  ?? Alcohol  use: No  ? ?Current Meds  ?Medication Sig  ?? acetaminophen (TYLENOL) 500 MG tablet Take 1,000 mg every 8 (eight) hours as needed by mouth for mild pain.  ?? albuterol (PROVENTIL) (2.5 MG/3ML) 0.083% nebulizer solution Take 3 mLs (2.5 mg total) by nebulization every 6 (six) hours as needed for wheezing or shortness of breath.  ?? albuterol (VENTOLIN HFA)  108 (90 Base) MCG/ACT inhaler Inhale 1-2 puffs into the lungs every 6 (six) hours as needed for wheezing or shortness of breath.  ?? benzonatate (TESSALON) 200 MG capsule Take 1 capsule (200 mg total) by mouth 3 (three) times daily as needed for cough.  ?? Calcium Carbonate-Vitamin D 600-400 MG-UNIT tablet Take by mouth.  ?? Cholecalciferol (VITAMIN D) 2000 units tablet Take 2,000 Units by mouth daily.  ?? donepezil (ARICEPT) 5 MG tablet Take 5 mg by mouth at bedtime.  ?? ELIQUIS 5 MG TABS tablet TAKE 1 TABLET BY MOUTH TWICE A DAY  ?? flecainide (TAMBOCOR) 50 MG tablet Take 1 tablet (50 mg total) by mouth 2 (two) times daily.  ?? Fluticasone-Umeclidin-Vilant (TRELEGY ELLIPTA) 100-62.5-25 MCG/ACT AEPB Inhale 1 puff into the lungs daily.  ?? Fluticasone-Umeclidin-Vilant (TRELEGY ELLIPTA) 100-62.5-25 MCG/ACT AEPB Inhale 100 mcg into the lungs daily.  ?? ipratropium (ATROVENT) 0.06 % nasal spray Place 2 sprays into both nostrils 3 (three) times daily.  ?? levocetirizine (XYZAL) 5 MG tablet Take 0.5-1 tablets (2.5-5 mg total) by mouth at bedtime as needed for allergies.  ?? LORazepam (ATIVAN) 0.5 MG tablet Take 0.5-1 tablets (0.25-0.5 mg total) by mouth daily as needed for anxiety or sleep.  ?? losartan (COZAAR) 50 MG tablet Take 50 mg by mouth daily.  ?? methocarbamol (ROBAXIN) 500 MG tablet Take 500 mg by mouth 2 (two) times daily.  ?? Multiple Vitamins-Minerals (MULTIVITAMIN WITH MINERALS) tablet Take 1 tablet by mouth daily.  ?? nitroGLYCERIN (NITROSTAT) 0.4 MG SL tablet Place 0.4 mg under the tongue every 5 (five) minutes as needed for chest pain.  ?? propranolol ER (INDERAL LA) 60 MG 24 hr capsule Take 1 capsule (60 mg total) by mouth daily.  ?? sodium chloride (OCEAN) 0.65 % SOLN nasal spray Place 1-2 sprays into both nostrils daily as needed for congestion.  ?? telmisartan (MICARDIS) 40 MG tablet Take 60 mg (1 1/2/ pills) nightly  ?? tiZANidine (ZANAFLEX) 2 MG tablet Take 1-2 tablets (2-4 mg total) by mouth at  bedtime as needed for muscle spasms.  ?? zolpidem (AMBIEN) 5 MG tablet Take 1 tablet (5 mg total) by mouth at bedtime as needed for sleep.  ? ?Immunization History  ?Administered Date(s) Administered  ?? Fluad Quad(high Dose 65+) 11/10/2018, 12/11/2019, 12/05/2020  ?? Hepatitis B 03/16/2014, 10/11/2014, 11/25/2014, 07/29/2015  ?? Hepatitis B, adult 03/16/2014, 10/11/2014, 11/25/2014, 07/29/2015  ?? Influenza Split 01/05/2011, 12/23/2011, 01/06/2013  ?? Influenza Whole 04/19/2010  ?? Influenza, High Dose Seasonal PF 12/17/2015, 02/12/2017, 12/03/2017  ?? Moderna Covid-19 Vaccine Bivalent Booster 45yr & up 01/23/2021  ?? Moderna SARS-COV2 Booster Vaccination 02/15/2020  ?? Moderna Sars-Covid-2 Vaccination 04/28/2019, 05/27/2019  ?? Pneumococcal Conjugate-13 09/29/2013, 01/28/2017  ?? Pneumococcal Polysaccharide-23 07/21/2010  ?? Td 04/19/2010  ?? Tdap 01/06/2013  ?? Zoster, Live 07/31/2013  ? ? ?   ?Objective:  ? Physical Exam ?BP 118/80 (BP Location: Left Arm, Patient Position: Sitting, Cuff Size: Normal)   Pulse 68   Temp (!) 97.5 ?F (36.4 ?C) (Oral)   Ht '5\' 2"'$  (1.575 m)   Wt 137 lb 6.4 oz (62.3 kg)   SpO2  98%   BMI 25.13 kg/m?  ?GENERAL: Well-developed well-nourished woman, fully ambulatory, no acute distress.  No conversational dyspnea. ?HEAD: Normocephalic, atraumatic.  ?EYES: Pupils equal, round, reactive to light.  No scleral icterus.  ?MOUTH: Nose/mouth/throat not examined due to masking requirements for COVID 19. ?NECK: Supple. No thyromegaly. Trachea midline. No JVD.  No adenopathy. ?PULMONARY: Good air entry bilaterally.  No adventitious sounds. ?CARDIOVASCULAR: S1 and S2. Regular rate and rhythm.  ?ABDOMEN: Benign. ?MUSCULOSKELETAL: No joint deformity, no clubbing, no edema.  No Raynaud's noted today. ?NEUROLOGIC: Mild resting tremor (familial) no other focality. ?SKIN: Intact,warm,dry.  On limited exam, no rashes.   ?PSYCH: Mood and behavior normal. ? ?Recent Results (from the past 2160 hour(s))   ?Allergen Panel (27) + IGE     Status: Abnormal  ? Collection Time: 04/22/21 10:43 AM  ?Result Value Ref Range  ? Class Description Allergens Comment   ?  Comment: (NOTE) ?   Levels of Specific IgE       Class  Descri

## 2021-06-09 NOTE — Patient Instructions (Signed)
You have refills of the Trelegy at your pharmacy.  Continue using it.  Make sure you rinse your mouth well after you use it. ? ?We will see you in follow-up in 4 months time.  Call sooner should you have any new issues. ? ?Previously you were scheduled for a chest CT, we will check on this once this is done and will we will let you know the results. ? ? ?

## 2021-07-09 ENCOUNTER — Other Ambulatory Visit: Payer: Self-pay | Admitting: Internal Medicine

## 2021-07-09 DIAGNOSIS — G4700 Insomnia, unspecified: Secondary | ICD-10-CM

## 2021-07-14 NOTE — Telephone Encounter (Signed)
Lembo, Jaylanni ? ?New Id :[id number] ? ?Old Id :[id number] ? ?DOB:[birth year] ? ?[address] [place], [place], [address] ? ?[facility] ?Current case status ?BENEFIT VERIFICATION IN PROGRESS ?

## 2021-07-15 ENCOUNTER — Other Ambulatory Visit: Payer: Self-pay | Admitting: Internal Medicine

## 2021-07-15 DIAGNOSIS — J4520 Mild intermittent asthma, uncomplicated: Secondary | ICD-10-CM

## 2021-07-15 DIAGNOSIS — R059 Cough, unspecified: Secondary | ICD-10-CM

## 2021-07-15 MED ORDER — ALBUTEROL SULFATE HFA 108 (90 BASE) MCG/ACT IN AERS: 1.0000 | INHALATION_SPRAY | Freq: Four times a day (QID) | RESPIRATORY_TRACT | 12 refills | Status: DC | PRN

## 2021-07-15 MED ORDER — ALBUTEROL SULFATE (2.5 MG/3ML) 0.083% IN NEBU: 2.5000 mg | INHALATION_SOLUTION | Freq: Four times a day (QID) | RESPIRATORY_TRACT | 11 refills | Status: AC | PRN

## 2021-07-30 ENCOUNTER — Ambulatory Visit (INDEPENDENT_AMBULATORY_CARE_PROVIDER_SITE_OTHER): Payer: Medicare Other

## 2021-07-30 VITALS — Ht 62.0 in | Wt 137.0 lb

## 2021-07-30 DIAGNOSIS — Z0000 Encounter for general adult medical examination without abnormal findings: Secondary | ICD-10-CM | POA: Diagnosis not present

## 2021-07-30 NOTE — Patient Instructions (Addendum)
?  Gina Frazier , ?Thank you for taking time to come for your Medicare Wellness Visit. I appreciate your ongoing commitment to your health goals. Please review the following plan we discussed and let me know if I can assist you in the future.  ? ?These are the goals we discussed: ? Goals   ? ?  Follow up with Primary Care Provider   ?  As needed. ?Stay active. ?  ? ?  ?  ?This is a list of the screening recommended for you and due dates:  ?Health Maintenance  ?Topic Date Due  ? COVID-19 Vaccine (3 - Moderna risk series) 08/15/2021*  ? Zoster (Shingles) Vaccine (1 of 2) 10/30/2021*  ? Flu Shot  10/14/2021  ? Tetanus Vaccine  01/07/2023  ? Pneumonia Vaccine  Completed  ? DEXA scan (bone density measurement)  Completed  ? Hepatitis C Screening: USPSTF Recommendation to screen - Ages 85-79 yo.  Completed  ? HPV Vaccine  Aged Out  ? Colon Cancer Screening  Discontinued  ?*Topic was postponed. The date shown is not the original due date.  ?  ?

## 2021-07-30 NOTE — Progress Notes (Signed)
Subjective:   Gina Frazier is a 76 y.o. female who presents for Medicare Annual (Subsequent) preventive examination.  Review of Systems    No ROS.  Medicare Wellness Virtual Visit.  Visual/audio telehealth visit, UTA vital signs.   See social history for additional risk factors.   Cardiac Risk Factors include: advanced age (>67men, >25 women)     Objective:    Today's Vitals   07/30/21 1535  Weight: 137 lb (62.1 kg)  Height: 5\' 2"  (1.575 m)   Body mass index is 25.06 kg/m.     07/30/2021    3:43 PM 06/05/2021    8:48 AM 10/12/2019   10:44 AM 07/22/2019   11:46 PM 07/22/2019   12:45 AM 04/27/2018    2:46 PM 06/09/2017    3:04 AM  Advanced Directives  Does Patient Have a Medical Advance Directive? No No No No No  No  Would patient like information on creating a medical advance directive? No - Patient declined  No - Patient declined         Information is confidential and restricted. Go to Review Flowsheets to unlock data.    Current Medications (verified) Outpatient Encounter Medications as of 07/30/2021  Medication Sig   acetaminophen (TYLENOL) 500 MG tablet Take 1,000 mg every 8 (eight) hours as needed by mouth for mild pain.   albuterol (PROVENTIL) (2.5 MG/3ML) 0.083% nebulizer solution Take 3 mLs (2.5 mg total) by nebulization every 6 (six) hours as needed for wheezing or shortness of breath.   albuterol (VENTOLIN HFA) 108 (90 Base) MCG/ACT inhaler Inhale 1-2 puffs into the lungs every 6 (six) hours as needed for wheezing or shortness of breath.   benzonatate (TESSALON) 200 MG capsule Take 1 capsule (200 mg total) by mouth 3 (three) times daily as needed for cough.   Calcium Carbonate-Vitamin D 600-400 MG-UNIT tablet Take by mouth.   Cholecalciferol (VITAMIN D) 2000 units tablet Take 2,000 Units by mouth daily.   diltiazem (CARDIZEM CD) 120 MG 24 hr capsule Take 1 capsule (120 mg total) by mouth daily.   donepezil (ARICEPT) 5 MG tablet Take 5 mg by mouth at bedtime.    ELIQUIS 5 MG TABS tablet TAKE 1 TABLET BY MOUTH TWICE A DAY   flecainide (TAMBOCOR) 50 MG tablet Take 1 tablet (50 mg total) by mouth 2 (two) times daily.   Fluticasone-Umeclidin-Vilant (TRELEGY ELLIPTA) 100-62.5-25 MCG/ACT AEPB Inhale 1 puff into the lungs daily.   Fluticasone-Umeclidin-Vilant (TRELEGY ELLIPTA) 100-62.5-25 MCG/ACT AEPB Inhale 100 mcg into the lungs daily.   ipratropium (ATROVENT) 0.06 % nasal spray Place 2 sprays into both nostrils 3 (three) times daily.   levocetirizine (XYZAL) 5 MG tablet Take 0.5-1 tablets (2.5-5 mg total) by mouth at bedtime as needed for allergies.   LORazepam (ATIVAN) 0.5 MG tablet Take 0.5-1 tablets (0.25-0.5 mg total) by mouth daily as needed for anxiety or sleep.   losartan (COZAAR) 50 MG tablet Take 50 mg by mouth daily.   methocarbamol (ROBAXIN) 500 MG tablet Take 500 mg by mouth 2 (two) times daily.   Multiple Vitamins-Minerals (MULTIVITAMIN WITH MINERALS) tablet Take 1 tablet by mouth daily.   nitroGLYCERIN (NITROSTAT) 0.4 MG SL tablet Place 0.4 mg under the tongue every 5 (five) minutes as needed for chest pain.   propranolol ER (INDERAL LA) 60 MG 24 hr capsule Take 1 capsule (60 mg total) by mouth daily.   sodium chloride (OCEAN) 0.65 % SOLN nasal spray Place 1-2 sprays into both nostrils daily as needed  for congestion.   telmisartan (MICARDIS) 40 MG tablet Take 60 mg (1 1/2/ pills) nightly   tiZANidine (ZANAFLEX) 2 MG tablet Take 1-2 tablets (2-4 mg total) by mouth at bedtime as needed for muscle spasms.   topiramate (TOPAMAX) 50 MG tablet Take 100 mg by mouth 2 (two) times daily.   zolpidem (AMBIEN) 5 MG tablet TAKE 1 TABLET(5 MG) BY MOUTH AT BEDTIME AS NEEDED FOR SLEEP   No facility-administered encounter medications on file as of 07/30/2021.    Allergies (verified) Pravastatin and Fish allergy   History: Past Medical History:  Diagnosis Date   Allergy    Anxiety    Asthma    Bell's palsy    left   Chronic back pain    Family  history of adverse reaction to anesthesia    pts daughter had severe vomiting 2014    Hip joint pain    left   HLD (hyperlipidemia)    Hypertension    Neuropathy    Osteoarthritis    PAF (paroxysmal atrial fibrillation) (HCC)    a. initially diagnosed 04/2014; b. on Eliquis; c. CHADS2VASc = 3   Palpitations    Wears contact lenses    Wears dentures    partial upper   Past Surgical History:  Procedure Laterality Date   CHOLECYSTECTOMY     COLONOSCOPY WITH PROPOFOL N/A 11/05/2014   Procedure: COLONOSCOPY WITH PROPOFOL;  Surgeon: Midge Minium, MD;  Location: [facility];  Service: Endoscopy;  Laterality: N/A;   COLONOSCOPY WITH PROPOFOL N/A 06/05/2021   Procedure: COLONOSCOPY WITH PROPOFOL;  Surgeon: Jaynie Collins, DO;  Location: [facility];  Service: Gastroenterology;  Laterality: N/A;  ELIQUIS   ESOPHAGOGASTRODUODENOSCOPY (EGD) WITH PROPOFOL N/A 06/05/2021   Procedure: ESOPHAGOGASTRODUODENOSCOPY (EGD) WITH PROPOFOL;  Surgeon: Jaynie Collins, DO;  Location: [facility];  Service: Gastroenterology;  Laterality: N/A;   JOINT REPLACEMENT     left knee x 2 04/2024 and 01/2017 Dr. 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   Procedure: TOTAL KNEE ARTHROPLASTY;  Surgeon: Kennedy Bucker, MD;  Location: [facility];  Service: Orthopedics;  Laterality: Left;   TOTAL KNEE REVISION Left 02/11/2017   Procedure: TOTAL KNEE REVISION, POLYETHELENE EXCHANGE;  Surgeon: Kennedy Bucker, MD;  Location: [facility];  Service: Orthopedics;  Laterality: Left;   Family History  Problem Relation Age of Onset   Heart disease Mother    Hypertension Mother    Mental illness Mother    Diabetes Mother    Heart disease Father    Hypertension Sister    Depression Sister     Diabetes Sister    Breast cancer Sister 110   Bladder Cancer Sister    Heart disease Brother    Hypertension Brother    Arthritis Daughter    GER disease Daughter    Hypothyroidism Daughter    Lupus Daughter    Depression Daughter    Cancer Other        colon cancer, breast cancer    Heart disease Other    Breast cancer Other    Depression Other    Social History   Socioeconomic History   Marital status: Divorced    Spouse name: Not on file   Number of children: 2   Years of education:  Not on file   Highest education level: Not on file  Occupational History   Not on file  Tobacco Use   Smoking status: Never   Smokeless tobacco: Never  Vaping Use   Vaping Use: Never used  Substance and Sexual Activity   Alcohol use: No   Drug use: No   Sexual activity: Never  Other Topics Concern   Not on file  Social History Narrative   2 daughters 1 lives [place] the other [place] pt visits both    Lives with daughter in [place] and grand daughter       Social Determinants of Health   Financial Resource Strain: Low Risk    Difficulty of Paying Living Expenses: Not hard at all  Food Insecurity: No Food Insecurity   Worried About Programme researcher, broadcasting/film/video in the Last Year: Never true   Barista in the Last Year: Never true  Transportation Needs: No Transportation Needs   Lack of Transportation (Medical): No   Lack of Transportation (Non-Medical): No  Physical Activity: Not on file  Stress: No Stress Concern Present   Feeling of Stress : Not at all  Social Connections: Unknown   Frequency of Communication with Friends and Family: Not on file   Frequency of Social Gatherings with Friends and Family: Not on file   Attends Religious Services: Never   Database administrator or Organizations: No   Attends Banker Meetings: Never   Marital Status: Divorced    Tobacco Counseling Counseling given: Not Answered   Clinical Intake:  Pre-visit preparation  completed: Yes        Diabetes: No  How often do you need to have someone help you when you read instructions, pamphlets, or other written materials from your doctor or pharmacy?: 1 - Never    Interpreter Needed?: No      Activities of Daily Living    07/30/2021    3:48 PM  In your present state of health, do you have any difficulty performing the following activities:  Hearing? 0  Vision? 0  Difficulty concentrating or making decisions? 0  Walking or climbing stairs? 0  Dressing or bathing? 0  Doing errands, shopping? 1  Preparing Food and eating ? N  Using the Toilet? N  In the past six months, have you accidently leaked urine? N  Do you have problems with loss of bowel control? N  Managing your Medications? N  Managing your Finances? N  Housekeeping or managing your Housekeeping? N    Patient Care Team: McLean-Scocuzza, Pasty Spillers, MD as PCP - General (Internal Medicine) Antonieta Iba, MD as PCP - Cardiology (Cardiology) Antonieta Iba, MD (Cardiology)  Indicate any recent Medical Services you may have received from other than [facility] providers in the past year (date may be approximate).     Assessment:   This is a routine wellness examination for Gina Frazier.  Virtual Visit via Telephone Note  I connected with  Gina Frazier on 07/30/21 at  3:30 PM EDT by telephone and verified that I am speaking with the correct person using two identifiers.  Persons participating in the virtual visit: patient/Nurse Health Advisor   I discussed the limitations of performing an evaluation and management service by telehealth. We continued and completed visit with audio only. Some vital signs may be absent or patient reported.   Hearing/Vision screen Hearing Screening - Comments:: Patient is able to hear conversational tones without difficulty.  No issues  reported. Vision Screening - Comments:: Followed by Jordan Hawks  Wears corrective lenses  They have seen their ophthalmologist  in the last 12 months.   Dietary issues and exercise activities discussed: Current Exercise Habits: Home exercise routine Healthy diet Good water intake   Goals Addressed             This Visit's Progress    Follow up with Primary Care Provider       As needed. Stay active.       Depression Screen    07/30/2021    3:41 PM 01/10/2021    1:24 PM 10/12/2019   10:45 AM 06/29/2019   11:20 AM 02/16/2019    1:21 PM 01/18/2019   10:34 AM 07/05/2017    9:13 AM  PHQ 2/9 Scores  PHQ - 2 Score 0 1 1 0 1  1  PHQ- 9 Score     4  8     Information is confidential and restricted. Go to Review Flowsheets to unlock data.    Fall Risk    07/30/2021    3:48 PM 01/10/2021    1:24 PM 10/13/2019   10:09 AM 10/12/2019   10:45 AM 07/27/2019   10:29 AM  Fall Risk   Falls in the past year? 0 1 1 1  0  Number falls in past yr: 0 0 0 0 0  Comment    Patient slipped on a tray on the floor   Injury with Fall?  0 0 0 0  Risk for fall due to :  History of fall(s);Impaired balance/gait Impaired balance/gait;History of fall(s);Impaired mobility  Impaired balance/gait;Impaired mobility  Follow up Falls evaluation completed Falls evaluation completed Falls evaluation completed Falls evaluation completed Falls evaluation completed    FALL RISK PREVENTION PERTAINING TO THE HOME: Home free of loose throw rugs in walkways, pet beds, electrical cords, etc? Yes  Adequate lighting in your home to reduce risk of falls? Yes   ASSISTIVE DEVICES UTILIZED TO PREVENT FALLS: Life alert? No  Use of a cane, walker or w/c? No . Grab bars in the bathroom? Yes  Shower chair or bench in shower? Yes  Elevated toilet seat or a handicapped toilet? Yes   TIMED UP AND GO: Was the test performed? No .   Cognitive Function:  Patient is alert. Mixed Alzheimer's dementia. Taking medication as directed.       10/12/2019   10:57 AM  6CIT Screen  What Year? 0 points  What month? 0 points  Months in reverse 4 points   Repeat phrase 4 points    Immunizations Immunization History  Administered Date(s) Administered   Fluad Quad(high Dose 65+) 11/10/2018, 12/11/2019, 12/05/2020   Hepatitis B 03/16/2014, 10/11/2014, 11/25/2014, 07/29/2015   Hepatitis B, adult 03/16/2014, 10/11/2014, 11/25/2014, 07/29/2015   Influenza Split 01/05/2011, 12/23/2011, 01/06/2013   Influenza Whole 04/19/2010   Influenza, High Dose Seasonal PF 12/17/2015, 02/12/2017, 12/03/2017   Moderna Covid-19 Vaccine Bivalent Booster 42yrs & up 01/23/2021   Moderna SARS-COV2 Booster Vaccination 02/15/2020   Moderna Sars-Covid-2 Vaccination 04/28/2019, 05/27/2019   Pneumococcal Conjugate-13 09/29/2013, 01/28/2017   Pneumococcal Polysaccharide-23 07/21/2010   Td 04/19/2010   Tdap 01/06/2013   Zoster, Live 07/31/2013   Screening Tests Health Maintenance  Topic Date Due   COVID-19 Vaccine (3 - Moderna risk series) 08/15/2021 (Originally 01/23/2021)   Zoster Vaccines- Shingrix (1 of 2) 10/30/2021 (Originally 08/09/1964)   INFLUENZA VACCINE  10/14/2021   TETANUS/TDAP  01/07/2023   Pneumonia Vaccine 37+ Years old  Completed  DEXA SCAN  Completed   Hepatitis C Screening  Completed   HPV VACCINES  Aged Out   COLONOSCOPY (Pts 45-33yrs Insurance coverage will need to be confirmed)  Discontinued   Health Maintenance There are no preventive care reminders to display for this patient.  Lung Cancer Screening: (Low Dose CT Chest recommended if Age 63-80 years, 30 pack-year currently smoking OR have quit w/in 15years.) does not qualify.   Vision Screening: Recommended annual ophthalmology exams for early detection of glaucoma and other disorders of the eye.  Dental Screening: Recommended annual dental exams for proper oral hygiene  Community Resource Referral / Chronic Care Management: CRR required this visit?  No   CCM required this visit?  No      Plan:   Keep all routine maintenance appointments.   I have personally reviewed and  noted the following in the patient's chart:   Medical and social history Use of alcohol, tobacco or illicit drugs  Current medications and supplements including opioid prescriptions.  Functional ability and status Nutritional status Physical activity Advanced directives List of other physicians Hospitalizations, surgeries, and ER visits in previous 12 months Vitals Screenings to include cognitive, depression, and falls Referrals and appointments  In addition, I have reviewed and discussed with patient certain preventive protocols, quality metrics, and best practice recommendations. A written personalized care plan for preventive services as well as general preventive health recommendations were provided to patient.     Ashok Pall, LPN   1/61/0960

## 2021-08-01 NOTE — Telephone Encounter (Signed)
I spoke with daughter (same name) after receiving permission to discuss from patient. She has been informed that she would be responsible for $276 on the day of her injection if she decides to proceed. Pt also aware that she could get it on the day of her OV on 5/31 with PCP.   Daughter will discuss in further detail with mother & will let us know if she is willing to proceed.  Note: I also mentioned to the daughter that she need to have her mom update her DPR so that we can talk with her.

## 2021-08-13 ENCOUNTER — Ambulatory Visit (INDEPENDENT_AMBULATORY_CARE_PROVIDER_SITE_OTHER): Payer: Medicare Other | Admitting: Internal Medicine

## 2021-08-13 ENCOUNTER — Encounter: Payer: Self-pay | Admitting: Internal Medicine

## 2021-08-13 VITALS — BP 124/80 | HR 64 | Temp 97.9°F | Resp 14 | Ht 62.0 in | Wt 145.4 lb

## 2021-08-13 DIAGNOSIS — L609 Nail disorder, unspecified: Secondary | ICD-10-CM

## 2021-08-14 ENCOUNTER — Ambulatory Visit (INDEPENDENT_AMBULATORY_CARE_PROVIDER_SITE_OTHER): Payer: Medicare Other | Admitting: Internal Medicine

## 2021-08-14 ENCOUNTER — Encounter: Payer: Self-pay | Admitting: Internal Medicine

## 2021-08-14 VITALS — BP 122/70 | HR 54 | Temp 98.1°F | Resp 14 | Ht 62.0 in | Wt 143.8 lb

## 2021-08-14 DIAGNOSIS — M810 Age-related osteoporosis without current pathological fracture: Secondary | ICD-10-CM

## 2021-08-14 DIAGNOSIS — L603 Nail dystrophy: Secondary | ICD-10-CM | POA: Insufficient documentation

## 2021-08-14 DIAGNOSIS — Z1231 Encounter for screening mammogram for malignant neoplasm of breast: Secondary | ICD-10-CM

## 2021-08-14 DIAGNOSIS — G4700 Insomnia, unspecified: Secondary | ICD-10-CM

## 2021-08-14 DIAGNOSIS — J302 Other seasonal allergic rhinitis: Secondary | ICD-10-CM

## 2021-08-14 DIAGNOSIS — H04203 Unspecified epiphora, bilateral lacrimal glands: Secondary | ICD-10-CM

## 2021-08-14 DIAGNOSIS — G300 Alzheimer's disease with early onset: Secondary | ICD-10-CM

## 2021-08-14 DIAGNOSIS — R5383 Other fatigue: Secondary | ICD-10-CM

## 2021-08-14 DIAGNOSIS — E611 Iron deficiency: Secondary | ICD-10-CM

## 2021-08-14 DIAGNOSIS — Z1329 Encounter for screening for other suspected endocrine disorder: Secondary | ICD-10-CM

## 2021-08-14 DIAGNOSIS — B354 Tinea corporis: Secondary | ICD-10-CM | POA: Diagnosis not present

## 2021-08-14 DIAGNOSIS — I10 Essential (primary) hypertension: Secondary | ICD-10-CM

## 2021-08-14 DIAGNOSIS — F02A0 Dementia in other diseases classified elsewhere, mild, without behavioral disturbance, psychotic disturbance, mood disturbance, and anxiety: Secondary | ICD-10-CM

## 2021-08-14 LAB — CBC WITH DIFFERENTIAL/PLATELET
Basophils Absolute: 0 10*3/uL (ref 0.0–0.1)
Basophils Relative: 0.9 % (ref 0.0–3.0)
Eosinophils Absolute: 0.2 10*3/uL (ref 0.0–0.7)
Eosinophils Relative: 3.2 % (ref 0.0–5.0)
HCT: 36.7 % (ref 36.0–46.0)
Hemoglobin: 12 g/dL (ref 12.0–15.0)
Lymphocytes Relative: 30.3 % (ref 12.0–46.0)
Lymphs Abs: 1.5 10*3/uL (ref 0.7–4.0)
MCHC: 32.7 g/dL (ref 30.0–36.0)
MCV: 94.7 fl (ref 78.0–100.0)
Monocytes Absolute: 0.4 10*3/uL (ref 0.1–1.0)
Monocytes Relative: 7.4 % (ref 3.0–12.0)
Neutro Abs: 2.8 10*3/uL (ref 1.4–7.7)
Neutrophils Relative %: 58.2 % (ref 43.0–77.0)
Platelets: 205 10*3/uL (ref 150.0–400.0)
RBC: 3.87 Mil/uL (ref 3.87–5.11)
RDW: 14.3 % (ref 11.5–15.5)
WBC: 4.8 10*3/uL (ref 4.0–10.5)

## 2021-08-14 MED ORDER — HYDROCORTISONE 2.5 % EX CREA: TOPICAL_CREAM | Freq: Two times a day (BID) | CUTANEOUS | 0 refills | Status: DC

## 2021-08-14 MED ORDER — IPRATROPIUM BROMIDE 0.06 % NA SOLN: 2.0000 | Freq: Three times a day (TID) | NASAL | 12 refills | Status: AC

## 2021-08-14 MED ORDER — PATADAY 0.7 % OP SOLN: 1.0000 [drp] | Freq: Every day | OPHTHALMIC | 22 refills | Status: DC

## 2021-08-14 MED ORDER — KETOCONAZOLE 2 % EX CREA: 1.0000 "application " | TOPICAL_CREAM | Freq: Every day | CUTANEOUS | 0 refills | Status: DC

## 2021-08-14 MED ORDER — TELMISARTAN 40 MG PO TABS: ORAL_TABLET | ORAL | 3 refills | Status: DC

## 2021-08-14 MED ORDER — ZOLPIDEM TARTRATE 5 MG PO TABS: ORAL_TABLET | ORAL | 5 refills | Status: DC

## 2021-08-14 NOTE — Progress Notes (Signed)
Daughter seen instead of pt and pt rescheduled appt

## 2021-08-14 NOTE — Progress Notes (Signed)
Chief Complaint  Patient presents with   Follow-up    4 mon   F/u with daughter  1. C/o watery eyes with discharge at times  2. Asthma controlled trelegy lower dose daily is helping and sob improved  3. C/o chipping brittle nails with ridges in her nails  4. Rash to posterior right thigh not itchingn w/o pain  5. Htn controlled on telmisartan 60 mg qd per cardiology  6. Alhlzehemeirs seeing neurology on aripcept 5 mg qhs and namenda 5 mg bid Dr. Lanelle Bal Neurology last seen 05/13/21    Review of Systems  Constitutional:  Negative for weight loss.  HENT:  Negative for hearing loss.   Eyes:  Negative for blurred vision.  Respiratory:  Negative for shortness of breath.   Cardiovascular:  Negative for chest pain.  Gastrointestinal:  Negative for abdominal pain and blood in stool.  Genitourinary:  Negative for dysuria.  Musculoskeletal:  Negative for falls and joint pain.  Skin:  Negative for rash.  Neurological:  Negative for headaches.  Psychiatric/Behavioral:  Negative for depression.   Past Medical History:  Diagnosis Date   Allergy    Anxiety    Asthma    Bell's palsy    left   Chronic back pain    Family history of adverse reaction to anesthesia    pts daughter had severe vomiting 2014    Hip joint pain    left   HLD (hyperlipidemia)    Hypertension    Neuropathy    Osteoarthritis    PAF (paroxysmal atrial fibrillation) ([place])    a. initially diagnosed 04/2014; b. on Eliquis; c. CHADS2VASc = 3   Palpitations    Wears contact lenses    Wears dentures    partial upper   Past Surgical History:  Procedure Laterality Date   CHOLECYSTECTOMY     COLONOSCOPY WITH PROPOFOL N/A 11/05/2014   Procedure: COLONOSCOPY WITH PROPOFOL;  Surgeon: Lucilla Lame, MD;  Location: [facility];  Service: Endoscopy;  Laterality: N/A;   COLONOSCOPY WITH PROPOFOL N/A 06/05/2021   Procedure: COLONOSCOPY WITH PROPOFOL;  Surgeon: [name], DO;  Location: [facility] ENDOSCOPY;   Service: Gastroenterology;  Laterality: N/A;  ELIQUIS   ESOPHAGOGASTRODUODENOSCOPY (EGD) WITH PROPOFOL N/A 06/05/2021   Procedure: ESOPHAGOGASTRODUODENOSCOPY (EGD) WITH PROPOFOL;  Surgeon: [name], DO;  Location: [facility] ENDOSCOPY;  Service: Gastroenterology;  Laterality: N/A;   JOINT REPLACEMENT     left knee x 2 04/2024 and 01/2017 Dr. 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   Procedure: TOTAL KNEE ARTHROPLASTY;  Surgeon: Hessie Knows, MD;  Location: [facility];  Service: Orthopedics;  Laterality: Left;   TOTAL KNEE REVISION Left 02/11/2017   Procedure: TOTAL KNEE REVISION, POLYETHELENE EXCHANGE;  Surgeon: Hessie Knows, MD;  Location: [facility];  Service: Orthopedics;  Laterality: Left;   Family History  Problem Relation Age of Onset   Heart disease Mother    Hypertension Mother    Mental illness Mother    Diabetes Mother    Heart disease Father    Hypertension Sister    Depression Sister    Diabetes Sister    Breast cancer Sister 59   Bladder Cancer Sister    Heart disease Brother    Hypertension Brother  Arthritis Daughter    GER disease Daughter    Hypothyroidism Daughter    Lupus Daughter    Depression Daughter    Cancer Other        colon cancer, breast cancer    Heart disease Other    Breast cancer Other    Depression Other    Social History   Socioeconomic History   Marital status: Divorced    Spouse name: Not on file   Number of children: 2   Years of education: Not on file   Highest education level: Not on file  Occupational History   Not on file  Tobacco Use   Smoking status: Never   Smokeless tobacco: Never  Vaping Use   Vaping Use: Never used  Substance and Sexual Activity   Alcohol use: No   Drug use: No    Sexual activity: Never  Other Topics Concern   Not on file  Social History Narrative   2 daughters 1 lives [place] the other [place] pt visits both    Lives with daughter in [place] and grand daughter       Social Determinants of Health   Financial Resource Strain: Low Risk    Difficulty of Paying Living Expenses: Not hard at all  Food Insecurity: No Food Insecurity   Worried About Charity fundraiser in the Last Year: Never true   Arboriculturist in the Last Year: Never true  Transportation Needs: No Transportation Needs   Lack of Transportation (Medical): No   Lack of Transportation (Non-Medical): No  Physical Activity: Not on file  Stress: No Stress Concern Present   Feeling of Stress : Not at all  Social Connections: Unknown   Frequency of Communication with Friends and Family: Not on file   Frequency of Social Gatherings with Friends and Family: Not on file   Attends Religious Services: Never   Marine scientist or Organizations: No   Attends Archivist Meetings: Never   Marital Status: Divorced  Human resources officer Violence: Not At Risk   Fear of Current or Ex-Partner: No   Emotionally Abused: No   Physically Abused: No   Sexually Abused: No   Current Meds  Medication Sig   acetaminophen (TYLENOL) 500 MG tablet Take 1,000 mg every 8 (eight) hours as needed by mouth for mild pain.   albuterol (PROVENTIL) (2.5 MG/3ML) 0.083% nebulizer solution Take 3 mLs (2.5 mg total) by nebulization every 6 (six) hours as needed for wheezing or shortness of breath.   albuterol (VENTOLIN HFA) 108 (90 Base) MCG/ACT inhaler Inhale 1-2 puffs into the lungs every 6 (six) hours as needed for wheezing or shortness of breath.   benzonatate (TESSALON) 200 MG capsule Take 1 capsule (200 mg total) by mouth 3 (three) times daily as needed for cough.   Calcium Carbonate-Vitamin D 600-400 MG-UNIT tablet Take by mouth.   Cholecalciferol (VITAMIN D) 2000 units tablet Take 2,000 Units by  mouth daily.   donepezil (ARICEPT) 5 MG tablet Take 5 mg by mouth at bedtime.   ELIQUIS 5 MG TABS tablet TAKE 1 TABLET BY MOUTH TWICE A DAY   flecainide (TAMBOCOR) 50 MG tablet Take 1 tablet (50 mg total) by mouth 2 (two) times daily.   Fluticasone-Umeclidin-Vilant (TRELEGY ELLIPTA) 100-62.5-25 MCG/ACT AEPB Inhale 1 puff into the lungs daily.   Fluticasone-Umeclidin-Vilant (TRELEGY ELLIPTA) 100-62.5-25 MCG/ACT AEPB Inhale 100 mcg into the lungs daily.   hydrocortisone 2.5 % cream Apply topically 2 (two)  times daily. Prn right thigh   ketoconazole (NIZORAL) 2 % cream Apply 1 application. topically daily.   levocetirizine (XYZAL) 5 MG tablet Take 0.5-1 tablets (2.5-5 mg total) by mouth at bedtime as needed for allergies.   memantine (NAMENDA) 5 MG tablet Take 5 mg by mouth 2 (two) times daily.   methocarbamol (ROBAXIN) 500 MG tablet Take 500 mg by mouth 2 (two) times daily.   Multiple Vitamins-Minerals (MULTIVITAMIN WITH MINERALS) tablet Take 1 tablet by mouth daily.   nitroGLYCERIN (NITROSTAT) 0.4 MG SL tablet Place 0.4 mg under the tongue every 5 (five) minutes as needed for chest pain.   Olopatadine HCl (PATADAY) 0.7 % SOLN Apply 1 drop to eye daily.   propranolol ER (INDERAL LA) 60 MG 24 hr capsule Take 1 capsule (60 mg total) by mouth daily.   sodium chloride (OCEAN) 0.65 % SOLN nasal spray Place 1-2 sprays into both nostrils daily as needed for congestion.   topiramate (TOPAMAX) 50 MG tablet Take 100 mg by mouth 2 (two) times daily.   [DISCONTINUED] diltiazem (CARDIZEM CD) 120 MG 24 hr capsule Take 1 capsule (120 mg total) by mouth daily.   [DISCONTINUED] ipratropium (ATROVENT) 0.06 % nasal spray Place 2 sprays into both nostrils 3 (three) times daily.   [DISCONTINUED] LORazepam (ATIVAN) 0.5 MG tablet Take 0.5-1 tablets (0.25-0.5 mg total) by mouth daily as needed for anxiety or sleep.   [DISCONTINUED] losartan (COZAAR) 50 MG tablet Take 50 mg by mouth daily.   [DISCONTINUED] telmisartan  (MICARDIS) 40 MG tablet Take 60 mg (1 1/2/ pills) nightly   [DISCONTINUED] tiZANidine (ZANAFLEX) 2 MG tablet Take 1-2 tablets (2-4 mg total) by mouth at bedtime as needed for muscle spasms.   [DISCONTINUED] zolpidem (AMBIEN) 5 MG tablet TAKE 1 TABLET(5 MG) BY MOUTH AT BEDTIME AS NEEDED FOR SLEEP   Allergies  Allergen Reactions   Pravastatin     Diarrhea/constipation per pt     Fish Allergy Nausea And Vomiting, Swelling and Rash   Recent Results (from the past 2160 hour(s))  SARS CORONAVIRUS 2 (TAT 6-24 HRS) Nasopharyngeal Nasopharyngeal Swab     Status: None   Collection Time: 05/22/21  9:40 AM   Specimen: Nasopharyngeal Swab  Result Value Ref Range   SARS Coronavirus 2 NEGATIVE NEGATIVE    Comment: (NOTE) SARS-CoV-2 target nucleic acids are NOT DETECTED.  The SARS-CoV-2 RNA is generally detectable in upper and lower respiratory specimens during the acute phase of infection. Negative results do not preclude SARS-CoV-2 infection, do not rule out co-infections with other pathogens, and should not be used as the sole basis for treatment or other patient management decisions. Negative results must be combined with clinical observations, patient history, and epidemiological information. The expected result is Negative.  Fact Sheet for Patients: SugarRoll.be  Fact Sheet for Healthcare Providers: https://www.woods-mathews.com/  This test is not yet approved or cleared by the [place] FDA and  has been authorized for detection and/or diagnosis of SARS-CoV-2 by FDA under an Emergency Use Authorization (EUA). This EUA will remain  in effect (meaning this test can be used) for the duration of the COVID-19 declaration under Se ction 564(b)(1) of the Act, 21 U.S.C. section 360bbb-3(b)(1), unless the authorization is terminated or revoked sooner.  Performed at [facility], [place] [address], [place], [place] [address]   Surgical  pathology     Status: None   Collection Time: 06/05/21  9:56 AM  Result Value Ref Range   SURGICAL PATHOLOGY  SURGICAL PATHOLOGY CASE: [id number] PATIENT: Gina Frazier Surgical Pathology Report     Specimen Submitted: A. Duodenum; cbx B. Stomach; cbx C. Colon, random; cbx D. Colon polyp, ascending; cold snare  Clinical History: K21.9 -0 gastro-esophageal reflux disease, unspecified whether esophagitis present, r13.0 - dysphagia, unspecified type, R63.4 - weight loss, R19.7 - diarrhea, unspecified type, K62.5 - rectal bleeding.  Hiatal hernia; gastric polyps; diverticulosis; polyps    DIAGNOSIS: A. DUODENUM; COLD BIOPSY: - ENTERIC MUCOSA WITH PRESERVED VILLOUS ARCHITECTURE AND NO SIGNIFICANT HISTOPATHOLOGIC CHANGE. - NEGATIVE FOR FEATURES OF CELIAC, DYSPLASIA, AND MALIGNANCY.  B. STOMACH; COLD BIOPSY: - GASTRIC ANTRAL AND OXYNTIC MUCOSA WITH NO SIGNIFICANT HISTOPATHOLOGIC CHANGE. - NEGATIVE FOR H. PYLORI, DYSPLASIA, AND MALIGNANCY.  C. COLON, RANDOM; COLD BIOPSY: - BENIGN COLONIC MUCOSA WITH NO SIGNIFICANT HISTOPATHOLOGIC CHANGE. - NEGATIVE FOR ACTIVE MUCO SAL COLITIS AND FEATURES OF MICROSCOPIC COLITIS. - NEGATIVE FOR DYSPLASIA OR MALIGNANCY.  D. COLON POLYP, ASCENDING; COLD SNARE: - POLYPOID FRAGMENT OF BENIGN COLONIC MUCOSA WITH NO SIGNIFICANT HISTOPATHOLOGIC CHANGE. - NEGATIVE FOR DYSPLASIA AND MALIGNANCY.  Comment: Multiple additional deeper recut levels were examined.  GROSS DESCRIPTION: A. Labeled: Cbx duodenum rule out celiac Received: Formalin Collection time: 9:56 AM on 06/05/2021 Placed into formalin time: 9:56 AM on 06/05/2021 Tissue fragment(s): Multiple Size: Aggregate, 0.6 x 0.4 x 0.1 cm Description: Tan soft tissue fragments Entirely submitted in 1 cassette.  B. Labeled: Gastric cbx rule out H. pylori Received: Formalin Collection time: 9:59 AM on 06/05/2021 Placed into formalin time: 9:59 AM on 06/05/2021 Tissue fragment(s):  Multiple Size: Aggregate, 0.8 x 0.4 x 0.1 cm Description: Tan soft tissue fragments Entirely submitted in 1 cassette.  C. Labeled: Random colon cbx rule out microscopic col itis Received: Formalin Collection time: 10:18 AM on 06/05/2021 Placed into formalin time: 10:18 AM on 06/05/2021 Tissue fragment(s): Multiple Size: Aggregate, 0.9 x 0.5 x 0.1 cm Description: Tan soft tissue fragments Entirely submitted in 1 cassette.  D. Labeled: Cold snare ascending colon polyp Received: Formalin Collection time: 10:22 AM on 06/05/2021 Placed into formalin time: 10:22 AM on 06/05/2021 Tissue fragment(s): 1 Size: 0.3 x 0.2 x 0.1 cm Description: Tan soft tissue fragment Entirely submitted in 1 cassette.  CM 06/05/2021  Final Diagnosis performed by Allena Napoleon, MD.   Electronically signed 06/09/2021 1:27:29PM The electronic signature indicates that the named Attending Pathologist has evaluated the specimen Technical component performed at [facility], [address], [place], [place] [address] Lab: [phone] Dir: Rush Farmer, MD, MMM  Professional component performed at [facility], [facility], [place], [place], [place] Lab: [phone] Dir: Kathi Simpers, MD    Objective  Body mass index is 26.3 kg/m. Wt Readings from Last 3 Encounters:  08/14/21 143 lb 12.8 oz (65.2 kg)  08/13/21 145 lb 6.4 oz (66 kg)  07/30/21 137 lb (62.1 kg)   Temp Readings from Last 3 Encounters:  08/14/21 98.1 F (36.7 C) (Oral)  08/13/21 97.9 F (36.6 C) (Oral)  06/09/21 (!) 97.5 F (36.4 C) (Oral)   BP Readings from Last 3 Encounters:  08/14/21 122/70  08/13/21 124/80  06/09/21 118/80   Pulse Readings from Last 3 Encounters:  08/14/21 (!) 54  08/13/21 64  06/09/21 68    Physical Exam Vitals and nursing note reviewed.  Constitutional:      Appearance: Normal appearance. She is well-developed and well-groomed.  HENT:     Head: Normocephalic and atraumatic.   Eyes:     Conjunctiva/sclera: Conjunctivae normal.  Pupils: Pupils are equal, round, and reactive to light.  Cardiovascular:     Rate and Rhythm: Regular rhythm. Bradycardia present.     Heart sounds: Normal heart sounds. No murmur heard. Pulmonary:     Effort: Pulmonary effort is normal.     Breath sounds: Normal breath sounds.  Abdominal:     General: Abdomen is flat. Bowel sounds are normal.     Tenderness: There is no abdominal tenderness.  Musculoskeletal:        General: No tenderness.  Skin:    General: Skin is warm and dry.       Neurological:     General: No focal deficit present.     Mental Status: She is alert and oriented to person, place, and time. Mental status is at baseline.     Cranial Nerves: Cranial nerves 2-12 are intact.     Motor: Motor function is intact.     Coordination: Coordination is intact.     Gait: Gait is intact.  Psychiatric:        Attention and Perception: Attention and perception normal.        Mood and Affect: Mood and affect normal.        Speech: Speech normal.        Behavior: Behavior normal. Behavior is cooperative.        Thought Content: Thought content normal.        Cognition and Memory: Cognition and memory normal.        Judgment: Judgment normal.    Assessment  Plan  Hypertension, controlled- Plan: Comprehensive metabolic panel, CBC with Differential/Platelet  telmisartan 60 mg qd per cardiology   Iron deficiency check this history see labs care everywhere with brittle nails - Plan: IBC + Ferritin Rec mvt daily with iron and collagen I.e vital proteins + biotin   Tinea corporis - Plan: hydrocortisone 2.5 % cream, ketoconazole (NIZORAL) 2 % cream  Insomnia, unspecified type - Plan: zolpidem (AMBIEN) 5 MG tablet  Seasonal allergic rhinitis, unspecified trigger - Plan: ipratropium (ATROVENT) 0.06 % nasal spray  Watery eyes - Plan: Olopatadine HCl (PATADAY) 0.7 % SOLN   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 Manuella Ghazi seen 05/13/21 on aricept 5 mg and namenda 5 mg bid   [facility] Labs today  Had flu shot utd  had, pna vx x 2 consider repeat pna 23 when it has been 5 years 11/09/2019 but new guidelines not needed with age >66   zoster consider shingrix in future given Rx prev has not yet had   -prevnar 01/28/17, pna 23 11/09/14 confirmed, Tdap 01/06/13  covid vx had 4/4   Pap no h/o abnormal no GU blood has not had in a while though typically stop age 14.   Colonoscopy last 11/05/14 diverticulosis/hemorrhoids repeat in 10 years also consider cologuard  -est [facility] as of 01/10/21 pending   Colonoscopy 06/05/21 +polyp bxs negative and IH  mammo normal 04/25/19 -as of 01/10/21 order in needs to call and sch mammogram and dexa ordered   dexa osteoporosis 07/2017 on prolia had shot recently 04/28/18 and 11/01/18 f/u q6 months -07/28/17 +osteoporosis ordered   Never smoker  Skin- as of 01/10/21 Provider: Dr. Olivia Mackie McLean-Scocuzza-Internal Medicine

## 2021-08-14 NOTE — Patient Instructions (Addendum)
Multivitamin with iron (centrum, nature or garden of life)  Vital proteins collagen  [place] 4.3 18 [employer] reviews Medical supply store in [place], [place] Service options: In-store shopping  Delivery Address: [address] [place], [place] [address] Hours:  Thursday 9?AM-4:30?PM Friday 9?AM-4:30?PM Saturday Closed Sunday Closed Monday 9?AM-4:30?PM Tuesday 9?AM-4:30?PM Wednesday 9?AM-4:30?PM Suggest new hours    Phone: [phone]

## 2021-08-15 LAB — COMPREHENSIVE METABOLIC PANEL
ALT: 13 U/L (ref 0–35)
AST: 22 U/L (ref 0–37)
Albumin: 4.1 g/dL (ref 3.5–5.2)
Alkaline Phosphatase: 87 U/L (ref 39–117)
BUN: 22 mg/dL (ref 6–23)
CO2: 36 mEq/L — ABNORMAL HIGH (ref 19–32)
Calcium: 9.7 mg/dL (ref 8.4–10.5)
Chloride: 100 mEq/L (ref 96–112)
Creatinine, Ser: 0.95 mg/dL (ref 0.40–1.20)
GFR: 57.58 mL/min — ABNORMAL LOW (ref >60.00)
Glucose, Bld: 79 mg/dL (ref 70–99)
Potassium: 4.3 mEq/L (ref 3.5–5.1)
Sodium: 141 mEq/L (ref 135–145)
Total Bilirubin: 0.9 mg/dL (ref 0.2–1.2)
Total Protein: 6.9 g/dL (ref 6.0–8.3)

## 2021-08-15 LAB — IBC + FERRITIN
Ferritin: 27.8 ng/mL (ref 10.0–291.0)
Iron: 103 ug/dL (ref 42–145)
Saturation Ratios: 27.8 % (ref 20.0–50.0)
TIBC: 371 ug/dL (ref 250.0–450.0)
Transferrin: 265 mg/dL (ref 212.0–360.0)

## 2021-08-15 LAB — TSH: TSH: 4.41 u[IU]/mL (ref 0.35–5.50)

## 2021-09-19 ENCOUNTER — Other Ambulatory Visit: Payer: Self-pay | Admitting: Cardiovascular Disease

## 2021-09-30 ENCOUNTER — Telehealth: Payer: Self-pay | Admitting: Cardiovascular Disease

## 2021-09-30 NOTE — Telephone Encounter (Signed)
   Pre-operative Risk Assessment    Patient Name: Gina Frazier  DOB: [birth year] MRN: [id number]      Request for Surgical Clearance    Procedure:   Colonoscopy   Date of Surgery:  Clearance TBD                                 Surgeon:  not indicated  Surgeon's Group or Practice Name:  [facility] Phone number:  [phone] Fax number:  [phone]   Type of Clearance Requested:   - Pharmacy:  Hold Apixaban (Eliquis) instructions   Type of Anesthesia:  Not Indicated   Additional requests/questions:    Manfred Arch   09/30/2021, 2:17 PM

## 2021-09-30 NOTE — Telephone Encounter (Signed)
Patient with diagnosis of afib on Eliquis for anticoagulation.    Procedure: colonoscopy Date of procedure: TBD  CHA2DS2-VASc Score = 5  This indicates a 7.2% annual risk of stroke. The patient's score is based upon: CHF History: 0 HTN History: 1 Diabetes History: 0 Stroke History: 0 Vascular Disease History: 1 Age Score: 2 Gender Score: 1  CrCl 26m/min Platelet count 205K  Per office protocol, patient can hold Eliquis for 1-2 days prior to procedure.    **This guidance is not considered finalized until pre-operative APP has relayed final recommendations.**

## 2021-10-01 NOTE — Telephone Encounter (Signed)
   Patient Name: Gina Frazier  DOB: [birth year] MRN: [id number]  Primary Cardiologist: [name], MD  Clinical pharmacists have reviewed past medical history, labs, and current medications as part of pre-operative protocol coverage.  The following recommendations have been made:  Patient with diagnosis of afib on Eliquis for anticoagulation.     Procedure: colonoscopy Date of procedure: TBD   CHA2DS2-VASc Score = 5  This indicates a 7.2% annual risk of stroke. The patient's score is based upon: CHF History: 0 HTN History: 1 Diabetes History: 0 Stroke History: 0 Vascular Disease History: 1 Age Score: 2 Gender Score: 1   CrCl 46m/min Platelet count 205K   Per office protocol, patient can hold Eliquis for 1-2 days prior to procedure.    I will route this recommendation to the requesting party via Epic fax function and remove from pre-op pool.  Please call with questions.  ELenna Sciara NP 10/01/2021, 4:07 PM

## 2021-10-08 ENCOUNTER — Encounter: Payer: Self-pay | Admitting: Pulmonary Disease

## 2021-10-08 ENCOUNTER — Ambulatory Visit: Payer: Medicare Other | Admitting: Pulmonary Disease

## 2021-10-08 VITALS — BP 122/68 | HR 60 | Temp 98.0°F | Ht 62.0 in | Wt 147.0 lb

## 2021-10-08 DIAGNOSIS — R918 Other nonspecific abnormal finding of lung field: Secondary | ICD-10-CM | POA: Diagnosis not present

## 2021-10-08 DIAGNOSIS — R053 Chronic cough: Secondary | ICD-10-CM | POA: Diagnosis not present

## 2021-10-08 DIAGNOSIS — J4530 Mild persistent asthma, uncomplicated: Secondary | ICD-10-CM

## 2021-10-08 NOTE — Progress Notes (Signed)
Subjective:    Patient ID: Gina Frazier, female    DOB: August 26, 1945, 76 y.o.   MRN: [id number] Patient Care Team: McLean-Scocuzza, Nino Glow, MD as PCP - General (Internal Medicine) Minna Merritts, MD as PCP - Cardiology (Cardiology) Minna Merritts, MD (Cardiology)  Chief Complaint  Patient presents with   Follow-up    No current sx.     HPI Gina Frazier is a 76 year old lifelong never smoker who follows here for the issue of lung nodules, cough and shortness of breath. She was last seen here on 09 June 2021 for the details of that visit please refer to that note.  This is a scheduled visit.  She presents today with her daughter who adds to history.  She had pulmonary function testing performed 23 May 2021.  This showed well-preserved spirometric values.  The patient did have significant difficulty performing the test however the effort independent portions of the test did not show evidence of overt obstruction.  Patient does have small airways component. Diffusion capacity was normal.  No further assessment can be made to due to patient's inability to perform the test optimally. She does have mild to moderate dementia and has some difficulties with following directions.  Her cough has resolved and shortness of breath has improved on Trelegy.  Overall she feels that she is doing better. She has been scheduled for follow-up CT for her lung nodules.  Per the daughter she feels that she is doing well respiratory wise.   Review of Systems A 10 point review of systems was performed and it is as noted above otherwise negative.  Patient Active Problem List   Diagnosis Date Noted   Brittle nails 08/14/2021   Abnormal gait 04/15/2021   Mild intermittent asthma without complication 17/49/4496   Mild Alzheimer's dementia with anxiety ([place]) 03/26/2021   Cataract of both eyes 01/13/2021   Aortic atherosclerosis ([place]) 10/13/2019   Seasonal allergic rhinitis 06/29/2019   Hyperlipidemia 06/29/2019    Memory loss 02/16/2019   MDD (major depressive disorder), single episode, moderate ([place]) 01/18/2019   GAD (generalized anxiety disorder) 01/18/2019   Right knee pain 06/07/2018   Insomnia due to mental disorder 06/07/2018   Osteoporosis 07/29/2017   Orthostatic hypotension 07/05/2017   Bradycardia 07/05/2017   Essential tremor 03/31/2017   Hip pain 03/31/2017   Hoarseness of voice 03/31/2017   Hyperglycemia 03/31/2017   Status post revision of total knee replacement, left 02/11/2017   Chronic pain of left knee 02/08/2017   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   Weight gain 03/18/2016   Numbness 01/28/2016   Chronic bilateral low back pain 12/12/2015   Weakness    Weight loss    PAF (paroxysmal atrial fibrillation) (HCC)    Primary osteoarthritis of knee 05/02/2015   Special screening for malignant neoplasms, colon    Snoring 05/01/2014   Anxiety 12/30/2010   Preoperative cardiovascular examination 12/30/2010   Hypertension 07/21/2010   Chronic back pain 07/21/2010   Vitamin D deficiency 07/21/2010   SHORTNESS OF BREATH 05/01/2010   Essential hypertension 04/18/2010   OSTEOARTHROS UNSPEC GEN/LOC OTH SPEC SITES 04/18/2010   KNEE PAIN, LEFT 04/18/2010   Social History   Tobacco Use   Smoking status: Never   Smokeless tobacco: Never  Substance Use Topics   Alcohol use: No   Allergies  Allergen Reactions   Pravastatin     Diarrhea/constipation per pt     Fish Allergy Nausea And Vomiting, Swelling and Rash   Current Meds  Medication Sig  acetaminophen (TYLENOL) 500 MG tablet Take 1,000 mg every 8 (eight) hours as needed by mouth for mild pain.   albuterol (PROVENTIL) (2.5 MG/3ML) 0.083% nebulizer solution Take 3 mLs (2.5 mg total) by nebulization every 6 (six) hours as needed for wheezing or shortness of breath.   albuterol (VENTOLIN HFA) 108 (90 Base) MCG/ACT inhaler Inhale 1-2 puffs into the lungs every 6 (six) hours as needed for wheezing or shortness of breath.    benzonatate (TESSALON) 200 MG capsule Take 1 capsule (200 mg total) by mouth 3 (three) times daily as needed for cough.   Calcium Carbonate-Vitamin D 600-400 MG-UNIT tablet Take by mouth.   Cholecalciferol (VITAMIN D) 2000 units tablet Take 2,000 Units by mouth daily.   donepezil (ARICEPT) 5 MG tablet Take 5 mg by mouth at bedtime.   Fluticasone-Umeclidin-Vilant (TRELEGY ELLIPTA) 100-62.5-25 MCG/ACT AEPB Inhale 1 puff into the lungs daily.   Fluticasone-Umeclidin-Vilant (TRELEGY ELLIPTA) 100-62.5-25 MCG/ACT AEPB Inhale 100 mcg into the lungs daily. (Patient not taking: Reported on 12/16/2021)   hydrocortisone 2.5 % cream Apply topically 2 (two) times daily. Prn right thigh   ipratropium (ATROVENT) 0.06 % nasal spray Place 2 sprays into both nostrils 3 (three) times daily.   ketoconazole (NIZORAL) 2 % cream Apply 1 application. topically daily.   levocetirizine (XYZAL) 5 MG tablet Take 0.5-1 tablets (2.5-5 mg total) by mouth at bedtime as needed for allergies.   memantine (NAMENDA) 5 MG tablet Take 5 mg by mouth 2 (two) times daily.   methocarbamol (ROBAXIN) 500 MG tablet Take 500 mg by mouth 2 (two) times daily.   Multiple Vitamins-Minerals (MULTIVITAMIN WITH MINERALS) tablet Take 1 tablet by mouth daily.   nitroGLYCERIN (NITROSTAT) 0.4 MG SL tablet Place 0.4 mg under the tongue every 5 (five) minutes as needed for chest pain.   Olopatadine HCl (PATADAY) 0.7 % SOLN Apply 1 drop to eye daily.   sodium chloride (OCEAN) 0.65 % SOLN nasal spray Place 1-2 sprays into both nostrils daily as needed for congestion.   [DISCONTINUED] ELIQUIS 5 MG TABS tablet TAKE 1 TABLET BY MOUTH TWICE A DAY   [DISCONTINUED] flecainide (TAMBOCOR) 50 MG tablet Take 1 tablet (50 mg total) by mouth 2 (two) times daily.   [DISCONTINUED] propranolol ER (INDERAL LA) 60 MG 24 hr capsule Take 1 capsule (60 mg total) by mouth daily.   [DISCONTINUED] telmisartan (MICARDIS) 40 MG tablet TAKE 60 MG(1 AND 1/2 TABLET) NIGHTLY    [DISCONTINUED] zolpidem (AMBIEN) 5 MG tablet TAKE 1 TABLET(5 MG) BY MOUTH AT BEDTIME AS NEEDED FOR SLEEP   Immunization History  Administered Date(s) Administered   Fluad Quad(high Dose 65+) 11/10/2018, 12/11/2019, 12/05/2020, 11/21/2021   Hepatitis B 03/16/2014, 10/11/2014, 11/25/2014, 07/29/2015   Hepatitis B, adult 03/16/2014, 10/11/2014, 11/25/2014, 07/29/2015   Influenza Split 01/05/2011, 12/23/2011, 01/06/2013   Influenza Whole 04/19/2010   Influenza, High Dose Seasonal PF 12/17/2015, 02/12/2017, 12/03/2017   Moderna Covid-19 Vaccine Bivalent Booster 29yr & up 01/23/2021   Moderna SARS-COV2 Booster Vaccination 02/15/2020   Moderna Sars-Covid-2 Vaccination 04/28/2019, 05/27/2019   Pneumococcal Conjugate-13 09/29/2013, 01/28/2017   Pneumococcal Polysaccharide-23 07/21/2010   Td 04/19/2010   Tdap 01/06/2013   Zoster, Live 07/31/2013       Objective:   Physical Exam BP 122/68 (BP Location: Left Arm, Cuff Size: Normal)   Pulse 60   Temp 98 F (36.7 C) (Temporal)   Ht '5\' 2"'$  (1.575 m)   Wt 147 lb (66.7 kg)   SpO2 98%   BMI 26.89 kg/m  GENERAL: Well-developed well-nourished woman, fully ambulatory, no acute distress.  No conversational dyspnea. HEAD: Normocephalic, atraumatic.  EYES: Pupils equal, round, reactive to light.  No scleral icterus.  MOUTH: Dentition intact, oral mucosa moist.  No thrush. NECK: Supple. No thyromegaly. Trachea midline. No JVD.  No adenopathy. PULMONARY: Good air entry bilaterally.  No adventitious sounds. CARDIOVASCULAR: S1 and S2. Regular rate and rhythm.  ABDOMEN: Benign. MUSCULOSKELETAL: No joint deformity, no clubbing, no edema.  No Raynaud's noted today. NEUROLOGIC: Mild resting tremor (familial) no other focality. SKIN: Intact,warm,dry.  On limited exam, no rashes.   PSYCH: Mildly befuddled.     Assessment & Plan:     ICD-10-CM   1. Mild persistent asthma without complication  O67.12    Continue Trelegy 100,1 puff daily Continue as  needed albuterol Well compensated    2. Multiple lung nodules  R91.8    Follow-up with CT in September 2023 Will call patient with results    3. Chronic cough  R05.3    Markedly improved with Trelegy Suspect cough variant with her asthma     Patient appears well compensated.  We will see her in follow-up in 6 months time she is to contact us prior to that time should any new difficulties arise.  Renold Don, MD Advanced Bronchoscopy PCCM [facility]    *This note was dictated using voice recognition software/Dragon.  Despite best efforts to proofread, errors can occur which can change the meaning. Any transcriptional errors that result from this process are unintentional and may not be fully corrected at the time of dictation.

## 2021-10-08 NOTE — Patient Instructions (Signed)
Your lungs sounded really good today.  Continue Trelegy 1 puff daily.  Make sure you rinse your mouth well after you use it.  Keep the appointment for your CT in September.  We will call you with the results of the CT once these are known.  We will see you in follow-up in 6 months time call sooner should any new problems arise.

## 2021-10-09 ENCOUNTER — Other Ambulatory Visit: Payer: Self-pay | Admitting: Cardiovascular Disease

## 2021-10-09 DIAGNOSIS — I480 Paroxysmal atrial fibrillation: Secondary | ICD-10-CM

## 2021-10-09 NOTE — Telephone Encounter (Signed)
Prescription refill request for Eliquis received. Indication:Afib Last office visit:8/22 Scr:0.9 Age: 76 Weight:66.7 kg  Prescription refilled

## 2021-10-09 NOTE — Telephone Encounter (Signed)
Refill Request.  

## 2021-10-10 DIAGNOSIS — G4721 Circadian rhythm sleep disorder, delayed sleep phase type: Secondary | ICD-10-CM | POA: Diagnosis not present

## 2021-10-10 DIAGNOSIS — G250 Essential tremor: Secondary | ICD-10-CM | POA: Diagnosis not present

## 2021-10-10 DIAGNOSIS — M549 Dorsalgia, unspecified: Secondary | ICD-10-CM | POA: Diagnosis not present

## 2021-10-10 DIAGNOSIS — G309 Alzheimer's disease, unspecified: Secondary | ICD-10-CM | POA: Diagnosis not present

## 2021-10-15 ENCOUNTER — Ambulatory Visit: Payer: Medicare Other | Admitting: Pulmonary Disease

## 2021-10-16 ENCOUNTER — Other Ambulatory Visit: Payer: Self-pay | Admitting: Cardiovascular Disease

## 2021-10-16 DIAGNOSIS — I4891 Unspecified atrial fibrillation: Secondary | ICD-10-CM

## 2021-10-16 DIAGNOSIS — I10 Essential (primary) hypertension: Secondary | ICD-10-CM

## 2021-10-16 DIAGNOSIS — I480 Paroxysmal atrial fibrillation: Secondary | ICD-10-CM

## 2021-10-16 DIAGNOSIS — G250 Essential tremor: Secondary | ICD-10-CM

## 2021-11-13 ENCOUNTER — Other Ambulatory Visit: Payer: Self-pay | Admitting: Cardiovascular Disease

## 2021-11-13 NOTE — Telephone Encounter (Signed)
Please contact pt for future appointment. Pt overdue for future f/u. Pt needing refills.

## 2021-11-18 ENCOUNTER — Ambulatory Visit
Admission: RE | Admit: 2021-11-18 | Discharge: 2021-11-18 | Disposition: A | Discharge status: Home / Self Care | Payer: Medicare Other | Source: Ambulatory Visit | Attending: Internal Medicine | Admitting: Internal Medicine

## 2021-11-18 DIAGNOSIS — R918 Other nonspecific abnormal finding of lung field: Secondary | ICD-10-CM | POA: Diagnosis not present

## 2021-11-18 DIAGNOSIS — J9811 Atelectasis: Secondary | ICD-10-CM | POA: Diagnosis not present

## 2021-11-20 ENCOUNTER — Other Ambulatory Visit: Payer: Self-pay

## 2021-11-20 DIAGNOSIS — R918 Other nonspecific abnormal finding of lung field: Secondary | ICD-10-CM

## 2021-11-21 ENCOUNTER — Ambulatory Visit (INDEPENDENT_AMBULATORY_CARE_PROVIDER_SITE_OTHER): Payer: Medicare Other | Admitting: *Deleted

## 2021-11-21 ENCOUNTER — Telehealth: Payer: Self-pay | Admitting: *Deleted

## 2021-11-21 DIAGNOSIS — Z23 Encounter for immunization: Secondary | ICD-10-CM

## 2021-11-21 NOTE — Telephone Encounter (Signed)
Pt came in today for Flu vaccine & asked that we send a Rx to [facility] in [place] for the Shingles vaccine (Shingrix?). Pt needs to get it at the pharmacy based on her age.  Thanks

## 2021-11-23 ENCOUNTER — Other Ambulatory Visit: Payer: Self-pay | Admitting: Internal Medicine

## 2021-11-23 DIAGNOSIS — Z23 Encounter for immunization: Secondary | ICD-10-CM

## 2021-11-23 MED ORDER — SHINGRIX 50 MCG/0.5ML IM SUSR: 0.5000 mL | Freq: Once | INTRAMUSCULAR | 1 refills | Status: AC

## 2021-11-23 NOTE — Telephone Encounter (Signed)
Sent Rx for shingles to [facility] in [place] though she can go to [place] or where she wants to go to get this

## 2021-11-30 ENCOUNTER — Other Ambulatory Visit: Payer: Self-pay | Admitting: Cardiovascular Disease

## 2021-12-04 DIAGNOSIS — H2513 Age-related nuclear cataract, bilateral: Secondary | ICD-10-CM | POA: Diagnosis not present

## 2021-12-04 DIAGNOSIS — H04123 Dry eye syndrome of bilateral lacrimal glands: Secondary | ICD-10-CM | POA: Diagnosis not present

## 2021-12-04 DIAGNOSIS — H353131 Nonexudative age-related macular degeneration, bilateral, early dry stage: Secondary | ICD-10-CM | POA: Diagnosis not present

## 2021-12-15 NOTE — Progress Notes (Signed)
Date:  12/16/2021   ID:  Gina Frazier, DOB 18-Aug-1945, MRN [id number]  Patient Location:  [place] [address]   Provider location:   [facility], [place] office  PCP:  McLean-Scocuzza, Nino Glow, MD  Cardiologist:  [name], MD   Chief Complaint  Patient presents with   Follow-up    Patient c/o chest pain at times. Medications reviewed by the patient verbally.     History of Present Illness:    Gina Frazier is a 76 y.o. female with PMH of  Hypertension Evaluated in 2012 for acute hypertension in the dentist chair,  hospital with atrial fibrillation on 04/28/2014, started on Cardizem, Lopressor converting to normal sinus rhythm (Prior episode 6 months prior) started on anticoagulation with eliquis 5 mg by mouth twice a day,   flecainide 50 mg twice a day, continued on beta blocker. She presents today for follow-up of her atrial fibrillation, hypertension, left chest pain  Last seen by myself in clinic August 2022  Presents today with her sister Poor balance, no exercise Has done some home health They do not provide her with a walker that fit her  Does not have cane or walker Does not go outside much as she is afraid she might fall  BP elevated today 638 systolic, 756 on recheck Home BP 130-140s  No significant shortness of breath or chest pain on exertion  Finding Eliquis and inhaler very expensive Reports mother passed 2022, in [place]  EKG personally reviewed by myself on todays visit Normal sinus rhythm rate 59 bpm no significant ST-T wave changes  CT ABD Aortic atherosclerosis. mild to moderate diffuse aortic atherosclerosis descending aorta  Echocardiogram from the hospital shows ejection fraction 60-65%, otherwise normal study    Past Medical History:  Diagnosis Date   Allergy    Anxiety    Asthma    Bell's palsy    left   Chronic back pain    Family history of adverse reaction to anesthesia     pts daughter had severe vomiting 2014    Hip joint pain    left   HLD (hyperlipidemia)    Hypertension    Neuropathy    Osteoarthritis    PAF (paroxysmal atrial fibrillation) ([place])    a. initially diagnosed 04/2014; b. on Eliquis; c. CHADS2VASc = 3   Palpitations    Wears contact lenses    Wears dentures    partial upper   Past Surgical History:  Procedure Laterality Date   CHOLECYSTECTOMY     COLONOSCOPY WITH PROPOFOL N/A 11/05/2014   Procedure: COLONOSCOPY WITH PROPOFOL;  Surgeon: Lucilla Lame, MD;  Location: [place];  Service: Endoscopy;  Laterality: N/A;   COLONOSCOPY WITH PROPOFOL N/A 06/05/2021   Procedure: COLONOSCOPY WITH PROPOFOL;  Surgeon: [name], DO;  Location: [facility] ENDOSCOPY;  Service: Gastroenterology;  Laterality: N/A;  ELIQUIS   ESOPHAGOGASTRODUODENOSCOPY (EGD) WITH PROPOFOL N/A 06/05/2021   Procedure: ESOPHAGOGASTRODUODENOSCOPY (EGD) WITH PROPOFOL;  Surgeon: [name], DO;  Location: [facility] ENDOSCOPY;  Service: Gastroenterology;  Laterality: N/A;   JOINT REPLACEMENT     left knee x 2 04/2024 and 01/2017 Dr. 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   Procedure: TOTAL KNEE ARTHROPLASTY;  Surgeon:  Hessie Knows, MD;  Location: [facility];  Service: Orthopedics;  Laterality: Left;   TOTAL KNEE REVISION Left 02/11/2017   Procedure: TOTAL KNEE REVISION, POLYETHELENE EXCHANGE;  Surgeon: Hessie Knows, MD;  Location: [facility];  Service: Orthopedics;  Laterality: Left;     Current Meds  Medication Sig   acetaminophen (TYLENOL) 500 MG tablet Take 1,000 mg every 8 (eight) hours as needed by mouth for mild pain.   albuterol (PROVENTIL) (2.5 MG/3ML) 0.083% nebulizer solution Take 3 mLs (2.5 mg total) by nebulization every 6  (six) hours as needed for wheezing or shortness of breath.   albuterol (VENTOLIN HFA) 108 (90 Base) MCG/ACT inhaler Inhale 1-2 puffs into the lungs every 6 (six) hours as needed for wheezing or shortness of breath.   Baclofen 5 MG TABS Take by mouth.   benzonatate (TESSALON) 200 MG capsule Take 1 capsule (200 mg total) by mouth 3 (three) times daily as needed for cough.   Calcium Carbonate-Vitamin D 600-400 MG-UNIT tablet Take by mouth.   Cholecalciferol (VITAMIN D) 2000 units tablet Take 2,000 Units by mouth daily.   donepezil (ARICEPT) 5 MG tablet Take 5 mg by mouth at bedtime.   ELIQUIS 5 MG TABS tablet TAKE 1 TABLET BY MOUTH TWICE DAILY   flecainide (TAMBOCOR) 50 MG tablet TAKE 1 TABLET(50 MG) BY MOUTH TWICE DAILY   Fluticasone-Umeclidin-Vilant (TRELEGY ELLIPTA) 100-62.5-25 MCG/ACT AEPB Inhale 1 puff into the lungs daily.   hydrocortisone 2.5 % cream Apply topically 2 (two) times daily. Prn right thigh   ipratropium (ATROVENT) 0.06 % nasal spray Place 2 sprays into both nostrils 3 (three) times daily.   ketoconazole (NIZORAL) 2 % cream Apply 1 application. topically daily.   levocetirizine (XYZAL) 5 MG tablet Take 0.5-1 tablets (2.5-5 mg total) by mouth at bedtime as needed for allergies.   memantine (NAMENDA) 5 MG tablet Take 5 mg by mouth 2 (two) times daily.   methocarbamol (ROBAXIN) 500 MG tablet Take 500 mg by mouth 2 (two) times daily.   Multiple Vitamins-Minerals (MULTIVITAMIN WITH MINERALS) tablet Take 1 tablet by mouth daily.   nitroGLYCERIN (NITROSTAT) 0.4 MG SL tablet Place 0.4 mg under the tongue every 5 (five) minutes as needed for chest pain.   Olopatadine HCl (PATADAY) 0.7 % SOLN Apply 1 drop to eye daily.   propranolol ER (INDERAL LA) 60 MG 24 hr capsule TAKE 1 CAPSULE(60 MG) BY MOUTH DAILY   sodium chloride (OCEAN) 0.65 % SOLN nasal spray Place 1-2 sprays into both nostrils daily as needed for congestion.   telmisartan (MICARDIS) 40 MG tablet TAKE 60 MG(1 AND 1/2 TABLET)  NIGHTLY   zolpidem (AMBIEN) 5 MG tablet TAKE 1 TABLET(5 MG) BY MOUTH AT BEDTIME AS NEEDED FOR SLEEP     Allergies:   Pravastatin and Fish allergy   Social History   Tobacco Use   Smoking status: Never   Smokeless tobacco: Never  Vaping Use   Vaping Use: Never used  Substance Use Topics   Alcohol use: No   Drug use: No     Family Hx: The patient's family history includes Arthritis in her daughter; Bladder Cancer in her sister; Breast cancer in an other family member; Breast cancer (age of onset: 55) in her sister; Cancer in an other family member; Depression in her daughter, sister, and another family member; Diabetes in her mother and sister; GER disease in her daughter; Heart disease in her brother, father, mother, and another family member; Hypertension in her brother, mother, and sister;  Hypothyroidism in her daughter; Lupus in her daughter; Mental illness in her mother.  ROS:   Please see the history of present illness.    Review of Systems  Constitutional: Negative.   HENT: Negative.    Respiratory: Negative.    Cardiovascular: Negative.   Gastrointestinal: Negative.   Musculoskeletal: Negative.   Neurological: Negative.   Psychiatric/Behavioral: Negative.    All other systems reviewed and are negative.    Labs/Other Tests and Data Reviewed:    Recent Labs: 08/14/2021: ALT 13; BUN 22; Creatinine, Ser 0.95; Hemoglobin 12.0; Platelets 205.0; Potassium 4.3; Sodium 141; TSH 4.41   Recent Lipid Panel Lab Results  Component Value Date/Time   CHOL 199 01/10/2021 02:30 PM   TRIG 54 01/10/2021 02:30 PM   HDL 79 01/10/2021 02:30 PM   CHOLHDL 2.5 01/10/2021 02:30 PM   LDLCALC 106 (H) 01/10/2021 02:30 PM    Wt Readings from Last 3 Encounters:  12/16/21 150 lb 4 oz (68.2 kg)  10/08/21 147 lb (66.7 kg)  08/14/21 143 lb 12.8 oz (65.2 kg)     Exam:    Vital Signs:  BP (!) 180/80 (BP Location: Left Arm, Patient Position: Sitting, Cuff Size: Normal)   Pulse (!) 59   Ht 5'  2" (1.575 m)   Wt 150 lb 4 oz (68.2 kg)   BMI 27.48 kg/m   Constitutional:  oriented to person, place, and time. No distress. thin HENT:  Head: Grossly normal Eyes:  no discharge. No scleral icterus.  Neck: No JVD, no carotid bruits  Cardiovascular: Regular rate and rhythm, no murmurs appreciated Pulmonary/Chest: Clear to auscultation bilaterally, no wheezes or rails Abdominal: Soft.  no distension.  no tenderness.  Musculoskeletal: Normal range of motion Neurological:  normal muscle tone. Coordination normal. No atrophy Skin: Skin warm and dry Psychiatric: normal affect, pleasant   ASSESSMENT & PLAN:    PAF (paroxysmal atrial fibrillation) (HCC) No changes to her medications, no recurrent arrhythmia Maintaining normal sinus rhythm Continue current medications Patient assistance for Eliquis  Weight loss, unintentional In 2022, was in [place], taking care of her mother missing meals, not taking care of herself Weight has stabilized  Essential hypertension Markedly elevated blood pressure on check today Recommend she start amlodipine 5 the first week then up to 10 for systolic pressure over 734 Continue other medications Previously on diltiazem ER 120 daily, unclear where this was held  Chest pain, unspecified type Currently with no symptoms of angina. No further workup at this time. Continue current medication regimen.  Gait instability recommend regular walking program Recommend she buy a walker  Aortic atherosclerosis Appears that her statin and Zetia have been held, details unclear   Total encounter time more than 30 minutes  Greater than 50% was spent in counseling and coordination of care with the patient    Signed, [name], MD  12/16/2021 2:14 PM    [facility] [id number], [place], [place] [id number]

## 2021-12-16 ENCOUNTER — Encounter: Payer: Self-pay | Admitting: Cardiovascular Disease

## 2021-12-16 ENCOUNTER — Ambulatory Visit: Payer: Medicare Other | Attending: Cardiovascular Disease | Admitting: Cardiovascular Disease

## 2021-12-16 VITALS — BP 170/90 | HR 59 | Ht 62.0 in | Wt 150.2 lb

## 2021-12-16 DIAGNOSIS — I700 Atherosclerosis of aorta: Secondary | ICD-10-CM

## 2021-12-16 DIAGNOSIS — I4891 Unspecified atrial fibrillation: Secondary | ICD-10-CM

## 2021-12-16 DIAGNOSIS — I10 Essential (primary) hypertension: Secondary | ICD-10-CM

## 2021-12-16 DIAGNOSIS — E785 Hyperlipidemia, unspecified: Secondary | ICD-10-CM | POA: Diagnosis not present

## 2021-12-16 DIAGNOSIS — R079 Chest pain, unspecified: Secondary | ICD-10-CM | POA: Diagnosis not present

## 2021-12-16 DIAGNOSIS — I480 Paroxysmal atrial fibrillation: Secondary | ICD-10-CM

## 2021-12-16 DIAGNOSIS — G250 Essential tremor: Secondary | ICD-10-CM

## 2021-12-16 MED ORDER — PROPRANOLOL HCL ER 60 MG PO CP24: ORAL_CAPSULE | ORAL | 3 refills | Status: DC

## 2021-12-16 MED ORDER — AMLODIPINE BESYLATE 10 MG PO TABS: ORAL_TABLET | ORAL | 3 refills | Status: DC

## 2021-12-16 MED ORDER — APIXABAN 5 MG PO TABS: 5.0000 mg | ORAL_TABLET | Freq: Two times a day (BID) | ORAL | 12 refills | Status: DC

## 2021-12-16 MED ORDER — FLECAINIDE ACETATE 50 MG PO TABS: ORAL_TABLET | ORAL | 3 refills | Status: DC

## 2021-12-16 MED ORDER — TELMISARTAN 40 MG PO TABS: ORAL_TABLET | ORAL | 3 refills | Status: DC

## 2021-12-16 NOTE — Patient Instructions (Addendum)
Medication Instructions:  Please add amlodipine 1/2 pill daily (5 mg)  Monitor blood pressure After one week if blood pressure still >140, Increase up to 10 mg daily (whole pill)   Blood Pressure Record Sheet To take your blood pressure, you will need a blood pressure machine. You can buy a blood pressure machine (blood pressure monitor) at your clinic, drug store, or online. When choosing one, consider: An automatic monitor that has an arm cuff. A cuff that wraps snugly around your upper arm. You should be able to fit only one finger between your arm and the cuff. A device that stores blood pressure reading results. Do not choose a monitor that measures your blood pressure from your wrist or finger. Follow your health care provider's instructions for how to take your blood pressure. To use this form: Get one reading in the morning (a.m.) 1-2 hours after you take any medicines. Get one reading in the evening (p.m.) before supper.    Make a follow-up appointment with your health care provider to discuss the results. Blood pressure log Date: _______________________  a.m. _____________________(1st reading) HR___________            p.m. _____________________(2nd reading) HR__________  Date: _______________________  a.m. _____________________(1st reading) HR___________            p.m. _____________________(2nd reading) HR__________  Date: _______________________  a.m. _____________________(1st reading) HR___________            p.m. _____________________(2nd reading) HR__________  Date: _______________________  a.m. _____________________(1st reading) HR___________            p.m. _____________________(2nd reading) HR__________  Date: _______________________  a.m. _____________________(1st reading) HR___________            p.m. _____________________(2nd reading) HR__________  Date: _______________________  a.m. _____________________(1st reading) HR___________             p.m. _____________________(2nd reading) HR__________  Date: _______________________  a.m. _____________________(1st reading) HR___________            p.m. _____________________(2nd reading) HR__________   This information is not intended to replace advice given to you by your health care provider. Make sure you discuss any questions you have with your health care provider. Document Revised: 06/21/2019 Document Reviewed: 06/21/2019 Elsevier Patient Education  2021 [employer].   If you need a refill on your cardiac medications before your next appointment, please call your pharmacy.   Lab work: No new labs needed  Testing/Procedures: No new testing needed  Follow-Up: At [facility], you and your health needs are our priority.  As part of our continuing mission to provide you with exceptional heart care, we have created designated Provider Care Teams.  These Care Teams include your primary Cardiologist (physician) and Advanced Practice Providers (APPs -  Physician Assistants and Nurse Practitioners) who all work together to provide you with the care you need, when you need it.  You will need a follow up appointment in 6 months  Providers on your designated Care Team:   [name], NP [name], PA-C Cadence Kathlen Mody, [place]  COVID-19 Vaccine Information can be found at: ShippingScam.co.uk For questions related to vaccine distribution or appointments, please email [email or web address] or call [phone].

## 2021-12-29 ENCOUNTER — Ambulatory Visit
Admission: RE | Admit: 2021-12-29 | Discharge: 2021-12-29 | Disposition: A | Discharge status: Home / Self Care | Payer: Medicare Other | Source: Ambulatory Visit | Attending: Internal Medicine | Admitting: Internal Medicine

## 2021-12-29 ENCOUNTER — Other Ambulatory Visit: Payer: Self-pay | Admitting: Family

## 2021-12-29 DIAGNOSIS — M810 Age-related osteoporosis without current pathological fracture: Secondary | ICD-10-CM | POA: Diagnosis not present

## 2021-12-29 DIAGNOSIS — Z1231 Encounter for screening mammogram for malignant neoplasm of breast: Secondary | ICD-10-CM | POA: Insufficient documentation

## 2021-12-29 MED ORDER — ALENDRONATE SODIUM 70 MG PO TABS: 70.0000 mg | ORAL_TABLET | ORAL | 11 refills | Status: DC

## 2022-01-19 ENCOUNTER — Telehealth: Payer: Self-pay | Admitting: Cardiovascular Disease

## 2022-01-19 NOTE — Telephone Encounter (Signed)
*  STAT* If patient is at the pharmacy, call can be transferred to refill team.   1. Which medications need to be refilled? (please list name of each medication and dose if known) New prescription for Telmisartan  2. Which pharmacy/location (including street and city if local pharmacy) is medication to be sent to? [facility]  [place]  3. Do they need a 30 day or 90 day supply? 30 days #60 and refills

## 2022-01-19 NOTE — Telephone Encounter (Signed)
telmisartan (MICARDIS) 40 MG tablet 120 tablet 3 12/16/2021    Sig: TAKE 60 MG(1 AND 1/2 TABLET) NIGHTLY   Sent to pharmacy as: telmisartan (MICARDIS) 40 MG tablet   E-Prescribing Status: Receipt confirmed by pharmacy (12/16/2021  2:55 PM EDT)    [id number] - [place], [place] AT [place]

## 2022-01-21 ENCOUNTER — Telehealth: Payer: Self-pay | Admitting: Cardiovascular Disease

## 2022-01-21 DIAGNOSIS — H353131 Nonexudative age-related macular degeneration, bilateral, early dry stage: Secondary | ICD-10-CM | POA: Diagnosis not present

## 2022-01-21 DIAGNOSIS — H2513 Age-related nuclear cataract, bilateral: Secondary | ICD-10-CM | POA: Diagnosis not present

## 2022-01-21 DIAGNOSIS — H527 Unspecified disorder of refraction: Secondary | ICD-10-CM | POA: Diagnosis not present

## 2022-01-21 DIAGNOSIS — H04123 Dry eye syndrome of bilateral lacrimal glands: Secondary | ICD-10-CM | POA: Diagnosis not present

## 2022-01-21 NOTE — Telephone Encounter (Signed)
Spoke w/ pt's daughter.  Pt has a block on her phone that will not accept blocked #s and they do not know how to remove it.  She reports that pt has been taking micardis 40 mg - 1.5 tabs nightly for a total of 60 mg. She went to p/u the rx from the pharmacy and was advised that her ins will no longer cover the 40 mg tab.   Advised her that I only see that micardis come in strengths of 20, 40 and 80 mg. Pt is completely out and took an amlodipine b/c her SBP was 170. Advised her that I will make Dr. Rockey Situ aware and call her back w/ his recommendation.

## 2022-01-21 NOTE — Telephone Encounter (Signed)
Pt c/o medication issue:  1. Name of Medication: telmisartan (MICARDIS) 40 MG tablet   2. How are you currently taking this medication (dosage and times per day)? TAKE 60 MG(1 AND 1/2 TABLET) NIGHTLY   3. Are you having a reaction (difficulty breathing--STAT)? no  4. What is your medication issue? Patient insurance wont pay for the half pill. Please advise

## 2022-01-21 NOTE — Telephone Encounter (Signed)
Left message for pt to call back  °

## 2022-01-21 NOTE — Telephone Encounter (Signed)
Attempted to contact pt, but went straight to vm. I am calling from a blocked #, so someone from the office may need to try.

## 2022-01-21 NOTE — Telephone Encounter (Signed)
Pt is returning call. Let her know patient would be called back by Estill Bamberg, RN.

## 2022-01-22 MED ORDER — TELMISARTAN 80 MG PO TABS: ORAL_TABLET | ORAL | 6 refills | Status: DC

## 2022-01-22 NOTE — Telephone Encounter (Signed)
Patient's daughter's called to check on status of her call. She can be reached at [phone]

## 2022-01-22 NOTE — Telephone Encounter (Signed)
Secure chat message received from Dr. Rockey Situ as below:  I would just write for telmisartan 80 mg daily  Original call states insurance will not cover the 1/2 pill. I assume insurance will cover 40, 80?

## 2022-01-22 NOTE — Telephone Encounter (Signed)
Secure chat sent to Dr. Rockey Situ to please review.

## 2022-01-22 NOTE — Telephone Encounter (Signed)
Attempted to call both daughter's #s, but they went straight to vm. Called pt's home # and spoke w/ her directly and advised that I am sending her in a new rx. She is appreciative and will have her daughters call w/ any questions once they get back from walking the dog.

## 2022-01-22 NOTE — Telephone Encounter (Signed)
Patient's other daughter called back upset because nothing has been handle. She is expecting and update to be give to the the other sister at [phone]. Patient is completely out of medication.  Please advise

## 2022-01-23 ENCOUNTER — Other Ambulatory Visit: Payer: Self-pay

## 2022-01-23 ENCOUNTER — Encounter: Payer: Self-pay | Admitting: Cardiovascular Disease

## 2022-01-23 MED ORDER — TELMISARTAN 40 MG PO TABS: ORAL_TABLET | ORAL | 6 refills | Status: DC

## 2022-01-23 NOTE — Telephone Encounter (Signed)
Attempted to call [employer], but they are asking for info I don't have access to from home.

## 2022-01-23 NOTE — Telephone Encounter (Signed)
Spoke with Timmothy Sours at [facility] who state the prescription has been sent in incorrectly. It was only sent for a Qty of #30 tablets instead of 45 which is needed to take 1/5 tablets daily. It was approved for this because 30 tablets was still within quantity limits but this is why she keeps running out early if she is taking 1/5 tablets daily. The pharmacy ran the claim three times: once on 11/5 and twice on 11/9 which caused it to be rejected. He states she has had approval for plan limitation exceeded in the past but it expired on 07-02-21. He reinitiated the request for Code 68 and sent it over for the clinical pharmacy team for review. He is requesting to get 40 mg tablets-1 1/2 tablets daily for a total of 60 mg. He states they should be responding with a determination fairly quickly.

## 2022-01-25 ENCOUNTER — Encounter: Payer: Self-pay | Admitting: Cardiovascular Disease

## 2022-01-26 NOTE — Telephone Encounter (Signed)
Spoke with patient who states she actually still has enough flecainide to last at least one week. Verified that she is only taking one 50 mg tablet twice daily and she agreed. Advised her that it is too soon to pick up the flecainide at this time but that she may pick up a new prescription on Saturday at which time her insurance will pay for it. Patient expressed verbal understanding. Also advised her that we are still waiting to hear back from her insurance company for their determination of telmisartan. She again expressed verbal understanding.

## 2022-01-26 NOTE — Telephone Encounter (Signed)
Spoke with [facility] pharmacist who states patient picked up the telmisartan prescription at 12:39 PM today.

## 2022-01-26 NOTE — Telephone Encounter (Signed)
Unable to speak with daughter-no DPR on file to speak with her.

## 2022-01-26 NOTE — Telephone Encounter (Signed)
Spoke with [place] at [facility]. He states the patient was given a 45 day supply of flecainide on 12/29/21 and the insurance will not pay again until 01/31/22 because it is too soon. He said "they are actually giving a generous leeway because a 45 day supply should last her at least until the end of November".

## 2022-02-09 DIAGNOSIS — H527 Unspecified disorder of refraction: Secondary | ICD-10-CM | POA: Diagnosis not present

## 2022-02-09 DIAGNOSIS — H2513 Age-related nuclear cataract, bilateral: Secondary | ICD-10-CM | POA: Diagnosis not present

## 2022-02-09 DIAGNOSIS — H2511 Age-related nuclear cataract, right eye: Secondary | ICD-10-CM | POA: Diagnosis not present

## 2022-02-09 DIAGNOSIS — H353133 Nonexudative age-related macular degeneration, bilateral, advanced atrophic without subfoveal involvement: Secondary | ICD-10-CM | POA: Diagnosis not present

## 2022-02-09 DIAGNOSIS — Z7901 Long term (current) use of anticoagulants: Secondary | ICD-10-CM | POA: Diagnosis not present

## 2022-02-09 DIAGNOSIS — H04123 Dry eye syndrome of bilateral lacrimal glands: Secondary | ICD-10-CM | POA: Diagnosis not present

## 2022-03-02 DIAGNOSIS — H2513 Age-related nuclear cataract, bilateral: Secondary | ICD-10-CM | POA: Diagnosis not present

## 2022-03-02 DIAGNOSIS — H2512 Age-related nuclear cataract, left eye: Secondary | ICD-10-CM | POA: Diagnosis not present

## 2022-03-13 ENCOUNTER — Other Ambulatory Visit: Payer: Self-pay | Admitting: Family

## 2022-03-13 ENCOUNTER — Telehealth: Payer: Self-pay

## 2022-03-13 DIAGNOSIS — G4700 Insomnia, unspecified: Secondary | ICD-10-CM

## 2022-03-13 MED ORDER — ZOLPIDEM TARTRATE 5 MG PO TABS: ORAL_TABLET | ORAL | 5 refills | Status: DC

## 2022-03-13 NOTE — Telephone Encounter (Signed)
Controlled substance rx refill request was received for the medication :   zolpidem (AMBIEN) 5 MG    Pt has a TOC appt with walsh on: 07-08-22   Pts LOV: 08-14-21

## 2022-03-17 ENCOUNTER — Other Ambulatory Visit: Payer: Self-pay

## 2022-03-17 DIAGNOSIS — G4700 Insomnia, unspecified: Secondary | ICD-10-CM

## 2022-03-17 NOTE — Telephone Encounter (Signed)
Rx request for controlled substance Ambien was received it has been pended and sent to a provider.   Pts LOV: 08-14-21  TOC with Volanda Napoleon: 07-08-22

## 2022-03-18 MED ORDER — ZOLPIDEM TARTRATE 5 MG PO TABS: ORAL_TABLET | ORAL | 5 refills | Status: DC

## 2022-04-11 ENCOUNTER — Encounter: Payer: Self-pay | Admitting: Pulmonary Disease

## 2022-04-23 DIAGNOSIS — Z0101 Encounter for examination of eyes and vision with abnormal findings: Secondary | ICD-10-CM | POA: Diagnosis not present

## 2022-04-26 ENCOUNTER — Other Ambulatory Visit: Payer: Self-pay | Admitting: Pulmonary Disease

## 2022-04-30 ENCOUNTER — Other Ambulatory Visit: Payer: Self-pay | Admitting: Cardiovascular Disease

## 2022-04-30 DIAGNOSIS — I480 Paroxysmal atrial fibrillation: Secondary | ICD-10-CM

## 2022-04-30 NOTE — Telephone Encounter (Signed)
Refill request

## 2022-04-30 NOTE — Telephone Encounter (Signed)
Prescription refill request for Eliquis received. Indication: PAF Last office visit: 12/16/21  Johnny Bridge MD Scr:  0.95 on 08/14/21 Age: 77 Weight: 68.2kg  Based on above findings Eliquis 25m twice daily is the appropriate dose.  Refill approved.

## 2022-05-05 ENCOUNTER — Other Ambulatory Visit: Payer: Self-pay | Admitting: Cardiovascular Disease

## 2022-05-10 ENCOUNTER — Telehealth: Payer: Self-pay | Admitting: Physician Assistant

## 2022-05-10 NOTE — Telephone Encounter (Signed)
Pt says BP has been running high, SBP > 200 yesterday.   She took 1/2 amlodipine yesterday and today, but SBP is still approx 160.  She has the amlodipine to take prn, but never does it. She does not check her BP every day, just sometimes.  Requested she take 1/2 amlodipine every morning and take the other half after lunch if SBP > 140.  Pt wrote down these instructions.   Will route this to Dr Rockey Situ for further management.   Rosaria Ferries, PA-C 05/10/2022 12:17 PM

## 2022-05-11 NOTE — Telephone Encounter (Signed)
Called the patient and made her aware of the recommendations. She will call back with readings after a few days.   Per Dr. Rockey Situ: She is taking her telmisartan and propranolol Prior instructions on office visit:  amlodipine 1/2 pill daily (5 mg) Monitor blood pressure After one week if blood pressure still >140, Increase up to 10 mg daily (whole pill) Has she been taking amlodipine? Need her to monitor blood pressure 2 hours after taking her morning medications and call us back with numbers for at least 1 week Blood pressure was elevated on prior clinic visit and we need her to closely monitor at home [facility]

## 2022-05-27 ENCOUNTER — Ambulatory Visit: Payer: Medicare Other | Admitting: Pulmonary Disease

## 2022-05-28 ENCOUNTER — Encounter: Payer: Medicare Other | Admitting: Family Medicine

## 2022-06-04 ENCOUNTER — Ambulatory Visit: Payer: Medicare Other | Admitting: Adult Health

## 2022-06-04 ENCOUNTER — Encounter: Payer: Self-pay | Admitting: Adult Health

## 2022-06-04 ENCOUNTER — Telehealth: Payer: Self-pay | Admitting: *Deleted

## 2022-06-04 ENCOUNTER — Telehealth: Payer: Self-pay

## 2022-06-04 VITALS — BP 130/60 | HR 60 | Temp 98.0°F | Ht 62.0 in | Wt 146.0 lb

## 2022-06-04 DIAGNOSIS — J4541 Moderate persistent asthma with (acute) exacerbation: Secondary | ICD-10-CM | POA: Diagnosis not present

## 2022-06-04 DIAGNOSIS — R413 Other amnesia: Secondary | ICD-10-CM

## 2022-06-04 DIAGNOSIS — R918 Other nonspecific abnormal finding of lung field: Secondary | ICD-10-CM | POA: Diagnosis not present

## 2022-06-04 DIAGNOSIS — F321 Major depressive disorder, single episode, moderate: Secondary | ICD-10-CM

## 2022-06-04 DIAGNOSIS — J45901 Unspecified asthma with (acute) exacerbation: Secondary | ICD-10-CM | POA: Insufficient documentation

## 2022-06-04 DIAGNOSIS — F411 Generalized anxiety disorder: Secondary | ICD-10-CM

## 2022-06-04 MED ORDER — PREDNISONE 10 MG PO TABS: ORAL_TABLET | ORAL | 0 refills | Status: DC

## 2022-06-04 MED ORDER — AMOXICILLIN-POT CLAVULANATE 875-125 MG PO TABS: 1.0000 | ORAL_TABLET | Freq: Two times a day (BID) | ORAL | 0 refills | Status: DC

## 2022-06-04 MED ORDER — TRELEGY ELLIPTA 100-62.5-25 MCG/ACT IN AEPB: 1.0000 | INHALATION_SPRAY | Freq: Every day | RESPIRATORY_TRACT | 0 refills | Status: DC

## 2022-06-04 NOTE — Assessment & Plan Note (Signed)
Acute asthmatic bronchitic exacerbation.  Will treat with Augmentin and prednisone taper. Continue with Trelegy inhaler.  Albuterol inhaler or nebulizers as needed.  Plan  Patient Instructions  Augmentin 875mg  Twice daily  for 7 days, take with food.  Prednisone taper over next week  Continue on Trelegy 1 puff daily , rinse after use.  Albuterol inhaler or neb As needed   CT chest to evaluate lung nodules Follow up with Dr. Patsey Berthold in 3 months and As needed   Please contact office for sooner follow up if symptoms do not improve or worsen or seek emergency care

## 2022-06-04 NOTE — Telephone Encounter (Signed)
   Telephone encounter was:  Unsuccessful.  06/04/2022 Name: Gina Frazier MRN: [id number] DOB: 02/20/1946  Unsuccessful outbound call made today to assist with:  Food Insecurity, Financial Difficulties related to financial strain , and housing   Outreach Attempt:  1st Attempt  A HIPAA compliant voice message was left requesting a return call.  Instructed patient to call back    [facility] [phone] [address], [place], [place] [address] Phone: [phone] Email: [email or web address]

## 2022-06-04 NOTE — Patient Instructions (Signed)
Augmentin 875mg  Twice daily  for 7 days, take with food.  Prednisone taper over next week  Continue on Trelegy 1 puff daily , rinse after use.  Albuterol inhaler or neb As needed   CT chest to evaluate lung nodules Follow up with Dr. Patsey Frazier in 3 months and As needed   Please contact office for sooner follow up if symptoms do not improve or worsen or seek emergency care

## 2022-06-04 NOTE — Telephone Encounter (Signed)
Referral 2300 made emergent due to family being evicted and food insecurities, patient DPR daughter aware of referral has been made and that [facility] will be contacting them.Received verbal from Dr. Volanda Napoleon for referral.

## 2022-06-04 NOTE — Progress Notes (Signed)
  Care Coordination  Outreach Note  06/04/2022 Name: Lynasia C. Colan MRN: [id number] DOB: [birth year]   Care Coordination Outreach Attempts: An unsuccessful telephone outreach was attempted today to offer the patient information about available care coordination services as a benefit of their health plan.   Follow Up Plan:  Additional outreach attempts will be made to offer the patient care coordination information and services.   Encounter Outcome:  No Answer  Julian Hy, [place] Direct Dial: [phone]

## 2022-06-04 NOTE — Progress Notes (Signed)
  Care Coordination   Note   06/04/2022 Name: Gina Frazier MRN: [id number] DOB: Apr 07, 1945  Gina Frazier is a 77 y.o. year old female who sees No primary care provider on file. for primary care. I reached out to Ranyia C. Mathew by phone today to offer care coordination services.  [name] was given information about Care Coordination services today including:   The Care Coordination services include support from the care team which includes your Nurse Coordinator, Clinical Social Worker, or Pharmacist.  The Care Coordination team is here to help remove barriers to the health concerns and goals most important to you. Care Coordination services are voluntary, and the patient may decline or stop services at any time by request to their care team member.   Care Coordination Consent Status: Patient agreed to services and verbal consent obtained.   Follow up plan:  Telephone appointment with care coordination team member scheduled for:  06/05/2022  Encounter Outcome:  Pt. Scheduled from referral   Julian Hy, [place] Direct Dial: [phone]

## 2022-06-04 NOTE — Progress Notes (Signed)
Agree with the details of the visit as noted by Tammy Parrett, NP.  C. Laura Yanna Leaks, MD [facility] 

## 2022-06-04 NOTE — Progress Notes (Signed)
@Patient  ID: Gina Frazier, female    DOB: 01/31/1946, 77 y.o.   MRN: [id number]  Chief Complaint  Patient presents with   Follow-up    Referring provider: No ref. provider found  HPI: 77 year old female never smoker followed for asthma and lung nodules  TEST/EVENTS :  CT chest November 18, 2021 shows new nodules in the left upper lobe possible infectious versus inflammatory.  Small nodules in the right upper lobe.  PFTs May 23, 2021 showed normal lung function with FEV1 at 94%, ratio 81, FVC 87%, no significant bronchodilator response, DLCO 99%  06/04/2022 Follow up : Asthma and Lung nodules  Patient presents for a 6 month follow up .  Patient is followed for underlying asthma and pulmonary nodules.  She complains over the last month that she has been having increased cough, congestion with thick yellow mucus and wheezing.  She has been taking her Trelegy every day.  Is having to use her albuterol nebulizer at least once a day.  She denies any hemoptysis, chest pain, orthopnea, edema, nausea vomiting or diarrhea Patient has known pulmonary nodules that are followed by serial CT scan.  Last CT was in September 2023 that showed some new nodules in the left upper lobe and right upper lobe.  Patient's follow-up CT chest is pending. Accompanied by Daughter, they are having a lot of family stress with finances and family /pet illnesses. Worried she may be evicted from apartment. Working with PCP , referred to Education officer, museum to help.    Allergies  Allergen Reactions   Pravastatin     Diarrhea/constipation per pt     Fish Allergy Nausea And Vomiting, Swelling and Rash    Immunization History  Administered Date(s) Administered   Fluad Quad(high Dose 65+) 11/10/2018, 12/11/2019, 12/05/2020, 11/21/2021   Hepatitis B 03/16/2014, 10/11/2014, 11/25/2014, 07/29/2015   Hepatitis B, ADULT 03/16/2014, 10/11/2014, 11/25/2014, 07/29/2015   Influenza Split 01/05/2011, 12/23/2011, 01/06/2013    Influenza Whole 04/19/2010   Influenza, High Dose Seasonal PF 12/17/2015, 02/12/2017, 12/03/2017   Moderna Covid-19 Vaccine Bivalent Booster 30yrs & up 01/23/2021   Moderna SARS-COV2 Booster Vaccination 02/15/2020   Moderna Sars-Covid-2 Vaccination 04/28/2019, 05/27/2019   Pneumococcal Conjugate-13 09/29/2013, 01/28/2017   Pneumococcal Polysaccharide-23 07/21/2010   Td 04/19/2010   Tdap 01/06/2013   Zoster, Live 07/31/2013    Past Medical History:  Diagnosis Date   Allergy    Anxiety    Asthma    Bell's palsy    left   Chronic back pain    Family history of adverse reaction to anesthesia    pts daughter had severe vomiting 2014    Hip joint pain    left   HLD (hyperlipidemia)    Hypertension    Neuropathy    Osteoarthritis    PAF (paroxysmal atrial fibrillation) ([place])    a. initially diagnosed 04/2014; b. on Eliquis; c. CHADS2VASc = 3   Palpitations    Wears contact lenses    Wears dentures    partial upper    Tobacco History: Social History   Tobacco Use  Smoking Status Never  Smokeless Tobacco Never   Counseling given: Not Answered   Outpatient Medications Prior to Visit  Medication Sig Dispense Refill   acetaminophen (TYLENOL) 500 MG tablet Take 1,000 mg every 8 (eight) hours as needed by mouth for mild pain.     albuterol (PROVENTIL) (2.5 MG/3ML) 0.083% nebulizer solution Take 3 mLs (2.5 mg total) by nebulization every 6 (six) hours as needed  for wheezing or shortness of breath. 150 mL 11   albuterol (VENTOLIN HFA) 108 (90 Base) MCG/ACT inhaler Inhale 1-2 puffs into the lungs every 6 (six) hours as needed for wheezing or shortness of breath. 18 g 12   alendronate (FOSAMAX) 70 MG tablet Take 1 tablet (70 mg total) by mouth every 7 (seven) days. Take with a full glass of water on an empty stomach. 4 tablet 11   amLODipine (NORVASC) 10 MG tablet Take 1/2 pill daily (5 mg). After one week if blood pressure still >140, increase up to 10 mg daily (whole pill) 90  tablet 3   apixaban (ELIQUIS) 5 MG TABS tablet TAKE 1 TABLET BY MOUTH TWICE DAILY 180 tablet 1   Calcium Carbonate-Vitamin D 600-400 MG-UNIT tablet Take by mouth.     Cholecalciferol (VITAMIN D) 2000 units tablet Take 2,000 Units by mouth daily.     donepezil (ARICEPT) 5 MG tablet Take 5 mg by mouth at bedtime.     flecainide (TAMBOCOR) 50 MG tablet TAKE 1 TABLET(50 MG) BY MOUTH TWICE DAILY 90 tablet 3   Fluticasone-Umeclidin-Vilant (TRELEGY ELLIPTA) 100-62.5-25 MCG/ACT AEPB INHALE 1 PUFF INTO THE LUNGS DAILY 60 each 2   ipratropium (ATROVENT) 0.06 % nasal spray Place 2 sprays into both nostrils 3 (three) times daily. 15 mL 12   levocetirizine (XYZAL) 5 MG tablet Take 0.5-1 tablets (2.5-5 mg total) by mouth at bedtime as needed for allergies. 90 tablet 3   memantine (NAMENDA) 5 MG tablet Take 5 mg by mouth 2 (two) times daily.     methocarbamol (ROBAXIN) 500 MG tablet Take 500 mg by mouth 2 (two) times daily.     Multiple Vitamins-Minerals (MULTIVITAMIN WITH MINERALS) tablet Take 1 tablet by mouth daily.     nitroGLYCERIN (NITROSTAT) 0.4 MG SL tablet Place 0.4 mg under the tongue every 5 (five) minutes as needed for chest pain.     Olopatadine HCl (PATADAY) 0.7 % SOLN Apply 1 drop to eye daily. 2.5 mL 22   propranolol ER (INDERAL LA) 60 MG 24 hr capsule TAKE 1 CAPSULE(60 MG) BY MOUTH DAILY 90 capsule 3   sodium chloride (OCEAN) 0.65 % SOLN nasal spray Place 1-2 sprays into both nostrils daily as needed for congestion. 30 mL 12   telmisartan (MICARDIS) 40 MG tablet TAKE 60 MG(1 AND 1/2 TABLET) NIGHTLY 45 tablet 6   topiramate (TOPAMAX) 50 MG tablet Take 100 mg by mouth 2 (two) times daily.     zolpidem (AMBIEN) 5 MG tablet TAKE 1 TABLET(5 MG) BY MOUTH AT BEDTIME AS NEEDED FOR SLEEP 30 tablet 5   Fluticasone-Umeclidin-Vilant (TRELEGY ELLIPTA) 100-62.5-25 MCG/ACT AEPB Inhale 100 mcg into the lungs daily. 28 each 0   benzonatate (TESSALON) 200 MG capsule Take 1 capsule (200 mg total) by mouth 3  (three) times daily as needed for cough. (Patient not taking: Reported on 06/04/2022) 30 capsule 2   hydrocortisone 2.5 % cream Apply topically 2 (two) times daily. Prn right thigh (Patient not taking: Reported on 06/04/2022) 30 g 0   ketoconazole (NIZORAL) 2 % cream Apply 1 application. topically daily. (Patient not taking: Reported on 06/04/2022) 30 g 0   No facility-administered medications prior to visit.     Review of Systems:   Constitutional:   No  weight loss, night sweats,  Fevers, chills, fatigue, or  lassitude.  HEENT:   No headaches,  Difficulty swallowing,  Tooth/dental problems, or  Sore throat,  No sneezing, itching, ear ache, nasal congestion, post nasal drip,   CV:  No chest pain,  Orthopnea, PND, swelling in lower extremities, anasarca, dizziness, palpitations, syncope.   GI  No heartburn, indigestion, abdominal pain, nausea, vomiting, diarrhea, change in bowel habits, loss of appetite, bloody stools.   Resp: .  No chest wall deformity  Skin: no rash or lesions.  GU: no dysuria, change in color of urine, no urgency or frequency.  No flank pain, no hematuria   MS:  No joint pain or swelling.  No decreased range of motion.  No back pain.    Physical Exam  BP 130/60 (BP Location: Left Arm, Cuff Size: Normal)   Pulse 60   Temp 98 F (36.7 C) (Temporal)   Ht 5\' 2"  (1.575 m)   Wt 146 lb (66.2 kg)   SpO2 100%   BMI 26.70 kg/m   GEN: A/Ox3; pleasant , NAD, well nourished    HEENT:  [place]/AT,   NOSE-clear, THROAT-clear, no lesions, no postnasal drip or exudate noted.   NECK:  Supple w/ fair ROM; no JVD; normal carotid impulses w/o bruits; no thyromegaly or nodules palpated; no lymphadenopathy.    RESP  Clear  P & A; w/o, wheezes/ rales/ or rhonchi. no accessory muscle use, no dullness to percussion  CARD:  RRR, no m/r/g, no peripheral edema, pulses intact, no cyanosis or clubbing.  GI:   Soft & nt; nml bowel sounds; no organomegaly or masses  detected.   Musco: Warm bil, no deformities or joint swelling noted.   Neuro: alert, no focal deficits noted.    Skin: Warm, no lesions or rashes    Lab Results:  CBC    BNP No results found for: "BNP"  ProBNP No results found for: "PROBNP"  Imaging: No results found.        No data to display          No results found for: "NITRICOXIDE"      Assessment & Plan:   Asthmatic bronchitis with exacerbation Acute asthmatic bronchitic exacerbation.  Will treat with Augmentin and prednisone taper. Continue with Trelegy inhaler.  Albuterol inhaler or nebulizers as needed.  Plan  Patient Instructions  Augmentin 875mg  Twice daily  for 7 days, take with food.  Prednisone taper over next week  Continue on Trelegy 1 puff daily , rinse after use.  Albuterol inhaler or neb As needed   CT chest to evaluate lung nodules Follow up with Dr. Patsey Berthold in 3 months and As needed   Please contact office for sooner follow up if symptoms do not improve or worsen or seek emergency care      Lung nodules Scattered lung nodules and a non-smoker questionable etiology.  Continue surveillance CT.  Check CT chest without contrast to follow     Rexene Edison, NP 06/04/2022

## 2022-06-04 NOTE — Assessment & Plan Note (Signed)
Scattered lung nodules and a non-smoker questionable etiology.  Continue surveillance CT.  Check CT chest without contrast to follow

## 2022-06-05 ENCOUNTER — Ambulatory Visit: Payer: Self-pay | Admitting: *Deleted

## 2022-06-05 ENCOUNTER — Telehealth: Payer: Self-pay

## 2022-06-05 NOTE — Patient Outreach (Addendum)
  Care Coordination   Initial Visit Note   06/05/2022 Name: Alexzandrea C. Daris MRN: [id number] DOB: June 05, 1945  Caydence C. Lehmkuhl is a 77 y.o. year old female who sees No primary care provider on file. for primary care. I spoke with  Jameah C. Blank's daughter by phone today.  What matters to the patients health and wellness today?  Housing resources -patient's daughter discussed having financial challenges and is now 1 month behind in the rent. Patient's daughter confirmed main reason for delay in rent payments are due to payments for medical follow up between patient , herself , her daughter and the family pet. Per patient's daughter, she has reached out to churches and other organizations for [employer] for rental assistance applied for and is now pending. Patient's daughter able to pay rent on the 4/1 and is hopeful that the rental assistance applied for will be approved to take care of the back rent for March. Additional resources to be mailed to patient and daughter for review.   Goals Addressed             This Visit's Progress    Housing Resources, eviction prevention       Activities and task to complete in order to accomplish goals.   Call or go to Department of Social Services to follow up on rental assistance Follow up with rental assistance program-per patient's daughter application submitted, status pending Patient's daughter to call Legal Aid to discuss possible assistance with eviction Follow up with housing resources once received Follow up with status of food stamp application  I have placed a referral with [facility] for housing resources         SDOH assessments and interventions completed:  Yes  SDOH Interventions Today    Flowsheet Row Most Recent Value  SDOH Interventions   Food Insecurity Interventions --  [food stamp application pending,]  Housing Interventions Other (Comment)  [Shelter resources to be provided as well as food bank  resources]  Transportation Interventions Intervention Not Indicated        Care Coordination Interventions:  Yes, provided  Interventions Today    Flowsheet Row Most Recent Value  Chronic Disease   Chronic disease during today's visit Other  [Mild alzheimers dementia w/anxiety]  General Interventions   General Interventions Discussed/Reviewed General Interventions Discussed, Higher education careers adviser information provided for Legal Aid for eviction assistance, shelter resources to be provided by mail]  Nutrition Interventions   Nutrition Discussed/Reviewed Nutrition Discussed  [food bank resources to be mailed to patient's home]       Follow up plan: Follow up call scheduled for 06/08/22    Encounter Outcome:  Pt. Visit Completed

## 2022-06-05 NOTE — Patient Instructions (Signed)
Visit Information  Thank you for taking time to visit with me today. Please don't hesitate to contact me if I can be of assistance to you.   Following are the goals we discussed today:   Goals Addressed             This Visit's Progress    Housing Resources, eviction prevention       Activities and task to complete in order to accomplish goals.   Call or go to Department of Social Services to follow up on rental assistance Follow up with rental assistance program-per patient's daughter application submitted, status pending Patient's daughter to call Legal Aid to discuss possible assistance with eviction Follow up with housing resources once received Follow up with status of food stamp application  I have placed a referral with [facility] for housing resources         Our next appointment is by telephone on 06/08/22 at 10am  Please call the care guide team at [phone] if you need to cancel or reschedule your appointment.   If you are experiencing a Mental Health or [place] or need someone to talk to, please call 911   Patient verbalizes understanding of instructions and care plan provided today and agrees to view in [place]. Active MyChart status and patient understanding of how to access instructions and care plan via MyChart confirmed with patient.     Telephone follow up appointment with care management team member scheduled for: 06/08/22  Elliot Gurney, [place] Worker  [facility] [phone]

## 2022-06-05 NOTE — Telephone Encounter (Signed)
   Telephone encounter was:  Unsuccessful.  06/05/2022 Name: Gina Frazier MRN: [id number] DOB: 07/10/1945  Unsuccessful outbound call made today to assist with:  Food Insecurity, Financial Difficulties related to financial strain, and housing   Outreach Attempt:  2nd Attempt  A HIPAA compliant voice message was left requesting a return call.  Instructed patient to call back    [facility] [phone] [address], [place], [place] [address] Phone: [phone] Email: [email or web address]

## 2022-06-07 NOTE — Progress Notes (Unsigned)
Date:  06/08/2022   ID:  Gina Frazier, Gina Frazier June 06, 1945, MRN [id number]  Patient Location:  [place] [address]   Provider location:   [facility], [place] office  PCP:  Gina Leitz, MD  Cardiologist:  Gina Rogue, MD   Chief Complaint  Patient presents with   6 month follow up     Patient is having rapid heart beats and elevated blood pressure; has a lot of family stress lately. Patient needs a letter with medications & diagnosis for Social Security purposes. Medications reviewed by the patient verbally.     History of Present Illness:    Gina Frazier is a 77 y.o. female with PMH of  Hypertension Evaluated in 2012 for acute hypertension in the dentist chair,  hospital with atrial fibrillation on 04/28/2014, started on Cardizem, Lopressor converting to normal sinus rhythm (Prior episode 6 months prior) started on anticoagulation with eliquis 5 mg by mouth twice a day,   flecainide 50 mg twice a day, continued on beta blocker. She presents today for follow-up of her atrial fibrillation, hypertension, left chest pain  Last seen by myself in clinic October 2023  Stress at home, recently spending money on a sick dog with pancreatitis, lots of trips to the vet Very expensive, no money to pay the rent Reports that they are likely going to get evicted Stress has affected her blood pressure and her sleep  Is having some tachycardia presumably from the stress Medications not holding her blood pressure  No regular exercise program Unclear if she is able to afford her expensive medications  EKG personally reviewed by myself on todays visit Normal sinus rhythm rate 57 bpm no significant ST-T wave changes  CT ABD Aortic atherosclerosis. mild to moderate diffuse aortic atherosclerosis descending aorta  Echocardiogram from the hospital shows ejection fraction 60-65%, otherwise normal study    Past Medical History:  Diagnosis  Date   Allergy    Anxiety    Asthma    Bell's palsy    left   Chronic back pain    Family history of adverse reaction to anesthesia    pts daughter had severe vomiting 2014    Hip joint pain    left   HLD (hyperlipidemia)    Hypertension    Neuropathy    Osteoarthritis    PAF (paroxysmal atrial fibrillation) ([place])    a. initially diagnosed 04/2014; b. on Eliquis; c. CHADS2VASc = 3   Palpitations    Wears contact lenses    Wears dentures    partial upper   Past Surgical History:  Procedure Laterality Date   CHOLECYSTECTOMY     COLONOSCOPY WITH PROPOFOL N/A 11/05/2014   Procedure: COLONOSCOPY WITH PROPOFOL;  Surgeon: Lucilla Lame, MD;  Location: [facility];  Service: Endoscopy;  Laterality: N/A;   COLONOSCOPY WITH PROPOFOL N/A 06/05/2021   Procedure: COLONOSCOPY WITH PROPOFOL;  Surgeon: [name], DO;  Location[facility] ENDOSCOPY;  Service: Gastroenterology;  Laterality: N/A;  ELIQUIS   ESOPHAGOGASTRODUODENOSCOPY (EGD) WITH PROPOFOL N/A 06/05/2021   Procedure: ESOPHAGOGASTRODUODENOSCOPY (EGD) WITH PROPOFOL;  Surgeon: [name], DO;  Location: [facility] ENDOSCOPY;  Service: Gastroenterology;  Laterality: N/A;   JOINT REPLACEMENT     left knee x 2 04/2024 and 01/2017 Dr. 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   albuterol (PROVENTIL) (2.5 MG/3ML) 0.083% nebulizer solution Take 3 mLs (2.5 mg total) by nebulization every 6 (six) hours as needed for wheezing or shortness of breath.   albuterol (VENTOLIN HFA) 108 (90 Base) MCG/ACT inhaler Inhale 1-2 puffs into the lungs every 6 (six) hours as needed for wheezing or shortness of breath.   alendronate (FOSAMAX) 70 MG tablet Take 1 tablet (70 mg total) by mouth every 7 (seven) days. Take with a full glass of water on an empty stomach.   amLODipine (NORVASC) 10 MG tablet Take 1/2 pill daily (5 mg). After one week if blood pressure still >140, increase up to 10 mg daily (whole pill)   amoxicillin-clavulanate (AUGMENTIN) 875-125 MG tablet Take 1 tablet by mouth 2 (two) times daily.   apixaban (ELIQUIS) 5 MG TABS tablet TAKE 1 TABLET BY MOUTH TWICE DAILY   Calcium Carbonate-Vitamin D 600-400 MG-UNIT tablet Take by mouth.   Cholecalciferol (VITAMIN D) 2000 units tablet Take 2,000 Units by mouth daily.   donepezil (ARICEPT) 5 MG tablet Take 5 mg by mouth at bedtime.   flecainide (TAMBOCOR) 50 MG tablet TAKE 1 TABLET(50 MG) BY MOUTH TWICE DAILY   Fluticasone-Umeclidin-Vilant (TRELEGY ELLIPTA) 100-62.5-25 MCG/ACT AEPB INHALE 1 PUFF INTO THE LUNGS DAILY   ipratropium (ATROVENT) 0.06 % nasal spray Place 2 sprays into both nostrils 3 (three) times daily.   memantine (NAMENDA) 5 MG tablet Take 5 mg by mouth 2 (two) times daily.   methocarbamol (ROBAXIN) 500 MG tablet Take 500 mg by mouth 2 (two) times daily.   Multiple Vitamins-Minerals (MULTIVITAMIN WITH MINERALS) tablet Take 1 tablet by mouth daily.   nitroGLYCERIN (NITROSTAT) 0.4 MG SL tablet Place 0.4 mg under the tongue every 5 (five) minutes as needed for chest pain.   Olopatadine HCl (PATADAY) 0.7 % SOLN Apply 1 drop to eye daily.   predniSONE (DELTASONE) 10 MG tablet 4 tabs for 2 days, then 3 tabs for 2 days, 2 tabs for 2 days, then 1 tab for 2 days, then stop   propranolol ER  (INDERAL LA) 60 MG 24 hr capsule TAKE 1 CAPSULE(60 MG) BY MOUTH DAILY   sodium chloride (OCEAN) 0.65 % SOLN nasal spray Place 1-2 sprays into both nostrils daily as needed for congestion.   telmisartan (MICARDIS) 40 MG tablet TAKE 60 MG(1 AND 1/2 TABLET) NIGHTLY   zolpidem (AMBIEN) 5 MG tablet TAKE 1 TABLET(5 MG) BY MOUTH AT BEDTIME AS NEEDED FOR SLEEP     Allergies:   Pravastatin and Fish allergy   Social History   Tobacco Use   Smoking status: Never   Smokeless tobacco: Never  Vaping Use   Vaping Use: Never used  Substance Use Topics   Alcohol use: No   Drug use: No     Family Hx: The patient's family history includes Arthritis in her daughter; Bladder Cancer in her sister; Breast cancer in an other family member; Breast cancer (age of onset: 55) in  her sister; Cancer in an other family member; Depression in her daughter, sister, and another family member; Diabetes in her mother and sister; GER disease in her daughter; Heart disease in her brother, father, mother, and another family member; Hypertension in her brother, mother, and sister; Hypothyroidism in her daughter; Lupus in her daughter; Mental illness in her mother.  ROS:   Please see the history of present illness.    Review of Systems  Constitutional: Negative.   HENT: Negative.    Respiratory: Negative.    Cardiovascular: Negative.   Gastrointestinal: Negative.   Musculoskeletal: Negative.   Neurological: Negative.   Psychiatric/Behavioral: Negative.    All other systems reviewed and are negative.    Labs/Other Tests and Data Reviewed:    Recent Labs: 08/14/2021: ALT 13; BUN 22; Creatinine, Ser 0.95; Hemoglobin 12.0; Platelets 205.0; Potassium 4.3; Sodium 141; TSH 4.41   Recent Lipid Panel Lab Results  Component Value Date/Time   CHOL 199 01/10/2021 02:30 PM   TRIG 54 01/10/2021 02:30 PM   HDL 79 01/10/2021 02:30 PM   CHOLHDL 2.5 01/10/2021 02:30 PM   LDLCALC 106 (H) 01/10/2021 02:30 PM    Wt Readings from  Last 3 Encounters:  06/08/22 146 lb 6 oz (66.4 kg)  06/04/22 146 lb (66.2 kg)  12/16/21 150 lb 4 oz (68.2 kg)     Exam:    Vital Signs:  BP (!) 156/76 (BP Location: Left Arm, Patient Position: Sitting, Cuff Size: Normal)   Pulse (!) 57   Ht 5\' 2"  (1.575 m)   Wt 146 lb 6 oz (66.4 kg)   SpO2 94%   BMI 26.77 kg/m   Constitutional:  oriented to person, place, and time. No distress.  HENT:  Head: Grossly normal Eyes:  no discharge. No scleral icterus.  Neck: No JVD, no carotid bruits  Cardiovascular: Regular rate and rhythm, no murmurs appreciated Pulmonary/Chest: Clear to auscultation bilaterally, no wheezes or rails Abdominal: Soft.  no distension.  no tenderness.  Musculoskeletal: Normal range of motion Neurological:  normal muscle tone. Coordination normal. No atrophy Skin: Skin warm and dry Psychiatric: normal affect, pleasant,3   ASSESSMENT & PLAN:    PAF (paroxysmal atrial fibrillation) (H[facility]) Maintaining normal sinus rhythm, continue current medication regimen  Essential hypertension Blood pressure running high, we have recommended she increase amlodipine up to 10 also start Cardura 1 mg twice a day (could start 1 mg daily for the first 2 weeks then up to 2 mg)  Chest pain, unspecified type No significant chest pain but has symptoms concerning for stress Financial issues, may get addicted  Gait instability recommend regular walking program Recommend she buy a walker  Aortic atherosclerosis Previously on statin with [facility]  Adjustment disorder Long discussion with patient and daughter, spent much of their money on vent pills, dog and at home diet from chronic pancreatitis now with lots of bills, no money for rent May get addicted   Total encounter time more than 30 minutes  Greater than 50% was spent in counseling and coordination of care with the patient    Signed, Gina Rogue, MD  06/08/2022 3:27 PM    [facility]  Office [id number], [place], [place] [address]

## 2022-06-08 ENCOUNTER — Ambulatory Visit: Payer: Medicare Other | Attending: Cardiovascular Disease | Admitting: Cardiovascular Disease

## 2022-06-08 ENCOUNTER — Encounter: Payer: Self-pay | Admitting: Cardiovascular Disease

## 2022-06-08 VITALS — BP 156/76 | HR 57 | Ht 62.0 in | Wt 146.4 lb

## 2022-06-08 DIAGNOSIS — I480 Paroxysmal atrial fibrillation: Secondary | ICD-10-CM | POA: Diagnosis not present

## 2022-06-08 DIAGNOSIS — E785 Hyperlipidemia, unspecified: Secondary | ICD-10-CM

## 2022-06-08 DIAGNOSIS — R079 Chest pain, unspecified: Secondary | ICD-10-CM | POA: Diagnosis not present

## 2022-06-08 DIAGNOSIS — I700 Atherosclerosis of aorta: Secondary | ICD-10-CM

## 2022-06-08 DIAGNOSIS — I10 Essential (primary) hypertension: Secondary | ICD-10-CM

## 2022-06-08 MED ORDER — DOXAZOSIN MESYLATE 1 MG PO TABS: 1.0000 mg | ORAL_TABLET | Freq: Two times a day (BID) | ORAL | 3 refills | Status: DC

## 2022-06-08 NOTE — Patient Instructions (Addendum)
Note saying she was seen today for afib/HTN, Medications changed for blood pressure  Medication Instructions:  Please increase the amlodipine 10 mg daily Please start cardura 1 mg twice a day  If you need a refill on your cardiac medications before your next appointment, please call your pharmacy.   Lab work: No new labs needed  Testing/Procedures: No new testing needed  Follow-Up: At [facility], you and your health needs are our priority.  As part of our continuing mission to provide you with exceptional heart care, we have created designated Provider Care Teams.  These Care Teams include your primary Cardiologist (physician) and Advanced Practice Providers (APPs -  Physician Assistants and Nurse Practitioners) who all work together to provide you with the care you need, when you need it.  You will need a follow up appointment in 6 months  Providers on your designated Care Team:   [name], NP [name], PA-C Cadence Kathlen Mody, [place]  COVID-19 Vaccine Information can be found at: [email or web address].uk For questions related to vaccine distribution or appointments, please email [email or web address] or call [phone].

## 2022-06-09 ENCOUNTER — Telehealth: Payer: Self-pay | Admitting: Licensed Clinical Social Worker

## 2022-06-09 ENCOUNTER — Ambulatory Visit: Payer: Self-pay

## 2022-06-09 NOTE — Telephone Encounter (Signed)
CSW received referral to assist with housing resources. CSW attempted to contact patient and her daughter with no success. Message left for return call. Raquel Sarna, [place], [place]

## 2022-06-09 NOTE — Patient Outreach (Signed)
  Care Coordination   Follow Up Visit Note   06/09/2022 Name: Gina Frazier MRN: [id number] DOB: [birth year]  Gina Frazier is a 77 y.o. year old female who sees Carollee Leitz, MD for primary care. I spoke with  Gina Frazier daughter by phone today.  What matters to the patients health and wellness today?  Financial assistance to avoid eviction.   Goals Addressed             This Visit's Progress    Housing Resources, eviction prevention       Care Coordination Interventions: Follow up with patients daughter on contacting [facility] and provided direct contact number ([phone]) to call (instead of email) for an update. Patients daughter agreed to call [place] program this week Provided more information regarding Eviction Diversion Program with the Emergency Housing Assistance. Provided 211 as an additional resource for any needs         SDOH assessments and interventions completed:  Yes  SDOH Interventions Today    Flowsheet Row Most Recent Value  SDOH Interventions   Housing Interventions Other (Comment)  [Discussed housing options with 211 and [employer] program, payment plan with landlord]        Care Coordination Interventions:  Yes, provided  Interventions Today    Flowsheet Row Most Recent Value  Chronic Disease   Chronic disease during today's visit Hypertension (HTN), Other  [Dementia]  General Interventions   General Interventions Discussed/Reviewed General Interventions Reviewed, [employer] Resources  [Reviewed contact with [employer] and provided contact number for follow up.  Provided 211 contact resource.  will follow up on food bank list that was mailed.]       Follow up plan: Follow up call scheduled for 06/19/22 at 10am.    Encounter Outcome:  Pt. Visit Completed   Lindie Spruce, [place]  Worker [facility] Care Management  [phone]

## 2022-06-09 NOTE — Patient Instructions (Signed)
Visit Information  Thank you for taking time to visit with me today. Please don't hesitate to contact me if I can be of assistance to you.   Following are the goals we discussed today:   Goals Addressed             This Visit's Progress    Housing Resources, eviction prevention       Care Coordination Interventions: Follow up with patients daughter on contacting [facility] and provided direct contact number ([phone]) to call (instead of email) for an update. Patients daughter agreed to call [place] program this week Provided more information regarding Eviction Diversion Program with the Emergency Housing Assistance. Provided 211 as an additional resource for any needs         Our next appointment is by telephone on 06/19/22 at 10am  Please call the care guide team at [phone] if you need to cancel or reschedule your appointment.   If you are experiencing a Mental Health or [place] or need someone to talk to, please call 911  Patient verbalizes understanding of instructions and care plan provided today and agrees to view in [place]. Active MyChart status and patient understanding of how to access instructions and care plan via MyChart confirmed with patient.     Telephone follow up appointment with care management team member scheduled for: 06/19/22  Lindie Spruce, [place] Worker [facility] Care Management  [phone]

## 2022-06-10 ENCOUNTER — Telehealth: Payer: Self-pay

## 2022-06-10 NOTE — Telephone Encounter (Signed)
     Telephone encounter was:  Unsuccessful.  06/10/2022 Name: Gina Frazier MRN: [id number] DOB: 1946/03/07  Unsuccessful outbound call made today to assist with:Food and Housing  Outreach Attempt:  3rd Attempt.  Referral closed unable to contact patient.  A HIPAA compliant voice message was left requesting a return call.  Instructed patient to call back    [place] [phone] [address], [place], [place] [address] Phone: [phone] Email: [email or web address]

## 2022-06-14 ENCOUNTER — Encounter: Payer: Self-pay | Admitting: Pulmonary Disease

## 2022-06-14 ENCOUNTER — Encounter: Payer: Self-pay | Admitting: Cardiovascular Disease

## 2022-06-15 NOTE — Telephone Encounter (Signed)
By definition she has failed outpatient therapy and should probably be seen at either urgent care or the emergency room.  She has a chest CT coming up.  We would be able to get a chest x-ray on urgent care or ED.  The most concerning thing is her fluctuation in blood pressure and that needs to be further investigated.

## 2022-06-17 NOTE — Telephone Encounter (Signed)
Just an FYI

## 2022-06-17 NOTE — Telephone Encounter (Signed)
Since we had a cancellation for tomorrow at 10:30 lets get her in then.

## 2022-06-18 ENCOUNTER — Encounter: Payer: Self-pay | Admitting: Pulmonary Disease

## 2022-06-18 ENCOUNTER — Ambulatory Visit: Payer: Medicare Other | Admitting: Pulmonary Disease

## 2022-06-18 VITALS — BP 128/80 | HR 53 | Temp 97.3°F | Ht 62.0 in | Wt 146.4 lb

## 2022-06-18 DIAGNOSIS — R918 Other nonspecific abnormal finding of lung field: Secondary | ICD-10-CM | POA: Diagnosis not present

## 2022-06-18 DIAGNOSIS — J301 Allergic rhinitis due to pollen: Secondary | ICD-10-CM | POA: Diagnosis not present

## 2022-06-18 DIAGNOSIS — R0602 Shortness of breath: Secondary | ICD-10-CM | POA: Diagnosis not present

## 2022-06-18 DIAGNOSIS — J4541 Moderate persistent asthma with (acute) exacerbation: Secondary | ICD-10-CM | POA: Diagnosis not present

## 2022-06-18 LAB — NITRIC OXIDE: Nitric Oxide: 13

## 2022-06-18 MED ORDER — TRELEGY ELLIPTA 200-62.5-25 MCG/ACT IN AEPB: 1.0000 | INHALATION_SPRAY | Freq: Every day | RESPIRATORY_TRACT | 0 refills | Status: DC

## 2022-06-18 NOTE — Patient Instructions (Addendum)
Increasing the strength of your maintenance inhaler to Trelegy 200.  This will replace your current inhaler for now.  You have received enough samples to last you a month.  Make sure you rinse your mouth well after you use it.  We are going to get heart test to evaluate your shortness of breath.  You are already scheduled for a CT of the chest coming up, keep that appointment.  You may take Zyrtec (cetirizine) over-the-counter, 10 mg, 1 tablet at bedtime to see if this helps with your congestion issues.  We will see you in follow-up in 3 to 4 weeks time call sooner should any new problems arise.  See me or the nurse practitioner at that time

## 2022-06-18 NOTE — Progress Notes (Signed)
Subjective:    Patient ID: Gina Frazier, female    DOB: 1945-05-21, 77 y.o.   MRN: [id number] Patient Care Team: Carollee Leitz, MD as PCP - General (Family Medicine) Minna Merritts, MD (Cardiology) Tyler Pita, MD as Consulting Physician (Pulmonary Disease)  Chief Complaint  Patient presents with   Follow-up    SOB. Wheezing. Cough with yellow sputum. Finished Augmentin and Prednisone last week.     HPI Patient is a 77 year old lifelong never smoker who follows here for the issue of lung nodules and asthma.  This is an ACUTE visit.  She was last evaluated on 04 June 2022 by Rexene Edison, NP.   Recap of the visit with Rexene Edison, NP: 06/04/2022 Follow up : Asthma and Lung nodules  Patient presents for a 6 month follow up .  Patient is followed for underlying asthma and pulmonary nodules.  She complains over the last month that she has been having increased cough, congestion with thick yellow mucus and wheezing.  She has been taking her Trelegy every day.  Is having to use her albuterol nebulizer at least once a day.  She denies any hemoptysis, chest pain, orthopnea, edema, nausea vomiting or diarrhea Patient has known pulmonary nodules that are followed by serial CT scan.  Last CT was in September 2023 that showed some new nodules in the left upper lobe and right upper lobe.  Patient's follow-up CT chest is pending. Accompanied by Daughter, they are having a lot of family stress with finances and family /pet illnesses. Worried she may be evicted from apartment. Working with PCP , referred to Education officer, museum to help.   Today's visit: Presents with her daughter.  Daughter provides much of the history as the patient does have Alzheimer's.  The patient cannot contribute some to the history.  Since the above visit, the patient per the daughter, has continued to have increased cough, congestion and wheezing.  She is still having to use her nebulizer 2 or 3 times per day.  She does  not appear to be uncomfortable per the daughter.  She has not had any fevers, chills or sweats.  At her last visit she was given a prednisone taper and Augmentin.  The patient did not note any change on her symptoms with this therapy.  She is compliant with Trelegy 100.  She does not endorse any chest pain.  No orthopnea or paroxysmal nocturnal dyspnea.  No gastroesophageal reflux.  No lower extremity edema or calf tenderness.  Has been having some nasal congestion particularly in the mornings.  Daughter states that they have been under a great deal of stress, they been threatened with action from their apartment.   TEST/EVENTS :  CT chest November 18, 2021 shows new nodules in the left upper lobe possible infectious versus inflammatory.  Small nodules in the right upper lobe.   PFTs May 23, 2021 showed normal lung function with FEV1 at 94%, ratio 81, FVC 87%, no significant bronchodilator response, DLCO 99%  Review of Systems A 10 point review of systems was performed and it is as noted above otherwise negative.  Patient Active Problem List   Diagnosis Date Noted   Asthmatic bronchitis with exacerbation 06/04/2022   Lung nodules 06/04/2022   Brittle nails 08/14/2021   Abnormal gait 04/15/2021   Mild intermittent asthma without complication [id number]   Mild Alzheimer's dementia with anxiety 03/26/2021   Cataract of both eyes 01/13/2021   Aortic atherosclerosis 10/13/2019   Seasonal allergic  rhinitis 06/29/2019   Hyperlipidemia 06/29/2019   Memory loss 02/16/2019   MDD (major depressive disorder), single episode, moderate 01/18/2019   GAD (generalized anxiety disorder) 01/18/2019   Right knee pain 06/07/2018   Insomnia due to mental disorder 06/07/2018   Osteoporosis 07/29/2017   Orthostatic hypotension 07/05/2017   Bradycardia 07/05/2017   Essential tremor 03/31/2017   Hip pain 03/31/2017   Hoarseness of voice 03/31/2017   Hyperglycemia 03/31/2017   Status post revision of  total knee replacement, left 02/11/2017   Chronic pain of left knee 02/08/2017   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   Weight gain 03/18/2016   Numbness 01/28/2016   Chronic bilateral low back pain 12/12/2015   Weakness    Weight loss    PAF (paroxysmal atrial fibrillation)    Primary osteoarthritis of knee 05/02/2015   Special screening for malignant neoplasms, colon    Snoring 05/01/2014   Anxiety 12/30/2010   Preoperative cardiovascular examination 12/30/2010   Hypertension 07/21/2010   Chronic back pain 07/21/2010   Vitamin D deficiency 07/21/2010   SHORTNESS OF BREATH 05/01/2010   Essential hypertension 04/18/2010   OSTEOARTHROS UNSPEC GEN/LOC OTH SPEC SITES 04/18/2010   KNEE PAIN, LEFT 04/18/2010   Social History   Tobacco Use   Smoking status: Never   Smokeless tobacco: Never  Substance Use Topics   Alcohol use: No   Allergies  Allergen Reactions   Pravastatin     Diarrhea/constipation per pt     Fish Allergy Nausea And Vomiting, Swelling and Rash   Current Meds  Medication Sig   acetaminophen (TYLENOL) 500 MG tablet Take 1,000 mg every 8 (eight) hours as needed by mouth for mild pain.   albuterol (PROVENTIL) (2.5 MG/3ML) 0.083% nebulizer solution Take 3 mLs (2.5 mg total) by nebulization every 6 (six) hours as needed for wheezing or shortness of breath.   albuterol (VENTOLIN HFA) 108 (90 Base) MCG/ACT inhaler Inhale 1-2 puffs into the lungs every 6 (six) hours as needed for wheezing or shortness of breath.   alendronate (FOSAMAX) 70 MG tablet Take 1 tablet (70 mg total) by mouth every 7 (seven) days. Take with a full glass of water on an empty stomach.   amLODipine (NORVASC) 10 MG tablet Take 1/2 pill daily (5 mg). After one week if blood pressure still >140, increase up to 10 mg daily (whole pill)   apixaban (ELIQUIS) 5 MG TABS tablet TAKE 1 TABLET BY MOUTH TWICE DAILY   Calcium Carbonate-Vitamin D 600-400 MG-UNIT tablet Take by mouth.   Cholecalciferol (VITAMIN  D) 2000 units tablet Take 2,000 Units by mouth daily.   donepezil (ARICEPT) 5 MG tablet Take 5 mg by mouth at bedtime.   doxazosin (CARDURA) 1 MG tablet Take 1 tablet (1 mg total) by mouth 2 (two) times daily.   flecainide (TAMBOCOR) 50 MG tablet TAKE 1 TABLET(50 MG) BY MOUTH TWICE DAILY   Fluticasone-Umeclidin-Vilant (TRELEGY ELLIPTA) 100-62.5-25 MCG/ACT AEPB INHALE 1 PUFF INTO THE LUNGS DAILY   ipratropium (ATROVENT) 0.06 % nasal spray Place 2 sprays into both nostrils 3 (three) times daily.   ketoconazole (NIZORAL) 2 % cream Apply 1 application. topically daily.   levocetirizine (XYZAL) 5 MG tablet Take 0.5-1 tablets (2.5-5 mg total) by mouth at bedtime as needed for allergies.   memantine (NAMENDA) 5 MG tablet Take 5 mg by mouth 2 (two) times daily.   methocarbamol (ROBAXIN) 500 MG tablet Take 500 mg by mouth 2 (two) times daily.   Multiple Vitamins-Minerals (MULTIVITAMIN WITH MINERALS) tablet Take  1 tablet by mouth daily.   nitroGLYCERIN (NITROSTAT) 0.4 MG SL tablet Place 0.4 mg under the tongue every 5 (five) minutes as needed for chest pain.   Olopatadine HCl (PATADAY) 0.7 % SOLN Apply 1 drop to eye daily.   propranolol ER (INDERAL LA) 60 MG 24 hr capsule TAKE 1 CAPSULE(60 MG) BY MOUTH DAILY   sodium chloride (OCEAN) 0.65 % SOLN nasal spray Place 1-2 sprays into both nostrils daily as needed for congestion.   telmisartan (MICARDIS) 40 MG tablet TAKE 60 MG(1 AND 1/2 TABLET) NIGHTLY   zolpidem (AMBIEN) 5 MG tablet TAKE 1 TABLET(5 MG) BY MOUTH AT BEDTIME AS NEEDED FOR SLEEP   Immunization History  Administered Date(s) Administered   Fluad Quad(high Dose 65+) 11/10/2018, 12/11/2019, 12/05/2020, 11/21/2021   Hepatitis B 03/16/2014, 10/11/2014, 11/25/2014, 07/29/2015   Hepatitis B, ADULT 03/16/2014, 10/11/2014, 11/25/2014, 07/29/2015   Influenza Split 01/05/2011, 12/23/2011, 01/06/2013   Influenza Whole 04/19/2010   Influenza, High Dose Seasonal PF 12/17/2015, 02/12/2017, 12/03/2017    Moderna Covid-19 Vaccine Bivalent Booster 73yrs & up 01/23/2021   Moderna SARS-COV2 Booster Vaccination 02/15/2020   Moderna Sars-Covid-2 Vaccination 04/28/2019, 05/27/2019   Pneumococcal Conjugate-13 09/29/2013, 01/28/2017   Pneumococcal Polysaccharide-23 07/21/2010   Td 04/19/2010   Tdap 01/06/2013   Zoster, Live 07/31/2013       Objective:   Physical Exam BP 128/80 (BP Location: Left Arm, Cuff Size: Normal)   Pulse (!) 53   Temp (!) 97.3 F (36.3 C)   Ht 5\' 2"  (1.575 m)   Wt 146 lb 6.4 oz (66.4 kg)   SpO2 96%   BMI 26.78 kg/m   SpO2: 96 % O2 Device: None (Room air)  GENERAL: Well-developed well-nourished woman, fully ambulatory, no acute distress.  No conversational dyspnea. HEAD: Normocephalic, atraumatic.  EYES: Pupils equal, round, reactive to light.  No scleral icterus.  MOUTH: Dentition intact, oral mucosa moist.  No thrush. NECK: Supple. No thyromegaly. Trachea midline. No JVD.  No adenopathy. PULMONARY: Good air entry bilaterally.  No adventitious sounds. CARDIOVASCULAR: S1 and S2. Regular rate and rhythm.  ABDOMEN: Benign. MUSCULOSKELETAL: No joint deformity, no clubbing, no edema.  No Raynaud's noted today. NEUROLOGIC: Mild resting tremor (essential) no other focality. SKIN: Intact,warm,dry.  On limited exam, no rashes.   PSYCH: Confused at times.    Lab Results  Component Value Date   NITRICOXIDE 13 06/18/2022  *No evidence of type II inflammation.    Assessment & Plan:     ICD-10-CM   1. SOB (shortness of breath)  R06.02 Nitric oxide    ECHOCARDIOGRAM COMPLETE   Could not attribute to pulmonary etiology Query cardiac etiology Check 2D echo    2. Moderate persistent asthmatic bronchitis with acute exacerbation  J45.41    Currently do not identify anything to suggest infection I suspect she is improving No bronchospasm noted today Empirically  increase Trelegy to 200 strength    3. Seasonal allergic rhinitis due to pollen  J30.1    Zyrtec as  needed at nighttime    4. Multiple lung nodules  R91.8    Has upcoming CT chest 16 April     Orders Placed This Encounter  Procedures   Nitric oxide   ECHOCARDIOGRAM COMPLETE    Standing Status:   Future    Standing Expiration Date:   06/18/2023    Order Specific Question:   Where should this test be performed    Answer:   [facility] [place]    Order Specific Question:  Perflutren DEFINITY (image enhancing agent) should be administered unless hypersensitivity or allergy exist    Answer:   Administer Perflutren    Order Specific Question:   Reason for exam-Echo    Answer:   Dyspnea  R06.00   Meds ordered this encounter  Medications   Fluticasone-Umeclidin-Vilant (TRELEGY ELLIPTA) 200-62.5-25 MCG/ACT AEPB    Sig: Inhale 1 puff into the lungs daily.    Dispense:  28 each    Refill:  0    Order Specific Question:   Lot Number?    Answer:   [id number]    Order Specific Question:   Expiration Date?    Answer:   08/15/2023    Order Specific Question:   Quantity    Answer:   2   Currently I do not feel that the patient needs additional prednisone nor antibiotics.  Will empirically increase Trelegy Ellipta to see if this helps with some of her symptoms.  Additionally, her shortness of breath may be related to cardiac etiology.  Will obtain 2D echo to assess.  Will see the patient in follow-up in 3 to 4 weeks time she is to call sooner should any problems arise.  Renold Don, MD Advanced Bronchoscopy PCCM [facility]    *This note was dictated using voice recognition software/Dragon.  Despite best efforts to proofread, errors can occur which can change the meaning. Any transcriptional errors that result from this process are unintentional and may not be fully corrected at the time of dictation.

## 2022-06-19 NOTE — Patient Instructions (Addendum)
Visit Information  Thank you for taking time to visit with me today. Please don't hesitate to contact me if I can be of assistance to you.   Following are the goals we discussed today:   Goals Addressed             This Visit's Progress    COMPLETED: Housing Resources, eviction prevention       Activities and task to complete in order to accomplish goals.   Call or go to Department of Social Services to follow up on rental assistance Follow up with rental assistance program-per patient's daughter application submitted, status pending Patient's daughter to call Legal Aid to discuss possible assistance with eviction Follow up with housing resources once received Follow up with status of food stamp application  I have placed a referral with [facility] for housing resources          If you are experiencing a Mental Health or Behavioral Health Crisis or need someone to talk to, please call 911  Patient verbalizes understanding of instructions and care plan provided today and agrees to view in MyChart. Active MyChart status and patient understanding of how to access instructions and care plan via MyChart confirmed with patient.     Lysle Morales, BSW Social Worker [facility]  [phone]

## 2022-06-21 ENCOUNTER — Other Ambulatory Visit: Payer: Self-pay | Admitting: Cardiovascular Disease

## 2022-06-22 NOTE — Progress Notes (Signed)
Date:  06/23/2022   ID:  Gina Frazier, DOB 31-Oct-1945, MRN [id number]  Patient Location:  [address] [place] [id number]   [facility]   [facility] [place] [facility]  PCP:  Dana AllanWalsh, Tanya, MD  Cardiologist:  None   Chief Complaint  Patient presents with   Fluctuating home bp readings    Did increase Amlodipine and start Cardura for past 2 weeks.    History of Present Illness:    Gina Frazier is a 77 y.o. female with PMH of  Hypertension Evaluated in 2012 for acute hypertension in the dentist chair,  hospital with atrial fibrillation on 04/28/2014, started on Cardizem, Lopressor converting to normal sinus rhythm (Prior episode 6 months prior) started on anticoagulation with eliquis 5 mg by mouth twice a day,   flecainide 50 mg twice a day, continued on beta blocker. She presents today for follow-up of her atrial fibrillation, hypertension, left chest pain  Last seen by myself in clinic March 2024 On that visit blood pressure elevated, started on Cardura 1 twice daily, amlodipine increased up to 10  Recent phone call that she was having labile systolic blood pressure 90  up to 210 with general malaise Lots of coughing, shortness of breath, wheezing Recent treatment with Augmentin and steroids Seen by pulmonary June 18, 2022 Felt to have moderate persistent asthmatic bronchitis Echo ordered  On further discussion today, breathing status has improved after treatment with pulmonary Daughter who presents with her today reports her blood pressure continues to be labile At unclear times will run low, other times running very high Blood pressure in the office today 190 systolic, confirmed on my recheck Does not appear anxious though she reports that her brain is always thinking of things  Eating healthy, compliant with her medications she reports  Stumbling when walking at times  Recent stressors at home have improved somewhat,  reports that they received 2 months of rent assistance, also received additional Social Security payment to buy food  Periodically with strong heartbeat, tachycardia Continues to take extended release propranolol  Orthostatics checked today, lowest blood pressure with standing 179/82  EKG personally reviewed by myself on todays visit Normal sinus rhythm rate 67 bpm no significant ST-T wave changes  CT ABD Aortic atherosclerosis. mild to moderate diffuse aortic atherosclerosis descending aorta  Echocardiogram from the hospital shows ejection fraction 60-65%, otherwise normal study    Past Medical History:  Diagnosis Date   Allergy    Anxiety    Asthma    Bell's palsy    left   Chronic back pain    Family history of adverse reaction to anesthesia    pts daughter had severe vomiting 2014    Hip joint pain    left   HLD (hyperlipidemia)    Hypertension    Neuropathy    Osteoarthritis    PAF (paroxysmal atrial fibrillation)    a. initially diagnosed 04/2014; b. on Eliquis; c. CHADS2VASc = 3   Palpitations    Wears contact lenses    Wears dentures    partial upper   Past Surgical History:  Procedure Laterality Date   CHOLECYSTECTOMY     COLONOSCOPY WITH PROPOFOL N/A 11/05/2014   Procedure: COLONOSCOPY WITH PROPOFOL;  Surgeon: Midge Miniumarren Wohl, MD;  [facility];  Service: Endoscopy;  Laterality: N/A;   COLONOSCOPY WITH PROPOFOL N/A 06/05/2021   Procedure: COLONOSCOPY WITH PROPOFOL;  Surgeon: Jaynie Collinsusso, Steven Michael, DO;  Location: [facility];  Service: Gastroenterology;  Laterality: N/A;  ELIQUIS   ESOPHAGOGASTRODUODENOSCOPY (EGD) WITH PROPOFOL N/A 06/05/2021   Procedure: ESOPHAGOGASTRODUODENOSCOPY (EGD) WITH PROPOFOL;  Surgeon: Jaynie Collins, DO;  Location: [facility];  Service: Gastroenterology;  Laterality: N/A;   JOINT REPLACEMENT     left knee x 2 04/2024 and 01/2017 Dr. Rosita Kea    KNEE ARTHROSCOPY Left 06/28/2014   Procedure: ARTHROSCOPY KNEE WITH  PARTIALLATERAL MENISECTOMY AND DEBRIDEMENT;  Surgeon: Jene Every, MD;  Location: [facility];  Service: Orthopedics;  Laterality: Left;   TOOTH EXTRACTION     TOTAL KNEE ARTHROPLASTY Left 05/02/2015   Procedure: TOTAL KNEE ARTHROPLASTY;  Surgeon: Kennedy Bucker, MD;  Location: [facility];  Service: Orthopedics;  Laterality: Left;   TOTAL KNEE REVISION Left 02/11/2017   Procedure: TOTAL KNEE REVISION, POLYETHELENE EXCHANGE;  Surgeon: Kennedy Bucker, MD;  Location: [facility];  Service: Orthopedics;  Laterality: Left;     Current Meds  Medication Sig   acetaminophen (TYLENOL) 500 MG tablet Take 1,000 mg every 8 (eight) hours as needed by mouth for mild pain.   albuterol (PROVENTIL) (2.5 MG/3ML) 0.083% nebulizer solution Take 3 mLs (2.5 mg total) by nebulization every 6 (six) hours as needed for wheezing or shortness of breath.   albuterol (VENTOLIN HFA) 108 (90 Base) MCG/ACT inhaler Inhale 1-2 puffs into the lungs every 6 (six) hours as needed for wheezing or shortness of breath.   alendronate (FOSAMAX) 70 MG tablet Take 1 tablet (70 mg total) by mouth every 7 (seven) days. Take with a full glass of water on an empty stomach.   amLODipine (NORVASC) 10 MG tablet Take 1/2 pill daily (5 mg). After one week if blood pressure still >140, increase up to 10 mg daily (whole pill)   apixaban (ELIQUIS) 5 MG TABS tablet TAKE 1 TABLET BY MOUTH TWICE DAILY   Calcium Carbonate-Vitamin D 600-400 MG-UNIT tablet Take by mouth.   Cholecalciferol (VITAMIN D) 2000 units tablet Take 2,000 Units by mouth daily.   donepezil (ARICEPT) 5 MG tablet Take 5 mg by mouth at bedtime.   doxazosin (CARDURA) 1 MG tablet Take 1 tablet (1 mg total) by mouth 2 (two) times daily.   flecainide (TAMBOCOR) 50 MG tablet TAKE 1 TABLET(50 MG) BY MOUTH TWICE DAILY   Fluticasone-Umeclidin-Vilant (TRELEGY ELLIPTA) 200-62.5-25 MCG/ACT AEPB Inhale 1 puff into the lungs daily.   ipratropium (ATROVENT) 0.06 % nasal spray Place 2 sprays into both nostrils 3  (three) times daily.   ketoconazole (NIZORAL) 2 % cream Apply 1 application. topically daily.   memantine (NAMENDA) 5 MG tablet Take 5 mg by mouth 2 (two) times daily.   methocarbamol (ROBAXIN) 500 MG tablet Take 500 mg by mouth 2 (two) times daily.   Multiple Vitamins-Minerals (MULTIVITAMIN WITH MINERALS) tablet Take 1 tablet by mouth daily.   nitroGLYCERIN (NITROSTAT) 0.4 MG SL tablet Place 0.4 mg under the tongue every 5 (five) minutes as needed for chest pain.   Olopatadine HCl (PATADAY) 0.7 % SOLN Apply 1 drop to eye daily.   propranolol ER (INDERAL LA) 60 MG 24 hr capsule TAKE 1 CAPSULE(60 MG) BY MOUTH DAILY   sodium chloride (OCEAN) 0.65 % SOLN nasal spray Place 1-2 sprays into both nostrils daily as needed for congestion.   telmisartan (MICARDIS) 40 MG tablet TAKE 60 MG(1 AND 1/2 TABLET) NIGHTLY   zolpidem (AMBIEN) 5 MG tablet TAKE 1 TABLET(5 MG) BY MOUTH AT BEDTIME AS NEEDED FOR SLEEP     Allergies:   Pravastatin and Fish allergy  Social History   Tobacco Use   Smoking status: Never   Smokeless tobacco: Never  Vaping Use   Vaping Use: Never used  Substance Use Topics   Alcohol use: No   Drug use: No     Family Hx: The patient's family history includes Arthritis in her daughter; Bladder Cancer in her sister; Breast cancer in an other family member; Breast cancer (age of onset: 32) in her sister; Cancer in an other family member; Depression in her daughter, sister, and another family member; Diabetes in her mother and sister; GER disease in her daughter; Heart disease in her brother, father, mother, and another family member; Hypertension in her brother, mother, and sister; Hypothyroidism in her daughter; Lupus in her daughter; Mental illness in her mother.  ROS:   Please see the history of present illness.    Review of Systems  Constitutional: Negative.   HENT: Negative.    Respiratory: Negative.    Cardiovascular: Negative.   Gastrointestinal: Negative.    Musculoskeletal: Negative.   Neurological: Negative.   Psychiatric/Behavioral: Negative.    All other systems reviewed and are negative.    Labs/Other Tests and Data Reviewed:    Recent Labs: 08/14/2021: ALT 13; BUN 22; Creatinine, Ser 0.95; Hemoglobin 12.0; Platelets 205.0; Potassium 4.3; Sodium 141; TSH 4.41   Recent Lipid Panel Lab Results  Component Value Date/Time   CHOL 199 01/10/2021 02:30 PM   TRIG 54 01/10/2021 02:30 PM   HDL 79 01/10/2021 02:30 PM   CHOLHDL 2.5 01/10/2021 02:30 PM   LDLCALC 106 (H) 01/10/2021 02:30 PM    Wt Readings from Last 3 Encounters:  06/23/22 147 lb (66.7 kg)  06/18/22 146 lb 6.4 oz (66.4 kg)  06/08/22 146 lb 6 oz (66.4 kg)     Exam:    Vital Signs:  BP (!) 190/66 (BP Location: Left Arm, Patient Position: Sitting)   Pulse 67   Ht 5\' 2"  (1.575 m)   Wt 147 lb (66.7 kg)   SpO2 95%   BMI 26.89 kg/m   Constitutional:  oriented to person, place, and time. No distress.  HENT:  Head: Grossly normal Eyes:  no discharge. No scleral icterus.  Neck: No JVD, no carotid bruits  Cardiovascular: Regular rate and rhythm, no murmurs appreciated Pulmonary/Chest: Clear to auscultation bilaterally, no wheezes or rails Abdominal: Soft.  no distension.  no tenderness.  Musculoskeletal: Normal range of motion Neurological:  normal muscle tone. Coordination normal. No atrophy Skin: Skin warm and dry Psychiatric: normal affect, pleasant  ASSESSMENT & PLAN:    PAF (paroxysmal atrial fibrillation) (HCC) Maintaining normal sinus rhythm, Recommend she continue current dose of propranolol, flecainide, Eliquis  Essential hypertension Labile pressure, unclear how she is getting systolics of 90 ranging up to 190 No rhyme or reason for labile nature of her blood pressure, medication is relatively balanced a.m. and p.m. Recommend she increase Cardura up to 2 mg twice daily, holding Cardura for low blood pressure If blood pressure spikes, would recommend she  take hydralazine 25 mg with repeat hydralazine in 2 hours if blood pressure continues to run high Recommend she continue other outpatient medications including amlodipine 10 in the morning, telmisartan 60 mg in the p.m., propranolol ER 60 in the morning  Chest pain, unspecified type No significant chest pain on today's visit  Gait instability recommend regular walking program Recommend she buy a walker  Aortic atherosclerosis Previously on statin with Zetia Discussed with her today, not interested in restarting her statin at  this time  Adjustment disorder Long discussion with patient and daughter,  Moderate improvement in their social situation, received assistance for rent   Total encounter time more than 30 minutes  Greater than 50% was spent in counseling and coordination of care with the patient    Signed, Julien Nordmann, MD  06/23/2022 4:02 PM    [facility] [address], [place], [place] [address]

## 2022-06-23 ENCOUNTER — Ambulatory Visit: Payer: Medicare Other | Attending: Cardiovascular Disease | Admitting: Cardiovascular Disease

## 2022-06-23 ENCOUNTER — Encounter: Payer: Self-pay | Admitting: Cardiovascular Disease

## 2022-06-23 VITALS — BP 190/66 | HR 67 | Ht 62.0 in | Wt 147.0 lb

## 2022-06-23 DIAGNOSIS — R079 Chest pain, unspecified: Secondary | ICD-10-CM | POA: Diagnosis not present

## 2022-06-23 DIAGNOSIS — I700 Atherosclerosis of aorta: Secondary | ICD-10-CM | POA: Diagnosis not present

## 2022-06-23 DIAGNOSIS — I480 Paroxysmal atrial fibrillation: Secondary | ICD-10-CM | POA: Diagnosis not present

## 2022-06-23 DIAGNOSIS — I10 Essential (primary) hypertension: Secondary | ICD-10-CM

## 2022-06-23 DIAGNOSIS — E785 Hyperlipidemia, unspecified: Secondary | ICD-10-CM

## 2022-06-23 MED ORDER — DOXAZOSIN MESYLATE 2 MG PO TABS: 2.0000 mg | ORAL_TABLET | Freq: Two times a day (BID) | ORAL | 3 refills | Status: DC

## 2022-06-23 MED ORDER — NITROGLYCERIN 0.4 MG SL SUBL: 0.4000 mg | SUBLINGUAL_TABLET | SUBLINGUAL | 3 refills | Status: DC | PRN

## 2022-06-23 MED ORDER — HYDRALAZINE HCL 25 MG PO TABS: 25.0000 mg | ORAL_TABLET | Freq: Two times a day (BID) | ORAL | 3 refills | Status: DC | PRN

## 2022-06-23 NOTE — Patient Instructions (Addendum)
Medication Instructions:  Please increase the doxazosin up to 2 mg twice a day  For pressure >160, take  hydralazine 25 mg x 1 If pressure does not come down after 2 hours, take an additional hydralazine  If you need a refill on your cardiac medications before your next appointment, please call your pharmacy.   Lab work: No new labs needed  Testing/Procedures: No new testing needed  Follow-Up: At [facility], you and your health needs are our priority.  As part of our continuing mission to provide you with exceptional heart care, we have created designated Provider Care Teams.  These Care Teams include your primary Cardiologist (physician) and Advanced Practice Providers (APPs -  Physician Assistants and Nurse Practitioners) who all work together to provide you with the care you need, when you need it.  You will need a follow up appointment in 3 months  Providers on your designated Care Team:   Nicolasa Ducking, NP Eula Listen, PA-C Cadence Fransico Michael, [place]  COVID-19 Vaccine Information can be found at: [email or web address] For questions related to vaccine distribution or appointments, please email [email or web address] or call [phone].

## 2022-06-30 ENCOUNTER — Ambulatory Visit
Admission: RE | Admit: 2022-06-30 | Discharge: 2022-06-30 | Disposition: A | Discharge status: Home / Self Care | Payer: Medicare Other | Source: Ambulatory Visit | Attending: Pulmonary Disease | Admitting: Pulmonary Disease

## 2022-06-30 DIAGNOSIS — R918 Other nonspecific abnormal finding of lung field: Secondary | ICD-10-CM | POA: Diagnosis not present

## 2022-07-08 ENCOUNTER — Encounter: Payer: Self-pay | Admitting: Family Medicine

## 2022-07-08 ENCOUNTER — Ambulatory Visit (INDEPENDENT_AMBULATORY_CARE_PROVIDER_SITE_OTHER): Payer: Medicare Other | Admitting: Family Medicine

## 2022-07-08 VITALS — BP 118/70 | HR 56 | Temp 97.7°F | Ht 62.0 in | Wt 145.2 lb

## 2022-07-08 DIAGNOSIS — R739 Hyperglycemia, unspecified: Secondary | ICD-10-CM | POA: Diagnosis not present

## 2022-07-08 DIAGNOSIS — G250 Essential tremor: Secondary | ICD-10-CM

## 2022-07-08 DIAGNOSIS — I700 Atherosclerosis of aorta: Secondary | ICD-10-CM

## 2022-07-08 DIAGNOSIS — E785 Hyperlipidemia, unspecified: Secondary | ICD-10-CM

## 2022-07-08 DIAGNOSIS — I10 Essential (primary) hypertension: Secondary | ICD-10-CM

## 2022-07-08 DIAGNOSIS — I1A0 Resistant hypertension: Secondary | ICD-10-CM | POA: Diagnosis not present

## 2022-07-08 DIAGNOSIS — R918 Other nonspecific abnormal finding of lung field: Secondary | ICD-10-CM

## 2022-07-08 DIAGNOSIS — I480 Paroxysmal atrial fibrillation: Secondary | ICD-10-CM

## 2022-07-08 DIAGNOSIS — R5383 Other fatigue: Secondary | ICD-10-CM

## 2022-07-08 DIAGNOSIS — E559 Vitamin D deficiency, unspecified: Secondary | ICD-10-CM

## 2022-07-08 DIAGNOSIS — J4520 Mild intermittent asthma, uncomplicated: Secondary | ICD-10-CM

## 2022-07-08 DIAGNOSIS — F5105 Insomnia due to other mental disorder: Secondary | ICD-10-CM

## 2022-07-08 DIAGNOSIS — E611 Iron deficiency: Secondary | ICD-10-CM | POA: Diagnosis not present

## 2022-07-08 NOTE — Patient Instructions (Addendum)
It was a pleasure meeting you today. Thank you for allowing me to take part in your health care.  Our goals for today as we discussed include:  Recommend Shingles vaccine.  This is a 2 dose series and can be given at your local pharmacy.  Please talk to your pharmacist about this.   Schedule Medicare Annual Wellness Visit   We will get some labs today.  If they are abnormal or we need to do something about them, I will call you.  If they are normal, I will send you a message on MyChart (if it is active) or a letter in the mail.  If you don't hear from [place] in 2 weeks, please call the office at the number below.   Check with Neurology if should be taking Aricept and Nemanda.  Follow up with Pulmonology Check on sleep studies       If you have any questions or concerns, please do not hesitate to call the office at [phone].  I look forward to our next visit and until then take care and stay safe.  Regards,   Dana Allan, MD   [facility]

## 2022-07-08 NOTE — Progress Notes (Signed)
SUBJECTIVE:   Chief Complaint  Patient presents with   Transitions Of Care   HPI Patient presents to clinic with daughter to transfer care.  No acute concerns today.  Hypertension/paroxysmal A-fib/ Asymptomatic.  Takes amlodipine 5 mg daily, hydralazine 25 mg twice daily, Micardis 60 mg daily and propranolol 60 mg daily, Eliquis 5 mg twice daily, flecainide 50 mg twice daily, Cardura 2 mg twice daily.  Follows with Dr. Mariah Milling at cardiology.  Osteoporosis. Currently on Fosamax 70 mg daily.  Was initiated 12/2021.  Recent DEXA scan on 12/2021 showing T-score -2.8.  Decreased from initial T-score of -2.6.  Denies any difficulty swallowing medication or jaw pain.  Reported history of celiac disease Recent colonoscopy 2023 showed diverticulosis of entire colon.  2 polyps in ascending colon removed on biopsy.  Normal mucosa, biopsied.  Nonbleeding internal hemorrhoids noted but otherwise normal per GI report.  No evidence of celiac, dysplasia or malignancy on biopsy of duodenum, stomach.  No evidence of colitis or microscopic colitis in colon.  Previous TTG negative.  Alzheimer's Previously on Aricept 5 mg nightly but continue.  Continues to take Namenda 5 mg twice daily.  Follows with Dr. Clelia Croft at [facility].  History of asthma/lung nodules Currently takes Proventil, Trelegy Ellipta.  Denies any worsening shortness of breath.  Follows with pulmonology and is scheduled appointment 04/26.  Sleep disturbance Chronic use of Ambien 5 mg nightly.  Discussed discontinuing medication given increased fatigue during the day.  Would prefer to continue given lack of sleep.  Also discussed risks of medications to include falls, increased confusion, association with cognitive decline. Daughter reports increased stress at home.  Almost evicted.  Has had issues with sister who lives out of state.  This is upset her mother significantly.  Reports feeling tired during the day   PERTINENT PMH /  PSH: Hypertension Osteoporosis Paroxysmal A-fib Aortic atherosclerosis Essential tremor Alzheimer's dementia  OBJECTIVE:  BP 118/70   Pulse (!) 56   Temp 97.7 F (36.5 C)   Ht 5\' 2"  (1.575 m)   Wt 145 lb 3.2 oz (65.9 kg)   SpO2 99%   BMI 26.56 kg/m    Physical Exam Vitals reviewed.  Constitutional:      General: She is not in acute distress.    Appearance: She is not ill-appearing.  HENT:     Head: Normocephalic.     Nose: Nose normal.  Eyes:     Conjunctiva/sclera: Conjunctivae normal.  Neck:     Thyroid: No thyromegaly or thyroid tenderness.  Cardiovascular:     Rate and Rhythm: Normal rate and regular rhythm.     Heart sounds: Normal heart sounds.  Pulmonary:     Effort: Pulmonary effort is normal.     Breath sounds: Normal breath sounds.  Abdominal:     General: Abdomen is flat. Bowel sounds are normal.     Palpations: Abdomen is soft.  Musculoskeletal:        General: Normal range of motion.     Cervical back: Normal range of motion.  Neurological:     Mental Status: She is alert and oriented to person, place, and time. Mental status is at baseline.  Psychiatric:        Mood and Affect: Mood normal.        Behavior: Behavior normal.        Thought Content: Thought content normal.        Judgment: Judgment normal.     ASSESSMENT/PLAN:  Resistant  hypertension Assessment & Plan: Chronic.  Stable and well-controlled on current medications. Managed by cardiology, takes amlodipine 5 mg daily, flecainide 50 mg  twice daily, hydralazine 25 mg twice daily, Micardis 60 mg daily. Follow-up with cardiology as scheduled  Orders: -     Comprehensive metabolic panel  Aortic atherosclerosis (HCC) Assessment & Plan: Chronic. Previously therapy discontinued secondary to side effect of diarrhea and constipation. Not interested in restarting statin therapy.   PAF (paroxysmal atrial fibrillation) (HCC) Assessment & Plan: Chronic.  Asymptomatic.  Rate  controlled. On Aricept 5 mg twice daily, flecainide 50 mg twice daily Follow-up with cardiology.   Orders: -     CBC with Differential/Platelet  Vitamin D deficiency -     VITAMIN D 25 Hydroxy (Vit-D Deficiency, Fractures)  Hyperlipidemia, unspecified hyperlipidemia type Assessment & Plan: Chronic.  Not interested in statin therapy secondary to side effect from Pravachol Check lipids  Orders: -     Lipid panel  Other fatigue -     Vitamin B12 -     TSH -     Iron, TIBC and Ferritin Panel  Mild intermittent asthma without complication Assessment & Plan: Chronic. Has albuterol inhaler, nebulizer and Trelegy Ellipta. Follows with pulmonology and scheduled appointment tomorrow   Lung nodules Assessment & Plan: Chronic.  Asymptomatic.  Follows with pulmonology.   Essential tremor Assessment & Plan: Chronic.  Stable.  Tolerating medication well. 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  History of Alzheimer's. Has been taking Ambien x 4 years. Recommend weaning medication and trial other options.  Would prefer to stay on Ambien for now. Recommend sleep hygiene Consider trazodone given history of depression and anxiety    Hyperglycemia Assessment & Plan: Chronic. Check A1c  Orders: -     Hemoglobin A1c  HCM Recommend shingles vaccine Medicare annual wellness visit due Tetanus vaccine due 12/2022 Pneumonia vaccination completed DEXA scan 12/2021.  Repeat in 2 years given bisphosphonate treatment Colonoscopy 03/23.  No further recommendations for screening.  PDMP reviewed  Return in about 4 weeks (around 08/05/2022) for PCP.  Dana Allan, MD

## 2022-07-09 ENCOUNTER — Encounter: Payer: Self-pay | Admitting: Family Medicine

## 2022-07-09 LAB — COMPREHENSIVE METABOLIC PANEL
ALT: 9 U/L (ref 0–35)
AST: 18 U/L (ref 0–37)
Albumin: 4 g/dL (ref 3.5–5.2)
Alkaline Phosphatase: 79 U/L (ref 39–117)
BUN: 21 mg/dL (ref 6–23)
CO2: 31 mEq/L (ref 19–32)
Calcium: 9.3 mg/dL (ref 8.4–10.5)
Chloride: 100 mEq/L (ref 96–112)
Creatinine, Ser: 1.03 mg/dL (ref 0.40–1.20)
GFR: 51.93 mL/min — ABNORMAL LOW (ref >60.00)
Glucose, Bld: 86 mg/dL (ref 70–99)
Potassium: 4 mEq/L (ref 3.5–5.1)
Sodium: 139 mEq/L (ref 135–145)
Total Bilirubin: 0.8 mg/dL (ref 0.2–1.2)
Total Protein: 6.7 g/dL (ref 6.0–8.3)

## 2022-07-09 LAB — CBC WITH DIFFERENTIAL/PLATELET
Basophils Absolute: 0.1 10*3/uL (ref 0.0–0.1)
Basophils Relative: 1.2 % (ref 0.0–3.0)
Eosinophils Absolute: 0.1 10*3/uL (ref 0.0–0.7)
Eosinophils Relative: 2.7 % (ref 0.0–5.0)
HCT: 36.1 % (ref 36.0–46.0)
Hemoglobin: 12.2 g/dL (ref 12.0–15.0)
Lymphocytes Relative: 27.5 % (ref 12.0–46.0)
Lymphs Abs: 1.4 10*3/uL (ref 0.7–4.0)
MCHC: 33.8 g/dL (ref 30.0–36.0)
MCV: 93.4 fl (ref 78.0–100.0)
Monocytes Absolute: 0.4 10*3/uL (ref 0.1–1.0)
Monocytes Relative: 8.3 % (ref 3.0–12.0)
Neutro Abs: 3.1 10*3/uL (ref 1.4–7.7)
Neutrophils Relative %: 60.3 % (ref 43.0–77.0)
Platelets: 234 10*3/uL (ref 150.0–400.0)
RBC: 3.87 Mil/uL (ref 3.87–5.11)
RDW: 14.2 % (ref 11.5–15.5)
WBC: 5.2 10*3/uL (ref 4.0–10.5)

## 2022-07-09 LAB — TSH: TSH: 2.78 u[IU]/mL (ref 0.35–5.50)

## 2022-07-09 LAB — HEMOGLOBIN A1C: Hgb A1c MFr Bld: 5.9 % (ref 4.6–6.5)

## 2022-07-09 LAB — IRON,TIBC AND FERRITIN PANEL
%SAT: 33 % (calc) (ref 16–45)
Ferritin: 54 ng/mL (ref 16–288)
Iron: 108 ug/dL (ref 45–160)
TIBC: 330 mcg/dL (calc) (ref 250–450)

## 2022-07-09 LAB — LIPID PANEL
Cholesterol: 229 mg/dL — ABNORMAL HIGH (ref 0–200)
HDL: 75.9 mg/dL (ref >39.00)
LDL Cholesterol: 138 mg/dL — ABNORMAL HIGH (ref 0–99)
NonHDL: 153.02
Total CHOL/HDL Ratio: 3
Triglycerides: 75 mg/dL (ref 0.0–149.0)
VLDL: 15 mg/dL (ref 0.0–40.0)

## 2022-07-09 LAB — VITAMIN D 25 HYDROXY (VIT D DEFICIENCY, FRACTURES): VITD: 50.99 ng/mL (ref 30.00–100.00)

## 2022-07-09 LAB — VITAMIN B12: Vitamin B-12: 712 pg/mL (ref 211–911)

## 2022-07-10 ENCOUNTER — Encounter: Payer: Self-pay | Admitting: Adult Health

## 2022-07-10 ENCOUNTER — Ambulatory Visit: Payer: Medicare Other | Admitting: Adult Health

## 2022-07-10 VITALS — BP 110/70 | HR 58 | Temp 97.7°F | Ht 62.0 in | Wt 147.4 lb

## 2022-07-10 DIAGNOSIS — R0683 Snoring: Secondary | ICD-10-CM

## 2022-07-10 DIAGNOSIS — R0602 Shortness of breath: Secondary | ICD-10-CM

## 2022-07-10 MED ORDER — TRELEGY ELLIPTA 200-62.5-25 MCG/ACT IN AEPB: 1.0000 | INHALATION_SPRAY | Freq: Every day | RESPIRATORY_TRACT | 5 refills | Status: DC

## 2022-07-10 NOTE — Assessment & Plan Note (Signed)
Dyspnea appears to be improved after recent exacerbation.  She does have a 2D echo is pending.  Will follow-up on results when available

## 2022-07-10 NOTE — Patient Instructions (Addendum)
Continue on Trelegy 1 puff daily , rinse after use.  Albuterol inhaler or neb As needed   Zytec 10mg  At bedtime  As needed drainage.   Set up for Home sleep study.  Use caution with sedating medications.  Healthy sleep regimen  Do not drive if sleepy .   2 D Echo as planned   Follow up with in 6 weeks to discuss sleep study results and treatment plan (in person or virtual )  Please contact office for sooner follow up if symptoms do not improve or worsen or seek emergency care

## 2022-07-10 NOTE — Assessment & Plan Note (Signed)
Recent asthmatic bronchitic exacerbation now resolved.  Improved symptom management on maximum dose Trelegy.  Continue trigger prevention.  Plan  Patient Instructions  Continue on Trelegy 1 puff daily , rinse after use.  Albuterol inhaler or neb As needed   Zytec 10mg  At bedtime  As needed drainage.   Set up for Home sleep study.  Use caution with sedating medications.  Healthy sleep regimen  Do not drive if sleepy .   2 D Echo as planned   Follow up with in 6 weeks to discuss sleep study results and treatment plan (in person or virtual )  Please contact office for sooner follow up if symptoms do not improve or worsen or seek emergency care

## 2022-07-10 NOTE — Progress Notes (Signed)
@Patient  ID: Gina Frazier, female    DOB: 09-Apr-1945, 77 y.o.   MRN: [id number]  No chief complaint on file.   Referring provider: Dana Allan, MD  HPI: 77 year old female never smoker followed for asthma and lung nodules  TEST/EVENTS :  CT chest November 18, 2021 shows new nodules in the left upper lobe possible infectious versus inflammatory.  Small nodules in the right upper lobe.   PFTs May 23, 2021 showed normal lung function with FEV1 at 94%, ratio 81, FVC 87%, no significant bronchodilator response, DLCO 99%  CT chest June 30, 2022 left upper lobe pulmonary nodules resolved, multiple scattered pulmonary nodules stable dating back to November 2022, largest measuring 6 mm likely benign etiology  07/10/2022 Follow up : Asthma , Lung nodules  Patient presents for a 4-week follow-up visit.  Patient was seen 1 month ago for a asthmatic bronchitic exacerbation.  She was treated with Augmentin and a prednisone taper.  Patient says symptoms did improve but she continued to have some ongoing shortness of breath.  Last visit patient was recommend to increase dose of Trelegy to 200.  She was also set up for a 2D echo that is pending.  This has been set up for May 9.  Overall patient says her breathing is doing some better.  Feels that Trelegy is working well.  She still gets winded with heavy activity.  Taking Zyrtec as well which is helping with allergy symptoms  CT chest 06/30/22 showed resolved LUL nodules and stable scattered nodules . Discussed results .   Does complain of snoring, restless sleep and daytime sleepiness.  Daughter reports very loud snoring, gasping for air and witnessed apneic everts. Wakes up exhausted. Naps several times a day. Falls asleep while watching TV.  No caffeine.  Takes Ambien 5mg  At bedtime  for insomnia .  Has a history of A-fib , difficult to control Hypertension and dementia. No known history of CHF and CVA . Not on any pain meds.    Allergies   Allergen Reactions   Fish Allergy Anaphylaxis, Nausea And Vomiting and Rash   Pravastatin     Diarrhea/constipation per pt      Immunization History  Administered Date(s) Administered   Fluad Quad(high Dose 65+) 11/10/2018, 12/11/2019, 12/05/2020, 11/21/2021   Hepatitis B 03/16/2014, 10/11/2014, 11/25/2014, 07/29/2015   Hepatitis B, ADULT 03/16/2014, 10/11/2014, 11/25/2014, 07/29/2015   Influenza Split 01/05/2011, 12/23/2011, 01/06/2013   Influenza Whole 04/19/2010   Influenza, High Dose Seasonal PF 12/17/2015, 02/12/2017, 12/03/2017   Moderna Covid-19 Vaccine Bivalent Booster 53yrs & up 01/23/2021   Moderna SARS-COV2 Booster Vaccination 02/15/2020   Moderna Sars-Covid-2 Vaccination 04/28/2019, 05/27/2019   Pneumococcal Conjugate-13 09/29/2013, 01/28/2017   Pneumococcal Polysaccharide-23 07/21/2010   Td 04/19/2010   Tdap 01/06/2013   Zoster, Live 07/31/2013    Past Medical History:  Diagnosis Date   Allergy    Anxiety    Asthma    Bell's palsy    left   Chronic back pain    Family history of adverse reaction to anesthesia    pts daughter had severe vomiting 2014    Hip joint pain    left   HLD (hyperlipidemia)    Hypertension    Neuropathy    Osteoarthritis    PAF (paroxysmal atrial fibrillation) (HCC)    a. initially diagnosed 04/2014; b. on Eliquis; c. CHADS2VASc = 3   Palpitations    Wears contact lenses    Wears dentures    partial upper  Tobacco History: Social History   Tobacco Use  Smoking Status Never  Smokeless Tobacco Never   Counseling given: Not Answered   Outpatient Medications Prior to Visit  Medication Sig Dispense Refill   acetaminophen (TYLENOL) 500 MG tablet Take 1,000 mg every 8 (eight) hours as needed by mouth for mild pain.     albuterol (PROVENTIL) (2.5 MG/3ML) 0.083% nebulizer solution Take 3 mLs (2.5 mg total) by nebulization every 6 (six) hours as needed for wheezing or shortness of breath. 150 mL 11   albuterol (VENTOLIN HFA)  108 (90 Base) MCG/ACT inhaler Inhale 1-2 puffs into the lungs every 6 (six) hours as needed for wheezing or shortness of breath. 18 g 12   alendronate (FOSAMAX) 70 MG tablet Take 1 tablet (70 mg total) by mouth every 7 (seven) days. Take with a full glass of water on an empty stomach. 4 tablet 11   amLODipine (NORVASC) 10 MG tablet Take 1/2 pill daily (5 mg). After one week if blood pressure still >140, increase up to 10 mg daily (whole pill) 90 tablet 3   apixaban (ELIQUIS) 5 MG TABS tablet TAKE 1 TABLET BY MOUTH TWICE DAILY 180 tablet 1   Calcium Carbonate-Vitamin D 600-400 MG-UNIT tablet Take by mouth.     Cholecalciferol (VITAMIN D) 2000 units tablet Take 2,000 Units by mouth daily.     donepezil (ARICEPT) 5 MG tablet Take 5 mg by mouth at bedtime.     doxazosin (CARDURA) 2 MG tablet Take 1 tablet (2 mg total) by mouth 2 (two) times daily. 180 tablet 3   flecainide (TAMBOCOR) 50 MG tablet TAKE 1 TABLET(50 MG) BY MOUTH TWICE DAILY 180 tablet 0   hydrALAZINE (APRESOLINE) 25 MG tablet Take 1 tablet (25 mg total) by mouth 2 (two) times daily as needed (For pressure >160, take  hydralazine 25 mg x 1 If pressure does not come down after 2 hours, take an additional hydralazine). 60 tablet 3   hydrocortisone 2.5 % cream Apply topically 2 (two) times daily. Prn right thigh 30 g 0   ipratropium (ATROVENT) 0.06 % nasal spray Place 2 sprays into both nostrils 3 (three) times daily. 15 mL 12   memantine (NAMENDA) 5 MG tablet Take 5 mg by mouth 2 (two) times daily.     methocarbamol (ROBAXIN) 500 MG tablet Take 500 mg by mouth 2 (two) times daily.     Multiple Vitamins-Minerals (MULTIVITAMIN WITH MINERALS) tablet Take 1 tablet by mouth daily.     nitroGLYCERIN (NITROSTAT) 0.4 MG SL tablet Place 1 tablet (0.4 mg total) under the tongue every 5 (five) minutes as needed for chest pain. 25 tablet 3   propranolol ER (INDERAL LA) 60 MG 24 hr capsule TAKE 1 CAPSULE(60 MG) BY MOUTH DAILY 90 capsule 3   sodium  chloride (OCEAN) 0.65 % SOLN nasal spray Place 1-2 sprays into both nostrils daily as needed for congestion. 30 mL 12   telmisartan (MICARDIS) 40 MG tablet TAKE 60 MG(1 AND 1/2 TABLET) NIGHTLY 45 tablet 6   topiramate (TOPAMAX) 50 MG tablet Take 100 mg by mouth 2 (two) times daily.     zolpidem (AMBIEN) 5 MG tablet TAKE 1 TABLET(5 MG) BY MOUTH AT BEDTIME AS NEEDED FOR SLEEP 30 tablet 5   Fluticasone-Umeclidin-Vilant (TRELEGY ELLIPTA) 200-62.5-25 MCG/ACT AEPB Inhale 1 puff into the lungs daily. 28 each 0   ketoconazole (NIZORAL) 2 % cream Apply 1 application. topically daily. (Patient not taking: Reported on 07/10/2022) 30 g 0  levocetirizine (XYZAL) 5 MG tablet Take 0.5-1 tablets (2.5-5 mg total) by mouth at bedtime as needed for allergies. (Patient not taking: Reported on 07/10/2022) 90 tablet 3   Olopatadine HCl (PATADAY) 0.7 % SOLN Apply 1 drop to eye daily. (Patient not taking: Reported on 07/10/2022) 2.5 mL 22   predniSONE (DELTASONE) 10 MG tablet 4 tabs for 2 days, then 3 tabs for 2 days, 2 tabs for 2 days, then 1 tab for 2 days, then stop (Patient not taking: Reported on 07/10/2022) 20 tablet 0   No facility-administered medications prior to visit.     Review of Systems:   Constitutional:   No  weight loss, night sweats,  Fevers, chills, + fatigue, or  lassitude.  HEENT:   No headaches,  Difficulty swallowing,  Tooth/dental problems, or  Sore throat,                No sneezing, itching, ear ache, nasal congestion, post nasal drip,   CV:  No chest pain,  Orthopnea, PND, swelling in lower extremities, anasarca, dizziness, palpitations, syncope.   GI  No heartburn, indigestion, abdominal pain, nausea, vomiting, diarrhea, change in bowel habits, loss of appetite, bloody stools.   Resp:.  No excess mucus, no productive cough,  No non-productive cough,  No coughing up of blood.  No change in color of mucus.  No wheezing.  No chest wall deformity  Skin: no rash or lesions.  GU: no dysuria,  change in color of urine, no urgency or frequency.  No flank pain, no hematuria   MS:  No joint pain or swelling.  No decreased range of motion.  No back pain.    Physical Exam  BP 110/70 (BP Location: Left Arm, Cuff Size: Normal)   Pulse (!) 58   Temp 97.7 F (36.5 C)   Ht 5\' 2"  (1.575 m)   Wt 147 lb 6.4 oz (66.9 kg)   SpO2 100%   BMI 26.96 kg/m   GEN: A/Ox3; pleasant , NAD, well nourished    HEENT:  [place]/AT,  NOSE-clear, THROAT-clear, no lesions, no postnasal drip or exudate noted.   NECK:  Supple w/ fair ROM; no JVD; normal carotid impulses w/o bruits; no thyromegaly or nodules palpated; no lymphadenopathy.    RESP  Clear  P & A; w/o, wheezes/ rales/ or rhonchi. no accessory muscle use, no dullness to percussion  CARD:  RRR, no m/r/g, no peripheral edema, pulses intact, no cyanosis or clubbing.  GI:   Soft & nt; nml bowel sounds; no organomegaly or masses detected.   Musco: Warm bil, no deformities or joint swelling noted.   Neuro: alert, no focal deficits noted.    Skin: Warm, no lesions or rashes    Lab Results:  CBC    Component Value Date/Time   WBC 5.2 07/08/2022 1402   RBC 3.87 07/08/2022 1402   HGB 12.2 07/08/2022 1402   HCT 36.1 07/08/2022 1402   PLT 234.0 07/08/2022 1402   MCV 93.4 07/08/2022 1402   MCH 31.2 04/22/2021 1043   MCHC 33.8 07/08/2022 1402   RDW 14.2 07/08/2022 1402   LYMPHSABS 1.4 07/08/2022 1402   MONOABS 0.4 07/08/2022 1402   EOSABS 0.1 07/08/2022 1402   BASOSABS 0.1 07/08/2022 1402    BMET    Component Value Date/Time   NA 139 07/08/2022 1402   K 4.0 07/08/2022 1402   CL 100 07/08/2022 1402   CO2 31 07/08/2022 1402   GLUCOSE 86 07/08/2022 1402   BUN  21 07/08/2022 1402   CREATININE 1.03 07/08/2022 1402   CREATININE 0.97 01/10/2021 1430   CALCIUM 9.3 07/08/2022 1402   GFRNONAA >60 07/23/2019 0114   GFRAA >60 07/23/2019 0114    BNP No results found for: "BNP"  ProBNP No results found for: "PROBNP"  Imaging: CT  CHEST WO CONTRAST  Result Date: 07/03/2022 CLINICAL DATA:  77 year old female with history of multiple pulmonary nodules. Follow-up study. EXAM: CT CHEST WITHOUT CONTRAST TECHNIQUE: Multidetector CT imaging of the chest was performed following the standard protocol without IV contrast. RADIATION DOSE REDUCTION: This exam was performed according to the departmental dose-optimization program which includes automated exposure control, adjustment of the mA and/or kV according to patient size and/or use of iterative reconstruction technique. COMPARISON:  Chest CT 11/18/2021. FINDINGS: Cardiovascular: Heart size is normal. There is no significant pericardial fluid, thickening or pericardial calcification. Atherosclerotic calcifications are noted in the thoracic aorta. No definite coronary artery calcifications are noted. Mediastinum/Nodes: No pathologically enlarged mediastinal or hilar lymph nodes. Please note that accurate exclusion of hilar adenopathy is limited on noncontrast CT scans. Esophagus is unremarkable in appearance. No axillary lymphadenopathy. Lungs/Pleura: Tiny clustered left upper lobe pulmonary nodules on the prior study have completely resolved, indicative of a benign infectious or inflammatory process on the prior examination. Multiple other small pulmonary nodules scattered throughout the lungs bilaterally appear generally stable compared to prior examinations dating back to 01/29/2021 and are considered benign, requiring no future imaging follow-up. The largest of these measures 5 x 6 mm in the periphery of the left upper lobe (axial image 23 of series 3). The exception to this is a 4 mm nodule in the posterior right upper lobe (axial image 42 of series 3), which is new compared to the prior study from 2022, but stable compared to the recent prior examination from 11/18/2021, also likely benign. No other new suspicious appearing pulmonary nodules or masses are noted. No acute consolidative airspace  disease. No pleural effusions. Upper Abdomen: Status post cholecystectomy. Musculoskeletal: There are no aggressive appearing lytic or blastic lesions noted in the visualized portions of the skeleton. IMPRESSION: 1. Small pulmonary nodules, as above. Some of these have resolved compared to the prior study, while the other nodules are stable, as detailed above. These are considered likely benign. No follow-up needed if patient is low-risk (and has no known or suspected primary neoplasm). Non-contrast chest CT can be considered in 12 months if patient is high-risk. This recommendation follows the consensus statement: Guidelines for Management of Incidental Pulmonary Nodules Detected on CT Images: From the Fleischner Society 2017; Radiology 2017; 284:228-243. 2. Aortic atherosclerosis. Aortic Atherosclerosis (ICD10-I70.0). Electronically Signed   By: Trudie Reed M.D.   On: 07/03/2022 10:41          No data to display          Lab Results  Component Value Date   NITRICOXIDE 13 06/18/2022        Assessment & Plan:   Asthmatic bronchitis with exacerbation Recent asthmatic bronchitic exacerbation now resolved.  Improved symptom management on maximum dose Trelegy.  Continue trigger prevention.  Plan  Patient Instructions  Continue on Trelegy 1 puff daily , rinse after use.  Albuterol inhaler or neb As needed   Zytec 10mg  At bedtime  As needed drainage.   Set up for Home sleep study.  Use caution with sedating medications.  Healthy sleep regimen  Do not drive if sleepy .   2 D Echo as planned  Follow up with in 6 weeks to discuss sleep study results and treatment plan (in person or virtual )  Please contact office for sooner follow up if symptoms do not improve or worsen or seek emergency care     Lung nodules CT chest shows resolved left upper lobe nodules.  Stable scattered pulmonary nodules maximum 6 mm.  Patient is a never smoker.  Most consistent with a benign etiology.   No further imaging at this time.  Snoring Loud snoring, gasping for air during sleep, witnessed apneic events, history of A-fib and difficult to control hypertension all suspicious for underlying sleep apnea.  Set patient up for home sleep study.  .- discussed how weight can impact sleep and risk for sleep disordered breathing - discussed options to assist with weight loss: combination of diet modification, cardiovascular and strength training exercises   - had an extensive discussion regarding the adverse health consequences related to untreated sleep disordered breathing - specifically discussed the risks for hypertension, coronary artery disease, cardiac dysrhythmias, cerebrovascular disease, and diabetes - lifestyle modification discussed   - discussed how sleep disruption can increase risk of accidents, particularly when driving - safe driving practices were discussed    Plan  . Patient Instructions  Continue on Trelegy 1 puff daily , rinse after use.  Albuterol inhaler or neb As needed   Zytec 10mg  At bedtime  As needed drainage.   Set up for Home sleep study.  Use caution with sedating medications.  Healthy sleep regimen  Do not drive if sleepy .   2 D Echo as planned   Follow up with in 6 weeks to discuss sleep study results and treatment plan (in person or virtual )  Please contact office for sooner follow up if symptoms do not improve or worsen or seek emergency care     Shortness of breath Dyspnea appears to be improved after recent exacerbation.  She does have a 2D echo is pending.  Will follow-up on results when available     Rubye Oaks, NP 07/10/2022

## 2022-07-10 NOTE — Assessment & Plan Note (Signed)
Loud snoring, gasping for air during sleep, witnessed apneic events, history of A-fib and difficult to control hypertension all suspicious for underlying sleep apnea.  Set patient up for home sleep study.  .- discussed how weight can impact sleep and risk for sleep disordered breathing - discussed options to assist with weight loss: combination of diet modification, cardiovascular and strength training exercises   - had an extensive discussion regarding the adverse health consequences related to untreated sleep disordered breathing - specifically discussed the risks for hypertension, coronary artery disease, cardiac dysrhythmias, cerebrovascular disease, and diabetes - lifestyle modification discussed   - discussed how sleep disruption can increase risk of accidents, particularly when driving - safe driving practices were discussed    Plan  . Patient Instructions  Continue on Trelegy 1 puff daily , rinse after use.  Albuterol inhaler or neb As needed   Zytec 10mg  At bedtime  As needed drainage.   Set up for Home sleep study.  Use caution with sedating medications.  Healthy sleep regimen  Do not drive if sleepy .   2 D Echo as planned   Follow up with in 6 weeks to discuss sleep study results and treatment plan (in person or virtual )  Please contact office for sooner follow up if symptoms do not improve or worsen or seek emergency care

## 2022-07-10 NOTE — Assessment & Plan Note (Signed)
CT chest shows resolved left upper lobe nodules.  Stable scattered pulmonary nodules maximum 6 mm.  Patient is a never smoker.  Most consistent with a benign etiology.  No further imaging at this time.

## 2022-07-10 NOTE — Progress Notes (Signed)
Agree with the details of the visit as noted by Tammy Parrett, NP.  C. Laura Curren Mohrmann, MD [facility] 

## 2022-07-18 ENCOUNTER — Other Ambulatory Visit: Payer: Self-pay | Admitting: Cardiovascular Disease

## 2022-07-19 ENCOUNTER — Encounter: Payer: Self-pay | Admitting: Family Medicine

## 2022-07-19 DIAGNOSIS — R5383 Other fatigue: Secondary | ICD-10-CM | POA: Insufficient documentation

## 2022-07-19 NOTE — Progress Notes (Incomplete)
   SUBJECTIVE:   Chief Complaint  Patient presents with  . Transitions Of Care   HPI Patient presents to clinic to transfer care.  No acute concerns today.  Hypertension Asymptomatic.  Takes amlodipine 5 mg daily, hydralazine 25 mg twice daily, Micardis 60 mg daily and propranolol 60 mg daily.  Follows with Dr. Mariah Milling at cardiology.  Osteoporosis.   PERTINENT PMH / PSH: ***  OBJECTIVE:  BP 118/70   Pulse (!) 56   Temp 97.7 F (36.5 C)   Ht 5\' 2"  (1.575 m)   Wt 145 lb 3.2 oz (65.9 kg)   SpO2 99%   BMI 26.56 kg/m    Physical Exam  ASSESSMENT/PLAN:  Essential hypertension -     Comprehensive metabolic panel  Vitamin D deficiency -     VITAMIN D 25 Hydroxy (Vit-D Deficiency, Fractures)  Hyperlipidemia, unspecified hyperlipidemia type -     Lipid panel  Other fatigue -     Hemoglobin A1c -     CBC with Differential/Platelet -     Vitamin B12 -     TSH -     Iron, TIBC and Ferritin Panel   PDMP reviewed***  No follow-ups on file.  Dana Allan, MD

## 2022-07-19 NOTE — Assessment & Plan Note (Signed)
Chronic.  Asymptomatic.  Rate controlled. On Aricept 5 mg twice daily, flecainide 50 mg twice daily Follow-up with cardiology.

## 2022-07-19 NOTE — Assessment & Plan Note (Signed)
Chronic Check A1c 

## 2022-07-19 NOTE — Assessment & Plan Note (Signed)
Chronic.  History of Alzheimer's. Has been taking Ambien x 4 years. Recommend weaning medication and trial other options.  Would prefer to stay on Ambien for now. Recommend sleep hygiene Consider trazodone given history of depression and anxiety

## 2022-07-19 NOTE — Assessment & Plan Note (Signed)
Chronic.  Asymptomatic.  Follows with pulmonology.

## 2022-07-19 NOTE — Assessment & Plan Note (Signed)
Chronic. Has albuterol inhaler, nebulizer and Trelegy Ellipta. Follows with pulmonology and scheduled appointment tomorrow

## 2022-07-19 NOTE — Assessment & Plan Note (Signed)
Chronic.  Not interested in statin therapy secondary to side effect from Pravachol Check lipids

## 2022-07-19 NOTE — Assessment & Plan Note (Signed)
Chronic.  Stable and well-controlled on current medications. Managed by cardiology, takes amlodipine 5 mg daily, flecainide 50 mg  twice daily, hydralazine 25 mg twice daily, Micardis 60 mg daily. Follow-up with cardiology as scheduled

## 2022-07-19 NOTE — Assessment & Plan Note (Signed)
Chronic.  Stable.  Tolerating medication well. Currently on propranolol 60 mg daily Follows with cardiology

## 2022-07-19 NOTE — Assessment & Plan Note (Signed)
Chronic. Previously therapy discontinued secondary to side effect of diarrhea and constipation. Not interested in restarting statin therapy.

## 2022-07-20 ENCOUNTER — Telehealth: Payer: Self-pay | Admitting: Family Medicine

## 2022-07-20 NOTE — Telephone Encounter (Signed)
Pt daughter called stating she is very upset with the provider and would like change doctors. Pt daughter stated that the provider told her mom that she had high choloestrol and need to eat fish and stop smoking. 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

## 2022-07-20 NOTE — Telephone Encounter (Signed)
Spoke with patient daughter and advised we apologized but mothers chart did not prior to call  have Fish as an allergy causing anaphylaxis but this has now been updated. Patient daughter was still dissatisfied and wanted patient provider changed so gave choice and patient daughter chose Evelene Croon FNP. Appt scheduled.

## 2022-07-20 NOTE — Telephone Encounter (Signed)
Comments mentioned come from recent lab results: 07/09/22 Total cholesterol was high at 229 (nml is <200) and LDL (bad) cholesterol was 161 which is also elevated (nml is <99).  You can decrease these numbers by: -limiting your intake of processed foods and foods high in saturated fats. -increasing your physical activity -increasing your intake of vegetables, healthy fish (bluefin tuna, wild salmon, and sardines), whole grains and fiber rich foods .   -You can also use extra virgin olive oil instead of butter. -If you smoke, quitting can decrease LDL cholesterol and raise HDL cholesterol. All other blood work acceptable  Patient's daughter sent MyChart message questioning note. Dr. Clent Ridges replied: Gina Frazier, I apologize for the error in my message and please disregard.  The allergy list will be updated to reflect anaphylaxis. Thank you for updating me and again sorry for the mistake.    Please review, thanks!

## 2022-07-22 DIAGNOSIS — G250 Essential tremor: Secondary | ICD-10-CM | POA: Diagnosis not present

## 2022-07-22 DIAGNOSIS — M79621 Pain in right upper arm: Secondary | ICD-10-CM | POA: Diagnosis not present

## 2022-07-22 DIAGNOSIS — W1800XA Striking against unspecified object with subsequent fall, initial encounter: Secondary | ICD-10-CM | POA: Diagnosis not present

## 2022-07-22 DIAGNOSIS — G309 Alzheimer's disease, unspecified: Secondary | ICD-10-CM | POA: Diagnosis not present

## 2022-07-22 DIAGNOSIS — F0150 Vascular dementia without behavioral disturbance: Secondary | ICD-10-CM | POA: Diagnosis not present

## 2022-07-23 ENCOUNTER — Other Ambulatory Visit: Payer: Medicare Other

## 2022-08-04 DIAGNOSIS — G4730 Sleep apnea, unspecified: Secondary | ICD-10-CM | POA: Diagnosis not present

## 2022-08-13 ENCOUNTER — Encounter: Payer: Self-pay | Admitting: Nurse Practitioner

## 2022-08-13 ENCOUNTER — Telehealth: Payer: Self-pay

## 2022-08-13 ENCOUNTER — Ambulatory Visit (INDEPENDENT_AMBULATORY_CARE_PROVIDER_SITE_OTHER): Payer: Medicare Other | Admitting: Nurse Practitioner

## 2022-08-13 ENCOUNTER — Ambulatory Visit: Payer: Medicare Other | Admitting: Family Medicine

## 2022-08-13 VITALS — BP 130/58 | HR 60 | Temp 97.6°F | Ht 62.0 in | Wt 145.0 lb

## 2022-08-13 DIAGNOSIS — R531 Weakness: Secondary | ICD-10-CM | POA: Diagnosis not present

## 2022-08-13 DIAGNOSIS — I1A0 Resistant hypertension: Secondary | ICD-10-CM | POA: Diagnosis not present

## 2022-08-13 DIAGNOSIS — M810 Age-related osteoporosis without current pathological fracture: Secondary | ICD-10-CM

## 2022-08-13 DIAGNOSIS — G479 Sleep disorder, unspecified: Secondary | ICD-10-CM

## 2022-08-13 DIAGNOSIS — G309 Alzheimer's disease, unspecified: Secondary | ICD-10-CM | POA: Diagnosis not present

## 2022-08-13 DIAGNOSIS — F02A4 Dementia in other diseases classified elsewhere, mild, with anxiety: Secondary | ICD-10-CM

## 2022-08-13 DIAGNOSIS — F321 Major depressive disorder, single episode, moderate: Secondary | ICD-10-CM

## 2022-08-13 MED ORDER — TRAZODONE HCL 50 MG PO TABS: 25.0000 mg | ORAL_TABLET | Freq: Every evening | ORAL | 3 refills | Status: DC | PRN

## 2022-08-13 NOTE — Telephone Encounter (Signed)
Patient states she saw Kara Dies, NP, today and in the check-out notes we have a 56-month follow-up with Kara Dies, NP, which patient states is correct.  In patient's appointment desk, there is no note that patient has had a TOC appointment with Kara Dies, NP.  Please let us know if patient has transferred her care to Kara Dies, NP.

## 2022-08-13 NOTE — Patient Instructions (Signed)
Discontinued  Ambien and take trazodone.

## 2022-08-13 NOTE — Telephone Encounter (Signed)
Yes today was her transfer of care appt with Kara Dies, NP.

## 2022-08-13 NOTE — Progress Notes (Signed)
Established Patient Office Visit  Subjective:  Patient ID: Gina Frazier, female    DOB: April 08, 1945  Age: 77 y.o. MRN: [id number]  CC:  Chief Complaint  Patient presents with   Transfer of Care    HPI  Gina Frazier presents for transition of care. She is accompanied by her daughter Gina Frazier.   She has history of mixed Alzheimer's and vascular dementia, hypertension, hyperlipidemia, paroxysmal A-fib, aortic lateral sclerosis, peripheral neuropathy, benign essential tremors, chronic back pain and spasm, sleep disturbances, and osteoporosis.  She is followed by cardiologist and neurologist.  Patient state that she is off balance and she is not using any assistive device at present. Patient has one fall a month ago in the bathroom and has had 3 falls in past in 6 months.  Patient and her daughter states that the patient  is more forgetful.  Stress at home due to financial issues.  Patient and her daughter wanted to change the Neurologist from [facility]  to Neurologist at [facility].  She also requested a referral to the endocrinologist Dr. Percell Belt for Osteoporosis .    HPI   Past Medical History:  Diagnosis Date   Allergy    Anxiety    Asthma    Bell's palsy    left   Chronic back pain    Family history of adverse reaction to anesthesia    pts daughter had severe vomiting 2014    Hip joint pain    left   HLD (hyperlipidemia)    Hypertension    Neuropathy    Osteoarthritis    PAF (paroxysmal atrial fibrillation) (HCC)    a. initially diagnosed 04/2014; b. on Eliquis; c. CHADS2VASc = 3   Palpitations    Wears contact lenses    Wears dentures    partial upper    Past Surgical History:  Procedure Laterality Date   CHOLECYSTECTOMY     COLONOSCOPY WITH PROPOFOL N/A 11/05/2014   Procedure: COLONOSCOPY WITH PROPOFOL;  Surgeon: Midge Minium, MD;  Location: [facility];  Service: Endoscopy;  Laterality: N/A;   COLONOSCOPY WITH PROPOFOL N/A  06/05/2021   Procedure: COLONOSCOPY WITH PROPOFOL;  Surgeon: Jaynie Collins, DO;  Location: [facility] END[facility]COPY;  Service: Gastroenterology;  Laterality: N/A;  ELIQUIS   ESOPHAGOGASTRODUODENOSCOPY (EGD) WITH PROPOFOL N/A 06/05/2021   Procedure: ESOPHAGOGASTRODUODENOSCOPY (EGD) WITH PROPOFOL;  Surgeon: Jaynie Collins, DO;  Location: [facility] ENDOSCOPY;  Service: Gastroenterology;  Laterality: N/A;   JOINT REPLACEMENT     left knee x 2 04/2024 and 01/2017 Dr. 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   Procedure: TOTAL KNEE ARTHROPLASTY;  Surgeon: Kennedy Bucker, MD;  Location: [facility] OR[facility];  Service: Orthopedics;  Laterality: Left;   TOTAL KNEE REVISION Left 02/11/2017   Procedure: TOTAL KNEE REVISION, POLYETHELENE EXCHANGE;  Surgeon: Kennedy Bucker, MD;  Location: [facility];  Service: Orthopedics;  Laterality: Left;    Family History  Problem Relation Age of Onset   Heart disease Mother    Hypertension Mother    Mental illness Mother    Diabetes Mother    Heart disease Father    Hypertension Sister    Depression Sister    Diabetes Sister    Breast cancer Sister 30   Bladder Cancer Sister  Heart disease Brother    Hypertension Brother    Arthritis Daughter    GER disease Daughter    Hypothyroidism Daughter    Lupus Daughter    Depression Daughter    Cancer Other        colon cancer, breast cancer    Heart disease Other    Breast cancer Other    Depression Other     Social History   Socioeconomic History   Marital status: Divorced    Spouse name: Not on file   Number of children: 2   Years of education: Not on file   Highest education level: Not on file  Occupational History   Not on file  Tobacco Use   Smoking status: Never   Smokeless tobacco:  Never  Vaping Use   Vaping Use: Never used  Substance and Sexual Activity   Alcohol use: No   Drug use: No   Sexual activity: Never  Other Topics Concern   Not on file  Social History Narrative   2 daughters 1 lives [place] the other [place] pt visits both    Lives with daughter in [place] and grand daughter       Social Determinants of Health   Financial Resource Strain: Low Risk  (07/30/2021)   Overall Financial Resource Strain (CARDIA)    Difficulty of Paying Living Expenses: Not hard at all  Food Insecurity: Food Insecurity Present (06/05/2022)   Hunger Vital Sign    Worried About Running Out of Food in the Last Year: Often true    Ran Out of Food in the Last Year: Often true  Transportation Needs: No Transportation Needs (06/05/2022)   PRAPARE - Administrator, Civil Service (Medical): No    Lack of Transportation (Non-Medical): No  Physical Activity: Inactive (12/08/2017)   Exercise Vital Sign    Days of Exercise per Week: 0 days    Minutes of Exercise per Session: 0 min  Stress: No Stress Concern Present (07/30/2021)   [employer] of Occupational Health - Occupational Stress Questionnaire    Feeling of Stress : Not at all  Social Connections: Unknown (07/30/2021)   Social Connection and Isolation Panel [NHANES]    Frequency of Communication with Friends and Family: Not on file    Frequency of Social Gatherings with Friends and Family: Not on file    Attends Religious Services: Never    Active Member of Clubs or Organizations: No    Attends Banker Meetings: Never    Marital Status: Divorced  Catering manager Violence: Not At Risk (07/30/2021)   Humiliation, Afraid, Rape, and Kick questionnaire    Fear of Current or Ex-Partner: No    Emotionally Abused: No    Physically Abused: No    Sexually Abused: No     Outpatient Medications Prior to Visit  Medication Sig Dispense Refill   acetaminophen (TYLENOL) 500 MG tablet Take 1,000 mg  every 8 (eight) hours as needed by mouth for mild pain.     albuterol (PROVENTIL) (2.5 MG/3ML) 0.083% nebulizer solution Take 3 mLs (2.5 mg total) by nebulization every 6 (six) hours as needed for wheezing or shortness of breath. 150 mL 11   albuterol (VENTOLIN HFA) 108 (90 Base) MCG/ACT inhaler Inhale 1-2 puffs into the lungs every 6 (six) hours as needed for wheezing or shortness of breath. 18 g 12   alendronate (FOSAMAX) 70 MG tablet Take 1 tablet (70 mg total) by mouth every 7 (seven) days.  Take with a full glass of water on an empty stomach. 4 tablet 11   amLODipine (NORVASC) 10 MG tablet Take 1/2 pill daily (5 mg). After one week if blood pressure still >140, increase up to 10 mg daily (whole pill) 90 tablet 3   apixaban (ELIQUIS) 5 MG TABS tablet TAKE 1 TABLET BY MOUTH TWICE DAILY 180 tablet 1   Calcium Carbonate-Vitamin D 600-400 MG-UNIT tablet Take by mouth.     Cholecalciferol (VITAMIN D) 2000 units tablet Take 2,000 Units by mouth daily.     doxazosin (CARDURA) 2 MG tablet Take 1 tablet (2 mg total) by mouth 2 (two) times daily. 180 tablet 3   flecainide (TAMBOCOR) 50 MG tablet TAKE 1 TABLET(50 MG) BY MOUTH TWICE DAILY 180 tablet 0   Fluticasone-Umeclidin-Vilant (TRELEGY ELLIPTA) 200-62.5-25 MCG/ACT AEPB Inhale 1 puff into the lungs daily. 1 each 5   hydrALAZINE (APRESOLINE) 25 MG tablet Take 1 tablet (25 mg total) by mouth 2 (two) times daily as needed (For pressure >160, take  hydralazine 25 mg x 1 If pressure does not come down after 2 hours, take an additional hydralazine). 60 tablet 3   ipratropium (ATROVENT) 0.06 % nasal spray Place 2 sprays into both nostrils 3 (three) times daily. 15 mL 12   memantine (NAMENDA) 5 MG tablet Take 5 mg by mouth 2 (two) times daily.     methocarbamol (ROBAXIN) 500 MG tablet Take 500 mg by mouth 2 (two) times daily.     Multiple Vitamins-Minerals (MULTIVITAMIN WITH MINERALS) tablet Take 1 tablet by mouth daily.     nitroGLYCERIN (NITROSTAT) 0.4 MG SL  tablet Place 1 tablet (0.4 mg total) under the tongue every 5 (five) minutes as needed for chest pain. 25 tablet 3   propranolol ER (INDERAL LA) 60 MG 24 hr capsule TAKE 1 CAPSULE(60 MG) BY MOUTH DAILY 90 capsule 3   sodium chloride (OCEAN) 0.65 % SOLN nasal spray Place 1-2 sprays into both nostrils daily as needed for congestion. 30 mL 12   telmisartan (MICARDIS) 40 MG tablet TAKE 60 MG(1 AND 1/2 TABLET) NIGHTLY 45 tablet 6   zolpidem (AMBIEN) 5 MG tablet TAKE 1 TABLET(5 MG) BY MOUTH AT BEDTIME AS NEEDED FOR SLEEP 30 tablet 5   No facility-administered medications prior to visit.    Allergies  Allergen Reactions   Fish Allergy Anaphylaxis, Nausea And Vomiting and Rash   Pravastatin     Diarrhea/constipation per pt      ROS Review of Systems  Constitutional: Negative.   HENT: Negative.    Eyes: Negative.   Respiratory:  Negative for cough and chest tightness.   Cardiovascular:  Negative for leg swelling.  Gastrointestinal: Negative.   Genitourinary: Negative.   Musculoskeletal:  Positive for back pain.  Skin: Negative.   Neurological:  Positive for tremors.  Psychiatric/Behavioral: Negative.        Objective:    Physical Exam Constitutional:      Appearance: Normal appearance. She is obese.  HENT:     Head: Normocephalic.     Right Ear: Tympanic membrane normal.     Left Ear: Tympanic membrane normal.     Nose: Nose normal.     Mouth/Throat:     Mouth: Mucous membranes are moist.     Pharynx: Oropharynx is clear.  Eyes:     Extraocular Movements: Extraocular movements intact.     Conjunctiva/sclera: Conjunctivae normal.     Pupils: Pupils are equal, round, and reactive to light.  Cardiovascular:  Rate and Rhythm: Normal rate and regular rhythm.     Pulses: Normal pulses.     Heart sounds: Normal heart sounds.  Pulmonary:     Effort: Pulmonary effort is normal. No respiratory distress.     Breath sounds: Normal breath sounds. No rhonchi.  Abdominal:      General: Bowel sounds are normal.     Palpations: Abdomen is soft. There is no mass.     Tenderness: There is no abdominal tenderness.     Hernia: No hernia is present.  Musculoskeletal:        General: Normal range of motion.     Cervical back: Neck supple. No tenderness.  Skin:    General: Skin is warm.     Capillary Refill: Capillary refill takes less than 2 seconds.  Neurological:     General: No focal deficit present.     Mental Status: She is alert. Mental status is at baseline.  Psychiatric:        Mood and Affect: Mood normal.        Behavior: Behavior normal.        Thought Content: Thought content normal.        Judgment: Judgment normal.     BP (!) 130/58   Pulse 60   Temp 97.6 F (36.4 C) (Oral)   Ht 5\' 2"  (1.575 m)   Wt 145 lb (65.8 kg)   SpO2 98%   BMI 26.52 kg/m  Wt Readings from Last 3 Encounters:  08/13/22 145 lb (65.8 kg)  07/10/22 147 lb 6.4 oz (66.9 kg)  07/08/22 145 lb 3.2 oz (65.9 kg)     Health Maintenance  Topic Date Due   Zoster Vaccines- Shingrix (1 of 2) Never done   COVID-19 Vaccine (4 - 2023-24 season) 11/14/2021   Medicare Annual Wellness (AWV)  07/31/2022   INFLUENZA VACCINE  10/15/2022   DTaP/Tdap/Td (3 - Td or Tdap) 01/07/2023   Pneumonia Vaccine 40+ Years old  Completed   DEXA SCAN  Completed   Hepatitis C Screening  Completed   HPV VACCINES  Aged Out   Colonoscopy  Discontinued    There are no preventive care reminders to display for this patient.  Lab Results  Component Value Date   TSH 2.78 07/08/2022   Lab Results  Component Value Date   WBC 5.2 07/08/2022   HGB 12.2 07/08/2022   HCT 36.1 07/08/2022   MCV 93.4 07/08/2022   PLT 234.0 07/08/2022   Lab Results  Component Value Date   NA 139 07/08/2022   K 4.0 07/08/2022   CO2 31 07/08/2022   GLUCOSE 86 07/08/2022   BUN 21 07/08/2022   CREATININE 1.03 07/08/2022   BILITOT 0.8 07/08/2022   ALKPHOS 79 07/08/2022   AST 18 07/08/2022   ALT 9 07/08/2022   PROT  6.7 07/08/2022   ALBUMIN 4.0 07/08/2022   CALCIUM 9.3 07/08/2022   ANIONGAP 7 07/23/2019   GFR 51.93 (L) 07/08/2022   Lab Results  Component Value Date   CHOL 229 (H) 07/08/2022   Lab Results  Component Value Date   HDL 75.90 07/08/2022   Lab Results  Component Value Date   LDLCALC 138 (H) 07/08/2022   Lab Results  Component Value Date   TRIG 75.0 07/08/2022   Lab Results  Component Value Date   CHOLHDL 3 07/08/2022   Lab Results  Component Value Date   HGBA1C 5.9 07/08/2022      Assessment & Plan:  Mild  Alzheimer's dementia with anxiety, unspecified timing of dementia onset [facility]) Assessment & Plan: Continue Namenda twice a day. Advised patient to participate in challenging activities like crossword puzzles. Advised to perform exercises or walking and social engagements. Followed by neurologist   Orders: -     Ambulatory referral to Neurology -     For home use only DME 4 wheeled rolling walker with seat  Weakness Assessment & Plan: Advised patient to use assistive devices for ambulating to prevent fall. Prescription  for sitting walker provided.  Orders: -     For home use only DME 4 wheeled rolling walker with seat  Osteoporosis, unspecified osteoporosis type, unspecified pathological fracture presence Assessment & Plan: Continue Fosamax. Referral placed for endocrinology as requested by the patient.  Orders: -     Ambulatory referral to Endocrinology  Resistant hypertension Assessment & Plan: Patient BP  Vitals:   08/13/22 0950  BP: (!) 130/58    in the office Advised pt to follow a low sodium and heart healthy diet. Continue the current medication regimen. Followed by cardiologist.    Depression, major, single episode, moderate (HCC) Assessment & Plan: PHQ-9 score 10 Started on trazodone.   Sleep disturbances Assessment & Plan: Discontinued Ambien. Started on trazodone advised patient to start with 25 mg and increase up to 50 mg  if no improvement seen on 25 mg.   Other orders -     traZODone HCl; Take 0.5-1 tablets (25-50 mg total) by mouth at bedtime as needed for sleep.  Dispense: 30 tablet; Refill: 3    Follow-up: Return in about 6 months (around 02/13/2023), or if symptoms worsen or fail to improve.   Kara Dies, NP

## 2022-08-14 ENCOUNTER — Encounter: Payer: Self-pay | Admitting: Nurse Practitioner

## 2022-08-14 DIAGNOSIS — G479 Sleep disorder, unspecified: Secondary | ICD-10-CM | POA: Insufficient documentation

## 2022-08-14 NOTE — Assessment & Plan Note (Addendum)
Continue Namenda twice a day. Advised patient to participate in challenging activities like crossword puzzles. Advised to perform exercises or walking and social engagements. Followed by neurologist. Referral placed for neurologist at [facility] patient requested.   Advised patient to contact the neurologist office for transfer of care.

## 2022-08-14 NOTE — Assessment & Plan Note (Signed)
PHQ-9 score 10 Started on trazodone.

## 2022-08-14 NOTE — Assessment & Plan Note (Signed)
Discontinued Ambien. Started on trazodone advised patient to start with 25 mg and increase up to 50 mg if no improvement seen on 25 mg.

## 2022-08-14 NOTE — Assessment & Plan Note (Signed)
Patient BP  Vitals:   08/13/22 0950  BP: (!) 130/58    in the office Advised pt to follow a low sodium and heart healthy diet. Continue the current medication regimen. Followed by cardiologist.

## 2022-08-14 NOTE — Assessment & Plan Note (Signed)
Continue Fosamax. Referral placed for endocrinology as requested by the patient.

## 2022-08-14 NOTE — Assessment & Plan Note (Signed)
Advised patient to use assistive devices for ambulating to prevent fall. Prescription  for sitting walker provided.

## 2022-08-18 ENCOUNTER — Ambulatory Visit (INDEPENDENT_AMBULATORY_CARE_PROVIDER_SITE_OTHER): Payer: Medicare Other

## 2022-08-18 DIAGNOSIS — G4733 Obstructive sleep apnea (adult) (pediatric): Secondary | ICD-10-CM

## 2022-08-18 DIAGNOSIS — R0683 Snoring: Secondary | ICD-10-CM

## 2022-08-21 ENCOUNTER — Telehealth: Payer: Self-pay

## 2022-08-21 NOTE — Telephone Encounter (Signed)
Lm for patient to ask if she has had HST.

## 2022-08-24 ENCOUNTER — Encounter: Payer: Self-pay | Admitting: Adult Health

## 2022-08-24 ENCOUNTER — Ambulatory Visit: Payer: Medicare Other | Admitting: Adult Health

## 2022-08-24 VITALS — BP 108/58 | HR 63 | Ht 62.0 in | Wt 145.2 lb

## 2022-08-24 DIAGNOSIS — G4733 Obstructive sleep apnea (adult) (pediatric): Secondary | ICD-10-CM

## 2022-08-24 DIAGNOSIS — J45909 Unspecified asthma, uncomplicated: Secondary | ICD-10-CM | POA: Insufficient documentation

## 2022-08-24 DIAGNOSIS — J4530 Mild persistent asthma, uncomplicated: Secondary | ICD-10-CM

## 2022-08-24 NOTE — Telephone Encounter (Signed)
Patient called back- states she had her Hst through [employer] back in April.

## 2022-08-24 NOTE — Telephone Encounter (Signed)
Lm x2 for patient.  Will close encounter per office protocol.   

## 2022-08-24 NOTE — Patient Instructions (Addendum)
Continue on Trelegy 1 puff daily , rinse after use.  Albuterol inhaler or neb As needed   Zytec 10mg  At bedtime  As needed drainage.   Begin CPAP At bedtime, wear all night for at least 6hr or more.  Use caution with sedating medications.  Healthy sleep regimen  Do not drive if sleepy .   2 D Echo as planned   Follow up in 2 months with Dr. Jayme Cloud and As needed

## 2022-08-24 NOTE — Progress Notes (Signed)
@Patient  ID: Gina Frazier, female    DOB: [birth year], 77 y.o.   MRN: [id number]  Chief Complaint  Patient presents with   Follow-up    Referring provider: Dana Allan, MD  HPI: 77 year old female never smoker followed for asthma , lung nodules(stable on serial CT) , and sleep apnea (dx 07/2022)  Medical history significant for atrial fibrillation, difficult to control hypertension and dementia  TEST/EVENTS :  CT chest November 18, 2021 shows new nodules in the left upper lobe possible infectious versus inflammatory.  Small nodules in the right upper lobe.   PFTs May 23, 2021 showed normal lung function with FEV1 at 94%, ratio 81, FVC 87%, no significant bronchodilator response, DLCO 99%   CT chest June 30, 2022 left upper lobe pulmonary nodules resolved, multiple scattered pulmonary nodules stable dating back to November 2022, largest measuring 6 mm likely benign etiology  Home sleep study Aug 04, 2022 AHI 10/hour SpO2 low 73%-time <88% was 3% baseline O2 sat 98%.   08/24/2022 Follow up : OSA , Asthma  Patient returns for 6-week follow-up.  Patient was seen last visit after recent asthmatic bronchitic exacerbation.  She says that symptoms have improved.  She is back to her baseline.  Remains on Trelegy daily. Takes zyrtec most days .   Last visit she had complaints of snoring, restless sleep and daytime sleepiness.  She was set up for home sleep study that was completed on Aug 04, 2022 that showed mild sleep apnea AHI at 10/hour and SpO2 low at 73%.  O2 baseline was 98%.  We discussed treatment options.  Will proceed with CPAP therapy. Is on Trazodone for insomnia, says it is helping .   Daughter is with her for today's visit.  She helps with her care at home.  Allergies  Allergen Reactions   Fish Allergy Anaphylaxis, Nausea And Vomiting and Rash   Pravastatin     Diarrhea/constipation per pt      Immunization History  Administered Date(s) Administered   Fluad  Quad(high Dose 65+) 11/10/2018, 12/11/2019, 12/05/2020, 11/21/2021   Hepatitis B 03/16/2014, 10/11/2014, 11/25/2014, 07/29/2015   Hepatitis B, ADULT 03/16/2014, 10/11/2014, 11/25/2014, 07/29/2015   Influenza Split 01/05/2011, 12/23/2011, 01/06/2013   Influenza Whole 04/19/2010   Influenza, High Dose Seasonal PF 12/17/2015, 02/12/2017, 12/03/2017   Moderna Covid-19 Vaccine Bivalent Booster 13yrs & up 01/23/2021   Moderna SARS-COV2 Booster Vaccination 02/15/2020   Moderna Sars-Covid-2 Vaccination 04/28/2019, 05/27/2019   Pneumococcal Conjugate-13 09/29/2013, 01/28/2017   Pneumococcal Polysaccharide-23 07/21/2010   Td 04/19/2010   Tdap 01/06/2013   Zoster, Live 07/31/2013    Past Medical History:  Diagnosis Date   Allergy    Anxiety    Asthma    Bell's palsy    left   Chronic back pain    Family history of adverse reaction to anesthesia    pts daughter had severe vomiting 2014    Hip joint pain    left   HLD (hyperlipidemia)    Hypertension    Neuropathy    Osteoarthritis    PAF (paroxysmal atrial fibrillation) (HCC)    a. initially diagnosed 04/2014; b. on Eliquis; c. CHADS2VASc = 3   Palpitations    Wears contact lenses    Wears dentures    partial upper    Tobacco History: Social History   Tobacco Use  Smoking Status Never  Smokeless Tobacco Never   Counseling given: Not Answered   Outpatient Medications Prior to Visit  Medication Sig Dispense Refill  acetaminophen (TYLENOL) 500 MG tablet Take 1,000 mg every 8 (eight) hours as needed by mouth for mild pain.     albuterol (PROVENTIL) (2.5 MG/3ML) 0.083% nebulizer solution Take 3 mLs (2.5 mg total) by nebulization every 6 (six) hours as needed for wheezing or shortness of breath. 150 mL 11   albuterol (VENTOLIN HFA) 108 (90 Base) MCG/ACT inhaler Inhale 1-2 puffs into the lungs every 6 (six) hours as needed for wheezing or shortness of breath. 18 g 12   alendronate (FOSAMAX) 70 MG tablet Take 1 tablet (70 mg total)  by mouth every 7 (seven) days. Take with a full glass of water on an empty stomach. 4 tablet 11   amLODipine (NORVASC) 10 MG tablet Take 1/2 pill daily (5 mg). After one week if blood pressure still >140, increase up to 10 mg daily (whole pill) 90 tablet 3   apixaban (ELIQUIS) 5 MG TABS tablet TAKE 1 TABLET BY MOUTH TWICE DAILY 180 tablet 1   Calcium Carbonate-Vitamin D 600-400 MG-UNIT tablet Take by mouth.     Cholecalciferol (VITAMIN D) 2000 units tablet Take 2,000 Units by mouth daily.     doxazosin (CARDURA) 2 MG tablet Take 1 tablet (2 mg total) by mouth 2 (two) times daily. 180 tablet 3   flecainide (TAMBOCOR) 50 MG tablet TAKE 1 TABLET(50 MG) BY MOUTH TWICE DAILY 180 tablet 0   Fluticasone-Umeclidin-Vilant (TRELEGY ELLIPTA) 200-62.5-25 MCG/ACT AEPB Inhale 1 puff into the lungs daily. 1 each 5   hydrALAZINE (APRESOLINE) 25 MG tablet Take 1 tablet (25 mg total) by mouth 2 (two) times daily as needed (For pressure >160, take  hydralazine 25 mg x 1 If pressure does not come down after 2 hours, take an additional hydralazine). 60 tablet 3   ipratropium (ATROVENT) 0.06 % nasal spray Place 2 sprays into both nostrils 3 (three) times daily. 15 mL 12   memantine (NAMENDA) 5 MG tablet Take 5 mg by mouth 2 (two) times daily.     methocarbamol (ROBAXIN) 500 MG tablet Take 500 mg by mouth 2 (two) times daily.     Multiple Vitamins-Minerals (MULTIVITAMIN WITH MINERALS) tablet Take 1 tablet by mouth daily.     nitroGLYCERIN (NITROSTAT) 0.4 MG SL tablet Place 1 tablet (0.4 mg total) under the tongue every 5 (five) minutes as needed for chest pain. 25 tablet 3   propranolol ER (INDERAL LA) 60 MG 24 hr capsule TAKE 1 CAPSULE(60 MG) BY MOUTH DAILY 90 capsule 3   sodium chloride (OCEAN) 0.65 % SOLN nasal spray Place 1-2 sprays into both nostrils daily as needed for congestion. 30 mL 12   telmisartan (MICARDIS) 40 MG tablet TAKE 60 MG(1 AND 1/2 TABLET) NIGHTLY 45 tablet 6   traZODone (DESYREL) 50 MG tablet Take  0.5-1 tablets (25-50 mg total) by mouth at bedtime as needed for sleep. 30 tablet 3   No facility-administered medications prior to visit.     Review of Systems:   Constitutional:   No  weight loss, night sweats,  Fevers, chills, + fatigue, or  lassitude.  HEENT:   No headaches,  Difficulty swallowing,  Tooth/dental problems, or  Sore throat,                No sneezing, itching, ear ache, nasal congestion, post nasal drip,   CV:  No chest pain,  Orthopnea, PND, swelling in lower extremities, anasarca, dizziness, palpitations, syncope.   GI  No heartburn, indigestion, abdominal pain, nausea, vomiting, diarrhea, change in bowel habits,  loss of appetite, bloody stools.   Resp: No shortness of breath with exertion or at rest.  No excess mucus, no productive cough,  No non-productive cough,  No coughing up of blood.  No change in color of mucus.  No wheezing.  No chest wall deformity  Skin: no rash or lesions.  GU: no dysuria, change in color of urine, no urgency or frequency.  No flank pain, no hematuria   MS:  No joint pain or swelling.  No decreased range of motion.  No back pain.    Physical Exam  BP (!) 108/58 (BP Location: Left Arm, Patient Position: Sitting, Cuff Size: Normal)   Pulse 63   Ht 5\' 2"  (1.575 m)   Wt 145 lb 3.2 oz (65.9 kg)   SpO2 99%   BMI 26.56 kg/m   GEN: A/Ox3; pleasant , NAD, well nourished    HEENT:  /AT,  EACs-clear, TMs-wnl, NOSE-clear, THROAT-clear, no lesions, no postnasal drip or exudate noted.   NECK:  Supple w/ fair ROM; no JVD; normal carotid impulses w/o bruits; no thyromegaly or nodules palpated; no lymphadenopathy.    RESP  Clear  P & A; w/o, wheezes/ rales/ or rhonchi. no accessory muscle use, no dullness to percussion  CARD:  RRR, no m/r/g, no peripheral edema, pulses intact, no cyanosis or clubbing.  GI:   Soft & nt; nml bowel sounds; no organomegaly or masses detected.   Musco: Warm bil, no deformities or joint swelling noted.    Neuro: alert, no focal deficits noted.    Skin: Warm, no lesions or rashes    Lab Results:  CBC    Component Value Date/Time   WBC 5.2 07/08/2022 1402   RBC 3.87 07/08/2022 1402   HGB 12.2 07/08/2022 1402   HCT 36.1 07/08/2022 1402   PLT 234.0 07/08/2022 1402   MCV 93.4 07/08/2022 1402   MCH 31.2 04/22/2021 1043   MCHC 33.8 07/08/2022 1402   RDW 14.2 07/08/2022 1402   LYMPHSABS 1.4 07/08/2022 1402   MONOABS 0.4 07/08/2022 1402   EOSABS 0.1 07/08/2022 1402   BASOSABS 0.1 07/08/2022 1402    BMET    Component Value Date/Time   NA 139 07/08/2022 1402   K 4.0 07/08/2022 1402   CL 100 07/08/2022 1402   CO2 31 07/08/2022 1402   GLUCOSE 86 07/08/2022 1402   BUN 21 07/08/2022 1402   CREATININE 1.03 07/08/2022 1402   CREATININE 0.97 01/10/2021 1430   CALCIUM 9.3 07/08/2022 1402   GFRNONAA >60 07/23/2019 0114   GFRAA >60 07/23/2019 0114    BNP No results found for: "BNP"  ProBNP No results found for: "PROBNP"  Imaging: No results found.        No data to display          Lab Results  Component Value Date   NITRICOXIDE 13 06/18/2022        Assessment & Plan:   OSA (obstructive sleep apnea) Mild obstructive sleep apnea.  Patient has multiple comorbidities including A-fib, hypertension and dementia.  She has pretty significant symptom burden.  Will proceed with CPAP therapy.  Begin CPAP AutoSet 5 to 15 cm H2O.  Mask of choice.  Patient education was given to patient and her daughter. - discussed how weight can impact sleep and risk for sleep disordered breathing - discussed options to assist with weight loss: combination of diet modification, cardiovascular and strength training exercises   - had an extensive discussion regarding the adverse health consequences  related to untreated sleep disordered breathing - specifically discussed the risks for hypertension, coronary artery disease, cardiac dysrhythmias, cerebrovascular disease, and diabetes -  lifestyle modification discussed   - discussed how sleep disruption can increase risk of accidents, particularly when driving - safe driving practices were discussed   Plan  Patient Instructions  Continue on Trelegy 1 puff daily , rinse after use.  Albuterol inhaler or neb As needed   Zytec 10mg  At bedtime  As needed drainage.   Begin CPAP At bedtime, wear all night for at least 6hr or more.  Use caution with sedating medications.  Healthy sleep regimen  Do not drive if sleepy .   2 D Echo as planned   Follow up in 2 months with Dr. Jayme Cloud and As needed      Asthma Mild persistent asthma well-controlled on Trelegy. Continue on current regimen.  Continue control for triggers.  Continue on Zyrtec as needed Plan  Patient Instructions  Continue on Trelegy 1 puff daily , rinse after use.  Albuterol inhaler or neb As needed   Zytec 10mg  At bedtime  As needed drainage.   Begin CPAP At bedtime, wear all night for at least 6hr or more.  Use caution with sedating medications.  Healthy sleep regimen  Do not drive if sleepy .   2 D Echo as planned   Follow up in 2 months with Dr. Jayme Cloud and As needed        Rubye Oaks, NP 08/24/2022

## 2022-08-24 NOTE — Assessment & Plan Note (Signed)
Mild persistent asthma well-controlled on Trelegy. Continue on current regimen.  Continue control for triggers.  Continue on Zyrtec as needed Plan  Patient Instructions  Continue on Trelegy 1 puff daily , rinse after use.  Albuterol inhaler or neb As needed   Zytec 10mg  At bedtime  As needed drainage.   Begin CPAP At bedtime, wear all night for at least 6hr or more.  Use caution with sedating medications.  Healthy sleep regimen  Do not drive if sleepy .   2 D Echo as planned   Follow up in 2 months with Dr. Jayme Cloud and As needed

## 2022-08-24 NOTE — Telephone Encounter (Signed)
Noted  

## 2022-08-24 NOTE — Assessment & Plan Note (Signed)
Mild obstructive sleep apnea.  Patient has multiple comorbidities including A-fib, hypertension and dementia.  She has pretty significant symptom burden.  Will proceed with CPAP therapy.  Begin CPAP AutoSet 5 to 15 cm H2O.  Mask of choice.  Patient education was given to patient and her daughter. - discussed how weight can impact sleep and risk for sleep disordered breathing - discussed options to assist with weight loss: combination of diet modification, cardiovascular and strength training exercises   - had an extensive discussion regarding the adverse health consequences related to untreated sleep disordered breathing - specifically discussed the risks for hypertension, coronary artery disease, cardiac dysrhythmias, cerebrovascular disease, and diabetes - lifestyle modification discussed   - discussed how sleep disruption can increase risk of accidents, particularly when driving - safe driving practices were discussed   Plan  Patient Instructions  Continue on Trelegy 1 puff daily , rinse after use.  Albuterol inhaler or neb As needed   Zytec 10mg  At bedtime  As needed drainage.   Begin CPAP At bedtime, wear all night for at least 6hr or more.  Use caution with sedating medications.  Healthy sleep regimen  Do not drive if sleepy .   2 D Echo as planned   Follow up in 2 months with Dr. Jayme Cloud and As needed

## 2022-08-26 NOTE — Progress Notes (Signed)
Agree with the details of the visit as noted by Tammy Parrett, NP.  C. Laura Kimon Loewen, MD [facility] 

## 2022-08-26 NOTE — Progress Notes (Signed)
Agree with the details of the visit as noted by Tammy Parrett, NP.  C. Laura Arnecia Ector, MD [facility] 

## 2022-09-11 ENCOUNTER — Other Ambulatory Visit: Payer: Self-pay | Admitting: Cardiovascular Disease

## 2022-09-11 ENCOUNTER — Other Ambulatory Visit: Payer: Self-pay | Admitting: Family

## 2022-09-14 ENCOUNTER — Ambulatory Visit: Payer: Medicare Other | Admitting: Pulmonary Disease

## 2022-09-18 ENCOUNTER — Ambulatory Visit: Payer: Medicare Other | Attending: Pulmonary Disease

## 2022-09-18 ENCOUNTER — Telehealth: Payer: Self-pay | Admitting: Adult Health

## 2022-09-18 DIAGNOSIS — R0602 Shortness of breath: Secondary | ICD-10-CM

## 2022-09-18 LAB — ECHOCARDIOGRAM COMPLETE
AR max vel: 2.65 cm2
AV Area VTI: 2.71 cm2
AV Area mean vel: 2.71 cm2
AV Mean grad: 4 mmHg
AV Peak grad: 7.1 mmHg
Ao pk vel: 1.33 m/s
Area-P 1/2: 3.77 cm2
Calc EF: 57.9 %
S' Lateral: 2.55 cm
Single Plane A2C EF: 59.2 %
Single Plane A4C EF: 54.7 %

## 2022-09-18 NOTE — Telephone Encounter (Signed)
Pt's daughter states she hasn't heard from DME company in regards to a CPAP

## 2022-09-21 NOTE — Telephone Encounter (Signed)
I have sent urgent message to [employer] to check on this issue

## 2022-09-22 ENCOUNTER — Other Ambulatory Visit: Payer: Self-pay | Admitting: Cardiovascular Disease

## 2022-09-22 NOTE — Telephone Encounter (Signed)
Pt daughter calling in bc [facility] needs the number of time pt has apnea per hr and echocardio results

## 2022-09-22 NOTE — Telephone Encounter (Signed)
I have received a message back from [place] with [employer] "Due to the low AHI and some other complications this order will need to be re-viewed by our intake mangers and Auth team. It will be a bit before I am able to give you a better answer on this order as it will need to be review by a team of specialists.   I will keep you posted with any info I get on this one. I have let sales Know ( Melissa) as well so she is in the loop.

## 2022-09-24 NOTE — Telephone Encounter (Signed)
This was the last response that I have from 09/22/22  09/22/22  1:23 PM Note I have received a message back from [place] with [employer] "Due to the low AHI and some other complications this order will need to be re-viewed by our intake mangers and Auth team. It will be a bit before I am able to give you a better answer on this order as it will need to be review by a team of specialists.   I will keep you posted with any info I get on this one. I have let sales Know ( Melissa) as well so she is in the loop

## 2022-09-24 NOTE — Telephone Encounter (Signed)
Synetta Fail have you heard anything from [employer] regarding this patient?

## 2022-09-25 ENCOUNTER — Encounter: Payer: Self-pay | Admitting: Cardiovascular Disease

## 2022-09-25 ENCOUNTER — Ambulatory Visit: Payer: Medicare Other | Attending: Cardiovascular Disease | Admitting: Cardiovascular Disease

## 2022-09-25 VITALS — BP 136/64 | HR 60 | Ht 62.0 in | Wt 145.4 lb

## 2022-09-25 DIAGNOSIS — R079 Chest pain, unspecified: Secondary | ICD-10-CM

## 2022-09-25 DIAGNOSIS — I480 Paroxysmal atrial fibrillation: Secondary | ICD-10-CM | POA: Diagnosis not present

## 2022-09-25 DIAGNOSIS — G250 Essential tremor: Secondary | ICD-10-CM

## 2022-09-25 DIAGNOSIS — I4891 Unspecified atrial fibrillation: Secondary | ICD-10-CM

## 2022-09-25 DIAGNOSIS — I700 Atherosclerosis of aorta: Secondary | ICD-10-CM

## 2022-09-25 DIAGNOSIS — E785 Hyperlipidemia, unspecified: Secondary | ICD-10-CM | POA: Diagnosis not present

## 2022-09-25 DIAGNOSIS — I10 Essential (primary) hypertension: Secondary | ICD-10-CM

## 2022-09-25 MED ORDER — TELMISARTAN 40 MG PO TABS: ORAL_TABLET | ORAL | 6 refills | Status: DC

## 2022-09-25 MED ORDER — HYDRALAZINE HCL 25 MG PO TABS: 25.0000 mg | ORAL_TABLET | Freq: Two times a day (BID) | ORAL | 3 refills | Status: AC | PRN

## 2022-09-25 MED ORDER — NITROGLYCERIN 0.4 MG SL SUBL: 0.4000 mg | SUBLINGUAL_TABLET | SUBLINGUAL | 3 refills | Status: AC | PRN

## 2022-09-25 MED ORDER — DOXAZOSIN MESYLATE 2 MG PO TABS: 2.0000 mg | ORAL_TABLET | Freq: Two times a day (BID) | ORAL | 3 refills | Status: AC

## 2022-09-25 MED ORDER — AMLODIPINE BESYLATE 10 MG PO TABS: ORAL_TABLET | ORAL | 3 refills | Status: AC

## 2022-09-25 MED ORDER — FLECAINIDE ACETATE 50 MG PO TABS: ORAL_TABLET | ORAL | 3 refills | Status: AC

## 2022-09-25 MED ORDER — APIXABAN 5 MG PO TABS
5.0000 mg | ORAL_TABLET | Freq: Two times a day (BID) | ORAL | 3 refills | Status: DC
Start: 2022-09-25 — End: 2022-12-28

## 2022-09-25 MED ORDER — PROPRANOLOL HCL ER 60 MG PO CP24
ORAL_CAPSULE | ORAL | 3 refills | Status: DC
Start: 2022-09-25 — End: 2023-01-19

## 2022-09-25 NOTE — Progress Notes (Signed)
Date:  09/25/2022   ID:  Gina Frazier, Gina Frazier 12-09-1945, MRN [id number]  Patient Location:  [address] [place] [address]   Provider location:   [facility], [place] office  PCP:  Kara Dies, NP  Cardiologist:  None   Chief Complaint  Patient presents with   3 month follow up     Patient is off balance but no more than usual; she has an appointment with the Neurologist to discuss. Medications reviewed by the patient verbally.     History of Present Illness:    Gina Frazier is a 77 y.o. female with PMH of  Hypertension Evaluated in 2012 for acute hypertension in the dentist chair,  hospital with atrial fibrillation on 04/28/2014, started on Cardizem, Lopressor converting to normal sinus rhythm (Prior episode 6 months prior) started on anticoagulation with eliquis 5 mg by mouth twice a day,   flecainide 50 mg twice a day, continued on beta blocker. OSA , denied a machine She presents today for follow-up of her atrial fibrillation, hypertension, left chest pain  Last seen by myself in clinic June 23, 2022  Echocardiogram September 18, 2022 EF 55 to 60% moderately dilated left atrium mildly elevated right heart pressures  Moving in August to the new apartment   on Cardura 2 twice daily, amlodipine 5 up to 10 mg daily, propranolol ER 60 daily Hydralazine 25 as needed for pressure over 160  In general blood pressure has been well-controlled Stress at home improved  Breathing stable Denies significant leg swelling, no chest pain or shortness of breath on exertion  Concerned about gait instability, no regular exercise program Stumbling when walking at times  EKG personally reviewed by myself on todays visit EKG Interpretation Date/Time:  Friday September 25 2022 09:23:58 EDT Ventricular Rate:  60 PR Interval:  160 QRS Duration:  118 QT Interval:  452 QTC Calculation: 452 R Axis:   -20  Text Interpretation: Normal sinus rhythm Left  ventricular hypertrophy with QRS widening ( R in aVL , Cornell product ) When compared with ECG of 22-Jul-2019 23:46, No significant change was found Confirmed by Julien Nordmann [phone]) on 09/25/2022 9:45:04 AM    CT ABD Aortic atherosclerosis. mild to moderate diffuse aortic atherosclerosis descending aorta  Echocardiogram from the hospital shows ejection fraction 60-65%, otherwise normal study    Past Medical History:  Diagnosis Date   Allergy    Anxiety    Asthma    Bell's palsy    left   Chronic back pain    Family history of adverse reaction to anesthesia    pts daughter had severe vomiting 2014    Hip joint pain    left   HLD (hyperlipidemia)    Hypertension    Neuropathy    Osteoarthritis    PAF (paroxysmal atrial fibrillation) (HCC)    a. initially diagnosed 04/2014; b. on Eliquis; c. CHADS2VASc = 3   Palpitations    Wears contact lenses    Wears dentures    partial upper   Past Surgical History:  Procedure Laterality Date   CHOLECYSTECTOMY     COLONOSCOPY WITH PROPOFOL N/A 11/05/2014   Procedure: COLONOSCOPY WITH PROPOFOL;  Surgeon: Midge Minium, MD;  Location: [facility];  Service: Endoscopy;  Laterality: N/A;   COLONOSCOPY WITH PROPOFOL N/A 06/05/2021   Procedure: COLONOSCOPY WITH PROPOFOL;  Surgeon: Jaynie Collins, DO;  Location: [facility];  Service: Gastroenterology;  Laterality: N/AEverlene Balls  ESOPHAGOGASTRODUODENOSCOPY (EGD) WITH PROPOFOL N/A 06/05/2021   Procedure: ESOPHAGOGASTRODUODENOSCOPY (EGD) WITH PROPOFOL;  Surgeon: Jaynie Collins, DO;  Location: [facility] ENDOSCOPY;  Service: Gastroenterology;  Laterality: N/A;   JOINT REPLACEMENT     left knee x 2 04/2024 and 01/2017 Dr. Rosita Kea    KNEE ARTHROSCOPY Left 06/28/2014   Procedure: ARTHROSCOPY KNEE WITH PARTIALLATERAL MENISECTOMY AND DEBRIDEMENT;  Surgeon: Jene Every, MD;  Location: [facility];  Service: Orthopedics;  Laterality: Left;   TOOTH EXTRACTION     TOTAL KNEE ARTHROPLASTY Left  05/02/2015   Procedure: TOTAL KNEE ARTHROPLASTY;  Surgeon: Kennedy Bucker, MD;  Location: [facility];  Service: Orthopedics;  Laterality: Left;   TOTAL KNEE REVISION Left 02/11/2017   Procedure: TOTAL KNEE REVISION, POLYETHELENE EXCHANGE;  Surgeon: Kennedy Bucker, MD;  Location: [facility];  Service: Orthopedics;  Laterality: Left;     Current Meds  Medication Sig   acetaminophen (TYLENOL) 500 MG tablet Take 1,000 mg every 8 (eight) hours as needed by mouth for mild pain.   albuterol (PROVENTIL) (2.5 MG/3ML) 0.083% nebulizer solution Take 3 mLs (2.5 mg total) by nebulization every 6 (six) hours as needed for wheezing or shortness of breath.   albuterol (VENTOLIN HFA) 108 (90 Base) MCG/ACT inhaler Inhale 1-2 puffs into the lungs every 6 (six) hours as needed for wheezing or shortness of breath.   alendronate (FOSAMAX) 70 MG tablet Take 1 tablet (70 mg total) by mouth every 7 (seven) days. Take with a full glass of water on an empty stomach.   amLODipine (NORVASC) 10 MG tablet TAKE 1/2 TABLET DAILY. AFTER ONE WEEK IF BLOOD PRESSURE STILL>140, INCREASE UP TO 1 TABLET DAILY   apixaban (ELIQUIS) 5 MG TABS tablet TAKE 1 TABLET BY MOUTH TWICE DAILY   Calcium Carbonate-Vitamin D 600-400 MG-UNIT tablet Take by mouth.   Cholecalciferol (VITAMIN D) 2000 units tablet Take 2,000 Units by mouth daily.   doxazosin (CARDURA) 2 MG tablet Take 1 tablet (2 mg total) by mouth 2 (two) times daily.   flecainide (TAMBOCOR) 50 MG tablet TAKE 1 TABLET(50 MG) BY MOUTH TWICE DAILY   Fluticasone-Umeclidin-Vilant (TRELEGY ELLIPTA) 200-62.5-25 MCG/ACT AEPB Inhale 1 puff into the lungs daily.   hydrALAZINE (APRESOLINE) 25 MG tablet Take 1 tablet (25 mg total) by mouth 2 (two) times daily as needed (For pressure >160, take  hydralazine 25 mg x 1 If pressure does not come down after 2 hours, take an additional hydralazine).   ipratropium (ATROVENT) 0.06 % nasal spray Place 2 sprays into both nostrils 3 (three) times daily.   memantine  (NAMENDA) 5 MG tablet Take 5 mg by mouth 2 (two) times daily.   methocarbamol (ROBAXIN) 500 MG tablet Take 500 mg by mouth 2 (two) times daily.   Multiple Vitamins-Minerals (MULTIVITAMIN WITH MINERALS) tablet Take 1 tablet by mouth daily.   nitroGLYCERIN (NITROSTAT) 0.4 MG SL tablet Place 1 tablet (0.4 mg total) under the tongue every 5 (five) minutes as needed for chest pain.   propranolol ER (INDERAL LA) 60 MG 24 hr capsule TAKE 1 CAPSULE(60 MG) BY MOUTH DAILY   sodium chloride (OCEAN) 0.65 % SOLN nasal spray Place 1-2 sprays into both nostrils daily as needed for congestion.   telmisartan (MICARDIS) 40 MG tablet TAKE 60 MG(1 AND 1/2 TABLET) NIGHTLY   traZODone (DESYREL) 50 MG tablet Take 0.5-1 tablets (25-50 mg total) by mouth at bedtime as needed for sleep.     Allergies:   Fish allergy and Pravastatin   Social History   Tobacco  Use   Smoking status: Never   Smokeless tobacco: Never  Vaping Use   Vaping status: Never Used  Substance Use Topics   Alcohol use: No   Drug use: No     Family Hx: The patient's family history includes Arthritis in her daughter; Bladder Cancer in her sister; Breast cancer in an other family member; Breast cancer (age of onset: 24) in her sister; Cancer in an other family member; Depression in her daughter, sister, and another family member; Diabetes in her mother and sister; GER disease in her daughter; Heart disease in her brother, father, mother, and another family member; Hypertension in her brother, mother, and sister; Hypothyroidism in her daughter; Lupus in her daughter; Mental illness in her mother.  ROS:   Please see the history of present illness.    Review of Systems  Constitutional: Negative.   HENT: Negative.    Respiratory: Negative.    Cardiovascular: Negative.   Gastrointestinal: Negative.   Musculoskeletal: Negative.   Neurological: Negative.   Psychiatric/Behavioral: Negative.    All other systems reviewed and are negative.     Labs/Other Tests and Data Reviewed:    Recent Labs: 07/08/2022: ALT 9; BUN 21; Creatinine, Ser 1.03; Hemoglobin 12.2; Platelets 234.0; Potassium 4.0; Sodium 139; TSH 2.78   Recent Lipid Panel Lab Results  Component Value Date/Time   CHOL 229 (H) 07/08/2022 02:02 PM   TRIG 75.0 07/08/2022 02:02 PM   HDL 75.90 07/08/2022 02:02 PM   CHOLHDL 3 07/08/2022 02:02 PM   LDLCALC 138 (H) 07/08/2022 02:02 PM   LDLCALC 106 (H) 01/10/2021 02:30 PM    Wt Readings from Last 3 Encounters:  09/25/22 145 lb 6 oz (65.9 kg)  08/24/22 145 lb 3.2 oz (65.9 kg)  08/13/22 145 lb (65.8 kg)     Exam:    Vital Signs:  BP 136/64 (BP Location: Left Arm, Patient Position: Sitting, Cuff Size: Normal)   Pulse 60   Ht 5\' 2"  (1.575 m)   Wt 145 lb 6 oz (65.9 kg)   BMI 26.59 kg/m   Constitutional:  oriented to person, place, and time. No distress.  HENT:  Head: Grossly normal Eyes:  no discharge. No scleral icterus.  Neck: No JVD, no carotid bruits  Cardiovascular: Regular rate and rhythm, no murmurs appreciated Pulmonary/Chest: Clear to auscultation bilaterally, no wheezes or rails Abdominal: Soft.  no distension.  no tenderness.  Musculoskeletal: Normal range of motion Neurological:  normal muscle tone. Coordination normal. No atrophy Skin: Skin warm and dry Psychiatric: normal affect, pleasant   ASSESSMENT & PLAN:    PAF (paroxysmal atrial fibrillation) (HCC) Maintaining normal sinus rhythm, Recommend she continue current dose of propranolol, flecainide, Eliquis  Essential hypertension Blood pressure is well controlled on today's visit. No changes made to the medications.  Chest pain, unspecified type No significant chest pain on today's visit No further workup needed  Gait instability recommend regular walking program She is looking for a walker to buy  Aortic atherosclerosis Previously on statin with Zetia Previously declined restarting cholesterol medication  Adjustment  disorder Long discussion with patient and daughter,  Moving into a new apartment next month, stress should improve   Total encounter time more than 30 minutes  Greater than 50% was spent in counseling and coordination of care with the patient    Signed, Julien Nordmann, MD  09/25/2022 9:46 AM    [facility] [address], [place], [place] [address]

## 2022-09-25 NOTE — Patient Instructions (Signed)
Medication Instructions:  No changes  If you need a refill on your cardiac medications before your next appointment, please call your pharmacy.   Lab work: No new labs needed  Testing/Procedures: No new testing needed  Follow-Up: At [facility], you and your health needs are our priority.  As part of our continuing mission to provide you with exceptional heart care, we have created designated Provider Care Teams.  These Care Teams include your primary Cardiologist (physician) and Advanced Practice Providers (APPs -  Physician Assistants and Nurse Practitioners) who all work together to provide you with the care you need, when you need it.  You will need a follow up appointment in 12 months  Providers on your designated Care Team:   Christopher Berge, NP Ryan Dunn, PA-C Cadence Furth, PA-C  COVID-19 Vaccine Information can be found at: [email or web address] For questions related to vaccine distribution or appointments, please email [email or web address] or call [phone].   

## 2022-09-26 ENCOUNTER — Other Ambulatory Visit: Payer: Self-pay | Admitting: Cardiovascular Disease

## 2022-10-01 ENCOUNTER — Other Ambulatory Visit: Payer: Self-pay | Admitting: Physician Assistant

## 2022-10-01 ENCOUNTER — Ambulatory Visit (INDEPENDENT_AMBULATORY_CARE_PROVIDER_SITE_OTHER): Payer: Medicare Other | Admitting: Physician Assistant

## 2022-10-01 ENCOUNTER — Encounter: Payer: Self-pay | Admitting: Physician Assistant

## 2022-10-01 ENCOUNTER — Ambulatory Visit (INDEPENDENT_AMBULATORY_CARE_PROVIDER_SITE_OTHER): Payer: Medicare Other

## 2022-10-01 VITALS — BP 122/76 | Ht 62.0 in | Wt 144.0 lb

## 2022-10-01 VITALS — BP 152/66 | HR 60 | Temp 97.9°F | Ht 62.0 in | Wt 144.0 lb

## 2022-10-01 DIAGNOSIS — Z0000 Encounter for general adult medical examination without abnormal findings: Secondary | ICD-10-CM

## 2022-10-01 DIAGNOSIS — I700 Atherosclerosis of aorta: Secondary | ICD-10-CM

## 2022-10-01 DIAGNOSIS — G4733 Obstructive sleep apnea (adult) (pediatric): Secondary | ICD-10-CM

## 2022-10-01 DIAGNOSIS — M810 Age-related osteoporosis without current pathological fracture: Secondary | ICD-10-CM

## 2022-10-01 DIAGNOSIS — E7800 Pure hypercholesterolemia, unspecified: Secondary | ICD-10-CM

## 2022-10-01 DIAGNOSIS — F02A4 Dementia in other diseases classified elsewhere, mild, with anxiety: Secondary | ICD-10-CM

## 2022-10-01 DIAGNOSIS — I1A0 Resistant hypertension: Secondary | ICD-10-CM

## 2022-10-01 DIAGNOSIS — G309 Alzheimer's disease, unspecified: Secondary | ICD-10-CM | POA: Diagnosis not present

## 2022-10-01 DIAGNOSIS — Z7689 Persons encountering health services in other specified circumstances: Secondary | ICD-10-CM | POA: Diagnosis not present

## 2022-10-01 DIAGNOSIS — I480 Paroxysmal atrial fibrillation: Secondary | ICD-10-CM

## 2022-10-01 MED ORDER — ROSUVASTATIN CALCIUM 5 MG PO TABS
5.0000 mg | ORAL_TABLET | ORAL | 1 refills | Status: AC
Start: 2022-10-02 — End: ?

## 2022-10-01 NOTE — Telephone Encounter (Signed)
Please review.  KP

## 2022-10-01 NOTE — Progress Notes (Signed)
Date:  10/01/2022   Name:  Gina Frazier   DOB:  03/28/1945   MRN:  [id number]   Chief Complaint: Establish Care and Memory Loss (Short time memory is bad, daughter has tried to get patient to go to the memory clinic and never got a referral from her previous provider, forgets what she is about to do, repeats herself because she forgot she already said something, pt take namenda doesn't feel like its working )  HPI Gina Frazier is a delightful 77 year old female with a history of HTN, HLD, paroxysmal A-fib on Eliqus, osteoporosis, OSA, dementia, tremor, neuropathy who presents new to the clinic today to establish care.  She is joined by her daughter today, whose name is also Gina Frazier, and lives with her.   Main reason for today's visit aside from establishing care is to obtain referral for "memory clinic" in [place].  Struggling with short-term memory, mood swings, depression.  Patient generally refuses puzzles and brain games at home, though she does read on the tablet a good bit.  Daughter feels like Gina Frazier is not working.  She is already scheduled to see [facility] neurology over to discuss dementia and gait, which I believe is the same facility as the memory clinic - reviewed with patient and daughter.  Saw cardiology Dr. Mariah Milling 09/25/2022 with no change to medications.  HLD with aortic atherosclerosis, refuses pravastatin due to terrible GI side effects. Says there was another statin she tolerated well previously prescribed by Dr Kennith Center... Plan for follow-up with him in 1 year.  Saw pulmonology 08/24/2022 for chronic lung issues and newly discovered OSA for which they felt she would benefit from CPAP due to notable symptom burden.  Apparently there was an issue with insurance coverage, so they are working to resolve this.  Upcoming appt for [facility] 11/04/22 for management of osteoporosis, currently on Fosamax.   Medication list has been reviewed and updated.  Current Meds  Medication Sig    acetaminophen (TYLENOL) 500 MG tablet Take 1,000 mg every 8 (eight) hours as needed by mouth for mild pain.   albuterol (PROVENTIL) (2.5 MG/3ML) 0.083% nebulizer solution Take 3 mLs (2.5 mg total) by nebulization every 6 (six) hours as needed for wheezing or shortness of breath.   albuterol (VENTOLIN HFA) 108 (90 Base) MCG/ACT inhaler Inhale 1-2 puffs into the lungs every 6 (six) hours as needed for wheezing or shortness of breath.   alendronate (FOSAMAX) 70 MG tablet Take 1 tablet (70 mg total) by mouth every 7 (seven) days. Take with a full glass of water on an empty stomach.   amLODipine (NORVASC) 10 MG tablet TAKE 1/2 TABLET DAILY. AFTER ONE WEEK IF BLOOD PRESSURE STILL>140, INCREASE UP TO 1 TABLET DAILY   apixaban (ELIQUIS) 5 MG TABS tablet Take 1 tablet (5 mg total) by mouth 2 (two) times daily.   Calcium Carbonate-Vitamin D 600-400 MG-UNIT tablet Take by mouth.   Cholecalciferol (VITAMIN D) 2000 units tablet Take 2,000 Units by mouth daily.   doxazosin (CARDURA) 2 MG tablet Take 1 tablet (2 mg total) by mouth 2 (two) times daily.   flecainide (TAMBOCOR) 50 MG tablet TAKE 1 TABLET(50 MG) BY MOUTH TWICE DAILY   Fluticasone-Umeclidin-Vilant (TRELEGY ELLIPTA) 200-62.5-25 MCG/ACT AEPB Inhale 1 puff into the lungs daily.   hydrALAZINE (APRESOLINE) 25 MG tablet Take 1 tablet (25 mg total) by mouth 2 (two) times daily as needed (For pressure >160, take  hydralazine 25 mg x 1 If pressure does not come down  after 2 hours, take an additional hydralazine).   ipratropium (ATROVENT) 0.06 % nasal spray Place 2 sprays into both nostrils 3 (three) times daily.   memantine (NAMENDA) 5 MG tablet Take 5 mg by mouth 2 (two) times daily.   methocarbamol (ROBAXIN) 500 MG tablet Take 500 mg by mouth 2 (two) times daily.   Multiple Vitamins-Minerals (MULTIVITAMIN WITH MINERALS) tablet Take 1 tablet by mouth daily.   nitroGLYCERIN (NITROSTAT) 0.4 MG SL tablet Place 1 tablet (0.4 mg total) under the tongue every 5  (five) minutes as needed for chest pain.   propranolol ER (INDERAL LA) 60 MG 24 hr capsule TAKE 1 CAPSULE(60 MG) BY MOUTH DAILY   sodium chloride (OCEAN) 0.65 % SOLN nasal spray Place 1-2 sprays into both nostrils daily as needed for congestion.   telmisartan (MICARDIS) 40 MG tablet TAKE 60 MG(1 AND 1/2 TABLET) NIGHTLY   traZODone (DESYREL) 50 MG tablet Take 0.5-1 tablets (25-50 mg total) by mouth at bedtime as needed for sleep.     Review of Systems  Constitutional:  Positive for fatigue. Negative for fever.  Respiratory:  Negative for chest tightness and shortness of breath.   Cardiovascular:  Negative for chest pain and palpitations.  Gastrointestinal:  Negative for abdominal pain.  Neurological:  Positive for tremors.       Memory loss  Psychiatric/Behavioral:  Positive for decreased concentration, dysphoric mood and sleep disturbance.     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/07/2018   Osteoporosis 07/29/2017   Orthostatic hypotension 07/05/2017   Bradycardia 07/05/2017   Essential tremor 03/31/2017   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   Chronic bilateral low back pain 12/12/2015   PAF (paroxysmal atrial fibrillation) (HCC)    Primary osteoarthritis of knee 05/02/2015   Hypertension 07/21/2010   Vitamin D deficiency 07/21/2010    Allergies  Allergen Reactions   Fish Allergy Anaphylaxis, Nausea And Vomiting and Rash   Pravastatin     Diarrhea/constipation  per pt      Immunization History  Administered Date(s) Administered   Fluad Quad(high Dose 65+) 11/10/2018, 12/11/2019, 12/05/2020, 11/21/2021   Hepatitis B 03/16/2014, 10/11/2014, 11/25/2014, 07/29/2015   Hepatitis B, ADULT 03/16/2014, 10/11/2014, 11/25/2014, 07/29/2015   Influenza Split 01/05/2011, 12/23/2011, 01/06/2013   Influenza Whole 04/19/2010   Influenza, High Dose Seasonal PF 12/17/2015, 02/12/2017, 12/03/2017   Moderna Covid-19 Vaccine Bivalent Booster 67yrs & up 01/23/2021   Moderna SARS-COV2 Booster Vaccination 02/15/2020   Moderna Sars-Covid-2 Vaccination 04/28/2019, 05/27/2019   Pneumococcal Conjugate-13 09/29/2013, 01/28/2017   Pneumococcal Polysaccharide-23 07/21/2010   Td 04/19/2010   Tdap 01/06/2013   Zoster, Live 07/31/2013    Past Surgical History:  Procedure Laterality Date   CHOLECYSTECTOMY     COLONOSCOPY WITH PROPOFOL N/A 11/05/2014   Procedure: COLONOSCOPY WITH PROPOFOL;  Surgeon: Midge Minium, MD;  Location: [facility];  Service: Endoscopy;  Laterality: N/A;   COLONOSCOPY WITH PROPOFOL N/A 06/05/2021   Procedure: COLONOSCOPY WITH PROPOFOL;  Surgeon: Jaynie Collins, DO;  Location: [facility];  Service: Gastroenterology;  Laterality: N/A;  ELIQUIS   ESOPHAGOGASTRODUODENOSCOPY (EGD) WITH PROPOFOL N/A 06/05/2021   Procedure: ESOPHAGOGASTRODUODENOSCOPY (EGD) WITH PROPOFOL;  Surgeon: Jaynie Collins, DO;  Location[facility];  Service: Gastroenterology;  Laterality: N/A;   JOINT REPLACEMENT     left knee x 2 04/2024 and 01/2017 Dr. 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   Procedure: TOTAL KNEE ARTHROPLASTY;  Surgeon: Kennedy Bucker, MD;  Location: [facility];  Service: Orthopedics;  Laterality: Left;   TOTAL  KNEE REVISION Left 02/11/2017   Procedure: TOTAL KNEE REVISION, POLYETHELENE EXCHANGE;  Surgeon: Kennedy Bucker, MD;  Location: [facility];  Service: Orthopedics;  Laterality: Left;    Social History   Tobacco Use   Smoking status: Never   Smokeless tobacco: Never  Vaping Use   Vaping status: Never Used  Substance Use Topics   Alcohol use: No   Drug use: No    Family History  Problem Relation Age of Onset   Heart disease Mother    Hypertension Mother    Mental illness Mother    Diabetes Mother    Arthritis Mother    Alzheimer's disease Mother    Heart disease Father    Hypertension Sister    Depression Sister    Diabetes Sister    Breast cancer Sister 39   Bladder Cancer Sister    Cancer Sister    Early death Sister    Heart disease Brother    Hypertension Brother    Cancer Brother    Arthritis Daughter    GER disease Daughter    Hypothyroidism Daughter    Lupus Daughter    Depression Daughter    Parkinson's disease Daughter    Asthma Daughter    ADD / ADHD Daughter    Depression Daughter    Learning disabilities Daughter    Anxiety disorder Daughter    Asthma Daughter    Cancer Daughter    Miscarriages / Stillbirths Daughter    Cancer Other        colon cancer, breast cancer    Heart disease Other    Breast cancer Other    Depression Other         10/01/2022    9:32 AM 08/13/2022    9:59 AM 07/08/2022    1:18 PM 06/29/2019   11:20 AM  GAD 7 : Generalized Anxiety Score  Nervous, Anxious, on Edge 2 1 1  0  Control/stop worrying 2 1 2  0  Worry too much - different things 2 1 2  0  Trouble relaxing 1 1 0 0  Restless 0 1 0 0  Easily annoyed or irritable 1 2 2  0  Afraid - awful might happen 0 1 0 0  Total GAD 7 Score 8 8 7  0  Anxiety Difficulty Somewhat difficult Very difficult Very difficult Not difficult at all       10/01/2022    9:31 AM 08/13/2022    9:59 AM 07/08/2022    1:18 PM  Depression screen PHQ 2/9  Decreased Interest 0 0 0  Down, Depressed,  Hopeless 2 2 2   PHQ - 2 Score 2 2 2   Altered sleeping 2 2 2   Tired, decreased energy 2 2 2   Change in appetite 0 0 0  Feeling bad or failure  about yourself  0 2 0  Trouble concentrating 3 1 0  Moving slowly or fidgety/restless 0 1 0  Suicidal thoughts 0 0 0  PHQ-9 Score 9 10 6   Difficult doing work/chores Somewhat difficult Extremely dIfficult Very difficult    BP Readings from Last 3 Encounters:  10/01/22 (!) 152/66  10/01/22 122/76  09/25/22 136/64    Wt Readings from Last 3 Encounters:  10/01/22 144 lb (65.3 kg)  10/01/22 144 lb (65.3 kg)  09/25/22 145 lb 6 oz (65.9 kg)    BP (!) 152/66 (BP Location: Left Arm, Patient Position: Sitting, Cuff Size: Normal)   Pulse 60   Temp 97.9 F (36.6 C) (Oral)   Ht 5\' 2"  (1.575 m)   Wt 144 lb (65.3 kg)   SpO2 95%   BMI 26.34 kg/m   Physical Exam Vitals and nursing note reviewed.  Constitutional:      Appearance: Normal appearance.  Neck:     Vascular: No carotid bruit.  Cardiovascular:     Rate and Rhythm: Normal rate and regular rhythm.     Heart sounds: No murmur heard.    No friction rub. No gallop.  Pulmonary:     Effort: Pulmonary effort is normal.     Breath sounds: Normal breath sounds.  Abdominal:     General: There is no distension.  Musculoskeletal:        General: Normal range of motion.  Skin:    General: Skin is warm and dry.  Neurological:     Mental Status: She is alert and oriented to person, place, and time.     Gait: Gait is intact.  Psychiatric:        Mood and Affect: Mood and affect normal.     Recent Labs     Component Value Date/Time   NA 139 07/08/2022 1402   K 4.0 07/08/2022 1402   CL 100 07/08/2022 1402   CO2 31 07/08/2022 1402   GLUCOSE 86 07/08/2022 1402   BUN 21 07/08/2022 1402   CREATININE 1.03 07/08/2022 1402   CREATININE 0.97 01/10/2021 1430   CALCIUM 9.3 07/08/2022 1402   PROT 6.7 07/08/2022 1402   ALBUMIN 4.0 07/08/2022 1402   AST 18 07/08/2022 1402   ALT 9 07/08/2022  1402   ALKPHOS 79 07/08/2022 1402   BILITOT 0.8 07/08/2022 1402   GFRNONAA >60 07/23/2019 0114   GFRAA >60 07/23/2019 0114    Lab Results  Component Value Date   WBC 5.2 07/08/2022   HGB 12.2 07/08/2022   HCT 36.1 07/08/2022   MCV 93.4 07/08/2022   PLT 234.0 07/08/2022   Lab Results  Component Value Date   HGBA1C 5.9 07/08/2022   Lab Results  Component Value Date   CHOL 229 (H) 07/08/2022   HDL 75.90 07/08/2022   LDLCALC 138 (H) 07/08/2022   TRIG 75.0 07/08/2022   CHOLHDL 3 07/08/2022   Lab Results  Component Value Date   TSH 2.78 07/08/2022     Assessment and Plan:  1. Encounter to establish care Prior chart notes reviewed, including recent labs.  Patient compliant with specialist visits.  Happy to welcome her to the practice today.  No labs needed at this time.  2. Mild Alzheimer's dementia with anxiety, unspecified timing of dementia onset (HCC) Proceed with neurology consult in October.  Patient/daughter to let me know if there is a separate memory clinic for which they need a referral, though I do believe this to be one and the  same.  3. Pure hypercholesterolemia Intolerant to pravastatin.  Patient reports there was another statin she tolerated well previously prescribed by Dr Kennith Center; chart review shows this to be rosuvastatin 5 mg taken 3 times per week.  MyChart message sent to patient to see if she would like to restart this.  4. Aortic atherosclerosis (HCC) Intolerant to pravastatin.  Patient reports there was another statin she tolerated well previously prescribed by Dr Kennith Center; chart review shows this to be rosuvastatin 5 mg taken 3 times per week.  MyChart message sent to patient to see if she would like to restart this.  5. OSA (obstructive sleep apnea) Sounds like pulmonology is working to get her approved for CPAP device, which would probably be helpful given her comorbidities and symptom burden  6. PAF (paroxysmal atrial fibrillation) (HCC) No A-fib  today.  Continue anticoagulation and specialist care  7. Age related osteoporosis, unspecified pathological fracture presence Continue Fosamax for now, encouraged physical activity, continue supplementing with calcium and vitamin D.  8. Resistant HTN Systolic blood pressure seems labile.  Normal initial vitals today, but hypertensive on provider recheck.  No medication changes at this time, monitor at home to ensure blood pressure returns to baseline.  Patient has hydralazine for as needed use if blood pressure exceeds 160 systolic.  Return in about 4 months (around 02/01/2023) for OV f/u chronic conditions.   AWV also completed today by Laurian Brim, CMA  Alvester Morin, PA-C, DMSc, Nutritionist [facility] [phone]

## 2022-10-01 NOTE — Patient Instructions (Signed)
  Gina Frazier , Thank you for taking time to come for your Medicare Wellness Visit. I appreciate your ongoing commitment to your health goals. Please review the following plan we discussed and let me know if I can assist you in the future.   These are the goals we discussed:  Goals      Follow up with Primary Care Provider     As needed. Stay active.     Increase physical activity        This is a list of the screening recommended for you and due dates:  Health Maintenance  Topic Date Due   Zoster (Shingles) Vaccine (1 of 2) 08/09/1964   COVID-19 Vaccine (4 - 2023-24 season) 10/17/2022*   Flu Shot  10/15/2022   DTaP/Tdap/Td vaccine (3 - Td or Tdap) 01/07/2023   Medicare Annual Wellness Visit  10/01/2023   Pneumonia Vaccine  Completed   DEXA scan (bone density measurement)  Completed   Hepatitis C Screening  Completed   HPV Vaccine  Aged Out   Colon Cancer Screening  Discontinued  *Topic was postponed. The date shown is not the original due date.

## 2022-10-01 NOTE — Progress Notes (Signed)
Date:  10/01/2022   Name:  Gina Frazier   DOB:  1945-09-23   MRN:  [id number]   Chief Complaint: AWV  HPI    Medication list has been reviewed and updated.  No outpatient medications have been marked as taking for the 10/01/22 encounter (Clinical Support) with Solenne Manwarren, Eulis Canner, CMA.     Review of Systems defer to provider  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/07/2018   Osteoporosis 07/29/2017   Orthostatic hypotension 07/05/2017   Bradycardia 07/05/2017   Essential tremor 03/31/2017   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   Chronic bilateral low back pain 12/12/2015   PAF (paroxysmal atrial fibrillation) (HCC)    Primary osteoarthritis of knee 05/02/2015   Hypertension 07/21/2010   Vitamin D deficiency 07/21/2010    Allergies  Allergen Reactions   Fish Allergy Anaphylaxis, Nausea And Vomiting and Rash   Pravastatin     Diarrhea/constipation per pt      Immunization History  Administered Date(s) Administered   Fluad Quad(high Dose 65+) 11/10/2018, 12/11/2019, 12/05/2020, 11/21/2021   Hepatitis B 03/16/2014, 10/11/2014, 11/25/2014, 07/29/2015   Hepatitis B, ADULT 03/16/2014, 10/11/2014, 11/25/2014, 07/29/2015   Influenza Split 01/05/2011, 12/23/2011, 01/06/2013   Influenza Whole 04/19/2010   Influenza, High Dose Seasonal PF 12/17/2015, 02/12/2017, 12/03/2017   Moderna Covid-19 Vaccine Bivalent Booster 15yrs & up 01/23/2021   Moderna SARS-COV2  Booster Vaccination 02/15/2020   Moderna Sars-Covid-2 Vaccination 04/28/2019, 05/27/2019   Pneumococcal Conjugate-13 09/29/2013, 01/28/2017   Pneumococcal Polysaccharide-23 07/21/2010   Td 04/19/2010   Tdap 01/06/2013   Zoster, Live 07/31/2013    Past Surgical History:  Procedure Laterality Date   CHOLECYSTECTOMY     COLONOSCOPY WITH PROPOFOL N/A 11/05/2014   Procedure: COLONOSCOPY WITH PROPOFOL;  Surgeon: Midge Minium, MD;  Location: [facility];  Service: Endoscopy;  Laterality: N/A;   COLONOSCOPY WITH PROPOFOL N/A 06/05/2021   Procedure: COLONOSCOPY WITH PROPOFOL;  Surgeon: Jaynie Collins, DO;  Location: [facility] ENDOSCOPY;  Service: Gastroenterology;  Laterality: N/A;  ELIQUIS   ESOPHAGOGASTRODUODENOSCOPY (EGD) WITH PROPOFOL N/A 06/05/2021   Procedure: ESOPHAGOGASTRODUODENOSCOPY (EGD) WITH PROPOFOL;  Surgeon: Jaynie Collins, DO;  Location: [facility] ENDOSCOPY;  Service: Gastroenterology;  Laterality: N/A;   JOINT REPLACEMENT     left knee x 2 04/2024 and 01/2017 Dr. 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   Procedure: TOTAL KNEE ARTHROPLASTY;  Surgeon: Kennedy Bucker, MD;  Location: [facility] ORS;  Service: Orthopedics;  Laterality: Left;   TOTAL KNEE REVISION Left 02/11/2017   Procedure: TOTAL KNEE REVISION, POLYETHELENE EXCHANGE;  Surgeon: Kennedy Bucker, MD;  Location: [facility] ORS;  Service: Orthopedics;  Laterality: Left;    Social History   Tobacco Use   Smoking status: Never   Smokeless tobacco: Never  Vaping Use   Vaping status: Never Used  Substance Use Topics   Alcohol use: No   Drug use: No    Family History  Problem Relation Age of Onset   Heart disease Mother    Hypertension Mother    Mental illness Mother    Diabetes Mother     Arthritis Mother    Alzheimer's disease Mother    Heart disease Father    Hypertension Sister    Depression Sister    Diabetes Sister    Breast cancer Sister 46   Bladder Cancer Sister    Cancer Sister    Early death Sister    Heart disease Brother    Hypertension Brother    Cancer Brother    Arthritis Daughter    GER disease Daughter    Hypothyroidism Daughter    Lupus Daughter    Depression Daughter    Parkinson's disease Daughter    Asthma Daughter    ADD / ADHD Daughter    Depression Daughter    Learning disabilities Daughter    Anxiety disorder Daughter    Asthma Daughter    Cancer Daughter    Miscarriages / Stillbirths Daughter    Cancer Other        colon cancer, breast cancer    Heart disease Other    Breast cancer Other    Depression Other         10/01/2022    9:32 AM 08/13/2022    9:59 AM 07/08/2022    1:18 PM 06/29/2019   11:20 AM  GAD 7 : Generalized Anxiety Score  Nervous, Anxious, on Edge 2 1 1  0  Control/stop worrying 2 1 2  0  Worry too much - different things 2 1 2  0  Trouble relaxing 1 1 0 0  Restless 0 1 0 0  Easily annoyed or irritable 1 2 2  0  Afraid - awful might happen 0 1 0 0  Total GAD 7 Score 8 8 7  0  Anxiety Difficulty Somewhat difficult Very difficult Very difficult Not difficult at all       10/01/2022    9:31 AM 08/13/2022    9:59 AM 07/08/2022    1:18 PM  Depression screen PHQ 2/9  Decreased Interest 0 0 0  Down, Depressed, Hopeless 2 2 2   PHQ - 2 Score 2 2 2   Altered sleeping 2 2 2   Tired, decreased energy 2 2 2   Change in appetite 0 0 0  Feeling bad or failure about yourself  0 2 0  Trouble concentrating 3 1 0  Moving slowly or fidgety/restless 0 1 0  Suicidal thoughts 0 0 0  PHQ-9 Score 9 10 6   Difficult doing work/chores Somewhat difficult Extremely dIfficult Very difficult    BP Readings from Last 3 Encounters:  10/01/22 122/76  10/01/22 122/76  09/25/22 136/64    Wt Readings from Last 3 Encounters:  10/01/22  144 lb (65.3 kg)  10/01/22 144 lb (65.3 kg)  09/25/22 145 lb 6 oz (65.9 kg)    BP 122/76   Ht 5\' 2"  (1.575 m)   Wt 144 lb (65.3 kg)   SpO2 95%   BMI 26.34 kg/m   Physical Exam  Recent Labs     Component Value Date/Time   NA 139 07/08/2022 1402   K 4.0 07/08/2022 1402   CL 100 07/08/2022 1402   CO2 31 07/08/2022 1402   GLUCOSE 86 07/08/2022 1402   BUN 21 07/08/2022 1402   CREATININE 1.03 07/08/2022 1402   CREATININE 0.97  01/10/2021 1430   CALCIUM 9.3 07/08/2022 1402   PROT 6.7 07/08/2022 1402   ALBUMIN 4.0 07/08/2022 1402   AST 18 07/08/2022 1402   ALT 9 07/08/2022 1402   ALKPHOS 79 07/08/2022 1402   BILITOT 0.8 07/08/2022 1402   GFRNONAA >60 07/23/2019 0114   GFRAA >60 07/23/2019 0114    Lab Results  Component Value Date   WBC 5.2 07/08/2022   HGB 12.2 07/08/2022   HCT 36.1 07/08/2022   MCV 93.4 07/08/2022   PLT 234.0 07/08/2022   Lab Results  Component Value Date   HGBA1C 5.9 07/08/2022   Lab Results  Component Value Date   CHOL 229 (H) 07/08/2022   HDL 75.90 07/08/2022   LDLCALC 138 (H) 07/08/2022   TRIG 75.0 07/08/2022   CHOLHDL 3 07/08/2022   Lab Results  Component Value Date   TSH 2.78 07/08/2022     Assessment and Plan:  There are no diagnoses linked to this encounter.   Return in 1 year (on 10/01/2023).    Alvester Morin, PA-C, DMSc, Nutritionist [facility] [phone]     Subjective:   Gina Frazier is a 77 y.o. female who presents for Medicare Annual (Subsequent) preventive examination.  Visit Complete: In [place]  Patient Medicare AWV questionnaire was completed by the patient on 10/01/2022; I have confirmed that all information answered by patient is correct and no changes since this date.  Review of Systems    Defer to provider       Objective:    Today's Vitals   10/01/22 0938 10/01/22 0940  BP: 122/76   SpO2: 95%   Weight: 144 lb  (65.3 kg)   Height: 5\' 2"  (1.575 m)   PainSc:  0-No pain   Body mass index is 26.34 kg/m.     10/01/2022    9:45 AM 07/30/2021    3:43 PM 06/05/2021    8:48 AM 10/12/2019   10:44 AM 07/22/2019   11:46 PM 07/22/2019   12:45 AM 04/27/2018    2:46 PM  Advanced Directives  Does Patient Have a Medical Advance Directive? No No No No No No   Would patient like information on creating a medical advance directive?  No - Patient declined  No - Patient declined        Information is confidential and restricted. Go to Review Flowsheets to unlock data.    Current Medications (verified) Outpatient Encounter Medications as of 10/01/2022  Medication Sig   acetaminophen (TYLENOL) 500 MG tablet Take 1,000 mg every 8 (eight) hours as needed by mouth for mild pain.   albuterol (PROVENTIL) (2.5 MG/3ML) 0.083% nebulizer solution Take 3 mLs (2.5 mg total) by nebulization every 6 (six) hours as needed for wheezing or shortness of breath.   albuterol (VENTOLIN HFA) 108 (90 Base) MCG/ACT inhaler Inhale 1-2 puffs into the lungs every 6 (six) hours as needed for wheezing or shortness of breath.   alendronate (FOSAMAX) 70 MG tablet Take 1 tablet (70 mg total) by mouth every 7 (seven) days. Take with a full glass of water on an empty stomach.   amLODipine (NORVASC) 10 MG tablet TAKE 1/2 TABLET DAILY. AFTER ONE WEEK IF BLOOD PRESSURE STILL>140, INCREASE UP TO 1 TABLET DAILY   apixaban (ELIQUIS) 5 MG TABS tablet Take 1 tablet (5 mg total) by mouth 2 (two) times daily.   Calcium Carbonate-Vitamin D 600-400 MG-UNIT tablet Take by mouth.  Cholecalciferol (VITAMIN D) 2000 units tablet Take 2,000 Units by mouth daily.   doxazosin (CARDURA) 2 MG tablet Take 1 tablet (2 mg total) by mouth 2 (two) times daily.   flecainide (TAMBOCOR) 50 MG tablet TAKE 1 TABLET(50 MG) BY MOUTH TWICE DAILY   Fluticasone-Umeclidin-Vilant (TRELEGY ELLIPTA) 200-62.5-25 MCG/ACT AEPB Inhale 1 puff into the lungs daily.   hydrALAZINE (APRESOLINE) 25 MG  tablet Take 1 tablet (25 mg total) by mouth 2 (two) times daily as needed (For pressure >160, take  hydralazine 25 mg x 1 If pressure does not come down after 2 hours, take an additional hydralazine).   ipratropium (ATROVENT) 0.06 % nasal spray Place 2 sprays into both nostrils 3 (three) times daily.   memantine (NAMENDA) 5 MG tablet Take 5 mg by mouth 2 (two) times daily.   methocarbamol (ROBAXIN) 500 MG tablet Take 500 mg by mouth 2 (two) times daily.   Multiple Vitamins-Minerals (MULTIVITAMIN WITH MINERALS) tablet Take 1 tablet by mouth daily.   nitroGLYCERIN (NITROSTAT) 0.4 MG SL tablet Place 1 tablet (0.4 mg total) under the tongue every 5 (five) minutes as needed for chest pain.   propranolol ER (INDERAL LA) 60 MG 24 hr capsule TAKE 1 CAPSULE(60 MG) BY MOUTH DAILY   sodium chloride (OCEAN) 0.65 % SOLN nasal spray Place 1-2 sprays into both nostrils daily as needed for congestion.   telmisartan (MICARDIS) 40 MG tablet TAKE 60 MG(1 AND 1/2 TABLET) NIGHTLY   traZODone (DESYREL) 50 MG tablet Take 0.5-1 tablets (25-50 mg total) by mouth at bedtime as needed for sleep.   No facility-administered encounter medications on file as of 10/01/2022.    Allergies (verified) Fish allergy and Pravastatin   History: Past Medical History:  Diagnosis Date   Allergy    Anxiety    Asthma    Bell's palsy    left   Cataract 11/14/2021   Chronic back pain    Depression 09/13/2021   Family history of adverse reaction to anesthesia    pts daughter had severe vomiting 2014    Hip joint pain    left   HLD (hyperlipidemia)    Hypertension    Neuropathy    Osteoarthritis    PAF (paroxysmal atrial fibrillation) (HCC)    a. initially diagnosed 04/2014; b. on Eliquis; c. CHADS2VASc = 3   Palpitations    Sleep apnea 08/15/2022   Wears contact lenses    Wears dentures    partial upper   Past Surgical History:  Procedure Laterality Date   CHOLECYSTECTOMY     COLONOSCOPY WITH PROPOFOL N/A 11/05/2014    Procedure: COLONOSCOPY WITH PROPOFOL;  Surgeon: Midge Minium, MD;  Location: [facility];  Service: Endoscopy;  Laterality: N/A;   COLONOSCOPY WITH PROPOFOL N/A 06/05/2021   Procedure: COLONOSCOPY WITH PROPOFOL;  Surgeon: Jaynie Collins, DO;  Location: [facility] ENDOSCOPY;  Service: Gastroenterology;  Laterality: N/A;  ELIQUIS   ESOPHAGOGASTRODUODENOSCOPY (EGD) WITH PROPOFOL N/A 06/05/2021   Procedure: ESOPHAGOGASTRODUODENOSCOPY (EGD) WITH PROPOFOL;  Surgeon: Jaynie Collins, DO;  Location: [facility] ENDOSCOPY;  Service: Gastroenterology;  Laterality: N/A;   JOINT REPLACEMENT     left knee x 2 04/2024 and 01/2017 Dr. 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   Procedure: TOTAL KNEE ARTHROPLASTY;  Surgeon: Casimiro Needle  Rosita Kea, MD;  Location: [facility] ORS;  Service: Orthopedics;  Laterality: Left;   TOTAL KNEE REVISION Left 02/11/2017   Procedure: TOTAL KNEE REVISION, POLYETHELENE EXCHANGE;  Surgeon: Kennedy Bucker, MD;  Location: [facility] ORS;  Service: Orthopedics;  Laterality: Left;   Family History  Problem Relation Age of Onset   Heart disease Mother    Hypertension Mother    Mental illness Mother    Diabetes Mother    Arthritis Mother    Alzheimer's disease Mother    Heart disease Father    Hypertension Sister    Depression Sister    Diabetes Sister    Breast cancer Sister 57   Bladder Cancer Sister    Cancer Sister    Early death Sister    Heart disease Brother    Hypertension Brother    Cancer Brother    Arthritis Daughter    GER disease Daughter    Hypothyroidism Daughter    Lupus Daughter    Depression Daughter    Parkinson's disease Daughter    Asthma Daughter    ADD / ADHD Daughter    Depression Daughter    Learning disabilities Daughter     Anxiety disorder Daughter    Asthma Daughter    Cancer Daughter    Miscarriages / [place] Daughter    Cancer Other        colon cancer, breast cancer    Heart disease Other    Breast cancer Other    Depression Other    Social History   Socioeconomic History   Marital status: Divorced    Spouse name: Not on file   Number of children: 2   Years of education: Not on file   Highest education level: Not on file  Occupational History   Not on file  Tobacco Use   Smoking status: Never   Smokeless tobacco: Never  Vaping Use   Vaping status: Never Used  Substance and Sexual Activity   Alcohol use: No   Drug use: No   Sexual activity: Not Currently    Birth control/protection: Abstinence  Other Topics Concern   Not on file  Social History Narrative   2 daughters 1 lives [place] the other [place] pt visits both    Lives with daughter in [place] and grand daughter       Social Determinants of Health   Financial Resource Strain: Low Risk  (10/01/2022)   Overall Financial Resource Strain (CARDIA)    Difficulty of Paying Living Expenses: Not hard at all  Food Insecurity: No Food Insecurity (10/01/2022)   Hunger Vital Sign    Worried About Running Out of Food in the Last Year: Never true    Ran Out of Food in the Last Year: Never true  Transportation Needs: No Transportation Needs (10/01/2022)   PRAPARE - Administrator, Civil Service (Medical): No    Lack of Transportation (Non-Medical): No  Physical Activity: Inactive (10/01/2022)   Exercise Vital Sign    Days of Exercise per Week: 0 days    Minutes of Exercise per Session: 0 min  Stress: No Stress Concern Present (10/01/2022)   [employer] of Occupational Health - Occupational Stress Questionnaire    Feeling of Stress : Only a little  Social Connections: Unknown (07/30/2021)   Social Connection and Isolation Panel [NHANES]    Frequency of Communication with Friends and Family: Not on file    Frequency  of Social Gatherings with Friends and Family: Not on file  Attends Religious Services: Never    Active Member of Clubs or Organizations: No    Attends Banker Meetings: Never    Marital Status: Divorced    Tobacco Counseling Counseling given: Not Answered   Clinical Intake:  Pre-visit preparation completed: Yes  Pain : No/denies pain Pain Score: 0-No pain     Diabetes: No  How often do you need to have someone help you when you read instructions, pamphlets, or other written materials from your doctor or pharmacy?: 1 - Never  Interpreter Needed?: No      Activities of Daily Living    10/01/2022    9:41 AM  In your present state of health, do you have any difficulty performing the following activities:  Hearing? 0  Vision? 0  Difficulty concentrating or making decisions? 1  Walking or climbing stairs? 1  Dressing or bathing? 1  Doing errands, shopping? 1  Preparing Food and eating ? N  Using the Toilet? N  In the past six months, have you accidently leaked urine? N  Do you have problems with loss of bowel control? N  Managing your Medications? Y  Managing your Finances? Y  Housekeeping or managing your Housekeeping? Y    Patient Care Team: Remo Lipps, PA as PCP - General (Physician Assistant) Antonieta Iba, MD (Cardiology) Salena Saner, MD as Consulting Physician (Pulmonary Disease)  Indicate any recent Medical Services you may have received from other than [facility] providers in the past year (date may be approximate).     Assessment:   This is a routine wellness examination for Gina Frazier.  Hearing/Vision screen Hearing Screening - Comments:: No hearing problems   Dietary issues and exercise activities discussed:     Goals Addressed             This Visit's Progress    Increase physical activity        Depression Screen    10/01/2022    9:31 AM 08/13/2022    9:59 AM 07/08/2022    1:18 PM 08/14/2021    1:55 PM 08/13/2021     1:48 PM 07/30/2021    3:41 PM 01/10/2021    1:24 PM  PHQ 2/9 Scores  PHQ - 2 Score 2 2 2  0 0 0 1  PHQ- 9 Score 9 10 6         Fall Risk    10/01/2022    9:33 AM 08/13/2022    9:49 AM 07/08/2022    1:17 PM 08/14/2021    1:55 PM 08/13/2021    1:48 PM  Fall Risk   Falls in the past year? 1 1 0 0 1  Number falls in past yr: 1 1 0 1 1  Injury with Fall? 1 1 0 1 0  Risk for fall due to : History of fall(s) History of fall(s) No Fall Risks History of fall(s) History of fall(s)  Follow up Falls evaluation completed Falls evaluation completed Falls evaluation completed Falls evaluation completed Falls evaluation completed    MEDICARE RISK AT HOME:  Medicare Risk at Home - 10/01/22 0946     Any stairs in or around the home? Yes    If so, are there any without handrails? No    Home free of loose throw rugs in walkways, pet beds, electrical cords, etc? Yes    Adequate lighting in your home to reduce risk of falls? Yes    Life alert? No    Use of a cane, walker  or w/c? No    Grab bars in the bathroom? Yes    Shower chair or bench in shower? No    Elevated toilet seat or a handicapped toilet? No             TIMED UP AND GO:  Was the test performed?  Yes  Length of time to ambulate 10 feet: 15 sec Gait slow and steady without use of assistive device    Cognitive Function:        10/01/2022    9:46 AM 10/12/2019   10:57 AM  6CIT Screen  What Year? 0 points 0 points  What month? 0 points 0 points  What time? 0 points   Count back from 20 0 points   Months in reverse 0 points 4 points  Repeat phrase 4 points 4 points  Total Score 4 points     Immunizations Immunization History  Administered Date(s) Administered   Fluad Quad(high Dose 65+) 11/10/2018, 12/11/2019, 12/05/2020, 11/21/2021   Hepatitis B 03/16/2014, 10/11/2014, 11/25/2014, 07/29/2015   Hepatitis B, ADULT 03/16/2014, 10/11/2014, 11/25/2014, 07/29/2015   Influenza Split 01/05/2011, 12/23/2011, 01/06/2013    Influenza Whole 04/19/2010   Influenza, High Dose Seasonal PF 12/17/2015, 02/12/2017, 12/03/2017   Moderna Covid-19 Vaccine Bivalent Booster 60yrs & up 01/23/2021   Moderna SARS-COV2 Booster Vaccination 02/15/2020   Moderna Sars-Covid-2 Vaccination 04/28/2019, 05/27/2019   Pneumococcal Conjugate-13 09/29/2013, 01/28/2017   Pneumococcal Polysaccharide-23 07/21/2010   Td 04/19/2010   Tdap 01/06/2013   Zoster, Live 07/31/2013    TDAP status: Up to date  Flu Vaccine status: Up to date  Pneumococcal vaccine status: Up to date  Covid-19 vaccine status: Completed vaccines  Qualifies for Shingles Vaccine? Yes   Zostavax completed No   Shingrix Completed?: No.    Education has been provided regarding the importance of this vaccine. Patient has been advised to call insurance company to determine out of pocket expense if they have not yet received this vaccine. Advised may also receive vaccine at local pharmacy or Health Dept. Verbalized acceptance and understanding.  Screening Tests Health Maintenance  Topic Date Due   Zoster Vaccines- Shingrix (1 of 2) 08/09/1964   COVID-19 Vaccine (4 - 2023-24 season) 10/17/2022 (Originally 11/14/2021)   INFLUENZA VACCINE  10/15/2022   DTaP/Tdap/Td (3 - Td or Tdap) 01/07/2023   Medicare Annual Wellness (AWV)  10/01/2023   Pneumonia Vaccine 82+ Years old  Completed   DEXA SCAN  Completed   Hepatitis C Screening  Completed   HPV VACCINES  Aged Out   Colonoscopy  Discontinued    Health Maintenance  Health Maintenance Due  Topic Date Due   Zoster Vaccines- Shingrix (1 of 2) 08/09/1964    Colorectal cancer screening: No longer required.     Bone Density status: Completed 12/29/2021. Results reflect: Bone density results: OSTEOPENIA. Repeat every 2-3 years.  Lung Cancer Screening: (Low Dose CT Chest recommended if Age 72-80 years, 20 pack-year currently smoking OR have quit w/in 15years.) does not qualify.   Lung Cancer Screening Referral:  N/A  Additional Screening:  Hepatitis C Screening: does qualify; Completed 03/29/2017  Vision Screening: Recommended annual ophthalmology exams for early detection of glaucoma and other disorders of the eye. Is the patient up to date with their annual eye exam?  Yes  Who is the provider or what is the name of the office in which the patient attends annual eye exams? Dr. Alex Gardener If pt is not established with a provider, would they like to be  referred to a provider to establish care? No .   Dental Screening: Recommended annual dental exams for proper oral hygiene  Diabetic Foot Exam:   Community Resource Referral / Chronic Care Management: CRR required this visit?  No   CCM required this visit?  No     Plan:     I have personally reviewed and noted the following in the patient's chart:   Medical and social history Use of alcohol, tobacco or illicit drugs  Current medications and supplements including opioid prescriptions. Patient is not currently taking opioid prescriptions. Functional ability and status Nutritional status Physical activity Advanced directives List of other physicians Hospitalizations, surgeries, and ER visits in previous 12 months Vitals Screenings to include cognitive, depression, and falls Referrals and appointments  In addition, I have reviewed and discussed with patient certain preventive protocols, quality metrics, and best practice recommendations. A written personalized care plan for preventive services as well as general preventive health recommendations were provided to patient.     Eulis Canner Andric Kerce, CMA   10/01/2022   After Visit Summary: Printed in office  Nurse Notes: none

## 2022-10-06 ENCOUNTER — Other Ambulatory Visit: Payer: Self-pay | Admitting: Family

## 2022-10-12 ENCOUNTER — Ambulatory Visit: Admission: RE | Admit: 2022-10-12 | Payer: Medicare Other | Source: Ambulatory Visit

## 2022-10-12 ENCOUNTER — Ambulatory Visit (INDEPENDENT_AMBULATORY_CARE_PROVIDER_SITE_OTHER): Payer: Medicare Other | Admitting: Physician Assistant

## 2022-10-12 ENCOUNTER — Ambulatory Visit
Admission: RE | Admit: 2022-10-12 | Discharge: 2022-10-12 | Disposition: A | Discharge status: Home / Self Care | Payer: Medicare Other | Attending: Physician Assistant | Admitting: Physician Assistant

## 2022-10-12 VITALS — BP 112/60 | HR 60 | Temp 98.1°F | Ht 62.0 in | Wt 147.0 lb

## 2022-10-12 DIAGNOSIS — M5126 Other intervertebral disc displacement, lumbar region: Secondary | ICD-10-CM | POA: Diagnosis not present

## 2022-10-12 DIAGNOSIS — M47816 Spondylosis without myelopathy or radiculopathy, lumbar region: Secondary | ICD-10-CM | POA: Diagnosis not present

## 2022-10-12 DIAGNOSIS — M8580 Other specified disorders of bone density and structure, unspecified site: Secondary | ICD-10-CM | POA: Diagnosis not present

## 2022-10-12 DIAGNOSIS — M5137 Other intervertebral disc degeneration, lumbosacral region: Secondary | ICD-10-CM | POA: Diagnosis not present

## 2022-10-12 DIAGNOSIS — N183 Chronic kidney disease, stage 3 unspecified: Secondary | ICD-10-CM | POA: Insufficient documentation

## 2022-10-12 DIAGNOSIS — M5136 Other intervertebral disc degeneration, lumbar region: Secondary | ICD-10-CM | POA: Diagnosis not present

## 2022-10-12 DIAGNOSIS — M25551 Pain in right hip: Secondary | ICD-10-CM | POA: Diagnosis not present

## 2022-10-12 DIAGNOSIS — M5441 Lumbago with sciatica, right side: Secondary | ICD-10-CM | POA: Insufficient documentation

## 2022-10-12 MED ORDER — LIDOCAINE 4 % EX PTCH
1.0000 | MEDICATED_PATCH | CUTANEOUS | 0 refills | Status: AC
Start: 2022-10-12 — End: ?

## 2022-10-12 NOTE — Progress Notes (Addendum)
Date:  10/12/2022   Name:  Gina Frazier   DOB:  04/19/1945   MRN:  [id number]   Chief Complaint: Leg Pain  Leg Pain  Incident onset: X4 days. There was no injury mechanism. The pain is present in the right leg. The quality of the pain is described as shooting. The pain is at a severity of 8/10. The pain is moderate. The pain has been Constant since onset. Associated symptoms include tingling. She reports no foreign bodies present. The symptoms are aggravated by movement and weight bearing. She has tried acetaminophen for the symptoms. The treatment provided mild relief.   Gina Frazier presents today joined by her daughter for evaluation of recent radiating right leg pain over the past 4 days, constant, not significantly relieved or worsened in any specific position.  At times it radiates as far down as the right ankle.  She has a history of spinal conditions including chronic back pain and "advanced multilevel lumbar facet arthrosis" according to x-ray July 2021.  Pain is affecting gait and causing her to stumble some.  She recently got LifeAlert in case of falls.  Her daughter thinks she spends too much time standing in the kitchen, usually preparing food.  Tylenol and topical Voltaren have had minimal benefit for the current presentation.  Oral NSAIDs not used due to concurrent use of Eliquis and also stage 3 renal insufficiency.  Prednisone has been fairly well-tolerated in the past for asthma.  Presentation also complicated by osteoporosis.  She did have a fall about 2 months ago which was evaluated by neurologist Dr. Sherryll Burger with apparently normal right arm x-ray and no neurologic deficits.   Medication list has been reviewed and updated.  Current Meds  Medication Sig   acetaminophen (TYLENOL) 500 MG tablet Take 1,000 mg every 8 (eight) hours as needed by mouth for mild pain.   albuterol (PROVENTIL) (2.5 MG/3ML) 0.083% nebulizer solution Take 3 mLs (2.5 mg total) by nebulization every 6 (six)  hours as needed for wheezing or shortness of breath.   albuterol (VENTOLIN HFA) 108 (90 Base) MCG/ACT inhaler Inhale 1-2 puffs into the lungs every 6 (six) hours as needed for wheezing or shortness of breath.   alendronate (FOSAMAX) 70 MG tablet Take 1 tablet (70 mg total) by mouth every 7 (seven) days. Take with a full glass of water on an empty stomach.   amLODipine (NORVASC) 10 MG tablet TAKE 1/2 TABLET DAILY. AFTER ONE WEEK IF BLOOD PRESSURE STILL>140, INCREASE UP TO 1 TABLET DAILY   apixaban (ELIQUIS) 5 MG TABS tablet Take 1 tablet (5 mg total) by mouth 2 (two) times daily.   Calcium Carbonate-Vitamin D 600-400 MG-UNIT tablet Take by mouth.   Cholecalciferol (VITAMIN D) 2000 units tablet Take 2,000 Units by mouth daily.   doxazosin (CARDURA) 2 MG tablet Take 1 tablet (2 mg total) by mouth 2 (two) times daily.   flecainide (TAMBOCOR) 50 MG tablet TAKE 1 TABLET(50 MG) BY MOUTH TWICE DAILY   Fluticasone-Umeclidin-Vilant (TRELEGY ELLIPTA) 200-62.5-25 MCG/ACT AEPB Inhale 1 puff into the lungs daily.   hydrALAZINE (APRESOLINE) 25 MG tablet Take 1 tablet (25 mg total) by mouth 2 (two) times daily as needed (For pressure >160, take  hydralazine 25 mg x 1 If pressure does not come down after 2 hours, take an additional hydralazine).   ipratropium (ATROVENT) 0.06 % nasal spray Place 2 sprays into both nostrils 3 (three) times daily.   lidocaine 4 % Place 1 patch onto the skin  daily. Apply locally to lower back   memantine (NAMENDA) 5 MG tablet Take 5 mg by mouth 2 (two) times daily.   methocarbamol (ROBAXIN) 500 MG tablet Take 500 mg by mouth 2 (two) times daily.   Multiple Vitamins-Minerals (MULTIVITAMIN WITH MINERALS) tablet Take 1 tablet by mouth daily.   nitroGLYCERIN (NITROSTAT) 0.4 MG SL tablet Place 1 tablet (0.4 mg total) under the tongue every 5 (five) minutes as needed for chest pain.   propranolol ER (INDERAL LA) 60 MG 24 hr capsule TAKE 1 CAPSULE(60 MG) BY MOUTH DAILY   rosuvastatin  (CRESTOR) 5 MG tablet Take 1 tablet (5 mg total) by mouth every Monday, Wednesday, and Friday.   sodium chloride (OCEAN) 0.65 % SOLN nasal spray Place 1-2 sprays into both nostrils daily as needed for congestion.   telmisartan (MICARDIS) 40 MG tablet TAKE 60 MG(1 AND 1/2 TABLET) NIGHTLY   traZODone (DESYREL) 50 MG tablet Take 0.5-1 tablets (25-50 mg total) by mouth at bedtime as needed for sleep.     Review of Systems  Constitutional:  Negative for fatigue and fever.  Respiratory:  Negative for chest tightness and shortness of breath.   Cardiovascular:  Negative for chest pain and palpitations.  Gastrointestinal:  Negative for abdominal pain.  Musculoskeletal:  Positive for back pain and gait problem.       Right hip/leg pain  Neurological:  Positive for tingling.    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   Osteoporosis 07/29/2017   Orthostatic hypotension 07/05/2017   Bradycardia 07/05/2017   Essential tremor 03/31/2017   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   Chronic bilateral low back pain 12/12/2015   PAF (paroxysmal atrial fibrillation) (HCC)    Primary osteoarthritis of knee 05/02/2015   Hypertension 07/21/2010   Vitamin D deficiency 07/21/2010    Allergies  Allergen Reactions   Fish Allergy Anaphylaxis, Nausea And Vomiting and Rash   Iodinated Contrast Media Diarrhea   Pravastatin      Diarrhea/constipation per pt      Immunization History  Administered Date(s) Administered   Fluad Quad(high Dose 65+) 11/10/2018, 12/11/2019, 12/05/2020, 11/21/2021   Hepatitis B 03/16/2014, 10/11/2014, 11/25/2014, 07/29/2015   Hepatitis B, ADULT 03/16/2014, 10/11/2014, 11/25/2014, 07/29/2015   Influenza Split 01/05/2011, 12/23/2011, 01/06/2013   Influenza Whole 04/19/2010   Influenza, High Dose Seasonal PF 12/17/2015, 02/12/2017, 12/03/2017   Moderna Covid-19 Vaccine Bivalent Booster 66yrs & up 01/23/2021   Moderna SARS-COV2 Booster Vaccination 02/15/2020   Moderna Sars-Covid-2 Vaccination 04/28/2019, 05/27/2019   Pneumococcal Conjugate-13 09/29/2013, 01/28/2017   Pneumococcal Polysaccharide-23 07/21/2010   Td 04/19/2010   Tdap 01/06/2013   Zoster, Live 07/31/2013    Past Surgical History:  Procedure Laterality Date   CHOLECYSTECTOMY     COLONOSCOPY WITH PROPOFOL N/A 11/05/2014   Procedure: COLONOSCOPY WITH PROPOFOL;  Surgeon: Midge Minium, MD;  Location: [facility];  Service: Endoscopy;  Laterality: N/A;   COLONOSCOPY WITH PROPOFOL N/A 06/05/2021   Procedure: COLONOSCOPY WITH PROPOFOL;  Surgeon: Jaynie Collins, DO;  Location: [facility];  Service: Gastroenterology;  Laterality: N/A;  ELIQUIS   ESOPHAGOGASTRODUODENOSCOPY (EGD) WITH PROPOFOL N/A 06/05/2021   Procedure: ESOPHAGOGASTRODUODENOSCOPY (EGD) WITH PROPOFOL;  Surgeon: Jaynie Collins, DO;  Location: [facility] ENDOSCOPY;  Service: Gastroenterology;  Laterality: N/A;   JOINT REPLACEMENT     left knee x 2 04/2024 and 01/2017 Dr. 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   Procedure: TOTAL KNEE ARTHROPLASTY;  Surgeon: Kennedy Bucker, MD;  Location: [facility];  Service: Orthopedics;   Laterality: Left;   TOTAL KNEE REVISION Left 02/11/2017   Procedure: TOTAL KNEE REVISION, POLYETHELENE EXCHANGE;  Surgeon: Kennedy Bucker, MD;  Location: [facility];  Service: Orthopedics;  Laterality: Left;    Social History   Tobacco Use   Smoking status: Never   Smokeless tobacco: Never  Vaping Use   Vaping status: Never Used  Substance Use Topics   Alcohol use: No   Drug use: No    Family History  Problem Relation Age of Onset   Heart disease Mother    Hypertension Mother    Mental illness Mother    Diabetes Mother    Arthritis Mother    Alzheimer's disease Mother    Heart disease Father    Hypertension Sister    Depression Sister    Diabetes Sister    Breast cancer Sister 45   Bladder Cancer Sister    Cancer Sister    Early death Sister    Heart disease Brother    Hypertension Brother    Cancer Brother    Arthritis Daughter    GER disease Daughter    Hypothyroidism Daughter    Lupus Daughter    Depression Daughter    Parkinson's disease Daughter    Asthma Daughter    ADD / ADHD Daughter    Depression Daughter    Learning disabilities Daughter    Anxiety disorder Daughter    Asthma Daughter    Cancer Daughter    Miscarriages / Stillbirths Daughter    Cancer Other        colon cancer, breast cancer    Heart disease Other    Breast cancer Other    Depression Other         10/12/2022    1:23 PM 10/01/2022    9:32 AM 08/13/2022    9:59 AM 07/08/2022    1:18 PM  GAD 7 : Generalized Anxiety Score  Nervous, Anxious, on Edge 2 2 1 1   Control/stop worrying 1 2 1 2   Worry too much - different things 1 2 1 2   Trouble relaxing 1 1 1  0  Restless 0 0 1 0  Easily annoyed or irritable 2 1 2 2   Afraid - awful might happen 1 0 1 0  Total GAD 7 Score 8 8 8 7   Anxiety Difficulty Somewhat difficult Somewhat difficult Very difficult Very difficult       10/12/2022    1:23 PM 10/01/2022    9:31 AM 08/13/2022    9:59 AM  Depression screen PHQ 2/9  Decreased Interest  0 0 0  Down, Depressed, Hopeless 1 2 2   PHQ - 2 Score 1 2 2   Altered sleeping 1 2 2   Tired, decreased energy 1 2 2  Change in appetite 0 0 0  Feeling bad or failure about yourself  0 0 2  Trouble concentrating 0 3 1  Moving slowly or fidgety/restless 0 0 1  Suicidal thoughts 0 0 0  PHQ-9 Score 3 9 10   Difficult doing work/chores Somewhat difficult Somewhat difficult Extremely dIfficult    BP Readings from Last 3 Encounters:  10/12/22 112/60  10/01/22 (!) 152/66  10/01/22 122/76    Wt Readings from Last 3 Encounters:  10/12/22 147 lb (66.7 kg)  10/01/22 144 lb (65.3 kg)  10/01/22 144 lb (65.3 kg)    BP 112/60   Pulse 60   Temp 98.1 F (36.7 C) (Oral)   Ht 5\' 2"  (1.575 m)   Wt 147 lb (66.7 kg)   SpO2 96%   BMI 26.89 kg/m   Physical Exam Vitals and nursing note reviewed.  Constitutional:      Appearance: Normal appearance.  Cardiovascular:     Rate and Rhythm: Normal rate and regular rhythm.     Heart sounds: No murmur heard.    No friction rub. No gallop.  Pulmonary:     Effort: Pulmonary effort is normal.     Breath sounds: Normal breath sounds.  Musculoskeletal:     Comments: Tenderness of the RLE, especially about the anterolateral aspect and into the upper buttock/lower back  Neurological:     Comments: Positive straight leg raise on right.  Left leg raise simply reproduces her chronic back pain     Recent Labs     Component Value Date/Time   NA 139 07/08/2022 1402   K 4.0 07/08/2022 1402   CL 100 07/08/2022 1402   CO2 31 07/08/2022 1402   GLUCOSE 86 07/08/2022 1402   BUN 21 07/08/2022 1402   CREATININE 1.03 07/08/2022 1402   CREATININE 0.97 01/10/2021 1430   CALCIUM 9.3 07/08/2022 1402   PROT 6.7 07/08/2022 1402   ALBUMIN 4.0 07/08/2022 1402   AST 18 07/08/2022 1402   ALT 9 07/08/2022 1402   ALKPHOS 79 07/08/2022 1402   BILITOT 0.8 07/08/2022 1402   GFRNONAA >60 07/23/2019 0114   GFRAA >60 07/23/2019 0114    Lab Results  Component Value  Date   WBC 5.2 07/08/2022   HGB 12.2 07/08/2022   HCT 36.1 07/08/2022   MCV 93.4 07/08/2022   PLT 234.0 07/08/2022   Lab Results  Component Value Date   HGBA1C 5.9 07/08/2022   Lab Results  Component Value Date   CHOL 229 (H) 07/08/2022   HDL 75.90 07/08/2022   LDLCALC 138 (H) 07/08/2022   TRIG 75.0 07/08/2022   CHOLHDL 3 07/08/2022   Lab Results  Component Value Date   TSH 2.78 07/08/2022     Assessment and Plan:  1. Acute right-sided low back pain with right-sided sciatica Low suspicion for traumatic fracture given the timing, but will evaluate for pathologic fracture with osteoporosis.  No lumbar x-ray in the last 3+ years.  Obtaining x-ray of lumbar spine and right hip today.  Discussed likely etiology of nerve compression especially given radiation down past the knee; consider structural versus inflammatory etiology.  Oral NSAIDs contraindicated but prednisone may prove useful.  For now, I think the best approach would be to treat conservatively with acetaminophen, topical lidocaine, topical diclofenac, gentle stretching (while seated or supine) and local heat/ice.  Patient to give status report in the next 1 to 2 days with consideration of prednisone at that time.  - DG Lumbar Spine Complete -  DG Hip Unilat W OR W/O Pelvis 2-3 Views Right - lidocaine 4 %; Place 1 patch onto the skin daily. Apply locally to lower back  Dispense: 10 patch; Refill: 0   Return if symptoms worsen or fail to improve.    Alvester Morin, PA-C, DMSc, Nutritionist [facility] and Sports [facility] [phone]

## 2022-10-12 NOTE — Telephone Encounter (Signed)
FYI  KP

## 2022-10-14 ENCOUNTER — Other Ambulatory Visit: Payer: Self-pay | Admitting: Physician Assistant

## 2022-10-14 DIAGNOSIS — G4733 Obstructive sleep apnea (adult) (pediatric): Secondary | ICD-10-CM | POA: Diagnosis not present

## 2022-10-14 NOTE — Telephone Encounter (Signed)
FYI  KP

## 2022-11-04 DIAGNOSIS — M810 Age-related osteoporosis without current pathological fracture: Secondary | ICD-10-CM | POA: Diagnosis not present

## 2022-11-10 DIAGNOSIS — M810 Age-related osteoporosis without current pathological fracture: Secondary | ICD-10-CM | POA: Diagnosis not present

## 2022-11-11 ENCOUNTER — Other Ambulatory Visit: Payer: Self-pay | Admitting: Physician Assistant

## 2022-11-11 DIAGNOSIS — R059 Cough, unspecified: Secondary | ICD-10-CM

## 2022-11-12 NOTE — Telephone Encounter (Signed)
Please review. Pt wants refill on albuterol and baclofen.  KP

## 2022-11-12 NOTE — Telephone Encounter (Signed)
Please review.  KP

## 2022-11-12 NOTE — Telephone Encounter (Signed)
Requested medication (s) are due for refill today: routing for approval  Requested medication (s) are on the active medication list: yes  Last refill:  07/15/21 for Ventolin inhaler  Future visit scheduled: no  Notes to clinic:  Unable to refill per protocol, last refill by another provider.  Routing to PCP for approval.     Requested Prescriptions  Pending Prescriptions Disp Refills   VENTOLIN HFA 108 (90 Base) MCG/ACT inhaler [Pharmacy Med Name: VENTOLIN HFA INH W/DOS CTR 200PUFFS] 18 g 12    Sig: INHALE 1 TO 2 PUFFS INTO THE LUNGS EVERY 6 HOURS AS NEEDED FOR WHEEZING OR SHORTNESS OF BREATH     There is no refill protocol information for this order     Baclofen 5 MG TABS [Pharmacy Med Name: BACLOFEN 5MG  TABLETS] 45 tablet     Sig: TAKE 1 TABLET(5 MG) BY MOUTH TWICE DAILY AS NEEDED     There is no refill protocol information for this order

## 2022-11-14 DIAGNOSIS — G4733 Obstructive sleep apnea (adult) (pediatric): Secondary | ICD-10-CM | POA: Diagnosis not present

## 2022-11-19 NOTE — Telephone Encounter (Signed)
Please review.  KP

## 2022-11-19 NOTE — Telephone Encounter (Signed)
FYI  KP

## 2022-11-20 ENCOUNTER — Ambulatory Visit (INDEPENDENT_AMBULATORY_CARE_PROVIDER_SITE_OTHER): Payer: Medicare Other | Admitting: Physician Assistant

## 2022-11-20 ENCOUNTER — Other Ambulatory Visit: Payer: Self-pay | Admitting: Physician Assistant

## 2022-11-20 ENCOUNTER — Encounter: Payer: Self-pay | Admitting: Physician Assistant

## 2022-11-20 ENCOUNTER — Other Ambulatory Visit
Admission: RE | Admit: 2022-11-20 | Discharge: 2022-11-20 | Disposition: A | Discharge status: Home / Self Care | Payer: Medicare Other | Attending: Physician Assistant | Admitting: Physician Assistant

## 2022-11-20 VITALS — BP 124/62 | HR 52 | Temp 97.3°F | Ht 62.0 in | Wt 144.0 lb

## 2022-11-20 DIAGNOSIS — R5383 Other fatigue: Secondary | ICD-10-CM | POA: Diagnosis not present

## 2022-11-20 DIAGNOSIS — N183 Chronic kidney disease, stage 3 unspecified: Secondary | ICD-10-CM | POA: Insufficient documentation

## 2022-11-20 DIAGNOSIS — Z1321 Encounter for screening for nutritional disorder: Secondary | ICD-10-CM | POA: Insufficient documentation

## 2022-11-20 DIAGNOSIS — R531 Weakness: Secondary | ICD-10-CM | POA: Insufficient documentation

## 2022-11-20 DIAGNOSIS — E559 Vitamin D deficiency, unspecified: Secondary | ICD-10-CM

## 2022-11-20 LAB — CBC WITH DIFFERENTIAL/PLATELET
Abs Immature Granulocytes: 0.02 10*3/uL (ref 0.00–0.07)
Basophils Absolute: 0 10*3/uL (ref 0.0–0.1)
Basophils Relative: 1 %
Eosinophils Absolute: 0.1 10*3/uL (ref 0.0–0.5)
Eosinophils Relative: 2 %
HCT: 32.7 % — ABNORMAL LOW (ref 36.0–46.0)
Hemoglobin: 10.7 g/dL — ABNORMAL LOW (ref 12.0–15.0)
Immature Granulocytes: 0 %
Lymphocytes Relative: 21 %
Lymphs Abs: 1.3 10*3/uL (ref 0.7–4.0)
MCH: 31.5 pg (ref 26.0–34.0)
MCHC: 32.7 g/dL (ref 30.0–36.0)
MCV: 96.2 fL (ref 80.0–100.0)
Monocytes Absolute: 0.4 10*3/uL (ref 0.1–1.0)
Monocytes Relative: 6 %
Neutro Abs: 4.2 10*3/uL (ref 1.7–7.7)
Neutrophils Relative %: 70 %
Platelets: 196 10*3/uL (ref 150–400)
RBC: 3.4 MIL/uL — ABNORMAL LOW (ref 3.87–5.11)
RDW: 12.8 % (ref 11.5–15.5)
WBC: 6 10*3/uL (ref 4.0–10.5)
nRBC: 0 % (ref 0.0–0.2)

## 2022-11-20 LAB — COMPREHENSIVE METABOLIC PANEL
ALT: 13 U/L (ref 0–44)
AST: 20 U/L (ref 15–41)
Albumin: 4.2 g/dL (ref 3.5–5.0)
Alkaline Phosphatase: 74 U/L (ref 38–126)
Anion gap: 8 (ref 5–15)
BUN: 21 mg/dL (ref 8–23)
CO2: 26 mmol/L (ref 22–32)
Calcium: 8.9 mg/dL (ref 8.9–10.3)
Chloride: 101 mmol/L (ref 98–111)
Creatinine, Ser: 1.07 mg/dL — ABNORMAL HIGH (ref 0.44–1.00)
GFR, Estimated: 53 mL/min — ABNORMAL LOW (ref >60)
Glucose, Bld: 100 mg/dL — ABNORMAL HIGH (ref 70–99)
Potassium: 4 mmol/L (ref 3.5–5.1)
Sodium: 135 mmol/L (ref 135–145)
Total Bilirubin: 0.8 mg/dL (ref 0.3–1.2)
Total Protein: 7.4 g/dL (ref 6.5–8.1)

## 2022-11-20 LAB — POCT URINALYSIS DIPSTICK
Bilirubin, UA: NEGATIVE
Blood, UA: NEGATIVE
Glucose, UA: NEGATIVE
Ketones, UA: NEGATIVE
Leukocytes, UA: NEGATIVE
Nitrite, UA: NEGATIVE
Protein, UA: NEGATIVE
Spec Grav, UA: 1.025 (ref 1.010–1.025)
Urobilinogen, UA: 0.2 U/dL
pH, UA: 6 (ref 5.0–8.0)

## 2022-11-20 LAB — VITAMIN B12: Vitamin B-12: 917 pg/mL — ABNORMAL HIGH (ref 180–914)

## 2022-11-20 LAB — FOLATE: Folate: 28 ng/mL (ref >5.9)

## 2022-11-20 LAB — VITAMIN D 25 HYDROXY (VIT D DEFICIENCY, FRACTURES): Vit D, 25-Hydroxy: 51.35 ng/mL (ref 30–100)

## 2022-11-20 LAB — TSH: TSH: 2.285 u[IU]/mL (ref 0.350–4.500)

## 2022-11-20 NOTE — Telephone Encounter (Signed)
Please review.  KP

## 2022-11-20 NOTE — Progress Notes (Signed)
Date:  11/20/2022   Name:  Gina Frazier   DOB:  1946/01/18   MRN:  [id number]   Chief Complaint: Fatigue (X6 days, negative covid, Exhausted,Tired,No energy)  HPI Whitlee Leadingham presents today joined by her daughter (Dr. Tresa Endo, MD) for evaluation of extreme fatigue for the last 6 days.  Last comprehensive lab panel performed 07/08/2022 showing stable CKD 3a without anemia, normal iron panel, vit D, B12, and TSH.  More recent BMP with [facility] 11/04/2022 also normal aside from the stable CKD 3a.   Her daughter is very worried about her and insists this is a significant change from her baseline. They have recently moved which put a lot of stress on both of them. Patient has been sleeping excessively and lacks the energy/motivation to perform some of their basic household tasks.  Appetite and bowel habits are preserved.  No dysuria, frequency, urgency.  Reclast first dose 10 days ago, but was fine for several days after - unsure if related.   Medication list has been reviewed and updated.  Current Meds  Medication Sig   acetaminophen (TYLENOL) 500 MG tablet Take 1,000 mg every 8 (eight) hours as needed by mouth for mild pain.   albuterol (PROVENTIL) (2.5 MG/3ML) 0.083% nebulizer solution Take 3 mLs (2.5 mg total) by nebulization every 6 (six) hours as needed for wheezing or shortness of breath.   amLODipine (NORVASC) 10 MG tablet TAKE 1/2 TABLET DAILY. AFTER ONE WEEK IF BLOOD PRESSURE STILL>140, INCREASE UP TO 1 TABLET DAILY   apixaban (ELIQUIS) 5 MG TABS tablet Take 1 tablet (5 mg total) by mouth 2 (two) times daily.   Baclofen 5 MG TABS Take 1 tablet (5 mg total) by mouth 3 (three) times daily as needed (for back tension). May cause drowsiness/sedation.   Calcium Carbonate-Vitamin D 600-400 MG-UNIT tablet Take by mouth.   Cholecalciferol (VITAMIN D) 2000 units tablet Take 2,000 Units by mouth daily.   doxazosin (CARDURA) 2 MG tablet Take 1 tablet (2 mg total) by mouth 2 (two) times  daily.   flecainide (TAMBOCOR) 50 MG tablet TAKE 1 TABLET(50 MG) BY MOUTH TWICE DAILY   Fluticasone-Umeclidin-Vilant (TRELEGY ELLIPTA) 200-62.5-25 MCG/ACT AEPB Inhale 1 puff into the lungs daily.   hydrALAZINE (APRESOLINE) 25 MG tablet Take 1 tablet (25 mg total) by mouth 2 (two) times daily as needed (For pressure >160, take  hydralazine 25 mg x 1 If pressure does not come down after 2 hours, take an additional hydralazine).   ipratropium (ATROVENT) 0.06 % nasal spray Place 2 sprays into both nostrils 3 (three) times daily.   lidocaine 4 % Place 1 patch onto the skin daily. Apply locally to lower back   memantine (NAMENDA) 5 MG tablet Take 5 mg by mouth 2 (two) times daily.   methocarbamol (ROBAXIN) 500 MG tablet Take 500 mg by mouth 2 (two) times daily.   Multiple Vitamins-Minerals (MULTIVITAMIN WITH MINERALS) tablet Take 1 tablet by mouth daily.   nitroGLYCERIN (NITROSTAT) 0.4 MG SL tablet Place 1 tablet (0.4 mg total) under the tongue every 5 (five) minutes as needed for chest pain.   propranolol ER (INDERAL LA) 60 MG 24 hr capsule TAKE 1 CAPSULE(60 MG) BY MOUTH DAILY   rosuvastatin (CRESTOR) 5 MG tablet Take 1 tablet (5 mg total) by mouth every Monday, Wednesday, and Friday.   sodium chloride (OCEAN) 0.65 % SOLN nasal spray Place 1-2 sprays into both nostrils daily as needed for congestion.   telmisartan (MICARDIS) 40 MG tablet  TAKE 60 MG(1 AND 1/2 TABLET) NIGHTLY   traZODone (DESYREL) 50 MG tablet Take 0.5-1 tablets (25-50 mg total) by mouth at bedtime as needed for sleep.   VENTOLIN HFA 108 (90 Base) MCG/ACT inhaler INHALE 1 TO 2 PUFFS INTO THE LUNGS EVERY 6 HOURS AS NEEDED FOR WHEEZING OR SHORTNESS OF BREATH   Zoledronic Acid (RECLAST IV) Inject into the vein.   [DISCONTINUED] alendronate (FOSAMAX) 70 MG tablet Take 1 tablet (70 mg total) by mouth every 7 (seven) days. Take with a full glass of water on an empty stomach.     Review of Systems  Constitutional:  Positive for fatigue.  Negative for appetite change and fever.  Respiratory:  Negative for chest tightness and shortness of breath.   Cardiovascular:  Negative for chest pain and palpitations.  Gastrointestinal:  Negative for abdominal pain.    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   Osteoporosis 07/29/2017   Orthostatic hypotension 07/05/2017   Bradycardia 07/05/2017   Essential tremor 03/31/2017   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   Chronic bilateral low back pain 12/12/2015   PAF (paroxysmal atrial fibrillation) (HCC)    Primary osteoarthritis of knee 05/02/2015   Hypertension 07/21/2010   Vitamin D deficiency 07/21/2010    Allergies  Allergen Reactions   Fish Allergy Anaphylaxis, Nausea And Vomiting and Rash   Iodinated Contrast Media Diarrhea   Pravastatin     Diarrhea/constipation per pt      Immunization History  Administered Date(s) Administered   Fluad Quad(high Dose 65+) 11/10/2018, 12/11/2019, 12/05/2020, 11/21/2021   Hepatitis B 03/16/2014, 10/11/2014, 11/25/2014, 07/29/2015   Hepatitis B, ADULT 03/16/2014, 10/11/2014, 11/25/2014, 07/29/2015   Influenza Split 01/05/2011, 12/23/2011, 01/06/2013   Influenza Whole 04/19/2010   Influenza, High Dose Seasonal PF 12/17/2015, 02/12/2017, 12/03/2017   Moderna Covid-19 Vaccine Bivalent Booster 30yrs & up 01/23/2021   Moderna SARS-COV2 Booster Vaccination  02/15/2020   Moderna Sars-Covid-2 Vaccination 04/28/2019, 05/27/2019   Pneumococcal Conjugate-13 09/29/2013, 01/28/2017   Pneumococcal Polysaccharide-23 07/21/2010   Td 04/19/2010   Tdap 01/06/2013   Zoster, Live 07/31/2013    Past Surgical History:  Procedure Laterality Date   CHOLECYSTECTOMY     COLONOSCOPY WITH PROPOFOL N/A 11/05/2014   Procedure: COLONOSCOPY WITH PROPOFOL;  Surgeon: Midge Minium, MD;  Location: [facility];  Service: Endoscopy;  Laterality: N/A;   COLONOSCOPY WITH PROPOFOL N/A 06/05/2021   Procedure: COLONOSCOPY WITH PROPOFOL;  Surgeon: Jaynie Collins, DO;  Location: [facility];  Service: Gastroenterology;  Laterality: N/A;  ELIQUIS   ESOPHAGOGASTRODUODENOSCOPY (EGD) WITH PROPOFOL N/A 06/05/2021   Procedure: ESOPHAGOGASTRODUODENOSCOPY (EGD) WITH PROPOFOL;  Surgeon: Jaynie Collins, DO;  Location: [facility]COPY;  Service: Gastroenterology;  Laterality: N/A;   JOINT REPLACEMENT     left knee x 2 04/2024 and 01/2017 Dr. 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   Procedure: TOTAL KNEE ARTHROPLASTY;  Surgeon: Kennedy Bucker, MD;  Location: [facility];  Service: Orthopedics;  Laterality: Left;   TOTAL KNEE REVISION Left 02/11/2017   Procedure: TOTAL KNEE REVISION, POLYETHELENE EXCHANGE;  Surgeon: Kennedy Bucker, MD;  Location: [facility];  Service: Orthopedics;  Laterality: Left;    Social History   Tobacco Use   Smoking status: Never   Smokeless tobacco: Never  Vaping Use   Vaping status: Never Used  Substance Use Topics   Alcohol use: No   Drug use: No    Family History  Problem Relation Age of Onset   Heart disease Mother    Hypertension Mother    Mental illness Mother    Diabetes Mother    Arthritis Mother     Alzheimer's disease Mother    Heart disease Father    Hypertension Sister    Depression Sister    Diabetes Sister    Breast cancer Sister 14   Bladder Cancer Sister    Cancer Sister    Early death Sister    Heart disease Brother    Hypertension Brother    Cancer Brother    Arthritis Daughter    GER disease Daughter    Hypothyroidism Daughter    Lupus Daughter    Depression Daughter    Parkinson's disease Daughter    Asthma Daughter    ADD / ADHD Daughter    Depression Daughter    Learning disabilities Daughter    Anxiety disorder Daughter    Asthma Daughter    Cancer Daughter    Miscarriages / Stillbirths Daughter    Cancer Other        colon cancer, breast cancer    Heart disease Other    Breast cancer Other    Depression Other         11/20/2022    1:37 PM 10/12/2022    1:23 PM 10/01/2022    9:32 AM 08/13/2022    9:59 AM  GAD 7 : Generalized Anxiety Score  Nervous, Anxious, on Edge 2 2 2 1   Control/stop worrying 1 1 2 1   Worry too much - different things 1 1 2 1   Trouble relaxing 1 1 1 1   Restless 0 0 0 1  Easily annoyed or irritable 2 2 1 2   Afraid - awful might happen 1 1 0 1  Total GAD 7 Score 8 8 8 8   Anxiety Difficulty Somewhat difficult Somewhat difficult Somewhat difficult Very difficult       11/20/2022    1:37 PM 10/12/2022    1:23 PM 10/01/2022    9:31 AM  Depression screen PHQ 2/9  Decreased Interest  0 0  Down, Depressed, Hopeless 1 1 2   PHQ - 2 Score 1 1 2   Altered sleeping 1 1 2   Tired, decreased energy 3 1 2   Change in appetite 0 0 0  Feeling bad or failure about yourself  0 0 0  Trouble concentrating 0 0 3  Moving slowly or fidgety/restless 0 0 0  Suicidal thoughts 0 0 0  PHQ-9 Score 5 3 9   Difficult doing work/chores Somewhat difficult Somewhat difficult Somewhat difficult    BP Readings from Last 3 Encounters:  11/20/22 124/62  10/12/22 112/60  10/01/22 (!) 152/66    Wt Readings from Last 3 Encounters:  11/20/22 144 lb (65.3 kg)   10/12/22 147 lb (66.7 kg)  10/01/22 144 lb (65.3 kg)    BP 124/62   Pulse (!) 52   Temp (!) 97.3 F (36.3 C) (Oral)   Ht 5\' 2"  (1.575 m)   Wt 144 lb (65.3 kg)   SpO2 99%   BMI 26.34 kg/m   Physical Exam Vitals and nursing note reviewed.  Constitutional:      Appearance: Normal appearance.  Neck:     Vascular: No carotid bruit.  Cardiovascular:     Rate and Rhythm: Normal rate and regular rhythm.     Heart sounds: No murmur heard.    No friction rub. No gallop.  Pulmonary:     Effort: Pulmonary effort is normal.     Breath sounds: Normal breath sounds.  Abdominal:     General: There is no distension.  Musculoskeletal:        General: Normal range of motion.  Skin:    General: Skin is warm and dry.  Neurological:     Mental Status: She is alert and oriented to person, place, and time.     Gait: Gait is intact.  Psychiatric:        Mood and Affect: Mood and affect normal.      Recent Labs     Component Value Date/Time   NA 135 11/20/2022 1419   K 4.0 11/20/2022 1419   CL 101 11/20/2022 1419   CO2 26 11/20/2022 1419   GLUCOSE 100 (H) 11/20/2022 1419   BUN 21 11/20/2022 1419   CREATININE 1.07 (H) 11/20/2022 1419   CREATININE 0.97 01/10/2021 1430   CALCIUM 8.9 11/20/2022 1419   PROT 7.4 11/20/2022 1419   ALBUMIN 4.2 11/20/2022 1419   AST 20 11/20/2022 1419   ALT 13 11/20/2022 1419   ALKPHOS 74 11/20/2022 1419   BILITOT 0.8 11/20/2022 1419   GFRNONAA 53 (L) 11/20/2022 1419   GFRAA >60 07/23/2019 0114    Lab Results  Component Value Date   WBC 6.0 11/20/2022   HGB 10.7 (L) 11/20/2022   HCT 32.7 (L) 11/20/2022   MCV 96.2 11/20/2022   PLT 196 11/20/2022   Lab Results  Component Value Date   HGBA1C 5.9 07/08/2022   Lab Results  Component Value Date   CHOL 229 (H) 07/08/2022   HDL 75.90 07/08/2022   LDLCALC 138 (H) 07/08/2022   TRIG 75.0 07/08/2022   CHOLHDL 3 07/08/2022   Lab Results  Component Value Date   TSH 2.78 07/08/2022      Assessment and Plan:  1. Lethargy Patient's daughter very concerned.  Urine dipstick clear today.  Stat CBC and CMP mostly normal aside from mild anemia with hemoglobin 10.7.  Other labs pending.  Consider nonphysical etiology including exacerbation of depression and/or known dementia with recent life events.  - POCT urinalysis dipstick - CBC with Differential/Platelet - Comprehensive metabolic panel - TSH - VITAMIN D 25 Hydroxy (Vit-D Deficiency, Fractures) - B12 and Folate Panel  2. Weakness generalized Plan as above - POCT urinalysis dipstick - CBC with Differential/Platelet - Comprehensive metabolic panel - TSH - VITAMIN D 25 Hydroxy (Vit-D Deficiency, Fractures) - B12 and Folate Panel  3. Vitamin D deficiency Check vitamin D - VITAMIN D 25 Hydroxy (Vit-D Deficiency, Fractures)  4. Encounter for vitamin deficiency screening Check for vitamin deficiencies as below - VITAMIN D 25 Hydroxy (Vit-D Deficiency, Fractures) - B12 and Folate Panel  5. Chronic renal insufficiency, stage 3 (moderate) (HCC) Stable - CBC with Differential/Platelet - Comprehensive metabolic panel - VITAMIN D 25  Hydroxy (Vit-D Deficiency, Fractures)     F/u TBD pending labs.  Currently scheduled for November.   Alvester Morin, PA-C, DMSc, Nutritionist [facility] [phone]

## 2022-11-20 NOTE — Patient Instructions (Addendum)
-

## 2022-11-22 ENCOUNTER — Encounter: Payer: Self-pay | Admitting: Cardiovascular Disease

## 2022-11-23 ENCOUNTER — Other Ambulatory Visit: Payer: Self-pay | Admitting: Physician Assistant

## 2022-11-23 DIAGNOSIS — D649 Anemia, unspecified: Secondary | ICD-10-CM

## 2022-11-23 NOTE — Progress Notes (Signed)
Pt had labs done at the downstairs lab. Labs can't be added when our pt go there.  KP

## 2022-11-25 NOTE — Telephone Encounter (Signed)
Please review.  KP

## 2022-11-25 NOTE — Telephone Encounter (Signed)
Response.  KP

## 2022-11-26 NOTE — Telephone Encounter (Signed)
Please advise 

## 2022-11-26 NOTE — Telephone Encounter (Signed)
fyi

## 2022-11-27 ENCOUNTER — Other Ambulatory Visit: Payer: Self-pay | Admitting: Nurse Practitioner

## 2022-11-30 ENCOUNTER — Encounter: Payer: Self-pay | Admitting: Nurse Practitioner

## 2022-12-01 ENCOUNTER — Other Ambulatory Visit: Payer: Self-pay | Admitting: Physician Assistant

## 2022-12-01 DIAGNOSIS — G309 Alzheimer's disease, unspecified: Secondary | ICD-10-CM

## 2022-12-01 DIAGNOSIS — F02A4 Dementia in other diseases classified elsewhere, mild, with anxiety: Secondary | ICD-10-CM

## 2022-12-01 NOTE — Telephone Encounter (Signed)
Duplicate   KP

## 2022-12-01 NOTE — Telephone Encounter (Signed)
FYI  KP

## 2022-12-01 NOTE — Telephone Encounter (Signed)
Please review.  KP

## 2022-12-02 DIAGNOSIS — R5383 Other fatigue: Secondary | ICD-10-CM | POA: Diagnosis not present

## 2022-12-02 DIAGNOSIS — N183 Chronic kidney disease, stage 3 unspecified: Secondary | ICD-10-CM | POA: Diagnosis not present

## 2022-12-02 DIAGNOSIS — R531 Weakness: Secondary | ICD-10-CM | POA: Diagnosis not present

## 2022-12-02 DIAGNOSIS — E559 Vitamin D deficiency, unspecified: Secondary | ICD-10-CM | POA: Diagnosis not present

## 2022-12-02 NOTE — Telephone Encounter (Signed)
Noted  

## 2022-12-03 ENCOUNTER — Telehealth: Payer: Self-pay | Admitting: Physician Assistant

## 2022-12-03 LAB — CBC WITH DIFFERENTIAL/PLATELET
Basophils Absolute: 0 10*3/uL (ref 0.0–0.2)
Basos: 1 %
EOS (ABSOLUTE): 0.1 10*3/uL (ref 0.0–0.4)
Eos: 2 %
Hematocrit: 36.7 % (ref 34.0–46.6)
Hemoglobin: 12 g/dL (ref 11.1–15.9)
Immature Grans (Abs): 0 10*3/uL (ref 0.0–0.1)
Immature Granulocytes: 0 %
Lymphocytes Absolute: 1.4 10*3/uL (ref 0.7–3.1)
Lymphs: 22 %
MCH: 30.9 pg (ref 26.6–33.0)
MCHC: 32.7 g/dL (ref 31.5–35.7)
MCV: 95 fL (ref 79–97)
Monocytes Absolute: 0.3 10*3/uL (ref 0.1–0.9)
Monocytes: 5 %
Neutrophils Absolute: 4.5 10*3/uL (ref 1.4–7.0)
Neutrophils: 70 %
Platelets: 215 10*3/uL (ref 150–450)
RBC: 3.88 x10E6/uL (ref 3.77–5.28)
RDW: 12.4 % (ref 11.7–15.4)
WBC: 6.5 10*3/uL (ref 3.4–10.8)

## 2022-12-03 LAB — B12 AND FOLATE PANEL
Folate: 16.9 ng/mL (ref >3.0)
Vitamin B-12: 1491 pg/mL — ABNORMAL HIGH (ref 232–1245)

## 2022-12-03 LAB — COMPREHENSIVE METABOLIC PANEL
ALT: 9 IU/L (ref 0–32)
AST: 16 IU/L (ref 0–40)
Albumin: 4.4 g/dL (ref 3.8–4.8)
Alkaline Phosphatase: 105 IU/L (ref 44–121)
BUN/Creatinine Ratio: 27 (ref 12–28)
BUN: 27 mg/dL (ref 8–27)
Bilirubin Total: 0.7 mg/dL (ref 0.0–1.2)
CO2: 25 mmol/L (ref 20–29)
Calcium: 9 mg/dL (ref 8.7–10.3)
Chloride: 102 mmol/L (ref 96–106)
Creatinine, Ser: 0.99 mg/dL (ref 0.57–1.00)
Globulin, Total: 2.5 g/dL (ref 1.5–4.5)
Glucose: 93 mg/dL (ref 70–99)
Potassium: 4.4 mmol/L (ref 3.5–5.2)
Sodium: 140 mmol/L (ref 134–144)
Total Protein: 6.9 g/dL (ref 6.0–8.5)
eGFR: 58 mL/min/{1.73_m2} — ABNORMAL LOW (ref >59)

## 2022-12-03 LAB — TSH: TSH: 4.13 u[IU]/mL (ref 0.450–4.500)

## 2022-12-03 LAB — VITAMIN D 25 HYDROXY (VIT D DEFICIENCY, FRACTURES): Vit D, 25-Hydroxy: 54.2 ng/mL (ref 30.0–100.0)

## 2022-12-03 NOTE — Telephone Encounter (Signed)
Spoke to [place] gave her a verbal. She verbalized understanding.  KP

## 2022-12-03 NOTE — Telephone Encounter (Unsigned)
Copied from CRM [id number]. Topic: Quick Communication - Home Health Verbal Orders >> Dec 03, 2022 11:02 AM Macon Large wrote: Caller/Agency: Herbert Seta with [facility] Callback Number: [phone]  Requesting OT/PT/Skilled Nursing/Social Work/Speech Therapy: speech therapy Frequency: evaluation and treatment for dementia and confusion

## 2022-12-04 ENCOUNTER — Telehealth: Payer: Self-pay | Admitting: Physician Assistant

## 2022-12-04 NOTE — Telephone Encounter (Signed)
FYI  KP

## 2022-12-04 NOTE — Telephone Encounter (Signed)
Called Arline Asp was on the phone. Left a message with front desk that it is ok to delay start of home health.  KP

## 2022-12-04 NOTE — Telephone Encounter (Signed)
Copied from CRM [id number]. Topic: General - Other >> Dec 04, 2022  1:45 PM Phill Myron wrote: [name] WITH [facility] Daughter stated patient is moving today ,  reporting delay and start of care for home health... Is this okay?

## 2022-12-08 ENCOUNTER — Telehealth: Payer: Self-pay | Admitting: Physician Assistant

## 2022-12-08 ENCOUNTER — Ambulatory Visit: Payer: Medicare Other | Admitting: Cardiovascular Disease

## 2022-12-08 NOTE — Telephone Encounter (Signed)
Copied from CRM [id number]. Topic: General - Inquiry >> Dec 08, 2022  1:22 PM Marlow Baars wrote: Reason for CRM: Fawn Kirk with [facility] has been trying to contact the patient with no success to schedule speech therapy start of care for home health. She is going to request a new verbal order for speech therapy start of care starting 9/30. Please assist further

## 2022-12-08 NOTE — Telephone Encounter (Signed)
Home Health Verbal Orders - Caller/Agency: Fawn Kirk with [facility] Callback Number: [phone]  Requesting Speech Therapy Frequency: Request new order to start speech therapy on 9/30  Please assist patient further

## 2022-12-09 ENCOUNTER — Ambulatory Visit: Payer: Medicare Other | Admitting: Pulmonary Disease

## 2022-12-09 NOTE — Telephone Encounter (Signed)
Please advise 

## 2022-12-09 NOTE — Telephone Encounter (Signed)
Noted    Will respond to duplicate message.  KP

## 2022-12-14 DIAGNOSIS — G4733 Obstructive sleep apnea (adult) (pediatric): Secondary | ICD-10-CM | POA: Diagnosis not present

## 2022-12-14 NOTE — Telephone Encounter (Signed)
fyi

## 2022-12-24 ENCOUNTER — Ambulatory Visit: Payer: Medicare Other | Admitting: Pulmonary Disease

## 2022-12-24 ENCOUNTER — Encounter: Payer: Self-pay | Admitting: Pulmonary Disease

## 2022-12-24 VITALS — BP 122/78 | HR 55 | Temp 97.9°F | Ht 62.0 in | Wt 145.0 lb

## 2022-12-24 DIAGNOSIS — G4733 Obstructive sleep apnea (adult) (pediatric): Secondary | ICD-10-CM | POA: Diagnosis not present

## 2022-12-24 DIAGNOSIS — Z23 Encounter for immunization: Secondary | ICD-10-CM

## 2022-12-24 DIAGNOSIS — J4530 Mild persistent asthma, uncomplicated: Secondary | ICD-10-CM

## 2022-12-24 MED ORDER — TRELEGY ELLIPTA 200-62.5-25 MCG/ACT IN AEPB: 1.0000 | INHALATION_SPRAY | Freq: Every day | RESPIRATORY_TRACT | 11 refills | Status: AC

## 2022-12-24 NOTE — Patient Instructions (Signed)
Continue using her CPAP as you are doing.  You received your flu vaccine today.  Continue Trelegy 1 puff daily.  Make sure you rinse your mouth well after you use it.  Provided you with a spacer for your inhaler.  We will see you in follow-up in 2 to 3 months time with one of our nurse practitioners to follow-up on your CPAP.

## 2022-12-24 NOTE — Progress Notes (Signed)
Subjective:    Patient ID: Gina Frazier, female    DOB: 06/06/1945, 77 y.o.   MRN: [id number]  Patient Care Team: Remo Lipps, PA as PCP - General (Physician Assistant) Antonieta Iba, MD (Cardiology) Salena Saner, MD as Consulting Physician (Pulmonary Disease)  Chief Complaint  Patient presents with   Follow-up    No SOB, wheezing or cough. On CPAP.    HPI 77 year old female never smoker followed for asthma,lung nodules(stable on serial CT) and sleep apnea (dx 07/2022). Medical history significant for atrial fibrillation, difficult to control hypertension and mild dementia.  Presents today for follow-up.  Last seen here on 24 August 2022 by Lorenza Evangelist.  That follow-up was for her sleep apnea.  She was started on CPAP therapy.  Her sleep study was completed 04 Aug 2022 showed mild sleep apnea with an AHI of 10/h and SpO2 low at 73%.  O2 baseline was 98%.  She has been using the PAP therapy consistently.  Download shows 97% usage over the last 30 days with 93% over 4 hours.  She is on AutoSet at 5 to 15 cm H2O median pressure at 13 cm H2O residual AHI 1.1.  Patient notes that she is doing well with the CPAP feels rested in the mornings.  She has not had any dyspnea, wheezing nor cough.  Trelegy Ellipta 200 is working well for her asthma.  Rare use of albuterol.  Daughter who accompanies her today does note that she has difficulty with the albuterol inhaler and wonders if a spacer would be helpful.  She does not endorse any other symptomatology.    She will need flu vaccine today.  TEST/EVENTS :  CT chest November 18, 2021: Shows new nodules in the left upper lobe possible infectious versus inflammatory.  Small nodules in the right upper lobe. PFTs May 23, 2021: Showed normal lung function with FEV1 at 94%, ratio 81, FVC 87%, no significant bronchodilator response, DLCO 99%. Echocardiogram 18 September 2022: LVEF 55 to 60%, diastolic parameters normal, mildly elevated  pulmonary artery systolic pressure at 36.5 mmHg, left atrial size moderately dilated, mild mitral valve regurgitation, tricuspid regurgitation moderate Home sleep study Aug 04, 2022: AHI 10/hour SpO2 low 73%-time <88% was 3% baseline O2 sat 98%   Review of Systems A 10 point review of systems was performed and it is as noted above otherwise negative.   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   Osteoporosis 07/29/2017   Orthostatic hypotension 07/05/2017   Bradycardia 07/05/2017   Essential tremor 03/31/2017   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   Chronic bilateral low back pain 12/12/2015   PAF (paroxysmal atrial fibrillation) (HCC)    Primary osteoarthritis of knee 05/02/2015   Hypertension 07/21/2010   Vitamin D deficiency 07/21/2010    Social History   Tobacco Use   Smoking status: Never   Smokeless tobacco: Never  Substance Use Topics   Alcohol use: No  Allergies  Allergen Reactions   Fish Allergy Anaphylaxis, Nausea And Vomiting and Rash   Iodinated Contrast Media Diarrhea   Pravastatin     Diarrhea/constipation per pt      Current Meds  Medication Sig   acetaminophen (TYLENOL) 500 MG tablet Take 1,000 mg every 8 (eight) hours as needed by mouth for mild pain.   albuterol (PROVENTIL) (2.5 MG/3ML) 0.083% nebulizer solution Take 3 mLs (2.5 mg total) by nebulization every 6 (six)  hours as needed for wheezing or shortness of breath.   amLODipine (NORVASC) 10 MG tablet TAKE 1/2 TABLET DAILY. AFTER ONE WEEK IF BLOOD PRESSURE STILL>140, INCREASE UP TO 1 TABLET DAILY   apixaban (ELIQUIS) 5 MG TABS tablet Take 1 tablet (5 mg total) by mouth 2 (two) times daily.   Baclofen 5 MG TABS Take 1 tablet (5 mg total) by mouth 3 (three) times daily as needed (for back tension). May cause drowsiness/sedation.   Calcium Carbonate-Vitamin D 600-400 MG-UNIT tablet Take by mouth.   Cholecalciferol (VITAMIN D) 2000 units tablet Take 2,000 Units by mouth daily.   flecainide (TAMBOCOR) 50 MG tablet TAKE 1 TABLET(50 MG) BY MOUTH TWICE DAILY   Fluticasone-Umeclidin-Vilant (TRELEGY ELLIPTA) 200-62.5-25 MCG/ACT AEPB Inhale 1 puff into the lungs daily.   hydrALAZINE (APRESOLINE) 25 MG tablet Take 1 tablet (25 mg total) by mouth 2 (two) times daily as needed (For pressure >160, take  hydralazine 25 mg x 1 If pressure does not come down after 2 hours, take an additional hydralazine).   ipratropium (ATROVENT) 0.06 % nasal spray Place 2 sprays into both nostrils 3 (three) times daily.   lidocaine 4 % Place 1 patch onto the skin daily. Apply locally to lower back   memantine (NAMENDA) 5 MG tablet Take 5 mg by mouth 2 (two) times daily.   methocarbamol (ROBAXIN) 500 MG tablet Take 500 mg by mouth 2 (two) times daily.   Multiple Vitamins-Minerals (MULTIVITAMIN WITH MINERALS) tablet Take 1 tablet by mouth daily.   nitroGLYCERIN (NITROSTAT) 0.4 MG SL tablet Place 1 tablet (0.4 mg total) under the tongue every 5 (five) minutes as needed for chest pain.   propranolol ER (INDERAL LA) 60 MG 24 hr capsule TAKE 1 CAPSULE(60 MG) BY MOUTH DAILY   rosuvastatin (CRESTOR) 5 MG tablet Take 1 tablet (5 mg total) by mouth every Monday, Wednesday, and Friday.   sodium chloride (OCEAN) 0.65 % SOLN nasal spray Place 1-2 sprays into both nostrils daily as needed for congestion.   telmisartan (MICARDIS) 40 MG tablet TAKE 60 MG(1  AND 1/2 TABLET) NIGHTLY   traZODone (DESYREL) 50 MG tablet TAKE 1/2 TO 1 TABLET(25 TO 50 MG) BY MOUTH AT BEDTIME AS NEEDED FOR SLEEP   VENTOLIN HFA 108 (90 Base) MCG/ACT inhaler INHALE 1 TO 2 PUFFS INTO THE LUNGS EVERY 6 HOURS AS NEEDED FOR WHEEZING OR SHORTNESS OF BREATH   Zoledronic Acid (RECLAST IV) Inject into the vein.    Immunization History  Administered Date(s) Administered   Fluad Quad(high Dose 65+) 11/10/2018, 12/11/2019, 12/05/2020, 11/21/2021   Hepatitis B 03/16/2014, 10/11/2014, 11/25/2014, 07/29/2015   Hepatitis B, ADULT 03/16/2014, 10/11/2014, 11/25/2014, 07/29/2015   Influenza Split 01/05/2011, 12/23/2011, 01/06/2013   Influenza Whole 04/19/2010   Influenza, High Dose Seasonal PF 12/17/2015, 02/12/2017, 12/03/2017   Moderna Covid-19 Vaccine Bivalent Booster 45yrs & up 01/23/2021   Moderna SARS-COV2 Booster Vaccination 02/15/2020   Moderna Sars-Covid-2 Vaccination 04/28/2019, 05/27/2019   Pneumococcal Conjugate-13 09/29/2013, 01/28/2017   Pneumococcal Polysaccharide-23 07/21/2010   Td 04/19/2010  Tdap 01/06/2013   Zoster, Live 07/31/2013        Objective:     BP 122/78 (BP Location: Right Arm, Cuff Size: Normal)   Pulse (!) 55   SpO2 95%   SpO2: 95 % O2 Device: None (Room air)  GENERAL: Well-developed well-nourished woman, fully ambulatory, no acute distress.  No conversational dyspnea. HEAD: Normocephalic, atraumatic.  EYES: Pupils equal, round, reactive to light.  No scleral icterus.  MOUTH: Dentition intact, oral mucosa moist.  No thrush. NECK: Supple. No thyromegaly. Trachea midline. No JVD.  No adenopathy. PULMONARY: Good air entry bilaterally.  No adventitious sounds. CARDIOVASCULAR: S1 and S2. Regular rate and rhythm.  ABDOMEN: Benign. MUSCULOSKELETAL: No joint deformity, no clubbing, no edema.  No Raynaud's noted today. NEUROLOGIC: Mild resting tremor (essential) no other focality. SKIN: Intact,warm,dry.  On limited exam, no rashes.   PSYCH:  Mood and behavior normal.     Assessment & Plan:     ICD-10-CM   1. Mild persistent asthma, unspecified whether complicated  J45.30    Continue Trelegy 200 Continue as needed albuterol Patient provided with spacer for albuterol MDI    2. OSA (obstructive sleep apnea)  G47.33    On auto CPAP 5 to 15 cm H2O Patient compliant Notes benefit of therapy    3. Need for immunization against influenza  Z23 Flu Vaccine Trivalent High Dose (Fluad)   Received flu vaccine today.      Orders Placed This Encounter  Procedures   Flu Vaccine Trivalent High Dose (Fluad)    Meds ordered this encounter  Medications   Fluticasone-Umeclidin-Vilant (TRELEGY ELLIPTA) 200-62.5-25 MCG/ACT AEPB    Sig: Inhale 1 puff into the lungs daily.    Dispense:  1 each    Refill:  11   Overall Jolie is doing very well.  Will see her in follow-up in 2 to 3 months time.  At that time she may see one of our nurse practitioners for follow-up on CPAP.   Gailen Shelter, MD Advanced Bronchoscopy PCCM [facility]    *This note was dictated using voice recognition software/Dragon.  Despite best efforts to proofread, errors can occur which can change the meaning. Any transcriptional errors that result from this process are unintentional and may not be fully corrected at the time of dictation.

## 2022-12-27 ENCOUNTER — Other Ambulatory Visit: Payer: Self-pay | Admitting: Cardiovascular Disease

## 2022-12-27 DIAGNOSIS — I480 Paroxysmal atrial fibrillation: Secondary | ICD-10-CM

## 2022-12-28 ENCOUNTER — Other Ambulatory Visit: Payer: Self-pay | Admitting: Cardiovascular Disease

## 2022-12-28 DIAGNOSIS — I480 Paroxysmal atrial fibrillation: Secondary | ICD-10-CM

## 2022-12-28 NOTE — Telephone Encounter (Signed)
Please review

## 2022-12-28 NOTE — Telephone Encounter (Signed)
Prescription refill request for Eliquis received. Indication:afib Last office visit:7/24 Scr:0.99  9/24 Age: 77 Weight:65.8  kg  Prescription refilled

## 2022-12-28 NOTE — Telephone Encounter (Signed)
Refill sent today at 608am to same pharmacy and confirmation receipt received. This is a duplicate.

## 2023-01-01 ENCOUNTER — Other Ambulatory Visit: Payer: Self-pay | Admitting: Physician Assistant

## 2023-01-01 DIAGNOSIS — I700 Atherosclerosis of aorta: Secondary | ICD-10-CM

## 2023-01-01 DIAGNOSIS — E7800 Pure hypercholesterolemia, unspecified: Secondary | ICD-10-CM

## 2023-01-01 NOTE — Telephone Encounter (Signed)
Requested Prescriptions  Refused Prescriptions Disp Refills   rosuvastatin (CRESTOR) 5 MG tablet [Pharmacy Med Name: ROSUVASTATIN 5MG  TABLETS] 42 tablet     Sig: TAKE 1 TABLET BY MOUTH ONCE EVERY MONDAY,WEDNESDAY, AND FRIDAY     Cardiovascular:  Antilipid - Statins 2 Failed - 01/01/2023  7:59 AM      Failed - Lipid Panel in normal range within the last 12 months    Cholesterol  Date Value Ref Range Status  07/08/2022 229 (H) 0 - 200 mg/dL Final    Comment:    ATP III Classification       Desirable:  < 200 mg/dL               Borderline High:  200 - 239 mg/dL          High:  > = 409 mg/dL   LDL Cholesterol (Calc)  Date Value Ref Range Status  01/10/2021 106 (H) mg/dL (calc) Final    Comment:    Reference range: <100 . Desirable range <100 mg/dL for primary prevention;   <70 mg/dL for patients with CHD or diabetic patients  with > or = 2 CHD risk factors. Marland Kitchen LDL-C is now calculated using the Martin-Hopkins  calculation, which is a validated novel method providing  better accuracy than the Friedewald equation in the  estimation of LDL-C.  Horald Pollen et al. Lenox Ahr. 8119;147(82): 2061-2068  (http://education.[facility].com/faq/FAQ164)    LDL Cholesterol  Date Value Ref Range Status  07/08/2022 138 (H) 0 - 99 mg/dL Final   HDL  Date Value Ref Range Status  07/08/2022 75.90 >39.00 mg/dL Final   Triglycerides  Date Value Ref Range Status  07/08/2022 75.0 0.0 - 149.0 mg/dL Final    Comment:    Normal:  <150 mg/dLBorderline High:  150 - 199 mg/dL         Passed - Cr in normal range and within 360 days    Creat  Date Value Ref Range Status  01/10/2021 0.97 0.60 - 1.00 mg/dL Final   Creatinine, Ser  Date Value Ref Range Status  12/02/2022 0.99 0.57 - 1.00 mg/dL Final         Passed - Patient is not pregnant      Passed - Valid encounter within last 12 months    Recent Outpatient Visits           1 month ago Lethargy   [facility]  at [facility], [place], [place]   2 months ago Acute right-sided low back pain with right-sided sciatica   [facility] at [facility], [place], [place]   3 months ago Encounter to establish care   [facility] at [facility], [place], [place]

## 2023-01-14 DIAGNOSIS — G4733 Obstructive sleep apnea (adult) (pediatric): Secondary | ICD-10-CM | POA: Diagnosis not present

## 2023-01-19 ENCOUNTER — Telehealth: Payer: Self-pay | Admitting: Cardiovascular Disease

## 2023-01-19 DIAGNOSIS — I480 Paroxysmal atrial fibrillation: Secondary | ICD-10-CM

## 2023-01-19 DIAGNOSIS — I4891 Unspecified atrial fibrillation: Secondary | ICD-10-CM

## 2023-01-19 DIAGNOSIS — I10 Essential (primary) hypertension: Secondary | ICD-10-CM

## 2023-01-19 DIAGNOSIS — G250 Essential tremor: Secondary | ICD-10-CM

## 2023-01-19 NOTE — Telephone Encounter (Signed)
Attempted to contact pt. Received a message stating your call can not be completed as dialed.

## 2023-01-19 NOTE — Telephone Encounter (Signed)
*  STAT* If patient is at the pharmacy, call can be transferred to refill team.   1. Which medications need to be refilled? (please list name of each medication and dose if known)   propranolol ER (INDERAL LA) 60 MG 24 hr capsule    2. Which pharmacy/location (including street and city if local pharmacy) is medication to be sent to? [facility] [place] [address] [phone]  3. Do they need a 30 day or 90 day supply? 90

## 2023-01-25 ENCOUNTER — Encounter: Payer: Self-pay | Admitting: Cardiovascular Disease

## 2023-01-25 NOTE — Telephone Encounter (Signed)
Attempted to contact patient, unable to reach, LVM to call back if medication refill was still needed.   Left call back number.

## 2023-01-27 ENCOUNTER — Telehealth: Payer: Self-pay | Admitting: Cardiovascular Disease

## 2023-01-27 MED ORDER — PROPRANOLOL HCL ER 60 MG PO CP24: ORAL_CAPSULE | ORAL | 0 refills | Status: AC

## 2023-01-27 NOTE — Telephone Encounter (Signed)
Spoke to patient's daughter to inform her that we did get her message and a 30 day supply of her cardiac medication was sent to a pharmacy in [place]. Patient stated that's fine but she doesn't have contact with her mother anymore and that she doesn't want to be called anymore in regards to her care because she no longer lives with her.

## 2023-01-27 NOTE — Telephone Encounter (Signed)
A 30 day supply for propranolol sent to requesting pharmacy. Per [employer] message from pt's son, pt has moved to [place].

## 2023-01-27 NOTE — Telephone Encounter (Signed)
Patient's daughter called stating she was returning a call from the office. She requested her number be removed as the patient's contact number.   Number removed, but it has caused a contact number no longer being listed for the patient.

## 2023-01-28 ENCOUNTER — Other Ambulatory Visit: Payer: Self-pay | Admitting: Cardiovascular Disease

## 2023-01-28 DIAGNOSIS — I480 Paroxysmal atrial fibrillation: Secondary | ICD-10-CM

## 2023-01-28 DIAGNOSIS — I4891 Unspecified atrial fibrillation: Secondary | ICD-10-CM

## 2023-01-28 DIAGNOSIS — I10 Essential (primary) hypertension: Secondary | ICD-10-CM

## 2023-01-28 DIAGNOSIS — G250 Essential tremor: Secondary | ICD-10-CM

## 2023-01-31 ENCOUNTER — Encounter: Payer: Self-pay | Admitting: Cardiovascular Disease

## 2023-02-01 ENCOUNTER — Ambulatory Visit: Payer: Medicare Other | Admitting: Physician Assistant

## 2023-02-01 ENCOUNTER — Telehealth: Payer: Self-pay | Admitting: Cardiovascular Disease

## 2023-02-01 NOTE — Telephone Encounter (Signed)
Made contact with Gina Frazier who was able to place me on speaker phone with the patient Gina Frazier.  Gina Frazier reported she has moved to [place] to live with her daughter Gina Frazier and will be transferring all of her medical care to this area.  She shared they have a plan in place and have appointments scheduled for when her insurance in this area will be active starting in 02/2023.  We discussed who the [facility] team should be sharing information with at this time and updated EMR accordingly.    Answered questions about refill requests they placed today that came from her daughter Gina Frazier, not the granddaughter as was listed in the message the clinic received.     Gina Cane RN, BSN, CCRN-K

## 2023-02-01 NOTE — Telephone Encounter (Signed)
Pt c/o medication issue:  1. Name of Medication:   telmisartan (MICARDIS) 40 MG tablet    2. How are you currently taking this medication (dosage and times per day)? As written  3. Are you having a reaction (difficulty breathing--STAT)? No   4. What is your medication issue? Per Granddaughter pharmacy said medication requires prior auth Please advise

## 2023-02-01 NOTE — Telephone Encounter (Signed)
Spoke with the patient's daughter, Warden Fillers, after the patient gave a verbal ok to discuss health information with her.   The patient has relocated to [place]. Warden Fillers advised that the patient's Telmisartan is requiring a Prior Auth due to the quantity of tablets.   The patient is currently taking: Telmisartan 40 mg: - take 1.5 tablets (60 mg) by mouth once daily   The patient's RXs were transferred to [facility] in [place].  Will forward to Prior Auth team to assist with this.

## 2023-02-02 ENCOUNTER — Other Ambulatory Visit (HOSPITAL_COMMUNITY): Payer: Self-pay

## 2023-02-02 ENCOUNTER — Telehealth: Payer: Self-pay | Admitting: Pharmacy Technician

## 2023-02-02 NOTE — Telephone Encounter (Signed)
Art Buff, CPhT  Sent: Tue February 02, 2023 10:37 AM  To: Jefferey Pica, RN         Message  PA request has been Submitted. New Encounter created for follow up. For additional info see Pharmacy Prior Auth telephone encounter from 02/02/23.

## 2023-02-02 NOTE — Telephone Encounter (Signed)
Pharmacy Patient Advocate Encounter   Received notification from Pt Calls Messages that prior authorization for telmisartan is required/requested.   Insurance verification completed.   The patient is insured through [facility] .   Per test claim: PA required; PA submitted to above mentioned insurance via CoverMyMeds Key/confirmation #/EOC [id number] Status is pending

## 2023-02-08 ENCOUNTER — Other Ambulatory Visit: Payer: Self-pay | Admitting: Cardiovascular Disease

## 2023-02-08 ENCOUNTER — Other Ambulatory Visit: Payer: Self-pay | Admitting: *Deleted

## 2023-02-08 ENCOUNTER — Other Ambulatory Visit (HOSPITAL_COMMUNITY): Payer: Self-pay

## 2023-02-08 DIAGNOSIS — J302 Other seasonal allergic rhinitis: Secondary | ICD-10-CM

## 2023-02-08 MED ORDER — TELMISARTAN 40 MG PO TABS: ORAL_TABLET | ORAL | 6 refills | Status: DC

## 2023-02-08 MED ORDER — TELMISARTAN 40 MG PO TABS: ORAL_TABLET | ORAL | 0 refills | Status: AC

## 2023-02-08 NOTE — Telephone Encounter (Signed)
Patient's daughter is requesting to speak with the pharmacist in regards to the Telmisartan medication. Please advise.

## 2023-02-08 NOTE — Telephone Encounter (Signed)
Spoke with patients daughter per release form. Advised that they did submit prior authorization and it is currently pending. She verbalized understanding with no further questions at this time

## 2023-02-08 NOTE — Telephone Encounter (Signed)
Pt's daughter calling back to f/u on Prior Auth for Telmisartan. She states that they are needing it for the 1/2 tablet. She is needing this done as soon asa possible being that pt is out of the medication. Below in the number to the insurance company as well as pt's member number. She would like a c/b regarding this matter asap.  [phone] - option [phone] Member ID - [id number]

## 2023-02-08 NOTE — Telephone Encounter (Signed)
Received call to see if we can send prescription to [facility] in [place]. Prescription was sent and no further needs at this time.

## 2023-02-08 NOTE — Telephone Encounter (Signed)
Received call back to send in only a few pills to pharmacy in [place]. Updated prescription and pharmacy. No further needs

## 2023-02-08 NOTE — Telephone Encounter (Signed)
PA has been submitted to plan, status is pending.

## 2023-02-08 NOTE — Addendum Note (Signed)
Addended by: Bryna Colander on: 02/08/2023 03:49 PM   Modules accepted: Orders

## 2023-02-16 ENCOUNTER — Ambulatory Visit: Payer: Medicare Other | Admitting: Nurse Practitioner

## 2023-02-23 ENCOUNTER — Ambulatory Visit: Payer: Medicare Other | Admitting: Adult Health

## 2023-03-04 ENCOUNTER — Telehealth: Payer: Self-pay | Admitting: Cardiovascular Disease

## 2023-03-04 ENCOUNTER — Telehealth: Payer: Self-pay | Admitting: Pharmacy Technician

## 2023-03-04 ENCOUNTER — Other Ambulatory Visit (HOSPITAL_COMMUNITY): Payer: Self-pay

## 2023-03-04 NOTE — Telephone Encounter (Signed)
Pt c/o medication issue:  1. Name of Medication: telmisartan (MICARDIS) 40 MG tablet   2. How are you currently taking this medication (dosage and times per day)? 1.5 tab daily 60 MG  3. Are you having a reaction (difficulty breathing--STAT)? No   4. What is your medication issue? Per daughter pt is needs a PA done on this med because she now has a new ins   [employer] [id number]

## 2023-03-04 NOTE — Telephone Encounter (Signed)
Pharmacy Patient Advocate Encounter   Received notification from Pt Calls Messages that prior authorization for telmisartan is required/requested.   Insurance verification completed.   The patient is insured through  [employer]  .   Per test claim: PA required; PA submitted to above mentioned insurance via CoverMyMeds Key/confirmation #/EOC [facility] Status is pending

## 2023-03-05 NOTE — Telephone Encounter (Signed)
Pharmacy Patient Advocate Encounter  Received notification from  [employer]  that Prior Authorization for telmisartan has been APPROVED from 02/14/23 to 03/03/24   PA #/Case ID/Reference #: [id number]

## 2023-03-05 NOTE — Telephone Encounter (Signed)
Spoke to patient's daughter and informed her of the following:  "Received notification from  [employer]  that Prior Authorization for telmisartan has been APPROVED from 02/14/23 to 03/03/24    PA #/Case ID/Reference #: [id number]"  Patient's daughter understood with read back

## 2023-06-22 ENCOUNTER — Telehealth: Payer: Self-pay | Admitting: Pulmonary Disease

## 2023-06-22 NOTE — Telephone Encounter (Signed)
 Copied from CRM [id number]. Topic: General - Other >> Jun 21, 2023  5:08 PM Brennan Bailey S wrote: Reason for CRM: patient daughter is calling to request for patient ct chest disk to be mailed to her. Also they need to know where the patient got her cpap machine from because patient needs a new hose and masks, please call at [phone]

## 2023-06-22 NOTE — Telephone Encounter (Signed)
 I spoke with Warden Fillers and gave her the number to the Radiology desk at the hospital to call for the disk. I also told her it was [employer] that her mother gt her CPAP from.  Nothing further needed.
